# Patient Record
Sex: Male | Born: 1945
Health system: Southern US, Community
[De-identification: ages and names within clinical notes are randomized; demographics above are authoritative.]

## PROBLEM LIST (undated history)

## (undated) DIAGNOSIS — I2510 Atherosclerotic heart disease of native coronary artery without angina pectoris: Secondary | ICD-10-CM

## (undated) DIAGNOSIS — R51 Headache: Secondary | ICD-10-CM

## (undated) DIAGNOSIS — F319 Bipolar disorder, unspecified: Secondary | ICD-10-CM

## (undated) DIAGNOSIS — F32A Depression, unspecified: Secondary | ICD-10-CM

## (undated) DIAGNOSIS — J449 Chronic obstructive pulmonary disease, unspecified: Secondary | ICD-10-CM

## (undated) DIAGNOSIS — I519 Heart disease, unspecified: Secondary | ICD-10-CM

## (undated) DIAGNOSIS — R569 Unspecified convulsions: Secondary | ICD-10-CM

## (undated) DIAGNOSIS — I10 Essential (primary) hypertension: Secondary | ICD-10-CM

## (undated) DIAGNOSIS — F329 Major depressive disorder, single episode, unspecified: Secondary | ICD-10-CM

## (undated) DIAGNOSIS — I639 Cerebral infarction, unspecified: Secondary | ICD-10-CM

## (undated) DIAGNOSIS — I4891 Unspecified atrial fibrillation: Secondary | ICD-10-CM

## (undated) DIAGNOSIS — R519 Headache, unspecified: Secondary | ICD-10-CM

## (undated) HISTORY — PX: ANKLE FRACTURE SURGERY: SHX122

## (undated) HISTORY — DX: Major depressive disorder, single episode, unspecified: F32.9

## (undated) HISTORY — DX: Depression, unspecified: F32.A

## (undated) HISTORY — DX: Unspecified convulsions: R56.9

---

## 2001-03-27 ENCOUNTER — Ambulatory Visit (HOSPITAL_COMMUNITY): Admission: RE | Admit: 2001-03-27 | Discharge: 2001-03-27 | Payer: Self-pay | Admitting: Internal Medicine

## 2003-10-06 ENCOUNTER — Ambulatory Visit (HOSPITAL_COMMUNITY): Admission: RE | Admit: 2003-10-06 | Discharge: 2003-10-06 | Payer: Self-pay | Admitting: Orthopaedic Surgery

## 2003-10-19 ENCOUNTER — Ambulatory Visit (HOSPITAL_COMMUNITY): Admission: RE | Admit: 2003-10-19 | Discharge: 2003-10-19 | Payer: Self-pay | Admitting: Podiatry

## 2003-11-23 ENCOUNTER — Ambulatory Visit (HOSPITAL_COMMUNITY): Admission: RE | Admit: 2003-11-23 | Discharge: 2003-11-23 | Payer: Self-pay | Admitting: Podiatry

## 2005-01-16 ENCOUNTER — Ambulatory Visit: Payer: Self-pay | Admitting: *Deleted

## 2005-01-29 ENCOUNTER — Ambulatory Visit: Payer: Self-pay | Admitting: *Deleted

## 2005-01-29 ENCOUNTER — Encounter (HOSPITAL_COMMUNITY): Admission: RE | Admit: 2005-01-29 | Discharge: 2005-02-28 | Payer: Self-pay | Admitting: *Deleted

## 2005-02-04 ENCOUNTER — Ambulatory Visit: Payer: Self-pay | Admitting: *Deleted

## 2005-09-19 ENCOUNTER — Inpatient Hospital Stay (HOSPITAL_COMMUNITY): Admission: EM | Admit: 2005-09-19 | Discharge: 2005-09-25 | Payer: Self-pay | Admitting: Emergency Medicine

## 2005-09-20 ENCOUNTER — Ambulatory Visit: Payer: Self-pay | Admitting: Cardiovascular Disease

## 2005-09-20 ENCOUNTER — Encounter (INDEPENDENT_AMBULATORY_CARE_PROVIDER_SITE_OTHER): Payer: Self-pay | Admitting: Neurology

## 2005-09-20 ENCOUNTER — Ambulatory Visit: Payer: Self-pay | Admitting: Physical Medicine & Rehabilitation

## 2005-09-20 ENCOUNTER — Encounter: Payer: Self-pay | Admitting: Cardiovascular Disease

## 2005-09-25 ENCOUNTER — Inpatient Hospital Stay (HOSPITAL_COMMUNITY)
Admission: RE | Admit: 2005-09-25 | Discharge: 2005-10-04 | Payer: Self-pay | Admitting: Physical Medicine & Rehabilitation

## 2005-11-07 ENCOUNTER — Encounter (HOSPITAL_COMMUNITY): Admission: RE | Admit: 2005-11-07 | Discharge: 2005-12-07 | Payer: Self-pay | Admitting: Pulmonary Disease

## 2005-12-24 ENCOUNTER — Ambulatory Visit: Admission: RE | Admit: 2005-12-24 | Discharge: 2005-12-24 | Payer: Self-pay | Admitting: Pulmonary Disease

## 2006-04-10 ENCOUNTER — Ambulatory Visit (HOSPITAL_COMMUNITY): Admission: RE | Admit: 2006-04-10 | Discharge: 2006-04-10 | Payer: Self-pay | Admitting: Pulmonary Disease

## 2007-04-04 ENCOUNTER — Emergency Department (HOSPITAL_COMMUNITY): Admission: EM | Admit: 2007-04-04 | Discharge: 2007-04-04 | Payer: Self-pay | Admitting: Emergency Medicine

## 2007-10-05 ENCOUNTER — Ambulatory Visit (HOSPITAL_COMMUNITY): Payer: Self-pay | Admitting: Psychology

## 2007-10-15 ENCOUNTER — Ambulatory Visit (HOSPITAL_COMMUNITY): Payer: Self-pay | Admitting: Psychiatry

## 2007-10-19 ENCOUNTER — Ambulatory Visit (HOSPITAL_COMMUNITY): Payer: Self-pay | Admitting: Psychology

## 2007-11-24 ENCOUNTER — Ambulatory Visit (HOSPITAL_COMMUNITY): Payer: Self-pay | Admitting: Psychiatry

## 2007-12-08 ENCOUNTER — Ambulatory Visit (HOSPITAL_COMMUNITY): Payer: Self-pay | Admitting: Psychology

## 2007-12-29 ENCOUNTER — Ambulatory Visit (HOSPITAL_COMMUNITY): Payer: Self-pay | Admitting: Psychology

## 2008-01-21 ENCOUNTER — Ambulatory Visit (HOSPITAL_COMMUNITY): Payer: Self-pay | Admitting: Psychiatry

## 2008-03-17 ENCOUNTER — Ambulatory Visit (HOSPITAL_COMMUNITY): Payer: Self-pay | Admitting: Psychiatry

## 2008-03-24 ENCOUNTER — Ambulatory Visit (HOSPITAL_COMMUNITY): Payer: Self-pay | Admitting: Psychology

## 2008-05-10 ENCOUNTER — Ambulatory Visit (HOSPITAL_COMMUNITY): Payer: Self-pay | Admitting: Psychology

## 2008-06-10 ENCOUNTER — Ambulatory Visit (HOSPITAL_COMMUNITY): Payer: Self-pay | Admitting: Psychology

## 2008-06-16 ENCOUNTER — Ambulatory Visit (HOSPITAL_COMMUNITY): Payer: Self-pay | Admitting: Psychiatry

## 2008-07-25 ENCOUNTER — Ambulatory Visit (HOSPITAL_COMMUNITY): Payer: Self-pay | Admitting: Psychology

## 2008-08-26 ENCOUNTER — Ambulatory Visit (HOSPITAL_COMMUNITY): Payer: Self-pay | Admitting: Psychology

## 2008-09-13 ENCOUNTER — Ambulatory Visit (HOSPITAL_COMMUNITY): Payer: Self-pay | Admitting: Psychiatry

## 2008-11-10 ENCOUNTER — Ambulatory Visit (HOSPITAL_COMMUNITY): Payer: Self-pay | Admitting: Psychiatry

## 2008-11-30 ENCOUNTER — Ambulatory Visit: Payer: Self-pay | Admitting: Cardiology

## 2008-11-30 ENCOUNTER — Inpatient Hospital Stay (HOSPITAL_COMMUNITY): Admission: EM | Admit: 2008-11-30 | Discharge: 2008-12-04 | Payer: Self-pay | Admitting: Emergency Medicine

## 2008-11-30 ENCOUNTER — Encounter (INDEPENDENT_AMBULATORY_CARE_PROVIDER_SITE_OTHER): Payer: Self-pay | Admitting: Pulmonary Disease

## 2009-02-07 ENCOUNTER — Ambulatory Visit (HOSPITAL_COMMUNITY): Payer: Self-pay | Admitting: Psychiatry

## 2009-04-03 ENCOUNTER — Inpatient Hospital Stay (HOSPITAL_COMMUNITY): Admission: EM | Admit: 2009-04-03 | Discharge: 2009-04-05 | Payer: Self-pay | Admitting: Emergency Medicine

## 2009-04-13 ENCOUNTER — Ambulatory Visit (HOSPITAL_COMMUNITY): Payer: Self-pay | Admitting: Psychiatry

## 2009-04-20 DIAGNOSIS — M545 Low back pain: Secondary | ICD-10-CM

## 2009-04-20 DIAGNOSIS — F3162 Bipolar disorder, current episode mixed, moderate: Secondary | ICD-10-CM

## 2009-05-01 ENCOUNTER — Ambulatory Visit: Payer: Self-pay | Admitting: Cardiology

## 2009-05-01 DIAGNOSIS — I951 Orthostatic hypotension: Secondary | ICD-10-CM | POA: Insufficient documentation

## 2009-05-01 DIAGNOSIS — R55 Syncope and collapse: Secondary | ICD-10-CM | POA: Insufficient documentation

## 2009-05-01 DIAGNOSIS — R079 Chest pain, unspecified: Secondary | ICD-10-CM | POA: Insufficient documentation

## 2009-06-16 ENCOUNTER — Encounter: Payer: Self-pay | Admitting: Cardiology

## 2009-06-16 ENCOUNTER — Telehealth (INDEPENDENT_AMBULATORY_CARE_PROVIDER_SITE_OTHER): Payer: Self-pay

## 2009-06-30 ENCOUNTER — Ambulatory Visit (HOSPITAL_COMMUNITY): Admission: RE | Admit: 2009-06-30 | Discharge: 2009-06-30 | Payer: Self-pay | Admitting: Pulmonary Disease

## 2009-07-05 ENCOUNTER — Ambulatory Visit: Payer: Self-pay | Admitting: Gastroenterology

## 2009-07-05 ENCOUNTER — Observation Stay (HOSPITAL_COMMUNITY): Admission: AD | Admit: 2009-07-05 | Discharge: 2009-07-06 | Payer: Self-pay | Admitting: Pulmonary Disease

## 2009-07-05 DIAGNOSIS — D7389 Other diseases of spleen: Secondary | ICD-10-CM | POA: Insufficient documentation

## 2009-07-05 DIAGNOSIS — R6881 Early satiety: Secondary | ICD-10-CM | POA: Insufficient documentation

## 2009-07-05 DIAGNOSIS — I748 Embolism and thrombosis of other arteries: Secondary | ICD-10-CM | POA: Insufficient documentation

## 2009-07-05 DIAGNOSIS — R197 Diarrhea, unspecified: Secondary | ICD-10-CM

## 2009-07-05 DIAGNOSIS — R1012 Left upper quadrant pain: Secondary | ICD-10-CM | POA: Insufficient documentation

## 2009-07-05 DIAGNOSIS — R634 Abnormal weight loss: Secondary | ICD-10-CM

## 2009-07-05 DIAGNOSIS — Z860100 Personal history of colon polyps, unspecified: Secondary | ICD-10-CM | POA: Insufficient documentation

## 2009-07-06 ENCOUNTER — Encounter: Payer: Self-pay | Admitting: Gastroenterology

## 2009-07-12 ENCOUNTER — Emergency Department (HOSPITAL_COMMUNITY): Admission: EM | Admit: 2009-07-12 | Discharge: 2009-07-12 | Payer: Self-pay | Admitting: Emergency Medicine

## 2009-07-13 ENCOUNTER — Encounter: Payer: Self-pay | Admitting: Gastroenterology

## 2009-07-19 ENCOUNTER — Encounter: Payer: Self-pay | Admitting: Cardiology

## 2009-07-19 ENCOUNTER — Telehealth (INDEPENDENT_AMBULATORY_CARE_PROVIDER_SITE_OTHER): Payer: Self-pay

## 2009-07-27 ENCOUNTER — Encounter: Payer: Self-pay | Admitting: Cardiology

## 2009-07-28 LAB — CONVERTED CEMR LAB
ALT: 10 units/L (ref 0–53)
AST: 20 units/L (ref 0–37)
Albumin: 4.3 g/dL (ref 3.5–5.2)
Alkaline Phosphatase: 87 units/L (ref 39–117)
Bilirubin, Direct: 0.1 mg/dL (ref 0.0–0.3)
Cholesterol: 230 mg/dL — ABNORMAL HIGH (ref 0–200)
HDL: 42 mg/dL (ref >39)
Indirect Bilirubin: 0.6 mg/dL (ref 0.0–0.9)
LDL Cholesterol: 159 mg/dL — ABNORMAL HIGH (ref 0–99)
Total Bilirubin: 0.7 mg/dL (ref 0.3–1.2)
Total CHOL/HDL Ratio: 5.5
Total Protein: 7.5 g/dL (ref 6.0–8.3)
Triglycerides: 143 mg/dL (ref <150)
VLDL: 29 mg/dL (ref 0–40)

## 2009-09-01 ENCOUNTER — Emergency Department (HOSPITAL_COMMUNITY): Admission: EM | Admit: 2009-09-01 | Discharge: 2009-09-01 | Payer: Self-pay | Admitting: Emergency Medicine

## 2009-09-14 ENCOUNTER — Ambulatory Visit (HOSPITAL_COMMUNITY): Payer: Self-pay | Admitting: Psychiatry

## 2009-12-14 ENCOUNTER — Ambulatory Visit (HOSPITAL_COMMUNITY): Payer: Self-pay | Admitting: Psychiatry

## 2009-12-15 ENCOUNTER — Encounter (INDEPENDENT_AMBULATORY_CARE_PROVIDER_SITE_OTHER): Payer: Self-pay | Admitting: *Deleted

## 2009-12-18 ENCOUNTER — Encounter (INDEPENDENT_AMBULATORY_CARE_PROVIDER_SITE_OTHER): Payer: Self-pay

## 2010-01-27 ENCOUNTER — Encounter: Payer: Self-pay | Admitting: Internal Medicine

## 2010-02-06 NOTE — Progress Notes (Signed)
Summary: Labs?   Phone Note Outgoing Call   Call placed by: Larita Fife Via LPN,  July 19, 2009 3:40 PM Summary of Call: Second call concerning labwork. Called pt. concerning lab work that was to be done June 13 (rescheduled from April due to pt. not having performed at that time). He states he will have done if I mail lab orders to him again. Lab orders mailed to pt's home.  Initial call taken by: Larita Fife Via LPN,  July 19, 2009 3:53 PM

## 2010-02-06 NOTE — Letter (Signed)
Summary: Appointment - Reminder 2  [facility] [place]. [address], [place] [address]   Phone: [phone]  Fax: [phone]     December 15, 2009 MRN: [id number]   Jeffrey Sweeney [address] [place], [place]  [address]   Dear Mr. Riggins,  Our records indicate that it is time to schedule a follow-up appointment.  Dr.   Diona Browner       recommended that you follow up with [place] in  10/2009 PAST DUE          . It is very important that we reach you to schedule this appointment. We look forward to participating in your health care needs. Please contact us at the number listed above at your earliest convenience to schedule your appointment.  If you are unable to make an appointment at this time, give [place] a call so we can update our records.     Sincerely,   Glass blower/designer

## 2010-02-06 NOTE — Letter (Signed)
Summary: referral from dr ed Juanetta Gosling  referral from dr ed Juanetta Gosling   Imported By: Rosine Beat 07/13/2009 15:15:27  _____________________________________________________________________  External Attachment:    Type:   Image     Comment:   External Document

## 2010-02-06 NOTE — Assessment & Plan Note (Signed)
Summary: abdominal pain- cdg   Visit Type:  consult Referring Provider:  Shaune Pollack Primary Care Provider:  Shaune Pollack  Chief Complaint:  abd pain and no appitite.  History of Present Illness: Jeffrey Sweeney is a pleasant 65 y/o WM, patient of Dr. Juanetta Gosling, who presents for further evaluation of abd pain, diarrhea, weight loss, anorexia. He states he has lost over 80 pounds this year. He c/o chronic anorexia and early satiety. He has two month h/o luq abd pain. This is associated with intermittent luq abd cramps, pp loose stools. No N/V. Recent heartburn. Taking TUMS, Pepto helps temporarily. No melena, brbpr. No fever. Pain intermittent but not necessarily related to meals.  No dyphagia.   Last TCS, 2003 at [facility], had polyps. Remote EGD, Dr. Karilyn Cota.   CT A/P 06/30/09-->nonobstruction bilateral renal calculi, bilateral renal cysts (tiny). Splenic infarct with splenic and right portal vein thrombosis, celiac artery thrombosis including celiac trunk, common hepatic and splenic arteries. Small sized of thrombosed celiac artery s/o subacute occlusion.   Current Medications (verified): 1)  Nardil 15 Mg Tabs (Phenelzine Sulfate) .... Take 2 Tabs Three Times A Day 2)  Simvastatin 20 Mg Tabs (Simvastatin) .... Take 1 Tab Daily 3)  Abilify 10 Mg Tabs (Aripiprazole) .... Take 1 Tab Daily 4)  Lunesta 3 Mg Tabs (Eszopiclone) .... Take 1 Tab At Bedtime 5)  Lortab 5-500 Mg Tabs (Hydrocodone-Acetaminophen) .... Take 1 Tab As Needed For Pain 6)  Alprazolam 1 Mg Tabs (Alprazolam) .... Take As Directed 7)  Midodrine Hcl 10 Mg Tabs (Midodrine Hcl) .... Take 1 Tab Three Times A Day 8)  Asa (?dose) .... One Daily  Allergies (verified): 1)  ! Demerol  Past History:  Past Medical History: Symptomatic orthostatic hypotension Hypertension Cerebrovascular Disease s/p stroke with left hemiparesis Anxiety Depression/Bipolar D/O Chronic back pain, bulging disc Syncope MRI brain 3/11-->Chronic ischemic changes  including a chronic right parietal and temporal lobe infarct.  Past Surgical History: Lipoma excision Ankle surgery, right, fracture Knee surgery, bilateral, ligament tear  Family History: Noncontributory Mother and Father, "intestinal cancer" >13 years old  Social History: Disabled  Tobacco Use - Former Alcohol Use - no Widowed. Five children from three wives.  Review of Systems General:  Complains of weakness and weight loss; denies fever, chills, sweats, anorexia, and fatigue. Eyes:  Denies vision loss. ENT:  Denies nasal congestion, sore throat, hoarseness, and difficulty swallowing. CV:  Denies chest pains, angina, palpitations, dyspnea on exertion, and peripheral edema. Resp:  Denies dyspnea at rest, dyspnea with exercise, cough, sputum, and wheezing. GI:  See HPI. GU:  Denies urinary burning and blood in urine. MS:  Complains of low back pain and muscle weakness. Derm:  Denies rash and itching. Neuro:  Complains of weakness, abnormal sensation, and memory loss; denies frequent headaches and confusion; related to previous cva. Psych:  Complains of depression, anxiety, and memory loss; denies suicidal ideation, hallucinations, and confusion. Endo:  Complains of unusual weight change. Heme:  Denies bruising and bleeding. Allergy:  Denies hives and rash.  Vital Signs:  Patient profile:   65 year old male Height:      68 inches Weight:      202 pounds BMI:     30.83 Temp:     98.6 degrees F oral Pulse rate:   60 / minute BP sitting:   104 / 60  (right arm) Cuff size:   regular  Vitals Entered By: Hendricks Limes LPN (July 05, 2009 10:20 AM)  Physical Exam  General:  Well developed, well nourished, no acute distress. Head:  Normocephalic and atraumatic. Eyes:  sclera nonicteric Mouth:  Oropharyngeal mucosa moist, pink.  No lesions, erythema or exudate.    Neck:  Supple; no masses or thyromegaly. Lungs:  Clear throughout to auscultation. Heart:  Regular rate and  rhythm; no murmurs, rubs,  or bruits. Abdomen:  Obese. Positive bs. Soft. Mod tenderness in epig/luq. No rebound or guarding. No abd hernia. No HSM or masses.  Extremities:  No clubbing, cyanosis, edema or deformities noted. Neurologic:  Alert and  oriented x4;  grossly normal neurologically. Skin:  Intact without significant lesions or rashes. Cervical Nodes:  No significant cervical adenopathy. Psych:  Alert and cooperative. Normal mood and affect.poor memory.    Impression & Recommendations:  Problem # 1:  EMBOLISM&THROMBOSIS OF OTHER SPECIFIED ARTERY (ICD-444.89) Patient has splenic and right portal vein thrombosis along with celiac artery thrombosis involving celiac trunk, common hepatic and splenic arteries. Probably subacute process. Splenic infarct as well. It is likely that his symptoms of weight loss, diarrhea, anorexia, luq pain all explained by these findings. Discussed with Dr. Darrick Penna. Patient needs to be admitted for anticoagulation therapy and hematology consultation. Dr. Darrick Penna discussed with interventional radiology who did not feel the celiac artery could be addressed by IR or vascular surgery. See Dr. Darrick Penna addendum below. Patient has been informed to go to ED at [facility] for management. Labs were planned as outpatient, but now can be done inpatient.  Orders: Consultation Level IV ([address]315) T-Comprehensive Metabolic Panel [phone]) T-CBC w/Diff ([id number]) T-PT (Prothrombin Time) (80998)  ADDENDUM [id number] 1200-Spoke with Dr. Juanetta Gosling. Informed him pt will be sent to the ED and needs anticoagulation +/- embolectomy by Vascular surgery or interventional radiologist. Spoke with Dr. Denny Levy and felt unable to intervene on celiac artery. Pt would benefit from anticoagulation but not intervention by IR or vascular surgery. Needs Hematology evaluation.  Problem # 2:  PERSONAL HISTORY OF COLONIC POLYPS (ICD-V12.72) Will plan on TCS in few months after acute issues resolved.   Orders: Consultation Level IV (05397)  Problem # 3:  GERD (ICD-530.81) Nexium 40 mg by mouth daily, #20 samples provided. If he has persistent anorexia, weight loss after treatment of thrombosis, consider EGD.  Orders: Consultation Level IV [phone])  Other Orders: T-Lipase [id number]) T-Amylase [phone]) T-TSH [id number]) I would like to thank Dr. Juanetta Gosling for allowing [place] to take part in the care of this nice patient.  Appended Document: abdominal pain- cdg TCS in 6 mos w/ propofol.  Appended Document: abdominal pain- cdg Please NIC for TCS+/-EGD in 6 months with RMR ?in OR.   Appended Document: abdominal pain- cdg REMINDER IN COMPUTER

## 2010-02-06 NOTE — Letter (Signed)
Summary: [facility]  [address] [place] [address]   Phone: [phone]  Fax: [phone]     June 16, 2009 MRN: [id number]   Jeffrey Sweeney [address] [place], [place]  [address]      YOUR LAB WORK IS DUE  THE WEEK OF 06-19-09 _________________________________________  Please go to [facility], located across the street from [facility] on the second floor.  Hours are Monday - Friday 7am until 7:30pm         Saturday 8am until 12noon    _x_  DO NOT EAT OR DRINK AFTER MIDNIGHT EVENING PRIOR TO LABWORK  __ YOUR LABWORK IS NOT FASTING --YOU MAY EAT PRIOR TO LABWORK

## 2010-02-06 NOTE — Letter (Signed)
Summary: [facility]  [address] [place] [address]   Phone: [phone]  Fax: [phone]     July 19, 2009 MRN: [id number]   CRAVEN CREAN [address] [place], [place]  [address]      YOUR LAB WORK IS DUE  The week of July 24, 2009 _________________________________________  Please go to [facility], located across the street from [facility] on the second floor.  Hours are Monday - Friday 7am until 7:30pm         Saturday 8am until 12noon    _X_  DO NOT EAT OR DRINK AFTER MIDNIGHT EVENING PRIOR TO LABWORK  __ YOUR LABWORK IS NOT FASTING --YOU MAY EAT PRIOR TO LABWORK

## 2010-02-06 NOTE — Letter (Signed)
Summary: [place] [facility]read[facility] (Nuc Med Stress)  [facility]  [address], [place] [address]   Phone: [phone]  Fax: [phone]    Nuclear Medicine 1-Day Stress Test Information Sheet  Re:     Jeffrey Sweeney   DOB:     Oct 28, 1945 MRN:     [id number] Weight:  Appointment Date: Register at: Appointment Time: Referring MD:  ___Exercise Stress  __Adenosine   __Dobutamine  _X_Lexiscan  __Persantine   __Thallium  Urgency: ____1 (next day)   ____2 (one week)    ____3 (PRN)  Patient will receive Follow Up call with results: Patient needs follow-up appointment:  Instructions regarding medication:  How to prepare for your stress test: 1. DO NOT eat or dring 6 hours prior to your arrival time. This includes no caffeine (coffee, tea, sodas, chocolate) if you were instructed to take your medications, drink water with it. 2. DO NOT use any tobacco products for at leaset 8 hours prior to arrival. 3. DO NOT wear dresses or any clothing that may have metal clasps or buttons. 4. Wear short sleeve shirts, loose clothing, and comfortalbe walking shoes. 5. DO NOT use lotions, oils or powder on your chest before the test. 6. The test will take approximately 3-4 hours from the time you arrive until completion. 7. To register the day of the test, go to the Short Stay entrance at [facility]. 8. If you must cancel your test, call [phone] as soon as you are aware.  After you arrive for test:   When you arrive at [facility], you will go to Short Stay to be registered. They will then send you to Radiology to check in. The Nuclear Medicine Tech will get you and start an IV in your arm or hand. A small amount of a radioactive tracer will then be injected into your IV. This tracer will then have to circulate for 30-45 minutes. During this time you will wait in the waiting room and you will be able to drink something without caffeine. A series of pictures will be taken  of your heart follwoing this waiting period. After the 1st set of pictures you will go to the stress lab to get ready for your stress test. During the stress test, another small amount of a radioactive tracer will be injected through your IV. When the stress test is complete, there is a short rest period while your heart rate and blood pressure will be monitored. When this monitoring period is complete you will have another set of pictrues taken. (The same as the 1st set of pictures). These pictures are taken between 15 minutes and 1 hour after the stress test. The time depends on the type of stress test you had. Your doctor will inform you of your test results within 7 days after test.    The possibilities of certain changes are possible during the test. They include abnormal blood pressure and disorders of the heart. Side effects of persantine or adenosine can include flushing, chest pain, shortness of breath, stomach tightness, headache and light-headedness. These side effects usually do not last long and are self-resolving. Every effort will be made to keep you comfortable and to minimize complications by obtaining a medical history and by close observation during the test. Emergency equipment, medications, and trained personnel are available to deal with any unusual situation which may arise.  Please notify office at least 48 hours in advance if you are unable to keep  this appt.

## 2010-02-06 NOTE — Assessment & Plan Note (Signed)
Summary: PER PT FOR SYNCOPE/TG   Visit Type:  Initial Consult Primary Provider:  Dr. Kari Baars   History of Present Illness: 65 year old male presents for a cardiology consultation. He has been seen in the past by Dr. Myrtis Ser and also Dr. Daleen Squibb with history of orthostatic hypotension. He was recently hospitalized in late March with an episode of syncope as well as seizure activity, with documentation of orthostasis. Neurology consultation was undertaken at that point. EEG was reportedly negative. He has a prior history of stroke with residual left arm weakness, and followup MRI from March 28 revealed no acute infarct. He did have chronic ischemic changes including a chronic right parietal and temporal lobe infarct. Midodrine dose was increased, and he denies having any syncope since discharge.  His discharge summary indicates coronary artery disease, however I do not see this formally documented in his record. Previous Myoview from January 2007 was low risk without any clear evidence of ischemia or infarct. LVEF as of November 2010 was 60-65% without regional wall motion abnormalities. The patient states he had a cardiac catheterization years ago, although I cannot locate any formal report. He does indicate occasional chest pain, and states that he has been using nitroglycerin since the 1980s.  The patient is here with his son today. After a discussion with them, it is apparent that they are essentially seeking a second opinion regarding his chronic medical conditions. We focused only on his cardiac issues.  I explained the general mechanism and implications of orthostatic hypotension. He most likely has autonomic dysfunction, exacerbated by his cerebrovascular disease and possibly a variety of different medications over the years.  Current Medications (verified): 1)  Nardil 15 Mg Tabs (Phenelzine Sulfate) .... Take 2 Tabs Three Times A Day 2)  Simvastatin 20 Mg Tabs (Simvastatin) .... Take 1 Tab  Daily 3)  Abilify 10 Mg Tabs (Aripiprazole) .... Take 1 Tab Daily 4)  Lunesta 3 Mg Tabs (Eszopiclone) .... Take 1 Tab At Bedtime 5)  Lortab 5-500 Mg Tabs (Hydrocodone-Acetaminophen) .... Take 1 Tab As Needed For Pain 6)  Alprazolam 1 Mg Tabs (Alprazolam) .... Take As Directed 7)  Midodrine Hcl 10 Mg Tabs (Midodrine Hcl) .... Take 1 Tab Three Times A Day  Allergies (verified): No Known Drug Allergies  Past History:  Past Medical History: Last updated: 04/28/2009 Symptomatic orthostatic hypotension Hypertension Cerebrovascular Disease s/p stroke with left hemiparesis Anxiety Depression Chronic back pain Syncope  Past Surgical History: Last updated: 04/28/2009 Lipoma excision Ankle surgery Knee surgery  Family History: Last updated: 04/28/2009 Noncontributory  Social History: Last updated: 04/28/2009 Disabled  Tobacco Use - Former Alcohol Use - no Widowed  Review of Systems  The patient denies anorexia, fever, peripheral edema, prolonged cough, headaches, hemoptysis, melena, and hematochezia.         Reported long-term, intermittent history of chest pain. Residual left arm weakness. Stable appetite. No palpitations. No orthopnea. Otherwise reviewed and negative.  Clinical Review Panels:  Echocardiogram Echocardiogram   Study Conclusions    1. Left ventricle: The cavity size was normal. Wall thickness was      increased in a pattern of mild LVH. Systolic function was normal.      The estimated ejection fraction was in the range of 60% to 65%.      Wall motion was normal; there were no regional wall motion      abnormalities.   2. Aortic valve: Mildly calcified annulus.   3. Mitral valve: Mildly to moderately calcified annulus.  4. Right ventricle: The cavity size was mildly dilated. Wall      thickness was normal.   Impressions:    - Compared to the prior study of 09/20/05, there has been no     significant interval change.   Transthoracic echocardiography.  M-mode, complete 2D, spectral   Doppler, and color Doppler. Patient status: Inpatient. Location:   ICU/CCU (11/30/2008)    Vital Signs:  Patient profile:   65 year old male Height:      68 inches Weight:      214 pounds BMI:     32.66 Pulse rate:   97 / minute BP sitting:   131 / 79  (right arm)  Vitals Entered By: Dreama Saa, CNA (May 01, 2009 2:17 PM)  Physical Exam  Additional Exam:  Chronically ill-appearing obese male in no acute distress. HEENT: Conjunctiva and lids normal, oropharynx with poor dentition. Neck: Supple, no elevated JVP or loud bruits. Lungs: Clear to auscultation, diminished, nonlabored. Cardiac: Regular rate and rhythm, soft S4 and soft systolic murmur, no pericardial rub. Abdomen: Soft, nontender, obese. Bowel sounds present. Skin: Warm and dry. Musculoskeletal: No gross deformities. Extremities: No pitting edema, distal pulses one plus. Neuropsychiatric: Alert and oriented x3, affect somewhat flat, 3/5 strength left arm.   EKG  Procedure date:  05/01/2009  Findings:      Normal sinus rhythm 97 beats per minute with vertical axis.  Nuclear Study  Procedure date:  01/29/2005  Findings:       RESULTS:  There is uniform perfusion throughout all myocardial   segments.  There is no evidence of ischemia or scar.  The overall   ejection fraction is 59%.   STRESS TEST:  The patient exercised 5 minutes and 3 seconds of a   Bruce protocol, attaining 7 mets of exercise.  His heart rate went   from 78 beats per minute to 126 beats per minute, which is only 78%   of his max predicted heart rate for his age.  His blood pressure went   from 128/70 to 182/68.  During that time, he had intermittent, sharp   chest discomfort with no arrhythmia or ischemic ST-T wave changes.   He stopped the treadmill secondary to leg fatigue on his own.  He   would not exercise further.   IMPRESSION:   This is a low risk scan, although somewhat compromised by the  low   activity level. There does not appear to be any significant   epicardial coronary disease and LV systolic function is normal.  Impression & Recommendations:  Problem # 1:  CORONARY ATHEROSCLEROSIS NATIVE CORONARY ARTERY (ICD-414.01)  Reported history of CAD, although not well documented. I cannot locate any old cardiac catheterization report, and in fact his Myoview from 2007 was low risk. LVEF recently documented in the normal range. He does report a long-standing history of chest pain and use of nitroglycerin. his electrocardiogram is nonspecific. He has not undergone any interval ischemic evaluation. We plan a followup Lexiscan Myoview.  Problem # 2:  ORTHOSTATIC HYPOTENSION (ICD-458.0)  Fairly well documented, and perhaps worse over the last several months. He has had syncope and seizure like activity associated with this. At this point his orthostatic vital signs are reasonable, and he has not had any syncope since hospital discharge on increased dose Midodrine. We also discussed the use of lower extremity compression stockings, and also potentially Florinef if needed. I plan to see him back in 6 months for routine evaluation,  sooner if needed.  Problem # 3:  HYPERLIPIDEMIA (ICD-272.4)  Followup lipid profile and liver function tests will be obtained.  His updated medication list for this problem includes:    Simvastatin 20 Mg Tabs (Simvastatin) .Marland Kitchen... Take 1 tab daily  Orders: T-Lipid Profile ([id number]) T-Hepatic Function [phone])  Problem # 4:  BIPOLAR AFFECTIVE DISORDER, HX OF (ICD-V11.8)  It could be that some of the medications used for treatment over time have contributed to his orthostasis.  Problem # 5:  CEREBROVASCULAR DISEASE (ICD-437.9)  Documented prior history of stroke. Patient likely has an element of autonomic dysfunction. I recommended that they consider routine followup with a neurologist for surveillance.  Other Orders: Nuclear Stress Test (Nuc  Stress Test)  Patient Instructions: 1)  Your physician recommends that you schedule a follow-up appointment in: 6 months 2)  Your physician recommends that you return for lab work in: This week 3)  Your physician recommends that you continue on your current medications as directed. Please refer to the Current Medication list given to you today. 4)  Your physician has requested that you have an [employer]  For further information please visit [email or web address]  Please follow instruction sheet, as given.

## 2010-02-06 NOTE — Letter (Signed)
Summary: Appointment - Reminder 2  [facility]. [address]   [place], [place] [address]   Phone: [phone]  Fax: [phone]     December 15, 2009 MRN: [id number]   Jeffrey Sweeney [address] [place], [place]  [address]   Dear [name],  Our records indicate that it is time to schedule a follow-up appointment.  Dr. Diona Browner        recommended that you follow up with [place] in    10.2011 [place]        . It is very important that we reach you to schedule this appointment. We look forward to participating in your health care needs. Please contact us at the number listed above at your earliest convenience to schedule your appointment.  If you are unable to make an appointment at this time, give [place] a call so we can update our records.     Sincerely,   Glass blower/designer

## 2010-02-06 NOTE — Letter (Signed)
Summary: Internal Other  Internal Other   Imported By: Peggyann Shoals 07/06/2009 13:52:55  _____________________________________________________________________  External Attachment:    Type:   Image     Comment:   External Document

## 2010-02-06 NOTE — Progress Notes (Signed)
**Note De-Identified Mackensie Pilson Obfuscation** Summary: Lab work?  Phone Note Outgoing Call   Call placed by: Larita Fife Willena Jeancharles LPN,  June 16, 2009 10:13 AM Summary of Call: [facility], pt. did not have lab work completed that was ordered on 05-01-09 OV. Initial call taken by: Larita Fife Drenda Sobecki LPN,  June 16, 2009 10:14 AM  Follow-up for Phone Call        Pt. did not have lab work done. Lab orders mailed to pt. and he states he will have done. Follow-up by: Larita Fife Masiah Lewing LPN,  June 16, 2009 1:33 PM

## 2010-02-07 ENCOUNTER — Other Ambulatory Visit (HOSPITAL_COMMUNITY): Payer: Self-pay | Admitting: Oncology

## 2010-02-07 ENCOUNTER — Encounter (HOSPITAL_COMMUNITY): Payer: Medicare Other | Attending: Oncology

## 2010-02-07 ENCOUNTER — Ambulatory Visit (HOSPITAL_COMMUNITY): Payer: PRIVATE HEALTH INSURANCE | Admitting: Oncology

## 2010-02-07 DIAGNOSIS — C801 Malignant (primary) neoplasm, unspecified: Secondary | ICD-10-CM

## 2010-02-07 DIAGNOSIS — D6859 Other primary thrombophilia: Secondary | ICD-10-CM | POA: Insufficient documentation

## 2010-02-07 DIAGNOSIS — Z1289 Encounter for screening for malignant neoplasm of other sites: Secondary | ICD-10-CM | POA: Insufficient documentation

## 2010-02-07 DIAGNOSIS — Z86718 Personal history of other venous thrombosis and embolism: Secondary | ICD-10-CM | POA: Insufficient documentation

## 2010-02-07 DIAGNOSIS — Z125 Encounter for screening for malignant neoplasm of prostate: Secondary | ICD-10-CM | POA: Insufficient documentation

## 2010-02-07 DIAGNOSIS — Z800 Family history of malignant neoplasm of digestive organs: Secondary | ICD-10-CM | POA: Insufficient documentation

## 2010-02-07 DIAGNOSIS — Z1211 Encounter for screening for malignant neoplasm of colon: Secondary | ICD-10-CM | POA: Insufficient documentation

## 2010-02-07 DIAGNOSIS — Z8673 Personal history of transient ischemic attack (TIA), and cerebral infarction without residual deficits: Secondary | ICD-10-CM | POA: Insufficient documentation

## 2010-02-07 LAB — COMPREHENSIVE METABOLIC PANEL
ALT: 10 U/L (ref 0–53)
ALT: 10 U/L (ref 0–53)
AST: 23 U/L (ref 0–37)
AST: 23 U/L (ref 0–37)
Albumin: 3.9 g/dL (ref 3.5–5.2)
Albumin: 3.9 g/dL (ref 3.5–5.2)
Alkaline Phosphatase: 79 U/L (ref 39–117)
Alkaline Phosphatase: 79 U/L (ref 39–117)
BUN: 12 mg/dL (ref 6–23)
BUN: 12 mg/dL (ref 6–23)
CO2: 27 mEq/L (ref 19–32)
CO2: 27 mEq/L (ref 19–32)
Calcium: 9.2 mg/dL (ref 8.4–10.5)
Calcium: 9.2 mg/dL (ref 8.4–10.5)
Chloride: 103 mEq/L (ref 96–112)
Chloride: 103 mEq/L (ref 96–112)
Creatinine, Ser: 1.3 mg/dL (ref 0.4–1.5)
Creatinine, Ser: 1.3 mg/dL (ref 0.4–1.5)
GFR calc Af Amer: >60 mL/min (ref >60)
GFR calc Af Amer: >60 mL/min (ref >60)
GFR calc non Af Amer: 56 mL/min — ABNORMAL LOW (ref >60)
GFR calc non Af Amer: 56 mL/min — ABNORMAL LOW (ref >60)
Glucose, Bld: 101 mg/dL — ABNORMAL HIGH (ref 70–99)
Glucose, Bld: 101 mg/dL — ABNORMAL HIGH (ref 70–99)
Potassium: 3.4 mEq/L — ABNORMAL LOW (ref 3.5–5.1)
Potassium: 3.4 mEq/L — ABNORMAL LOW (ref 3.5–5.1)
Sodium: 139 mEq/L (ref 135–145)
Sodium: 139 mEq/L (ref 135–145)
Total Bilirubin: 0.8 mg/dL (ref 0.3–1.2)
Total Bilirubin: 0.8 mg/dL (ref 0.3–1.2)
Total Protein: 7.4 g/dL (ref 6.0–8.3)
Total Protein: 7.4 g/dL (ref 6.0–8.3)

## 2010-02-07 LAB — CBC
HCT: 49.4 % (ref 39.0–52.0)
HCT: 49.4 % (ref 39.0–52.0)
Hemoglobin: 17.4 g/dL — ABNORMAL HIGH (ref 13.0–17.0)
Hemoglobin: 17.4 g/dL — ABNORMAL HIGH (ref 13.0–17.0)
MCH: 32.8 pg (ref 26.0–34.0)
MCH: 32.8 pg (ref 26.0–34.0)
MCHC: 35.2 g/dL (ref 30.0–36.0)
MCHC: 35.2 g/dL (ref 30.0–36.0)
MCV: 93.2 fL (ref 78.0–100.0)
Platelets: 242 10*3/uL (ref 150–400)
RBC: 5.3 MIL/uL (ref 4.22–5.81)
RDW: 15 % (ref 11.5–15.5)
WBC: 10.1 10*3/uL (ref 4.0–10.5)

## 2010-02-07 LAB — DIFFERENTIAL
Basophils Absolute: 0.1 10*3/uL (ref 0.0–0.1)
Basophils Relative: 1 % (ref 0–1)
Eosinophils Absolute: 0.5 10*3/uL (ref 0.0–0.7)
Eosinophils Relative: 5 % (ref 0–5)
Lymphocytes Relative: 34 % (ref 12–46)
Lymphocytes Relative: 34 % (ref 12–46)
Lymphs Abs: 3.5 10*3/uL (ref 0.7–4.0)
Monocytes Absolute: 0.7 10*3/uL (ref 0.1–1.0)
Monocytes Absolute: 0.7 10*3/uL (ref 0.1–1.0)
Monocytes Relative: 7 % (ref 3–12)
Monocytes Relative: 7 % (ref 3–12)
Neutro Abs: 5.4 10*3/uL (ref 1.7–7.7)
Neutro Abs: 5.4 10*3/uL (ref 1.7–7.7)
Neutrophils Relative %: 53 % (ref 43–77)

## 2010-02-07 LAB — PROTIME-INR
INR: 0.89 (ref 0.00–1.49)
INR: 0.89 (ref 0.00–1.49)
Prothrombin Time: 12.3 seconds (ref 11.6–15.2)
Prothrombin Time: 12.3 seconds (ref 11.6–15.2)

## 2010-02-08 ENCOUNTER — Other Ambulatory Visit (HOSPITAL_COMMUNITY): Payer: PRIVATE HEALTH INSURANCE

## 2010-02-08 ENCOUNTER — Ambulatory Visit (HOSPITAL_COMMUNITY)
Admission: RE | Admit: 2010-02-08 | Discharge: 2010-02-08 | Disposition: A | Discharge status: Home / Self Care | Payer: Medicare Other | Source: Ambulatory Visit | Attending: Oncology | Admitting: Oncology

## 2010-02-08 DIAGNOSIS — R0789 Other chest pain: Secondary | ICD-10-CM | POA: Insufficient documentation

## 2010-02-08 DIAGNOSIS — Q619 Cystic kidney disease, unspecified: Secondary | ICD-10-CM | POA: Insufficient documentation

## 2010-02-08 DIAGNOSIS — Z86718 Personal history of other venous thrombosis and embolism: Secondary | ICD-10-CM | POA: Insufficient documentation

## 2010-02-08 DIAGNOSIS — N200 Calculus of kidney: Secondary | ICD-10-CM | POA: Insufficient documentation

## 2010-02-08 DIAGNOSIS — C801 Malignant (primary) neoplasm, unspecified: Secondary | ICD-10-CM

## 2010-02-08 DIAGNOSIS — Z7901 Long term (current) use of anticoagulants: Secondary | ICD-10-CM | POA: Insufficient documentation

## 2010-02-08 DIAGNOSIS — R109 Unspecified abdominal pain: Secondary | ICD-10-CM | POA: Insufficient documentation

## 2010-02-08 DIAGNOSIS — F17200 Nicotine dependence, unspecified, uncomplicated: Secondary | ICD-10-CM | POA: Insufficient documentation

## 2010-02-08 LAB — BETA-2-GLYCOPROTEIN I ABS, IGG/M/A
Beta-2 Glyco I IgG: 0 G Units (ref <20)
Beta-2-Glycoprotein I IgM: 9 M Units (ref <20)

## 2010-02-08 LAB — PROTEIN C ACTIVITY: Protein C Activity: 114 % (ref 75–133)

## 2010-02-08 LAB — ANTITHROMBIN III: AntiThromb III Func: 92 % (ref 76–126)

## 2010-02-08 LAB — LUPUS ANTICOAGULANT PANEL
DRVVT: 37.2 secs (ref 36.2–44.3)
Lupus Anticoagulant: NOT DETECTED

## 2010-02-08 LAB — CARDIOLIPIN ANTIBODIES, IGG, IGM, IGA: Anticardiolipin IgM: 7 MPL U/mL — ABNORMAL LOW (ref <11)

## 2010-02-08 LAB — CEA: CEA: 1.8 ng/mL (ref 0.0–5.0)

## 2010-02-08 MED ORDER — IOHEXOL 300 MG/ML  SOLN
100.0000 mL | Freq: Once | INTRAMUSCULAR | Status: AC | PRN
  Administered 2010-02-08: 100 mL via INTRAVENOUS

## 2010-02-08 NOTE — Letter (Signed)
Summary: Recall Colonoscopy/Endoscopy, Change to Office Visit  [facility]  [address]   [place], [place] [address]   Phone: [phone]  Fax: [phone]      December 18, 2009   Jeffrey Sweeney [address] [place], [place]  [address] 1945-01-31   Dear Mr. Welter,   According to our records, it is time for you to schedule a Colonoscopy/Endoscopy. However, after reviewing your medical record, we recommend an office visit in order to determine your need for a repeat procedure.  Please call [phone] at your convenience to schedule an office visit. If you have any questions or concerns, please feel free to contact our office.   Sincerely,   Cloria Spring LPN  [facility] Ph: [phone]   Fax: [phone]

## 2010-02-09 ENCOUNTER — Ambulatory Visit (HOSPITAL_COMMUNITY): Payer: PRIVATE HEALTH INSURANCE | Admitting: Oncology

## 2010-02-09 DIAGNOSIS — D6859 Other primary thrombophilia: Secondary | ICD-10-CM

## 2010-02-10 LAB — PROTEIN S, TOTAL: Protein S Ag, Total: 77 % (ref 70–140)

## 2010-02-12 LAB — FACTOR 5 LEIDEN

## 2010-02-12 LAB — PROTHROMBIN GENE MUTATION

## 2010-02-13 ENCOUNTER — Encounter (INDEPENDENT_AMBULATORY_CARE_PROVIDER_SITE_OTHER): Payer: PRIVATE HEALTH INSURANCE | Admitting: Psychiatry

## 2010-02-13 DIAGNOSIS — F331 Major depressive disorder, recurrent, moderate: Secondary | ICD-10-CM

## 2010-02-14 ENCOUNTER — Other Ambulatory Visit (HOSPITAL_COMMUNITY): Payer: Self-pay | Admitting: Oncology

## 2010-02-27 ENCOUNTER — Ambulatory Visit (INDEPENDENT_AMBULATORY_CARE_PROVIDER_SITE_OTHER): Payer: Medicare Other | Admitting: Internal Medicine

## 2010-02-27 DIAGNOSIS — D689 Coagulation defect, unspecified: Secondary | ICD-10-CM

## 2010-02-27 DIAGNOSIS — Z7982 Long term (current) use of aspirin: Secondary | ICD-10-CM

## 2010-02-27 DIAGNOSIS — Z7901 Long term (current) use of anticoagulants: Secondary | ICD-10-CM

## 2010-03-08 ENCOUNTER — Other Ambulatory Visit (INDEPENDENT_AMBULATORY_CARE_PROVIDER_SITE_OTHER): Payer: Self-pay | Admitting: Internal Medicine

## 2010-03-08 ENCOUNTER — Encounter (HOSPITAL_BASED_OUTPATIENT_CLINIC_OR_DEPARTMENT_OTHER): Payer: Medicare Other | Admitting: Internal Medicine

## 2010-03-08 ENCOUNTER — Ambulatory Visit (HOSPITAL_COMMUNITY)
Admission: RE | Admit: 2010-03-08 | Discharge: 2010-03-08 | Disposition: A | Discharge status: Home / Self Care | Payer: Medicare Other | Source: Ambulatory Visit | Attending: Internal Medicine | Admitting: Internal Medicine

## 2010-03-08 DIAGNOSIS — D6859 Other primary thrombophilia: Secondary | ICD-10-CM

## 2010-03-08 DIAGNOSIS — D126 Benign neoplasm of colon, unspecified: Secondary | ICD-10-CM

## 2010-03-08 DIAGNOSIS — K259 Gastric ulcer, unspecified as acute or chronic, without hemorrhage or perforation: Secondary | ICD-10-CM

## 2010-03-08 DIAGNOSIS — A048 Other specified bacterial intestinal infections: Secondary | ICD-10-CM | POA: Insufficient documentation

## 2010-03-08 DIAGNOSIS — K257 Chronic gastric ulcer without hemorrhage or perforation: Secondary | ICD-10-CM | POA: Insufficient documentation

## 2010-03-08 DIAGNOSIS — Z800 Family history of malignant neoplasm of digestive organs: Secondary | ICD-10-CM | POA: Insufficient documentation

## 2010-03-08 DIAGNOSIS — Z7901 Long term (current) use of anticoagulants: Secondary | ICD-10-CM

## 2010-03-08 DIAGNOSIS — K5730 Diverticulosis of large intestine without perforation or abscess without bleeding: Secondary | ICD-10-CM | POA: Insufficient documentation

## 2010-03-08 DIAGNOSIS — I749 Embolism and thrombosis of unspecified artery: Secondary | ICD-10-CM

## 2010-03-22 LAB — CBC
Hemoglobin: 16.2 g/dL (ref 13.0–17.0)
MCHC: 32.9 g/dL (ref 30.0–36.0)
Platelets: 241 10*3/uL (ref 150–400)

## 2010-03-22 LAB — POCT CARDIAC MARKERS
CKMB, poc: 1.2 ng/mL (ref 1.0–8.0)
CKMB, poc: <1 ng/mL — ABNORMAL LOW (ref 1.0–8.0)
Myoglobin, poc: 32.5 ng/mL (ref 12–200)
Myoglobin, poc: 50.9 ng/mL (ref 12–200)

## 2010-03-22 LAB — DIFFERENTIAL
Eosinophils Absolute: 0.5 10*3/uL (ref 0.0–0.7)
Eosinophils Relative: 5 % (ref 0–5)
Lymphocytes Relative: 32 % (ref 12–46)
Lymphs Abs: 3.2 10*3/uL (ref 0.7–4.0)
Monocytes Absolute: 0.7 10*3/uL (ref 0.1–1.0)
Monocytes Relative: 7 % (ref 3–12)

## 2010-03-22 LAB — COMPREHENSIVE METABOLIC PANEL
ALT: 11 U/L (ref 0–53)
AST: 20 U/L (ref 0–37)
Albumin: 3.6 g/dL (ref 3.5–5.2)
CO2: 24 mEq/L (ref 19–32)
Calcium: 8.8 mg/dL (ref 8.4–10.5)
Creatinine, Ser: 1 mg/dL (ref 0.4–1.5)
GFR calc Af Amer: >60 mL/min (ref >60)
GFR calc non Af Amer: >60 mL/min (ref >60)
Sodium: 139 mEq/L (ref 135–145)
Total Protein: 7 g/dL (ref 6.0–8.3)

## 2010-03-22 LAB — URINALYSIS, ROUTINE W REFLEX MICROSCOPIC
Glucose, UA: NEGATIVE mg/dL
Hgb urine dipstick: NEGATIVE
Protein, ur: NEGATIVE mg/dL
Specific Gravity, Urine: 1.025 (ref 1.005–1.030)
pH: 5.5 (ref 5.0–8.0)

## 2010-03-22 LAB — URINE CULTURE
Colony Count: NO GROWTH
Culture: NO GROWTH

## 2010-03-23 ENCOUNTER — Ambulatory Visit (HOSPITAL_COMMUNITY): Payer: Medicare Other | Admitting: Oncology

## 2010-03-23 ENCOUNTER — Encounter (HOSPITAL_COMMUNITY): Payer: Medicare Other | Attending: Oncology

## 2010-03-23 DIAGNOSIS — Z1211 Encounter for screening for malignant neoplasm of colon: Secondary | ICD-10-CM | POA: Insufficient documentation

## 2010-03-23 DIAGNOSIS — Z800 Family history of malignant neoplasm of digestive organs: Secondary | ICD-10-CM | POA: Insufficient documentation

## 2010-03-23 DIAGNOSIS — Z1289 Encounter for screening for malignant neoplasm of other sites: Secondary | ICD-10-CM | POA: Insufficient documentation

## 2010-03-23 DIAGNOSIS — D6859 Other primary thrombophilia: Secondary | ICD-10-CM

## 2010-03-23 DIAGNOSIS — Z8673 Personal history of transient ischemic attack (TIA), and cerebral infarction without residual deficits: Secondary | ICD-10-CM | POA: Insufficient documentation

## 2010-03-23 DIAGNOSIS — Z125 Encounter for screening for malignant neoplasm of prostate: Secondary | ICD-10-CM | POA: Insufficient documentation

## 2010-03-23 DIAGNOSIS — Z86718 Personal history of other venous thrombosis and embolism: Secondary | ICD-10-CM | POA: Insufficient documentation

## 2010-03-25 LAB — DIFFERENTIAL
Basophils Absolute: 0.2 10*3/uL — ABNORMAL HIGH (ref 0.0–0.1)
Basophils Relative: 2 % — ABNORMAL HIGH (ref 0–1)
Eosinophils Absolute: 0.5 10*3/uL (ref 0.0–0.7)
Neutrophils Relative %: 51 % (ref 43–77)

## 2010-03-25 LAB — CBC
MCHC: 32.5 g/dL (ref 30.0–36.0)
RBC: 5.03 MIL/uL (ref 4.22–5.81)
WBC: 10.2 10*3/uL (ref 4.0–10.5)

## 2010-03-25 LAB — PROTIME-INR
INR: 0.93 (ref 0.00–1.49)
Prothrombin Time: 12.4 seconds (ref 11.6–15.2)
Prothrombin Time: 15.6 seconds — ABNORMAL HIGH (ref 11.6–15.2)

## 2010-03-25 LAB — BASIC METABOLIC PANEL
BUN: 7 mg/dL (ref 6–23)
Creatinine, Ser: 1.09 mg/dL (ref 0.4–1.5)
GFR calc non Af Amer: >60 mL/min (ref >60)
Glucose, Bld: 93 mg/dL (ref 70–99)

## 2010-03-25 LAB — COMPREHENSIVE METABOLIC PANEL
ALT: 13 U/L (ref 0–53)
Alkaline Phosphatase: 94 U/L (ref 39–117)
CO2: 32 mEq/L (ref 19–32)
Calcium: 9.1 mg/dL (ref 8.4–10.5)
Chloride: 102 mEq/L (ref 96–112)
GFR calc non Af Amer: >60 mL/min (ref >60)
Glucose, Bld: 82 mg/dL (ref 70–99)
Potassium: 2.8 mEq/L — ABNORMAL LOW (ref 3.5–5.1)
Sodium: 139 mEq/L (ref 135–145)
Total Bilirubin: 0.5 mg/dL (ref 0.3–1.2)

## 2010-04-01 LAB — CBC
HCT: 47.6 % (ref 39.0–52.0)
MCHC: 33.8 g/dL (ref 30.0–36.0)
Platelets: 136 10*3/uL — ABNORMAL LOW (ref 150–400)
RDW: 15.7 % — ABNORMAL HIGH (ref 11.5–15.5)

## 2010-04-01 LAB — BASIC METABOLIC PANEL
BUN: 12 mg/dL (ref 6–23)
CO2: 24 mEq/L (ref 19–32)
Calcium: 8.8 mg/dL (ref 8.4–10.5)
GFR calc non Af Amer: >60 mL/min (ref >60)
Glucose, Bld: 118 mg/dL — ABNORMAL HIGH (ref 70–99)

## 2010-04-01 LAB — DIFFERENTIAL
Basophils Absolute: 0 10*3/uL (ref 0.0–0.1)
Basophils Relative: 0 % (ref 0–1)
Eosinophils Relative: 2 % (ref 0–5)
Lymphocytes Relative: 33 % (ref 12–46)
Monocytes Absolute: 0.6 10*3/uL (ref 0.1–1.0)
Neutro Abs: 7.5 10*3/uL (ref 1.7–7.7)

## 2010-04-01 NOTE — Op Note (Signed)
NAME:  Jeffrey Sweeney, Jeffrey Sweeney NO.:  [id number]  MEDICAL RECORD NO.:  [id number]           PATIENT TYPE:  O  LOCATION:  DAYP                          FACILITY:  [facility]  PHYSICIAN:  Lionel December, M.D.    DATE OF BIRTH:  25-Oct-1945  DATE OF PROCEDURE:  03/08/2010 DATE OF DISCHARGE:                              OPERATIVE REPORT   PROCEDURE:  Esophagogastroduodenoscopy followed by colonoscopy.  INDICATION:  Jeffrey Sweeney is 65 year old Caucasian male with history of hypercoagulable state.  He has history of both arterial and venous thrombotic events.  He has been evaluated by Dr. Mariel Sleet and his chest and abdominopelvic CT has been negative.  Similarly, he has had hypercoagulable screen testing for protein C and S and other markers which have all been negative.  He is undergoing this evaluation to rule out occult carcinoma in his upper GI tract.  Interestingly, his family history is significant for gastric carcinoma in both parents, but they were in their later years.  Procedures were reviewed with the patient. Informed consent was obtained.  Please note the patient's last colonoscopy was 9 years ago.  MEDS FOR CONSCIOUS SEDATION:  Cetacaine spray for oropharyngeal topical anesthesia, fentanyl 50 mcg IV, Versed 3 mg IV.  FINDINGS:  Procedure performed in endoscopy suite.  The patient's vital signs and O2 sat were monitored during the procedure and remained stable.  PROCEDURES: 1. Esophagogastroduodenoscopy.  The patient was placed in left lateral     recumbent position and Pentax videoscope was passed through     oropharynx without any difficulty into esophagus. 2. Esophagus.  Mucosa of the esophagus was normal.  GE junction was     located at 41 cm from the incisors and unremarkable. 3. Stomach.  It was empty and distended very well by insufflation.     Folds of proximal stomach are normal.  Examination of mucosa     revealed multiple antral erosions.  There was a  small ulcer about 8     x 6 mm at angularis, best seen on retroflex view.  Endoscopically,     it appeared to be benign ulcer, nevertheless biopsies were taken     for histology.  Fundus and cardia were unremarkable.  Pyloric     channel was patent. 4. Duodenum.  Bulbar mucosa was normal.  Scope was passed in second     part of duodenal mucosa and folds were normal.  Endoscope was     withdrawn and patient prepared for procedure #2. 5. Colonoscopy.  Rectal examination performed.  No abnormality noted     on external or digital exam.  Pentax videoscope was placed through     rectum and advanced under vision into sigmoid colon and beyond.     Preparation was satisfactory.  He had few diverticula at sigmoid     and transverse colon.  Scope was passed into cecum which was     identified by appendiceal orifice and ileocecal valve.  As the     scope was withdrawn, colonic mucosa was carefully examined.  There     was 4-mm polyp at transverse colon  which was ablated via cold     biopsy.  Mucosa and rest of the colon was normal.  Rectal mucosasimilarly was normal.  Scope was retroflexed to examine anorectal     junction and small hemorrhoids noted below the dentate line.     Endoscope was withdrawn.  Withdrawal time was 8 minutes.  The     patient tolerated the procedures well.  FINAL DIAGNOSES: 1. Benign appearing gastric ulcer at angularis along with multiple     gastric erosions.  Endoscopically this ulcer appears to be benign     biopsy taken. 2. Few diverticula at sigmoid and transverse colon. 3. A 4-mm polyp ablated via cold biopsy from transverse colon. 4. No evidence of colonic carcinoma.  RECOMMENDATIONS: 1. He will resume his Coumadin today, but take 10 mg and thereafter as     usual dose.  He will get his INR checked in 10-14 days. 2. Pantoprazole 40 mg p.o. q.a.m. prescription given for 30 with 11     refills. 3. I will be contacting patient with results of  biopsy.     Lionel December, M.D.     NR/MEDQ  D:  03/08/2010  T:  03/08/2010  Job:  [id number]  cc:   Ladona Horns. Mariel Sleet, MD Fax: [phone]  Dr. Juanetta Gosling  Electronically Signed by Lionel December M.D. on 04/01/2010 12:51:27 PM

## 2010-04-11 LAB — URINALYSIS, ROUTINE W REFLEX MICROSCOPIC
Bilirubin Urine: NEGATIVE
Ketones, ur: NEGATIVE mg/dL
Nitrite: NEGATIVE
Protein, ur: NEGATIVE mg/dL
Urobilinogen, UA: 1 mg/dL (ref 0.0–1.0)

## 2010-04-11 LAB — BASIC METABOLIC PANEL
BUN: 12 mg/dL (ref 6–23)
BUN: 13 mg/dL (ref 6–23)
BUN: 9 mg/dL (ref 6–23)
CO2: 22 mEq/L (ref 19–32)
CO2: 28 mEq/L (ref 19–32)
Chloride: 106 mEq/L (ref 96–112)
Chloride: 109 mEq/L (ref 96–112)
Chloride: 110 mEq/L (ref 96–112)
GFR calc non Af Amer: >60 mL/min (ref >60)
GFR calc non Af Amer: >60 mL/min (ref >60)
Glucose, Bld: 106 mg/dL — ABNORMAL HIGH (ref 70–99)
Glucose, Bld: 113 mg/dL — ABNORMAL HIGH (ref 70–99)
Glucose, Bld: 94 mg/dL (ref 70–99)
Potassium: 3.3 mEq/L — ABNORMAL LOW (ref 3.5–5.1)
Potassium: 3.4 mEq/L — ABNORMAL LOW (ref 3.5–5.1)
Potassium: 3.7 mEq/L (ref 3.5–5.1)
Sodium: 137 mEq/L (ref 135–145)
Sodium: 137 mEq/L (ref 135–145)

## 2010-04-11 LAB — DIFFERENTIAL
Eosinophils Absolute: 0.2 10*3/uL (ref 0.0–0.7)
Eosinophils Relative: 2 % (ref 0–5)
Lymphocytes Relative: 30 % (ref 12–46)
Lymphs Abs: 3.2 10*3/uL (ref 0.7–4.0)
Monocytes Absolute: 0.5 10*3/uL (ref 0.1–1.0)

## 2010-04-11 LAB — CBC
HCT: 47.5 % (ref 39.0–52.0)
Hemoglobin: 16 g/dL (ref 13.0–17.0)
MCV: 95.2 fL (ref 78.0–100.0)
Platelets: 163 10*3/uL (ref 150–400)
RDW: 15.1 % (ref 11.5–15.5)
WBC: 10.4 10*3/uL (ref 4.0–10.5)

## 2010-04-11 LAB — POCT CARDIAC MARKERS
Myoglobin, poc: 67.5 ng/mL (ref 12–200)
Troponin i, poc: <0.05 ng/mL (ref 0.00–0.09)

## 2010-04-11 LAB — MAGNESIUM: Magnesium: 2.2 mg/dL (ref 1.5–2.5)

## 2010-04-11 LAB — PROTIME-INR: Prothrombin Time: 13 seconds (ref 11.6–15.2)

## 2010-05-15 ENCOUNTER — Encounter (INDEPENDENT_AMBULATORY_CARE_PROVIDER_SITE_OTHER): Payer: Medicare Other | Admitting: Psychiatry

## 2010-05-15 DIAGNOSIS — F331 Major depressive disorder, recurrent, moderate: Secondary | ICD-10-CM

## 2010-05-25 NOTE — Discharge Summary (Signed)
NAME:  Jeffrey Sweeney, Jeffrey Sweeney NO.:  [id number]   MEDICAL RECORD NO.:  [id number]            PATIENT TYPE:   LOCATION:                                 FACILITY:   PHYSICIAN:  Lonia Blood, M.D.       DATE OF BIRTH:  1945/04/23   DATE OF ADMISSION:  DATE OF DISCHARGE:                                 DISCHARGE SUMMARY   PRIMARY CARE PHYSICIAN:  Edward L. Juanetta Gosling, M.D.   DISCHARGE DIAGNOSIS:  1. Subacute infarct in the right parietal lobe and basal ganglia.  2. Significant stenosis of the right carotid artery in the intracranial      portion.  3. Hyperlipidemia.  4. Bipolar disorder on chronic Nardil therapy.  5. Chronic low back pain.  6. Multiple knee and ankle surgeries.   DISCHARGE MEDICATIONS:  1. Nardil 15 mg by mouth three times a day.  2. Topamax 50 mg by mouth twice a day.  3. Aspirin 325 mg by mouth daily.  4. Lovenox 40 mg by mouth daily.  5. Zocor 20 mg by mouth at bedtime.  6. Tylenol 650 mg q.4 h. as needed for pain.  7. Klonopin 0.25 mg by mouth q.12 h.   CONDITION ON DISCHARGE:  Jeffrey Sweeney was transferred on September 25, 2005 to  the inpatient rehabilitation unit at [facility] under the care of  Dr. Riley Kill for intensive physical therapy, occupational therapy, and speech  therapy.   PROCEDURES DURING THIS ADMISSION:  1. September 19, 2005 the patient underwent a noncontrasted head CT that      showed subacute infarction of the right parietal lobe near the vertex  2. September 20, 2005 transthoracic echocardiogram that showed ejection      fraction of 50-55%, possible hypokinesis of the septal wall.  3. September 20, 2005 carotid ultrasound that did not show any stenosis in      the right-or-left ICA  4. September 21, 2005 MRI of the brain showing large right hemispheric,      nonhemorrhagic infarct involving the right temporal lobe, right      parietal lobe, and right frontal lobe in the right middle cerebral      artery distribution.  MRA  of the head showing significant stenosis in      the right carotid terminus. MRA of the neck showing left subclavian      artery proximal and distal.  Left vertebral artery stenosis.  5. September 26, 2005 portable chest x-ray showing cardiomegaly and no      infiltrate.   CONSULTATIONS DURING THIS ADMISSION:  1. The patient was seen in consultation by Dr. Riley Kill from physical      medicine rehabilitation services.  2. Dr. Corliss Skains from interventional radiology.  3. Dr. Jeanie Sewer from psychiatry.  4. Dr. Pearlean Brownie from the neurology.   For admission history and physical refer to the dictated H&P done on  September 13 by Dr. Gasper Sells.   HOSPITAL COURSE BY PROBLEMS:  Problem #1.  LARGE RIGHT MCA STROKE.  Mr.  Sweeney presented to [facility] with a subacute right  MCA stroke  leaving him with left upper extremity paresis.  He had initially also left  lower extremity deficits, but he recovered them very quickly.  The patient  did not have any speech difficulties, but he display some left-sided  neglect.  The patient also displayed some cognitive problems after his large  stroke.  The patient was started immediately on aspirin; and he had full  evaluation to ascertain the etiology of the stroke.  It became apparent that  the patient's stroke was related to intracerebral atherosclerosis; and the  patient was referred for a possible stenting of the ICA.  As of the time of  this dictation, the patient is still debating whether or not to pursue  cerebral angiogram and stenting of his carotid.   Problem #2:  HISTORY OF DEPRESSION AND BIPOLAR DISORDER.  The patient was  seen in consultation by Dr. Antonietta Breach from psychiatry who decided that  he is going to taper him off of Nardil and try to start him on a different  antidepressant.  Also we had to decrease to Topamax due to the patient's  over sedation initially.   Problem #3:  HYPERLIPIDEMIA, which was discovered during this  admission.  We  have decided that we are going to treat the patient with simvastatin in the  form of 20 mg by mouth at bedtime.      Lonia Blood, M.D.  Electronically Signed     SL/MEDQ  D:  09/28/2005  T:  10/01/2005  Job:  [id number]

## 2010-05-25 NOTE — Consult Note (Signed)
NAME:  Jeffrey Sweeney, Jeffrey Sweeney NO.:  [id number]   MEDICAL RECORD NO.:  [id number]          PATIENT TYPE:  IPS   LOCATION:  4033                         FACILITY:  [facility]   PHYSICIAN:  Antonietta Breach, M.D.  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   REASON FOR CONSULTATION:  Depression.   Jeffrey Sweeney is a 65 year old male admitted to the [facility] health  system on the 13th of September after a stroke.   The patient was found by his daughter ,on the day of admission, lying across  his bed, unable to speak.  He had a facial droop and was very weak.  He now  has residual left arm motor weakness.  He is not having any hallucinations  or delusions.  He is not combative.  He reports a depressed mood, low energy  and decreased concentration, as well as anhedonia.  He states that if things  got worse, that he would have suicidal thoughts, but he has none now.  He  has the ability to cooperate with his care.   PAST PSYCHIATRIC HISTORY:  The patient began experiencing periods of  euphoria, high mood, racing thoughts and increased goal directed activity  for several days at a time in his 40s.  As time went on, he began to  experience more depression and has required admission to [facility], as  well as [facility] in the past for depression.   The patient denies any history of electroconvulsive therapy.  He states that  Prozac made him feel worse.  He cannot recall any other antidepressants  utilized, except Nardil.  He had a suicide attempt approximately 10 years  ago.   There is no evidence from the history that the patient ever had a  hypertensive crisis during his Nardil course, but it is evident that he has  been on the Nardil for approximately 12 years.  The patient does not recall  any history of lithium, Tegretol or  Depakote prescription.   With the patient's permission, family members confirmed in the room, the  patient's history of manic episodes as described during these periods, his  thoughts would race, his mood would be very high, he would have very high  energy and he would stay up all night.  In one case, cooking several dishes.   FAMILY PSYCHIATRIC HISTORY:  None known.   SOCIAL HISTORY:  The patient does not use any illegal drugs.  He does not  use alcohol.   He is widowed.  His religion is [employer].  He has been living alone.   GENERAL MEDICAL PROBLEMS:  1. Cerebrovascular disease with a recent CVA.  2. Chronic back pain.  3. Hypertension.  4. Coronary artery disease with a history of MI in the past.   MEDICATIONS:  The MAR is reviewed.  The patient is currently on Nardil 15 mg  t.i.d. and he is also still on Topamax 50 mg q.a.m.  ALLERGIES:  INCLUDE DEMEROL.   LABORATORY DATA:  CBC is unremarkable.  ESR is 2.  INR 1.0.  Metabolic panel  unremarkable.  Hemoglobin A1c was normal at 5.6.  TSH normal at 1.2.  RPR  nonreactive.   MRI of the brain showed a large right hemispheric, nonhemorrhagic infarct  acutely.  The patient does have atrophy and small vessel disease type  changes.   REVIEW OF SYSTEMS:  CONSTITUTIONAL:  Afebrile.  HEAD:  No trauma.  EYES:  No  visual changes.  EARS:  No hearing impairment.  NOSE:  No rhinorrhea.  MOUTH/THROAT:  No sore throat.  NEUROLOGIC:  As above.  PSYCHIATRIC:  As  above.  The patient's Topamax had been titrated to 200 mg b.i.d. by his  outpatient psychiatrist.  When he was first admitted, he continued to have  stupor and the Topamax was tapered down to 50 mg daily with an elimination  of the stupor.  CARDIOVASCULAR:  No chest pain.  RESPIRATORY:  No cough.  GASTROINTESTINAL:  No nausea or diarrhea.  GENITOURINARY:  No dysuria.  SKIN:  Unremarkable.  MUSCULOSKELETAL:  The patient does have a history of  multiple knee surgeries, as well as  ankle surgery.  ENDOCRINE/METABOLIC:  Unremarkable.  HEMATOLOGIC/LYMPHATIC:  Unremarkable.   EXAMINATION:  VITAL SIGNS:  Temperature is 97.3.  Pulse 70.  Respirations  20.  Blood pressure 121/60.  O2 saturation on room air is 94%.   MENTAL STATUS EXAM:  Jeffrey Sweeney is an alert, middle-aged male, sitting back  in a partially declined supine position in his hospital bed.  He is polite  and socially appropriate.  His fund of knowledge and intelligence are within  normal limits, although there is some question as to whether they are below  that of his premorbid baseline.  This is difficult to ascertain due to some  word finding difficulty.  He has, overall, coherent thought process.  On  thought content, there are no hallucinations or delusions and there are no  thoughts of harming himself or others.  He is grossly oriented to all  spheres.  On speech, there is some partial dysarthria.  His mood is  depressed.  His affect is constricted.  He does have some forlorn faces that  are congruent to the content and the interview.  His judgment is intact for  collaborating with care.  His insight is partial.  He is well aware of his  depression.  His memory is intact to immediate, recent and remote.  Concentration mildly decreased.   ASSESSMENT:  AXIS I:  293.83 mood disorder not other specified, depressed  (functional and general medical factors).  Bipolar I disorder, depressed.  AXIS II:  Deferred.  AXIS III:  See general medical problems.  AXIS IV:  General medical, primary support group.  AXIS V:  55.   Mr. Cafarelli is not at risk to harm himself or others.  He is able to  collaborate and cooperate with his general medical team.  He agrees to call  emergency services immediately for any thoughts of harming himself or others  or distress.   The undersigned provided ego supportive therapy and education regarding Nardil, the undersigned also discussed alternatives to Nardil for  antidepression,  as well as various mood stabilizers.  The patient  understands and would like to taper off of his Nardil in preparation for  starting new antidepressant therapy, as well as mood stabilization therapy.   RECOMMENDATIONS:  1. The undersigned will follow the  patient while on the Rehabilitation      Unit and institute new antidepressant therapy, as well as mood      stabilization therapy.  2. Ego supportive psychotherapy.  3. Preliminary discharge planning:  It is anticipate that the patient will      not need inpatient psychiatric hospitalization after his general      medical hospitalization.  It is anticipated that he will need a follow      up with his outpatient psychiatrist within the first week of discharge.  4. Regarding the medications that will be started after the phenelzine      taper off, 2 weeks will be required before medications with monoamine      reuptake inhibition can be started.  It is anticipated that either      Wellbutrin or Cymbalta may be the antidepressant instituted.  Mood      stabilizers might include either Lamictal or Depakote.  This will be      deferred at this time until further outpatient records can be gathered      regarding previous trials with these agents.  5. Given that Depakote is not contraindicated and its popularity, it would      seem that he has been tried on this.  We will ask the case manager to      obtain his outpatient records.      Antonietta Breach, M.D.  Electronically Signed     JW/MEDQ  D:  09/25/2005  T:  09/25/2005  Job:  [id number]

## 2010-05-25 NOTE — H&P (Signed)
NAME:  Jeffrey Sweeney NO.:  [id number]   MEDICAL RECORD NO.:  [id number]          PATIENT TYPE:  AMB   LOCATION:  DAY                           FACILITY:  [facility]   PHYSICIAN:  Denny Peon. Ulice Brilliant, D.P.M.  DATE OF BIRTH:  1945-07-28   DATE OF ADMISSION:  DATE OF DISCHARGE:  LH                                HISTORY & PHYSICAL   DATE OF SURGERY:  Patient scheduled for surgery at [facility] on  November 23, 2003.   HISTORY OF PRESENT ILLNESS:  Jeffrey Sweeney has presented with a painful lump  under the plantar aspect of his left great toe.  This has been present  probably about five months.  He has previously seen his primary care  physician, Dr. Kari Baars who originally sent him to Dr. Hilda Lias.  Dr.  Darreld Mclean has lanced this on two to three occasions.  Nothing is being  removed and it is not improving.  Jeffrey Sweeney relates that although it is  being lanced there is no reduction in the size of it and it is continuing to  bother him quite a bit.   PAST MEDICAL HISTORY:  The patient's past medical history is significant for  previous back injury for which he is on SSI disability.  He has a previous  history also of left knee and right ankle surgeries. He also has a history  of some anxiety/depression issues for which he sees the [facility]   CURRENT MEDICATIONS:  His medications at this point include Adalat,  Nitrostat, hydrocodone 5/500- one to four a day PRN pain, Furosemide,  albuterol, Nardil and alprazolam.   PHYSICAL EXAMINATION:  The patient is a friendly 65 year old white male  noted to have a pronounced enlargement of the plantar and plantar-lateral  aspect of the left great toe, seems to be a subdermal lesion, possibly  extending from the IP joint.  He is noted clinically to have a markedly  reduced range of motion of the first MTP with arthritic changes both in the  MTP and IP joint.  The range of motion of the first  MTP is markedly reduced.  An MRI has been ordered and the results of this are relatively equivocal  with no definitive diagnosis.   ASSESSMENT:  Patient has a pronounced soft tissue lesion under the  interphalangeal joint of the left hallux.  Dr. Emilio Math, my senior  partner in [place] has evaluated and he feels as though it is probably  reactive secondary to reduced range of motion of the first metatarsal  phalangeal joint with increased weight-bearing pressure under the  interphalangeal joint and subsequent thickening of the tissue here.  His  recommendation was implant arthroplasty.   Jeffrey Sweeney and I have discussed the above.  Jeffrey Sweeney relates he is not  ready to undergo an arthroplasty of the first MTP with implant placement, he  would rather just have the lump removed and take his chances.  We have  discussed that and in some respects I am in agreement.  We will go ahead  and  do this under monitored anesthesia care at [facility].  This will consist of an incision on the lateral aspect of  the great toe in the web space with removal of the soft tissue lesion.  I  have discussed the procedure with [name], the fact that he will probably  not be able to resume normal activities for a few weeks.  He has read the  consent form and apparently understood and signed.      CMD/MEDQ  D:  11/22/2003  T:  11/22/2003  Job:  [id number]

## 2010-05-25 NOTE — Discharge Summary (Signed)
NAME:  Jeffrey Sweeney, Jeffrey Sweeney NO.:  [id number]   MEDICAL RECORD NO.:  [id number]          PATIENT TYPE:  IPS   LOCATION:  4033                         FACILITY:  [facility]   PHYSICIAN:  Jeffrey Sweeney, M.D.DATE OF BIRTH:  August 03, 1945   DATE OF ADMISSION:  09/25/2005  DATE OF DISCHARGE:  10/04/2005                                 DISCHARGE SUMMARY   DISCHARGE DIAGNOSES:  1. Right parietal cerebrovascular accident, with left hemiplegia.  2. High-grade right middle cerebral artery stenosis.  3. Subcutaneous Lovenox for deep vein thrombosis prophylaxis.  4. Hypertension.  5. Depression.  6. Hyperlipidemia.  7. Tobacco abuse.   This is a 65 year old white male with history of hypertension and tobacco  abuse, admitted September 19, 2005 with slurred speech and left-sided  weakness.  Cranial CT scan with subacute infarction right parietal lobe  basal ganglia, without hemorrhage.  MRI with large right hemispheric  nonhemorrhagic infarction.  MRA with significant stenosis around the right  carotid terminus of the head.  MRA of the neck with areas of narrowing  involving proximal left subclavian artery, proximal and distal nondominant  left vertebral artery. Dr. Corliss Sweeney of radiology services was consulted.  Advised cerebral angiogram, but patient refused.  Carotid Dopplers negative.  Echocardiogram with ejection fraction 50%-55% without emboli.  Dr. Jeanie Sewer  consulted from psychiatry services for history of depression. Remained on  Topamax and slow taper of Nardil.  Placed on aspirin therapy for CVA  prophylaxis.  Subcutaneous Lovenox for deep vein thrombosis prophylaxis.  Consultation was made for smoking cessation.  The patient was admitted for  comprehensive rehab program.   PAST MEDICAL HISTORY:  See discharge diagnoses.   ALLERGIES:  DEMEROL.   Denies alcohol.   SOCIAL HISTORY:  Widowed, lives alone.  The patient is on disability. Family  can assist as  needed.   MEDICATIONS PRIOR TO ADMISSION:  1. Nardil 15 mg 2 tablets 3 times daily.  2. Lasix 40 mg daily.  3. Topamax 200 mg twice daily.  4. Neurontin 100 mg 3 tablets daily.  5. Ambien at bedtime.   REHABILITATION HOSPITAL COURSE:  The patient was admitted to inpatient rehab  services, with therapies initiated on a 3-hour daily basis consisting of  physical therapy, occupational therapy, speech therapy, and rehabilitation  nursing. The following issues were addressed during the patient's  rehabilitation stay.  Pertaining to Jeffrey Sweeney right parietal  cerebrovascular accident, with left-sided weakness and dysarthria, his  speech was intelligible for basic conversation.  He remained on aspirin  therapy for CVA prophylaxis.  Subcutaneous Lovenox was used for deep vein  thrombosis prophylaxis.  He was overall supervision for his mobility as well  as for activities of daily living, although needing some minimal cues for  lower body dressing. The patient was also advised no driving.  During workup  of cerebral infarction, noted high-grade right MCA stenosis.  Dr. Corliss Sweeney  followed for cerebral angiogram.  The patient again refused on multiple  occasions.  It was advised that he follow up with Dr. Corliss Sweeney on an  outpatient basis for consideration of cerebral  angiogram.  His blood  pressures remained monitored on no present antihypertensive medication.  He  was followed by psychiatry services for history of depression. His Topamax  had been decreased to 50 mg twice daily for migraine headaches.  He was also  on Nardil for his depression at 50 mg daily with a slow taper.  Noted  history of tobacco abuse.  It was discussed at length the need for cessation  of smoking.  The patient had refused a patch.  It was questionable if he  would be compliant with these requests.  He was discharged to home with  family.   Latest labs showed hemoglobin 15.7, hematocrit 46.3, platelets 262,000,  WBC  8.1. Sodium 140, potassium 3.5,  BUN 15, creatinine 0.8.  Urinalysis study:  No growth.   DISCHARGE MEDICATIONS:  1. Zocor 20 mg daily.  2. Topamax 50 mg twice daily.  3. Aspirin 325 mg daily.  4. Nardil 50 mg daily.  5. Tylenol as needed.   ACTIVITY:  As tolerated.  Advised no driving.   DIET:  Regular.   He would follow up with Dr. Claudette Sweeney at the [facility].  Dr. Corliss Sweeney for consideration of cerebral angiogram on an  outpatient basis to assess high-grade right MCA stenosis.  Also follow with  Dr. Kari Sweeney, medical management.      Jeffrey Sweeney, P.A.      Jeffrey Sweeney, M.D.  Electronically Signed    DA/MEDQ  D:  10/03/2005  T:  10/04/2005  Job:  [id number]   cc:   Jeffrey Sweeney, M.D.  Jeffrey Sweeney, M.D.

## 2010-05-25 NOTE — Procedures (Signed)
NAME:  Jeffrey Sweeney, Jeffrey Sweeney NO.:  [id number]   MEDICAL RECORD NO.:  [id number]         PATIENT TYPE:  REC   LOCATION:                                FACILITY:  [facility]   PHYSICIAN:  Vida Roller, M.D.   DATE OF BIRTH:  Feb 19, 1945   DATE OF PROCEDURE:  01/29/2005  DATE OF DISCHARGE:                                    STRESS TEST   HISTORY:  Jeffrey Sweeney is a 65 year old gentleman with no known coronary  disease and atypical chest discomfort.  Cardiac risk factors with tobacco  abuse, family history and unknown lipid status.   BASELINE DATA:  Electrocardiogram reveals sinus bradycardia at 57 beats per  minute and nonspecific ST abnormalities.  Blood pressure is 128/70.   The patient exercised for a total of 5 minutes into Bruce protocol stage II  to 7.0 METS.  Maximal heart rate achieved was 126 beats a minute which is  78% of predicted maximum.  Unfortunately, Jeffrey Sweeney did not reach 85% of  predicted maximum. He did stop walking abruptly approximately 30 seconds  after we injected his Myoview secondary to leg weakness.   The patient reported intermittent sharp chest pains like a pin sticking  me.  He states this pain was aggravated by pressing on his chest in a  certain area.  This resolved in recovery.  EKG revealed no arrhythmias.  No  ischemic changes were noted.  Exercise was stopped secondary to fatigue and  leg weakness.   Final images and results are pending MD review.      Jae Dire, P.A. [facility]      Vida Roller, M.D.  Electronically Signed    AB/MEDQ  D:  01/29/2005  T:  01/29/2005  Job:  [id number]

## 2010-05-25 NOTE — H&P (Signed)
NAME:  Jeffrey Sweeney, Jeffrey Sweeney NO.:  [id number]   MEDICAL RECORD NO.:  [id number]          PATIENT TYPE:  INP   LOCATION:  1825                         FACILITY:  [facility]   PHYSICIAN:  Thomasenia Bottoms, MDDATE OF BIRTH:  1945-01-18   DATE OF ADMISSION:  09/19/2005  DATE OF DISCHARGE:                                HISTORY & PHYSICAL   PRIMARY CARE PHYSICIAN:  Edward L. Juanetta Gosling, M.D. in [place], phone number  is [phone].   HISTORY OF PRESENT ILLNESS:  Jeffrey Sweeney is a 65 year old gentleman whose  daughter found him about 2 o'clock today lying across the bed unable to  speak.  He had a droop in his face and was very weak and could not get up.  The patient says his memory is fuzzy, but at some point last night he fell.  It was unclear if he fell out of bed or if he fell from a different room,  but he fell and was too weak to get up.  He tried to drag himself to the  bed.  It took him quite some time to do this.  He laid in the floor for  quite some time.  Eventually he was able to get himself back up on the bed  and the daughter found him at 2 p.m.  He was last seen at 8 p.m. last night  and he was completely in his usual state of health at that time.  The  patient complains of a headache at this time, but denies any chest pain,  fevers, and is vague on exactly what happened last night.  He has never had  anything like this before.   PAST MEDICAL HISTORY:  Significant for chronic back pain and some nerve  problem stemming from his back pain.  He has a history of hypertension and  a history of an MI in the distant past.  Also has had multiple knee  surgeries and ankle surgery.  He is a slightly poor historian.   SOCIAL HISTORY:  He does smoke cigarettes, one to two packs a day.  No  alcohol or illicit drugs.  He lives alone.   FAMILY HISTORY:  He denies any history of stroke, MI, hypertension, or  diabetes in his family.   MEDICATIONS:  On arrival per the nursing  records, he was on:  1. Procardia.  2. Lasix.  3. Oxycodone.  4. Acetaminophen for pain.  5. Topamax.  6. Ambien.  7. Gabapentin.  8. Nardil.   The patient and family are unaware of the doses at this time.   REVIEW OF SYSTEMS:  CONSTITUTIONAL:  Prior to this, his appetite was okay  and he was not losing weight.  HEENT:  He has a long history of with trouble  with headaches, he says stemming from his right eye.  No sinus problems.  Double vision, sore throat.  CARDIOVASCULAR:  No chest pain recently.  Denies orthopnea or lower extremity edema.  RESPIRATORY:  No severe cough,  no hemoptysis.  Mild shortness of breath.  GASTROINTESTINAL:  He has not  vomited blood.  He did see some blood in his stool in the past, but that was  checked out and he says everything is fine.  No abdominal pain, vomiting.  NEUROLOGY:  He has chronic back pain.  He used to have trouble with pain  shooting down his legs, but he says that has improved.  He is on multiple  medications for his nerve pain.  No history of seizures.  MUSCULOSKELETAL:  He has had multiple joint problems and multiple joint surgeries.  HEMATOLOGIC:  No trouble with bruising easily.   PHYSICAL EXAMINATION:  VITAL SIGNS:  In the emergency department,  temperature was 99.3, pulse 114/63, O2 saturations 96% on room air.  Respirations 22, pulse 80.  GENERAL:  The patient is in no acute distress.  HEENT:  Normocephalic and atraumatic.  His pupils are round and sclerae  anicteric.  Oral mucosa moist.  NECK:  Supple, no lymphadenopathy, no thyromegaly, and no jugular venous  distention.  HEART:  Regular rate and rhythm with no murmurs, rubs, or gallops.  LUNGS:  Faint expiratory wheeze anteriorly, but otherwise clear to  auscultation.  ABDOMEN:  Soft, nontender, and nondistended.  Normal active bowel sounds.  No masses are appreciated.  EXTREMITIES:  No evidence of cyanosis, clubbing, or edema.  He has 2+  palpable DP pulses  bilaterally.  NEUROLOGY:  The patient is alert and oriented x3.  He is cooperative.  He  has a left-sided facial droop.  His strength in his right upper and lower  extremity is 5/5, in his left upper extremity he is unable to shrug his  shoulder fully.  He has essentially no movement of the left upper extremity,  cannot really wiggle his fingers.  The left lower extremity, he can briefly  lift his leg off the bed.  He has weak dorsiflexion and plantar flexion  abilities.  He reports paresthesias in his left upper extremity, but nowhere  else.  His sensory examination seems to be intact in his right side and in  the left lower extremity.  SKIN:  Multiple bruises particularly over his left knee and he has what  appears to be a rub burn over his left flank area.  He has an approximately  4 x 4 bruise over the top of his left shoulder with some swelling.  MUSCULOSKELETAL:  He has full range of motion in each of his extremities,  though, I have to do the work on the left side.   LABORATORY DATA:  White count is 10.1, hemoglobin 14.8, hematocrit 43.6,  platelet count is 219.  Sodium 143, potassium 3.4, chloride 115, BUN 14,  glucose 98.  Creatinine is 1.2, troponin is less than 0.05, CK-MB is normal,  INR 1.0, PT 12.9.   The head CT report is not available, but by report from the emergency  department it revealed a parietal infarct.   ASSESSMENT:  This is a 65 year old gentleman who presents with a subacute  cerebrovascular accident.   PLAN:  Admit him to the hospital.  We will start him on a daily aspirin  which he was not taking.  We will do the routine CVA workup including  carotid ultrasound and start with a transthoracic echo.  We will order PT,  OT, and speech therapy for his left-sided deficits.  The patient is unable  to stand at this point, so he may need inpatient rehab before returning  home.  I did discuss all of this with the patient and his son.  The  daughter also has agreed  to bring in the patient's medication so we can obtain the  doses tomorrow.      Thomasenia Bottoms, MD  Electronically Signed     CVC/MEDQ  D:  09/19/2005  T:  09/19/2005  Job:  [id number]   cc:   Ramon Dredge L. Juanetta Gosling, M.D.

## 2010-05-25 NOTE — H&P (Signed)
NAME:  Jeffrey Sweeney, Jeffrey Sweeney NO.:  [id number]   MEDICAL RECORD NO.:  [id number]          PATIENT TYPE:  IPS   LOCATION:  4033                         FACILITY:  [facility]   PHYSICIAN:  [name], M.D.DATE OF BIRTH:  [birth year]   DATE OF ADMISSION:  09/25/2005  DATE OF DISCHARGE:                                HISTORY & PHYSICAL   PRIMARY CARE PHYSICIAN:  Dr. Kari Baars.   CHIEF COMPLAINT:  Left-sided weakness.   HISTORY OF PRESENT ILLNESS:  This is a 65 year old white male, history of  hypertension, tobacco.  He was admitted on September 13 with slurred speech  and left-sided weakness.  Head CT revealed a subacute infarct in the right  parietal lobe and basal ganglia without hemorrhage.  MRI revealed a large,  right hemispheric, non-hemorrhagic infarct.  MRI revealed significant  stenosis throughout the carotid system on the right, as well as the proximal  left subclavian artery, proximal and distal non-dominant left vertebral  artery.  Dr. Corliss Skains was consulted for a cerebral angiogram, but the  patient refused.  Patient's remaining workup was unremarkable.  He has been  seen by Dr. Jeanie Sewer for depression, remains on Topamax and Nardil.  Patient has been placed on aspirin for stroke prophylaxis, the subcu Lovenox  for DVT prophylaxis.  The patient continues to have significant weakness in  the left upper extremity and difficulties with gait and self care.  She was  brought to the rehab floor today   REVIEW OF SYSTEMS:  Positive for reflux, insomnia, anxiety, depression.  Does report dry mouth and oral mucosa.   PAST MEDICAL HISTORY:  Positive for hypertension, chronic low back pain,  depression/anxiety, lipoma with excision, ankle surgery, multiple knee  surgery, Positive tobacco.  History is negative for alcohol use.   FAMILY HISTORY:  Negative.   SOCIAL HISTORY:  Patient is widowed, lives alone and is on disability.  Family can assist him as  needed.   FUNCTIONAL HISTORY:  Patient was independent prior to arrival.   ALLERGIES:  Demerol.   MEDICATIONS:  At home:  1. Nardil 15 mg 2 t.i.d.  2. Lasix 40 mg q. day.  3. Topamax 200 mg b.i.d.  4. Neurontin 100 mg 3 q. day.  5. Ambien q.h.s.   LABORATORY:  Hemoglobin 13.8, white count 8.5, platelets 213,000, sodium  140, potassium 4.0, BUN 8, creatinine 0.8.   PHYSICAL EXAMINATION:  VITAL SIGNS:  Blood pressure is 120/70, pulse 72,  respiratory rate 18, temperature 98.4.  GENERAL:  Patient is pleasant, no acute distress.  He is alert, oriented x3.  EAR, NOSE AND THROAT:  Unremarkable except for mucosa was slightly dry.  NECK:  Supple without JVD or lymphadenopathy.  CHEST:  Clear to auscultation bilaterally without wheezes, rales or rhonchi.  HEART:  Regular rate and rhythm without murmur, rubs or gallops.  ABDOMEN:  Soft, nontender.  Bowel sounds are positive.  NEUROLOGICAL:  Cranial nerves 2-12 are grossly intact, except for a mild  left central 7.  Reflexes were decreased on the left.  Sensation was fairly  spared in the left upper  extremity and 1+/2 in the left lower extremity  today.  Saw no resting tone.  Patient's toes pointed down with Babinski  stimulation.  Judgment, orientation and memory were grossly intact.  He was  able to follow simple one-step commands and fair awareness of his  surroundings and situation.  I saw no irritability in his mood, although he  did express occasional feelings of frustration and depression.  Motor  function:  Left upper extremity is 1+/5 at the shoulder, biceps and triceps.  He had trace movement at the left hand.  Left lower extremity movement was  3+ to 4+/5.  Right upper and lower extremity were 5/5.   ASSESSMENT/PLAN:  1. Functional deficits secondary to right parietal stroke with left      hemiparesis and dysarthria with mild hemi-sensory loss.  Begin      comprehensive inpatient rehabilitation with physical therapy to assess       and treat patient for lower extremity strengthening, balance and gait.      Occupational therapy will assess her upper extremities and activities      of daily living.  Speech/language pathology will assess for dysarthria      and cognition.  Will have neuropsychology follow for mood and      adjustment.  We will have 24 hour Rehab nursing will follow patient for      bowel, bladder, skin, medications and safety issues.  Rehab case      manager/social worker will assess for psychosocial needs.  Estimated      length of stay is 7 to 10 days.  Prognosis fair to good.  Goals,      supervision to modified independent.  2. Pain management:  Tylenol.  3. Deep venous thrombosis prophylaxis:  Subcu Lovenox.  4. Hypertension:  Will monitor.  5. Depression:  Continue Topamax and Nardil.  6. Hyperlipidemia:  Zocor.      [name], M.D.  Electronically Signed     ZTS/MEDQ  D:  09/25/2005  T:  09/26/2005  Job:  [id number]   cc:   Ramon Dredge L. Juanetta Gosling, M.D.

## 2010-05-25 NOTE — Op Note (Signed)
NAME:  CALE, BETHARD NO.:  [id number]   MEDICAL RECORD NO.:  [id number]          PATIENT TYPE:  AMB   LOCATION:  DAY                           FACILITY:  [facility]   PHYSICIAN:  Denny Peon. Ulice Brilliant, D.P.M.  DATE OF BIRTH:  [birth year]   DATE OF PROCEDURE:  11/23/2003  DATE OF DISCHARGE:                                 OPERATIVE REPORT   PREOPERATIVE DIAGNOSIS:  Lipoma, plantar aspect of the left great toe.   POSTOPERATIVE DIAGNOSIS:  Lipoma and likely reactive plantar fibroma,  plantar aspect, left great toe.   PROCEDURE:  Excision of lipoma and reactive plantar fibroma, left great toe.   SURGEON:  Denny Peon. Ulice Brilliant, D.P.M.   ANESTHESIA:  Monitored anesthesia care.   CLINICAL FINDINGS:  A large amount of lipoma or adipose tissue within the  plantar aspect of the left great toe underlying the IP joint; however,  following resection of this a large, well-defined soft tissue mass which  resembles plantar fibroma is appreciated and excised, extending from the  base of the distal phalanx to the base of the proximal phalanx.  This is  excised and labeled, reactive plantar fibroma, likely secondary to chronic  increased load-bearing pressure secondary to hallux limitus deformity of the  first MTP of this left foot.   INDICATIONS FOR SURGERY:  Mr. Tigges is a 65 year old white male who has had  an increasingly enlarging soft tissue mass under the left great toe.  He has  been treated conservatively by Dr. Juanetta Gosling and Dr. Darreld Mclean.  Dr.  Hilda Lias has tried to aspirate this on several occasions, and nothing is  forthcoming.  MRI was relatively inconclusive as to what was going on in the  area.   DESCRIPTION OF PROCEDURE:  Mr. Corona was brought in the OR and placed on  the table in the supine position.  IV sedation is established.  A local  block is performed utilizing 8 mL of a 1:1 mixture of lidocaine and Marcaine  about the first MTP of the left great toe.  A pneumatic  ankle tourniquet was  applied across his left ankle.  His foot is prepped and draped in the usual  strict aseptic fashion.  An Ace bandage is then utilized to exsanguinate his  foot.  The tourniquet is inflated to 250 mmHg.   Procedure:  Excision of lipoma and reactive plantar fibroma, left great toe:  Attention is directed to the lateral or inner aspect of the left great toe.  The most noticeable area of enlargement is noted mostly plantarly and  laterally on this plantar aspect of the left great toe.  It seems to extend  most focally at the IP joint and then reduces somewhat going distally and  proximally.  A 4 cm incision is made from the distal aspect of the toe  extending proximally over the lateral aspect of the toe.  Care is taken to  try not to make much of the incision in a weightbearing area.  The incision  is deepened via sharp and blunt dissection.  A significant amount of adipose  tissue is appreciated.  Following resection of this, a large soft tissue  mass is appreciated, which appears superficial to the long flexor tendon to  the great toe.  This is dissected free and excised from the base of the  distal phalanx down to about the base of the proximal phalanx.  The plantar  aspect of the IP joint looks decent with no bony degenerative changes seen.  I believe this is likely a fibroma in origin, likely due to increased load  bearing pressure on the great toe and this joint secondary to hallux  limitus. With the wound inspected for any remaining tissue and found to be  free of such, it is flushed.  Subcutaneous tissues are reapproximated and  closed utilizing 4-0 Vicryl.  Skin is closed utilizing 4-0 Prolene.  A  postoperative injection of Hexadrol is dispensed.  A Betadine-soaked Adaptic  dressing is applied over the incision, and a dry sterile compressive  dressing follows.  The tourniquet was deflated with tourniquet time spanning  37 minutes.   Immediately  postoperatively, I instructed Mr. Cotten on our findings.  He  was shown the specimen.  I also instructed him that I think that Dr. Pricilla Holm,  who saw him in addition to myself, was correct in the assumption that this  was due to the hallux limitus deformity and at some point will probably need  to get this corrected.  I did, however, suggest to him that by correcting  for hallux limitus with implant arthroplasty, this would not have been able  to significantly shrink the soft tissue mass under the great toe.  He should  get some relief from this but likely within a couple of years, he will  probably consider the implant arthroplasty.  Mr. Delone is transported to  day hospital without incident.  While there, a list of instructions was  explained to him.  Will have a further discussion about what we found and  what will likely need to take place at some point.  He is dispensed a  prescription for oxycodone 5/325 mg as well as a prescription for Phenergan  for nausea.  He will be seen within seven days for first postop visit.      CMD/MEDQ  D:  11/23/2003  T:  11/23/2003  Job:  [id number]

## 2010-05-25 NOTE — Op Note (Signed)
[facility]  Patient:    Jeffrey Sweeney, Jeffrey Sweeney Visit Number: [id number] MRN: [id number]          Service Type: END Location: DAY Attending Physician:  Jonathon Bellows Dictated by:   Roetta Sessions, M.D. Proc. Date: 03/27/01 Admit Date:  03/27/2001 Discharge Date: 03/27/2001   CC:         Kari Baars, M.D.   Operative Report  PROCEDURE:  Colonoscopy with biopsy.  ENDOSCOPIST:  Roetta Sessions, M.D.  INDICATIONS:  The patient is a 65 year old gentleman, referred at the courtesy of Dr. Kari Baars for colorectal cancer screening.  He tells me he had a bout of diarrhea a couple of weeks ago, but since this, his bowel function has cleared up.  He has never passed any blood.  No abdominal pain, no weight loss.  He has never had his lower GI tract imaged.  He has no family history of colorectal neoplasia.  The colonoscopy is now being done for a standard screening.  This approach has been discussed with [name].  The potential risks, benefits, and alternatives have been reviewed and his questions answered.  The patient has consented for conscious sedation with fentanyl and Versed.  Please see my handwritten H&P for more information.  DESCRIPTION OF PROCEDURE:  O2 saturation, blood pressure, pulse, and respirations were monitored throughout the entire procedure.  Conscious sedation with Versed 3 mg IV and fentanyl 75 mcg IV in divided doses.  The instrument used was an Olympus diagnostic colonoscope.  The findings on a digital rectal examination revealed no abnormalities.  The prep was good.  The examination of the rectal mucosa including a retroflexed view revealed only two 5.0 mm sessile polyps.  These were cold biopsy/removed.  The colonic mucosa was thoroughly inspected from the rectosigmoid junction through the left transverse right colon, to the area of the appendiceal orifice, the ileocecal valve, and cecum. These structures were well seen  and photographed.  The colonic mucosa all the way to the cecum appeared normal. Because of technical difficulties, the photographs were lost.  From the level of the cecum and the ileocecal valve the scope was slowly withdrawn, and all previously imaged mucosal surfaces were again seen, and no other abnormalities were observed.  The patient tolerated the procedure well and was reactive in endoscopy.  IMPRESSION 1. Diminutive polyps in the rectum, cold biopsy/removed. 2. The remainder of the rectal mucosa and colon appeared normal.  RECOMMENDATION 1. Follow up on pathology. 2. Further recommendations to follow. Dictated by:   Roetta Sessions, M.D. Attending Physician:  Jonathon Bellows DD:  03/27/01 TD:  03/30/01 Job: [id number]

## 2010-06-01 ENCOUNTER — Ambulatory Visit (HOSPITAL_COMMUNITY): Payer: Medicare Other | Admitting: Oncology

## 2010-06-22 ENCOUNTER — Telehealth: Payer: Self-pay

## 2010-07-15 ENCOUNTER — Inpatient Hospital Stay (HOSPITAL_COMMUNITY)
Admission: EM | Admit: 2010-07-15 | Discharge: 2010-07-27 | Disposition: A | Discharge status: Skilled Nursing Facility | Payer: Medicare Other | Source: Home / Self Care | Attending: Pulmonary Disease | Admitting: Pulmonary Disease

## 2010-07-15 ENCOUNTER — Encounter: Payer: Self-pay | Admitting: *Deleted

## 2010-07-15 ENCOUNTER — Other Ambulatory Visit: Payer: Self-pay

## 2010-07-15 DIAGNOSIS — I951 Orthostatic hypotension: Secondary | ICD-10-CM | POA: Diagnosis present

## 2010-07-15 DIAGNOSIS — M545 Low back pain, unspecified: Secondary | ICD-10-CM | POA: Diagnosis present

## 2010-07-15 DIAGNOSIS — G92 Toxic encephalopathy: Secondary | ICD-10-CM | POA: Diagnosis present

## 2010-07-15 DIAGNOSIS — E785 Hyperlipidemia, unspecified: Secondary | ICD-10-CM | POA: Diagnosis present

## 2010-07-15 DIAGNOSIS — R6251 Failure to thrive (child): Secondary | ICD-10-CM

## 2010-07-15 DIAGNOSIS — I10 Essential (primary) hypertension: Secondary | ICD-10-CM | POA: Diagnosis present

## 2010-07-15 DIAGNOSIS — K219 Gastro-esophageal reflux disease without esophagitis: Secondary | ICD-10-CM | POA: Diagnosis present

## 2010-07-15 DIAGNOSIS — I2510 Atherosclerotic heart disease of native coronary artery without angina pectoris: Secondary | ICD-10-CM | POA: Diagnosis present

## 2010-07-15 DIAGNOSIS — G929 Unspecified toxic encephalopathy: Secondary | ICD-10-CM | POA: Diagnosis present

## 2010-07-15 DIAGNOSIS — F341 Dysthymic disorder: Secondary | ICD-10-CM | POA: Diagnosis present

## 2010-07-15 DIAGNOSIS — A498 Other bacterial infections of unspecified site: Secondary | ICD-10-CM | POA: Diagnosis present

## 2010-07-15 DIAGNOSIS — Z8673 Personal history of transient ischemic attack (TIA), and cerebral infarction without residual deficits: Secondary | ICD-10-CM

## 2010-07-15 DIAGNOSIS — G40909 Epilepsy, unspecified, not intractable, without status epilepticus: Principal | ICD-10-CM | POA: Diagnosis present

## 2010-07-15 DIAGNOSIS — I639 Cerebral infarction, unspecified: Secondary | ICD-10-CM | POA: Diagnosis present

## 2010-07-15 DIAGNOSIS — G40209 Localization-related (focal) (partial) symptomatic epilepsy and epileptic syndromes with complex partial seizures, not intractable, without status epilepticus: Secondary | ICD-10-CM | POA: Diagnosis present

## 2010-07-15 DIAGNOSIS — N390 Urinary tract infection, site not specified: Secondary | ICD-10-CM | POA: Diagnosis not present

## 2010-07-15 DIAGNOSIS — E876 Hypokalemia: Secondary | ICD-10-CM | POA: Diagnosis not present

## 2010-07-15 DIAGNOSIS — I679 Cerebrovascular disease, unspecified: Secondary | ICD-10-CM | POA: Diagnosis present

## 2010-07-15 DIAGNOSIS — F17200 Nicotine dependence, unspecified, uncomplicated: Secondary | ICD-10-CM | POA: Diagnosis present

## 2010-07-15 DIAGNOSIS — I69959 Hemiplegia and hemiparesis following unspecified cerebrovascular disease affecting unspecified side: Secondary | ICD-10-CM

## 2010-07-15 DIAGNOSIS — T4275XA Adverse effect of unspecified antiepileptic and sedative-hypnotic drugs, initial encounter: Secondary | ICD-10-CM | POA: Diagnosis present

## 2010-07-15 DIAGNOSIS — I69998 Other sequelae following unspecified cerebrovascular disease: Secondary | ICD-10-CM

## 2010-07-15 HISTORY — DX: Atherosclerotic heart disease of native coronary artery without angina pectoris: I25.10

## 2010-07-15 HISTORY — DX: Cerebral infarction, unspecified: I63.9

## 2010-07-15 HISTORY — DX: Heart disease, unspecified: I51.9

## 2010-07-15 NOTE — ED Notes (Signed)
Passing out for past 2 months, passed out tonight and hit head, shakes bad before passing out, confussion ongoing for 2 months as well

## 2010-07-16 ENCOUNTER — Emergency Department (HOSPITAL_COMMUNITY): Payer: Medicare Other

## 2010-07-16 ENCOUNTER — Encounter (HOSPITAL_COMMUNITY): Payer: Self-pay | Admitting: *Deleted

## 2010-07-16 ENCOUNTER — Inpatient Hospital Stay (HOSPITAL_COMMUNITY)
Admit: 2010-07-16 | Discharge: 2010-07-16 | Disposition: A | Discharge status: Home / Self Care | Payer: Medicare Other | Attending: Neurology | Admitting: Neurology

## 2010-07-16 LAB — BLOOD GAS, ARTERIAL
Bicarbonate: 26.5 mEq/L — ABNORMAL HIGH (ref 20.0–24.0)
pCO2 arterial: 40.5 mmHg (ref 35.0–45.0)
pH, Arterial: 7.432 (ref 7.350–7.450)
pO2, Arterial: 70.2 mmHg — ABNORMAL LOW (ref 80.0–100.0)

## 2010-07-16 LAB — CBC
HCT: 49.5 % (ref 39.0–52.0)
MCH: 32.9 pg (ref 26.0–34.0)
MCV: 95.7 fL (ref 78.0–100.0)
RDW: 14 % (ref 11.5–15.5)
WBC: 16.2 10*3/uL — ABNORMAL HIGH (ref 4.0–10.5)

## 2010-07-16 LAB — DIFFERENTIAL
Basophils Absolute: 0 10*3/uL (ref 0.0–0.1)
Eosinophils Absolute: 0.1 10*3/uL (ref 0.0–0.7)
Eosinophils Relative: 0 % (ref 0–5)
Lymphocytes Relative: 25 % (ref 12–46)
Lymphs Abs: 4 10*3/uL (ref 0.7–4.0)
Monocytes Absolute: 1.5 10*3/uL — ABNORMAL HIGH (ref 0.1–1.0)

## 2010-07-16 LAB — COMPREHENSIVE METABOLIC PANEL
CO2: 26 mEq/L (ref 19–32)
Calcium: 9.7 mg/dL (ref 8.4–10.5)
Creatinine, Ser: 0.96 mg/dL (ref 0.50–1.35)
GFR calc Af Amer: >60 mL/min (ref >60)
GFR calc non Af Amer: >60 mL/min (ref >60)
Glucose, Bld: 104 mg/dL — ABNORMAL HIGH (ref 70–99)

## 2010-07-16 LAB — URINALYSIS, ROUTINE W REFLEX MICROSCOPIC
Leukocytes, UA: NEGATIVE
Nitrite: NEGATIVE
Specific Gravity, Urine: >1.03 — ABNORMAL HIGH (ref 1.005–1.030)
Urobilinogen, UA: 2 mg/dL — ABNORMAL HIGH (ref 0.0–1.0)

## 2010-07-16 MED ORDER — BIOTENE DRY MOUTH MT LIQD
Freq: Two times a day (BID) | OROMUCOSAL | Status: DC
  Administered 2010-07-16 – 2010-07-27 (×21): via OROMUCOSAL

## 2010-07-16 MED ORDER — SODIUM CHLORIDE 0.9 % IV SOLN
INTRAVENOUS | Status: DC
  Administered 2010-07-16 – 2010-07-21 (×13): via INTRAVENOUS
  Administered 2010-07-21: 950 mL via INTRAVENOUS
  Administered 2010-07-22 – 2010-07-27 (×12): via INTRAVENOUS

## 2010-07-16 MED ORDER — SODIUM CHLORIDE 0.9 % IV SOLN
Freq: Once | INTRAVENOUS | Status: AC
  Administered 2010-07-16: via INTRAVENOUS

## 2010-07-16 MED ORDER — ASPIRIN 325 MG PO TABS
325.0000 mg | ORAL_TABLET | Freq: Every day | ORAL | Status: DC
  Administered 2010-07-16 – 2010-07-26 (×11): 325 mg via ORAL
  Filled 2010-07-16 (×12): qty 1

## 2010-07-16 MED ORDER — SODIUM CHLORIDE 0.9 % IV SOLN
1000.0000 mg | Freq: Once | INTRAVENOUS | Status: AC
  Administered 2010-07-16: 1000 mg via INTRAVENOUS
  Filled 2010-07-16: qty 10

## 2010-07-16 MED ORDER — MIDODRINE HCL 5 MG PO TABS
10.0000 mg | ORAL_TABLET | Freq: Three times a day (TID) | ORAL | Status: DC
  Administered 2010-07-16 – 2010-07-27 (×33): 10 mg via ORAL
  Filled 2010-07-16 (×43): qty 2

## 2010-07-16 MED ORDER — LORAZEPAM 2 MG/ML IJ SOLN
0.5000 mg | Freq: Three times a day (TID) | INTRAMUSCULAR | Status: DC | PRN
  Administered 2010-07-16 – 2010-07-19 (×3): 0.5 mg via INTRAVENOUS
  Filled 2010-07-16 (×3): qty 1

## 2010-07-16 MED ORDER — SODIUM CHLORIDE 0.9 % IV SOLN
500.0000 mg | Freq: Three times a day (TID) | INTRAVENOUS | Status: DC
  Filled 2010-07-16 (×4): qty 5

## 2010-07-16 MED ORDER — SODIUM CHLORIDE 0.9 % IV SOLN
1000.0000 mg | Freq: Three times a day (TID) | INTRAVENOUS | Status: DC
  Administered 2010-07-16 – 2010-07-17 (×3): 1000 mg via INTRAVENOUS
  Filled 2010-07-16 (×4): qty 10

## 2010-07-16 NOTE — ED Notes (Signed)
Report given to Anna, RN 

## 2010-07-16 NOTE — H&P (Signed)
NAME:  ORAL, REMACHE NO.:  [id number]  MEDICAL RECORD NO.:  [id number]  LOCATION:                                 FACILITY:  PHYSICIAN:  Zari Cly L. Juanetta Gosling, M.D.DATE OF BIRTH:  December 03, 1945  DATE OF ADMISSION:  07/16/2010 DATE OF DISCHARGE:  LH                             HISTORY & PHYSICAL   HISTORY OF PRESENT ILLNESS:  Jeffrey Sweeney is a 65 year old who came to the emergency room because of blackouts.  He has a long known history of severe orthostatic hypotension and has had to be hospitalized with that on several occasions.  His son who is with him reports that he has had episodes to the left when he had these blackouts and apparently has had some seizure disorder that he described to Dr. Gerilyn Pilgrim, but he did not describe to me.  He has a left hemiparesis from a previous stroke.  He has not had episodes of turning the left hand shaking until the last 2-3 weeks as best I can tell.  PAST MEDICAL HISTORY:  Positive for a stroke, coronary artery disease, anxiety and depression, hyperlipidemia, severe orthostatic hypotension, GERD, and chronic low back pain.  SOCIAL HISTORY:  Told me in the office last week that he had stopped smoking but he says he smokes a half-pack a pack of cigarettes daily now.  He drinks alcohol occasionally.  FAMILY HISTORY:  Positive for hypertension and apparently for stroke.  REVIEW OF SYSTEMS:  Except as mentioned is negative.  PHYSICAL EXAMINATION:  GENERAL:  He does not appear to be in any acute distress.  He smells strongly of tobacco but he says it is because of his family has been smoking. VITAL SIGNS:  His blood pressure 128/85, heart rate is in the 60s, and temperature is 98.2. HEENT:  His pupils are reactive.  Nose and throat are clear.  Mucous membranes are moist. NECK:  Supple without masses.  I do not hear any bruits. CHEST:  Rhonchi bilaterally. HEART:  Regular without gallop. ABDOMEN:  Soft without  masses. EXTREMITIES:  No edema. CENTRAL NERVOUS SYSTEM:  He has a left hemiparesis.  He had CT done in the ER that did not show anything specific.  ASSESSMENT:  He very well may have seizures.  He does have severe orthostasis.  My plan is for him to have Neurology consultation, orthostatic blood pressure checks.  I am going to try to make sure that he has his regular medications and get that list and then decide what to do from there.     Victoire Deans L. Juanetta Gosling, M.D.     ELH/MEDQ  D:  07/16/2010  T:  07/16/2010  Job:  [id number]

## 2010-07-16 NOTE — ED Notes (Signed)
Pt given a coke 

## 2010-07-16 NOTE — ED Notes (Signed)
Pt gone to ct 

## 2010-07-16 NOTE — Consult Note (Signed)
NAME:  Jeffrey Sweeney, Jeffrey Sweeney               ACCOUNT NO.:  [id number]  MEDICAL RECORD NO.:  [id number]  LOCATION:  A314                          FACILITY:  [facility]  PHYSICIAN:  Endya Austin A. Gerilyn Pilgrim, M.D. DATE OF BIRTH:  [birth year]  DATE OF CONSULTATION: DATE OF DISCHARGE:                                CONSULTATION   REASON FOR CONSULTATION:  Falls and also altered mentation.  This is a 65 year old white male who has had a couple of spells of blackouts and fall last week or so.  The history is obtained from the chart and the son and also the patient.  It appears that the patient has had these blackout spell associated with shaking.  The description seems consistent with generalized tonic-clonic events.  The son also reports that he has multiple spells especially the last couple of days when he turns his head to the left and becomes unresponsive, that event seems to last for a couple of minutes or so.  The patient had a stroke about 2 years ago which left him with a left hemipareses  PAST MEDICAL HISTORY:  Stroke couple of years ago, also history of coronary artery disease.  Anxiety disorder, depression, hyperlipidemia, orthotic hypotension, and also history of GERD.  SOCIAL HISTORY:  Smokes a pack of cigarettes per day, consumes about a 0.5 ounces of alcohol a week or so.  Medication history is not available at this time.  I think the staff is trying to get his medications from the pharmacy.  REVIEW OF SYSTEMS:  As stated in the HPI, otherwise negative.  No current shortness of breath, chest pain.  They do not report that his weakness from a stroke is in the worse although he appears to have some clear clonic activity.  Physical examination shows a pleasant man in no acute distress. Temperature 98.2, heart rate 61, respiration 20, blood pressure 128/85. HEENT evaluation, neck is supple.  Head is normocephalic, atraumatic. Abdomen is soft.  Extremities, no significant edema.  Mentation,  the patient  had 2 or 3 spells of clear seizures.  While I was evaluating him, he turned his head to the left with flexion of the left upper extremity and unresponsive for a minute or two.  He comes of it rather quickly and is oriented afterwards.  Cranial nerve evaluation, he has a dense left homonomous hemianopia.  Pupils are 4 mm and reactive. Extraocular movements are full.  Facial muscle strength is symmetric. Tongue is midline.  Uvula midline.  Motor examination shows left upper extremity monoparesis.  He has significant increased tone/spasticity left upper extremity.  Strength is graded as 4/5.  There is also slight increased tone in the left leg.  Strength is relatively normal 4+, 5-. Right side shows normal tone, bulk and strength.  Coordination shows no dysmetria on the right side.  There is no tremors.  Reflexes are preserved throughout, they are actually symmetric.  Plantars are both downgoing.  Sensation is responsive similarly to painful stimuli.  Head CT scan of the brain shows encephalomalacia involving the right hemisphere associated with an old MCA infarct.  There is also chronic ischemic changes and global atrophy.  SIGNIFICANT LABORATORY EVALUATION:  WBC  16, platelet count of 214, sodium 129, potassium 3.7, chloride 92, CO2 26, BUN 17, creatinine 0.9.  ASSESSMENT: 1. Likely multiple seizures.  He seemed to be having complex partial     status epilepticus at this time.  Risk factors likely from his old     right cortical infarct.  The gait impairment/falling is likely due     to full-blown generalized tonic-clonic convulsive seizures which he     had a few days ago. 2. Old cortical infarct. 3. Coronary artery disease. 4. Hypertension.  RECOMMENDATIONS:  I did get the ball rolling with the stat dose of Keppra 1 g.  We will also give him a maintenance dose of at least 500 mg t.i.d. daily.  We will continue to follow the patient.  EEG is also ordered.  The patient  should be on antiplatelet agent for secondary stroke prevention and coronary protection.  We will continue to follow the patient with you.  Thanks for this consultation.     Kailee Essman A. Gerilyn Pilgrim, M.D.     KAD/MEDQ  D:  07/16/2010  T:  07/16/2010  Job:  [id number]

## 2010-07-16 NOTE — ED Provider Notes (Signed)
History     Chief Complaint  Patient presents with  . Loss of Consciousness  . Head Injury   Patient is a 65 y.o. male presenting with syncope. The history is provided by the nursing home. The history is limited by the absence of a caregiver and the condition of the patient.  Loss of Consciousness This is a chronic problem. Episode onset: for 2 months. The problem occurs daily. The problem has been gradually worsening. Pertinent negatives include no chest pain, no abdominal pain and no shortness of breath. The symptoms are aggravated by nothing. The symptoms are relieved by nothing. He has tried nothing for the symptoms.   Pt shakes before he passes out;  Left armweakness form previous cva Past Medical History  Diagnosis Date  . Stroke   . Heart disease   . CAD (coronary artery disease)     History reviewed. No pertinent past surgical history.  History reviewed. No pertinent family history.  History  Substance Use Topics  . Smoking status: Current Everyday Smoker -- 2.0 packs/day    Types: Cigarettes  . Smokeless tobacco: Not on file  . Alcohol Use: No      Review of Systems  Unable to perform ROS Respiratory: Negative for shortness of breath.   Cardiovascular: Positive for syncope. Negative for chest pain.  Gastrointestinal: Negative for abdominal pain.    Physical Exam  BP 115/72  Pulse 67  Temp(Src) 99.2 F (37.3 C) (Oral)  Resp 20  Ht 5\' 8"  (1.727 m)  Wt 226 lb (102.513 kg)  BMI 34.36 kg/m2  SpO2 94%  Physical Exam  Nursing note and vitals reviewed. Constitutional: He appears well-developed and well-nourished.  HENT:  Head: Normocephalic and atraumatic.  Eyes: Conjunctivae and EOM are normal. Pupils are equal, round, and reactive to light.  Neck: Normal range of motion. Neck supple.  Cardiovascular: Normal rate and regular rhythm.   Pulmonary/Chest: Effort normal and breath sounds normal.  Abdominal: Soft. Bowel sounds are normal.  Musculoskeletal:       unable  Neurological:       unable  Skin: Skin is warm and dry.  Psychiatric:       unable    ED Course  Procedures  MDM Pt is ill c unknown dx;  admit  Results for orders placed during the hospital encounter of 07/15/10  CBC      Component Value Range   WBC 16.2 (*) 4.0 - 10.5 (K/uL)   RBC 5.17  4.22 - 5.81 (MIL/uL)   Hemoglobin 17.0  13.0 - 17.0 (g/dL)   HCT 16.1  09.6 - 04.5 (%)   MCV 95.7  78.0 - 100.0 (fL)   MCH 32.9  26.0 - 34.0 (pg)   MCHC 34.3  30.0 - 36.0 (g/dL)   RDW 40.9  81.1 - 91.4 (%)   Platelets 214  150 - 400 (K/uL)  DIFFERENTIAL      Component Value Range   Neutrophils Relative 66  43 - 77 (%)   Neutro Abs 10.6 (*) 1.7 - 7.7 (K/uL)   Lymphocytes Relative 25  12 - 46 (%)   Lymphs Abs 4.0  0.7 - 4.0 (K/uL)   Monocytes Relative 9  3 - 12 (%)   Monocytes Absolute 1.5 (*) 0.1 - 1.0 (K/uL)   Eosinophils Relative 0  0 - 5 (%)   Eosinophils Absolute 0.1  0.0 - 0.7 (K/uL)   Basophils Relative 0  0 - 1 (%)   Basophils Absolute 0.0  0.0 - 0.1 (K/uL)  COMPREHENSIVE METABOLIC PANEL      Component Value Range   Sodium 129 (*) 135 - 145 (mEq/L)   Potassium 3.7  3.5 - 5.1 (mEq/L)   Chloride 92 (*) 96 - 112 (mEq/L)   CO2 26  19 - 32 (mEq/L)   Glucose, Bld 104 (*) 70 - 99 (mg/dL)   BUN 17  6 - 23 (mg/dL)   Creatinine, Ser 1.61  0.50 - 1.35 (mg/dL)   Calcium 9.7  8.4 - 09.6 (mg/dL)   Total Protein 8.2  6.0 - 8.3 (g/dL)   Albumin 3.8  3.5 - 5.2 (g/dL)   AST 16  0 - 37 (U/L)   ALT 6  0 - 53 (U/L)   Alkaline Phosphatase 121 (*) 39 - 117 (U/L)   Total Bilirubin 0.6  0.3 - 1.2 (mg/dL)   GFR calc non Af Amer >60  >60 (mL/min)   GFR calc Af Amer >60  >60 (mL/min)  URINALYSIS, ROUTINE W REFLEX MICROSCOPIC      Component Value Range   Color, Urine YELLOW  YELLOW    Appearance CLEAR  CLEAR    Specific Gravity, Urine >1.030 (*) 1.005 - 1.030    pH 5.5  5.0 - 8.0    Glucose, UA NEGATIVE  NEGATIVE (mg/dL)   Hgb urine dipstick NEGATIVE  NEGATIVE    Bilirubin Urine  SMALL (*) NEGATIVE    Ketones, ur TRACE (*) NEGATIVE (mg/dL)   Protein, ur NEGATIVE  NEGATIVE (mg/dL)   Urobilinogen, UA 2.0 (*) 0.0 - 1.0 (mg/dL)   Nitrite NEGATIVE  NEGATIVE    Leukocytes, UA NEGATIVE  NEGATIVE   BLOOD GAS, ARTERIAL      Component Value Range   FIO2 0.28     O2 Content, Ven 2.0     Delivery systems NASAL CANNULA     pH, Arterial 7.432  7.350 - 7.450    pCO2 40.5  35.0 - 45.0 (mmHg)   pO2, Arterial 70.2 (*) 80.0 - 100.0 (mmHg)   Bicarbonate 26.5 (*) 20.0 - 24.0 (mEq/L)   Acid-Base Excess 2.5 (*) 0.0 - 2.0 (mmol/L)   O2 Saturation 93.5     Patient temperature 37.0     Collection site RIGHT RADIAL     Drawn by [id number]     Sample type ARTERIAL     Allens test (pass/fail) PASS  PASS   MRSA PCR SCREENING      Component Value Range   MRSA by PCR POSITIVE (*) NEGATIVE   COMPREHENSIVE METABOLIC PANEL      Component Value Range   Sodium 138  135 - 145 (mEq/L)   Potassium 3.7  3.5 - 5.1 (mEq/L)   Chloride 105  96 - 112 (mEq/L)   CO2 27  19 - 32 (mEq/L)   Glucose, Bld 79  70 - 99 (mg/dL)   BUN 10  6 - 23 (mg/dL)   Creatinine, Ser 0.45  0.50 - 1.35 (mg/dL)   Calcium 8.6  8.4 - 40.9 (mg/dL)   Total Protein 6.6  6.0 - 8.3 (g/dL)   Albumin 2.9 (*) 3.5 - 5.2 (g/dL)   AST 39 (*) 0 - 37 (U/L)   ALT 5  0 - 53 (U/L)   Alkaline Phosphatase 101  39 - 117 (U/L)   Total Bilirubin 0.7  0.3 - 1.2 (mg/dL)   GFR calc non Af Amer >60  >60 (mL/min)   GFR calc Af Amer >60  >60 (mL/min)  BLOOD GAS, ARTERIAL  Component Value Range   O2 Content, Ven 4.0     Delivery systems NASAL CANNULA     pH, Arterial 7.419  7.350 - 7.450    pCO2 40.2  35.0 - 45.0 (mmHg)   pO2, Arterial 67.0 (*) 80.0 - 100.0 (mmHg)   Bicarbonate 25.5 (*) 20.0 - 24.0 (mEq/L)   TCO2 21.9  0 - 100 (mmol/L)   Acid-Base Excess 1.5  0.0 - 2.0 (mmol/L)   O2 Saturation 92.6     Patient temperature 37.0     Collection site RIGHT RADIAL     Drawn by COLLECTED BY RT     Sample type ARTERIAL     Allens test  (pass/fail) PASS  PASS   CBC      Component Value Range   WBC 11.2 (*) 4.0 - 10.5 (K/uL)   RBC 4.69  4.22 - 5.81 (MIL/uL)   Hemoglobin 15.1  13.0 - 17.0 (g/dL)   HCT 54.0  98.1 - 19.1 (%)   MCV 96.6  78.0 - 100.0 (fL)   MCH 32.2  26.0 - 34.0 (pg)   MCHC 33.3  30.0 - 36.0 (g/dL)   RDW 47.8  29.5 - 62.1 (%)   Platelets 240  150 - 400 (K/uL)  DIFFERENTIAL      Component Value Range   Neutrophils Relative 54  43 - 77 (%)   Neutro Abs 6.1  1.7 - 7.7 (K/uL)   Lymphocytes Relative 34  12 - 46 (%)   Lymphs Abs 3.8  0.7 - 4.0 (K/uL)   Monocytes Relative 8  3 - 12 (%)   Monocytes Absolute 0.9  0.1 - 1.0 (K/uL)   Eosinophils Relative 3  0 - 5 (%)   Eosinophils Absolute 0.4  0.0 - 0.7 (K/uL)   Basophils Relative 0  0 - 1 (%)   Basophils Absolute 0.0  0.0 - 0.1 (K/uL)  VALPROIC ACID LEVEL      Component Value Range   Valproic Acid Lvl 36.6 (*) 50.0 - 100.0 (ug/mL)  CBC      Component Value Range   WBC 11.0 (*) 4.0 - 10.5 (K/uL)   RBC 4.70  4.22 - 5.81 (MIL/uL)   Hemoglobin 15.0  13.0 - 17.0 (g/dL)   HCT 30.8  65.7 - 84.6 (%)   MCV 95.7  78.0 - 100.0 (fL)   MCH 31.9  26.0 - 34.0 (pg)   MCHC 33.3  30.0 - 36.0 (g/dL)   RDW 96.2  95.2 - 84.1 (%)   Platelets 236  150 - 400 (K/uL)  DIFFERENTIAL      Component Value Range   Neutrophils Relative 57  43 - 77 (%)   Neutro Abs 6.3  1.7 - 7.7 (K/uL)   Lymphocytes Relative 33  12 - 46 (%)   Lymphs Abs 3.6  0.7 - 4.0 (K/uL)   Monocytes Relative 6  3 - 12 (%)   Monocytes Absolute 0.7  0.1 - 1.0 (K/uL)   Eosinophils Relative 4  0 - 5 (%)   Eosinophils Absolute 0.4  0.0 - 0.7 (K/uL)   Basophils Relative 0  0 - 1 (%)   Basophils Absolute 0.0  0.0 - 0.1 (K/uL)  BASIC METABOLIC PANEL      Component Value Range   Sodium 137  135 - 145 (mEq/L)   Potassium 3.8  3.5 - 5.1 (mEq/L)   Chloride 104  96 - 112 (mEq/L)   CO2 27  19 - 32 (mEq/L)   Glucose,  Bld 76  70 - 99 (mg/dL)   BUN 9  6 - 23 (mg/dL)   Creatinine, Ser 1.19  0.50 - 1.35 (mg/dL)    Calcium 8.8  8.4 - 10.5 (mg/dL)   GFR calc non Af Amer >60  >60 (mL/min)   GFR calc Af Amer >60  >60 (mL/min)  VALPROIC ACID LEVEL      Component Value Range   Valproic Acid Lvl 95.0  50.0 - 100.0 (ug/mL)  URINE CULTURE      Component Value Range   Specimen Description URINE, CLEAN CATCH     Special Requests NONE     Setup Time [id number]     Colony Count >=100,000 COLONIES/ML     Culture ESCHERICHIA COLI     Report Status 07/24/2010 FINAL     Organism ID, Bacteria ESCHERICHIA COLI    URINALYSIS, ROUTINE W REFLEX MICROSCOPIC      Component Value Range   Color, Urine YELLOW  YELLOW    Appearance CLOUDY (*) CLEAR    Specific Gravity, Urine 1.010  1.005 - 1.030    pH 7.5  5.0 - 8.0    Glucose, UA NEGATIVE  NEGATIVE (mg/dL)   Hgb urine dipstick LARGE (*) NEGATIVE    Bilirubin Urine NEGATIVE  NEGATIVE    Ketones, ur NEGATIVE  NEGATIVE (mg/dL)   Protein, ur NEGATIVE  NEGATIVE (mg/dL)   Urobilinogen, UA 0.2  0.0 - 1.0 (mg/dL)   Nitrite NEGATIVE  NEGATIVE    Leukocytes, UA LARGE (*) NEGATIVE   URINE MICROSCOPIC-ADD ON      Component Value Range   WBC, UA 11-20  <3 (WBC/hpf)   RBC / HPF 11-20  <3 (RBC/hpf)   Bacteria, UA MANY (*) RARE   VALPROIC ACID LEVEL      Component Value Range   Valproic Acid Lvl 98.1  50.0 - 100.0 (ug/mL)  CBC      Component Value Range   WBC 11.8 (*) 4.0 - 10.5 (K/uL)   RBC 4.91  4.22 - 5.81 (MIL/uL)   Hemoglobin 15.6  13.0 - 17.0 (g/dL)   HCT 86.5  78.4 - 69.6 (%)   MCV 97.1  78.0 - 100.0 (fL)   MCH 31.8  26.0 - 34.0 (pg)   MCHC 32.7  30.0 - 36.0 (g/dL)   RDW 29.5  28.4 - 13.2 (%)   Platelets 207  150 - 400 (K/uL)  DIFFERENTIAL      Component Value Range   Neutrophils Relative 66  43 - 77 (%)   Neutro Abs 7.8 (*) 1.7 - 7.7 (K/uL)   Lymphocytes Relative 23  12 - 46 (%)   Lymphs Abs 2.7  0.7 - 4.0 (K/uL)   Monocytes Relative 7  3 - 12 (%)   Monocytes Absolute 0.8  0.1 - 1.0 (K/uL)   Eosinophils Relative 4  0 - 5 (%)   Eosinophils Absolute 0.5   0.0 - 0.7 (K/uL)   Basophils Relative 0  0 - 1 (%)   Basophils Absolute 0.0  0.0 - 0.1 (K/uL)  BASIC METABOLIC PANEL      Component Value Range   Sodium 139  135 - 145 (mEq/L)   Potassium 3.8  3.5 - 5.1 (mEq/L)   Chloride 106  96 - 112 (mEq/L)   CO2 27  19 - 32 (mEq/L)   Glucose, Bld 72  70 - 99 (mg/dL)   BUN 5 (*) 6 - 23 (mg/dL)   Creatinine, Ser 4.40  0.50 - 1.35 (mg/dL)  Calcium 8.6  8.4 - 10.5 (mg/dL)   GFR calc non Af Amer >60  >60 (mL/min)   GFR calc Af Amer >60  >60 (mL/min)  AMMONIA - [place] ONLY      Component Value Range   Ammonia 67 (*) 11 - 60 (umol/L)  VALPROIC ACID LEVEL      Component Value Range   Valproic Acid Lvl 46.7 (*) 50.0 - 100.0 (ug/mL)  COMPREHENSIVE METABOLIC PANEL      Component Value Range   Sodium 140  135 - 145 (mEq/L)   Potassium 3.1 (*) 3.5 - 5.1 (mEq/L)   Chloride 105  96 - 112 (mEq/L)   CO2 29  19 - 32 (mEq/L)   Glucose, Bld 75  70 - 99 (mg/dL)   BUN 4 (*) 6 - 23 (mg/dL)   Creatinine, Ser 1.61  0.50 - 1.35 (mg/dL)   Calcium 8.9  8.4 - 09.6 (mg/dL)   Total Protein 6.9  6.0 - 8.3 (g/dL)   Albumin 2.6 (*) 3.5 - 5.2 (g/dL)   AST 12  0 - 37 (U/L)   ALT <5  0 - 53 (U/L)   Alkaline Phosphatase 93  39 - 117 (U/L)   Total Bilirubin 0.3  0.3 - 1.2 (mg/dL)   GFR calc non Af Amer >60  >60 (mL/min)   GFR calc Af Amer >60  >60 (mL/min)  CBC      Component Value Range   WBC 9.6  4.0 - 10.5 (K/uL)   RBC 4.76  4.22 - 5.81 (MIL/uL)   Hemoglobin 15.4  13.0 - 17.0 (g/dL)   HCT 04.5  40.9 - 81.1 (%)   MCV 95.2  78.0 - 100.0 (fL)   MCH 32.4  26.0 - 34.0 (pg)   MCHC 34.0  30.0 - 36.0 (g/dL)   RDW 91.4  78.2 - 95.6 (%)   Platelets 214  150 - 400 (K/uL)  DIFFERENTIAL      Component Value Range   Neutrophils Relative 59  43 - 77 (%)   Neutro Abs 5.7  1.7 - 7.7 (K/uL)   Lymphocytes Relative 27  12 - 46 (%)   Lymphs Abs 2.6  0.7 - 4.0 (K/uL)   Monocytes Relative 10  3 - 12 (%)   Monocytes Absolute 1.0  0.1 - 1.0 (K/uL)   Eosinophils Relative 3  0 -  5 (%)   Eosinophils Absolute 0.3  0.0 - 0.7 (K/uL)   Basophils Relative 0  0 - 1 (%)   Basophils Absolute 0.0  0.0 - 0.1 (K/uL)  BASIC METABOLIC PANEL      Component Value Range   Sodium 140  135 - 145 (mEq/L)   Potassium 3.3 (*) 3.5 - 5.1 (mEq/L)   Chloride 102  96 - 112 (mEq/L)   CO2 28  19 - 32 (mEq/L)   Glucose, Bld 82  70 - 99 (mg/dL)   BUN 8  6 - 23 (mg/dL)   Creatinine, Ser 2.13  0.50 - 1.35 (mg/dL)   Calcium 9.3  8.4 - 08.6 (mg/dL)   GFR calc non Af Amer >60  >60 (mL/min)   GFR calc Af Amer >60  >60 (mL/min)   Ct Head Wo Contrast  07/28/2010  *RADIOLOGY REPORT*  Clinical Data: Altered mental status, seizures.  CT HEAD WITHOUT CONTRAST  Technique:  Contiguous axial images were obtained from the base of the skull through the vertex without contrast.  Comparison: 07/19/2010 MRI 07/16/2010 CT.  Findings: Prominence of the  sulci, cisterns, and ventricles, in keeping with volume loss. There are subcortical and periventricular white matter hypodensities, a nonspecific finding most often seen with chronic microangiopathic changes.  There is right MCA distribution encephalomalacia. Increased hypodensity within the right temporal lobe is in keeping with evolution of ischemia.  There is no evidence for acute hemorrhage, overt hydrocephalus, mass lesion, or abnormal extra-axial fluid collection.  No definite CT evidence for acute cortical based (large artery) infarction, however evaluation of this is significantly limited given the chronic findings described above. Mucosal thickening of the maxillary sinuses.  Partial opacification of the ethmoid air cells.  IMPRESSION:  Increased hypodensity within the right temporal lobe is most in keeping with evolution of subacute ischemia. Attention on follow- up.  No areas of hemorrhage.  Otherwise, the chronic changes are without appreciable change from 07/16/2010.  Original Report Authenticated By: Waneta Martins, M.D.   Mr Brain Wo Contrast  07/31/2010   *RADIOLOGY REPORT*  Clinical Data: Altered mental status.  Recent status epilepticus.  MRI BRAIN WITHOUT CONTRAST  Technique:  Multiplanar, multiecho pulse sequences of the brain and surrounding structures were obtained according to standard protocol without intravenous contrast.  Comparison: Multiple priors, most recent 07/19/2010 MR.  Also CT 07/28/2010.  Findings: Unusual gyriform pattern of restricted diffusion affects the right temporal and right parietal lobes, along with a small focus of restricted diffusion in the right thalamus.  These areas also demonstrated restricted diffusion on the most recent prior MR. Differential remains status epilepticus versus acute cortical infarction.  There is normalization of previously noted right occipital gyriform restricted diffusion.  The right thalamic restricted diffusion today is in a different vascular territory than the cortical hyperintensity and it is unclear if this represents PCA territory ischemia versus a secondary effect on the thalamus from cortical seizures.  T2-weighted images demonstrate cytotoxic edema in the right temporal and parietal lobes.  There is a chronic infarction in this region with hemosiderin deposition.  This appearance is similar to prior CT, but areas of restricted diffusion in the right occipital lobe noted on prior most recent MR have normalized.  Baseline atrophy with chronic microvascular ischemic change.  No midline shift.  No mass lesion or extra-axial fluid.  Chronic sinus disease.  IMPRESSION:  Right hemisphere acute infarction versus postictal phenomenon; see comments above. Remote chronic hemorrhagic right temporoparietal infarct.  Atrophy with chronic microvascular ischemic change. Right thalamic focus of restricted diffusion could represent a different vascular territory (PCA) infarct or be secondary to cortical seizure activity.  Original Report Authenticated By: Elsie Stain, M.D.   Mr Brain Wo Contrast  07/19/2010   *RADIOLOGY REPORT*  Clinical Data: 65 year old male with new seizure activity.  History of stroke.  MRI BRAIN WITHOUT CONTRAST  Technique:  Multiplanar, multiecho pulse sequences of the brain and surrounding structures were obtained according to standard protocol without intravenous contrast.  Comparison: 07/16/2010 and earlier.  Findings: Study is intermittently degraded by motion artifact despite repeated imaging attempts.  This patient has chronic right parietal and posterior frontal lobe encephalomalacia from a moderate sized previous right MCA / PCA infarct. Diffusion images today are abnormal throughout the cortex of the right parietal lobe, with restriction in a gyriform pattern. There is new cytotoxic edema in the same distribution. The most inferior and medial right parietal lobe is affected ( series 3 image 13), segment which previously was not scoliotic. Stable chronic blood products at the encephalomalacia. No acute intracranial hemorrhage identified.  No significant mass effect despite  the acute edema.  Diffusion elsewhere is within normal limits.  No ventriculomegaly, mass lesion or extra-axial collection.  Stable and negative pituitary, cervicomedullary junction visualized cervical spine. Brain stem is within normal limits.  There is a small chronic lacunar infarct in the left lateral cerebellum which is unchanged.  Major intracranial vascular flow voids are stable.  Visualized bone marrow signal is within normal limits.  Chronic changes to the paranasal sinuses again noted.  Mastoids are clear. Orbit and scalp soft tissues appear stable.  IMPRESSION: 1.  Intense diffusion abnormality in a gyriform pattern with cytotoxic edema affecting both the chronically encephalomalacic right parietal lobe, and also adjacent previously normal brain.  No associated mass effect or acute hemorrhage. Favor these findings reflect acute seizure activity (status epilepticus).  Acute-on-chronic ischemia / infarction in  these areas is not favored but cannot be excluded. 2.  Intermittently motion degraded study despite repeated imaging attempts, with other areas of the brain appearing stable since 2011.  Case discussed by telephone with Dr. Shaune Pollack at 0940 hours on 07/19/2010.  Original Report Authenticated By: Harley Hallmark, M.D.   Dg Chest Portable 1 View  07/27/2010  *RADIOLOGY REPORT*  Clinical Data: Altered mental status.  PORTABLE CHEST - 1 VIEW  Comparison: Chest x-ray 07/16/2010.  Findings: The heart is borderline enlarged but stable.  The mediastinal and hilar contours are stable.  There are mild chronic bronchitic type lung changes but no acute pulmonary findings.  No pleural effusion.  The bony thorax is intact.  IMPRESSION: Mild chronic bronchitic type lung changes but no acute pulmonary findings.  Original Report Authenticated By: P. Loralie Champagne, M.D.      Date: 07/16/2010  Rate: 72  Rhythm: normal sinus rhythm  QRS Axis: normal  Intervals: normal  ST/T Wave abnormalities: normal  Conduction Disutrbances:none  Narrative Interpretation:   Old EKG Reviewed: none available PAC's  Donnetta Hutching, MD 08/15/10 2150

## 2010-07-16 NOTE — ED Notes (Signed)
Pt advised of urine specimen.

## 2010-07-16 NOTE — ED Notes (Signed)
Pt taken to xray 

## 2010-07-17 DIAGNOSIS — K219 Gastro-esophageal reflux disease without esophagitis: Secondary | ICD-10-CM | POA: Insufficient documentation

## 2010-07-17 DIAGNOSIS — F341 Dysthymic disorder: Secondary | ICD-10-CM | POA: Insufficient documentation

## 2010-07-17 DIAGNOSIS — I679 Cerebrovascular disease, unspecified: Secondary | ICD-10-CM | POA: Insufficient documentation

## 2010-07-17 DIAGNOSIS — I639 Cerebral infarction, unspecified: Secondary | ICD-10-CM | POA: Insufficient documentation

## 2010-07-17 DIAGNOSIS — I2510 Atherosclerotic heart disease of native coronary artery without angina pectoris: Secondary | ICD-10-CM | POA: Insufficient documentation

## 2010-07-17 DIAGNOSIS — G40909 Epilepsy, unspecified, not intractable, without status epilepticus: Secondary | ICD-10-CM | POA: Diagnosis present

## 2010-07-17 DIAGNOSIS — I10 Essential (primary) hypertension: Secondary | ICD-10-CM | POA: Insufficient documentation

## 2010-07-17 DIAGNOSIS — E785 Hyperlipidemia, unspecified: Secondary | ICD-10-CM | POA: Insufficient documentation

## 2010-07-17 LAB — MRSA PCR SCREENING: MRSA by PCR: POSITIVE — AB

## 2010-07-17 MED ORDER — SODIUM CHLORIDE 0.9 % IV SOLN
1500.0000 mg | Freq: Three times a day (TID) | INTRAVENOUS | Status: DC
  Administered 2010-07-17 – 2010-07-20 (×10): 1500 mg via INTRAVENOUS
  Filled 2010-07-17 (×21): qty 15

## 2010-07-17 MED ORDER — CHLORHEXIDINE GLUCONATE CLOTH 2 % EX PADS
6.0000 | MEDICATED_PAD | Freq: Every day | CUTANEOUS | Status: AC
  Administered 2010-07-18 – 2010-07-20 (×3): 6 via TOPICAL

## 2010-07-17 MED ORDER — SIMVASTATIN 20 MG PO TABS
20.0000 mg | ORAL_TABLET | Freq: Every day | ORAL | Status: DC
  Administered 2010-07-17 – 2010-07-27 (×11): 20 mg via ORAL
  Filled 2010-07-17 (×11): qty 1

## 2010-07-17 MED ORDER — VALPROATE SODIUM 500 MG/5ML IV SOLN
1000.0000 mg | INTRAVENOUS | Status: AC
  Administered 2010-07-17: 1000 mg via INTRAVENOUS
  Filled 2010-07-17: qty 10

## 2010-07-17 MED ORDER — PHENELZINE SULFATE 15 MG PO TABS
30.0000 mg | ORAL_TABLET | Freq: Three times a day (TID) | ORAL | Status: DC
  Administered 2010-07-17 – 2010-07-27 (×27): 30 mg via ORAL
  Filled 2010-07-17 (×41): qty 2

## 2010-07-17 MED ORDER — CLONAZEPAM 0.5 MG PO TABS
0.5000 mg | ORAL_TABLET | Freq: Three times a day (TID) | ORAL | Status: DC
  Administered 2010-07-17 – 2010-07-23 (×17): 0.5 mg via ORAL
  Filled 2010-07-17 (×17): qty 1

## 2010-07-17 MED ORDER — ZOLPIDEM TARTRATE 5 MG PO TABS: 10.0000 mg | ORAL_TABLET | Freq: Every evening | ORAL | Status: DC | PRN

## 2010-07-17 MED ORDER — NITROGLYCERIN 0.4 MG SL SUBL: 0.4000 mg | SUBLINGUAL_TABLET | SUBLINGUAL | Status: DC | PRN

## 2010-07-17 MED ORDER — PANTOPRAZOLE SODIUM 40 MG PO TBEC
40.0000 mg | DELAYED_RELEASE_TABLET | Freq: Every day | ORAL | Status: DC
  Administered 2010-07-17 – 2010-07-27 (×11): 40 mg via ORAL
  Filled 2010-07-17 (×11): qty 1

## 2010-07-17 MED ORDER — HYDROCODONE-ACETAMINOPHEN 5-325 MG PO TABS: 1.5000 | ORAL_TABLET | Freq: Four times a day (QID) | ORAL | Status: DC | PRN

## 2010-07-17 MED ORDER — DIVALPROEX SODIUM 250 MG PO DR TAB
500.0000 mg | DELAYED_RELEASE_TABLET | Freq: Two times a day (BID) | ORAL | Status: DC
  Administered 2010-07-17 – 2010-07-19 (×4): 500 mg via ORAL
  Filled 2010-07-17 (×4): qty 2

## 2010-07-17 MED ORDER — ENOXAPARIN SODIUM 40 MG/0.4ML ~~LOC~~ SOLN
40.0000 mg | SUBCUTANEOUS | Status: DC
  Administered 2010-07-17 – 2010-07-26 (×10): 40 mg via SUBCUTANEOUS
  Filled 2010-07-17 (×10): qty 0.4

## 2010-07-17 MED ORDER — CHLORHEXIDINE GLUCONATE CLOTH 2 % EX PADS: 6.0000 | MEDICATED_PAD | Freq: Every day | CUTANEOUS | Status: DC

## 2010-07-17 MED ORDER — SODIUM CHLORIDE 0.9 % IV SOLN
1500.0000 mg | Freq: Two times a day (BID) | INTRAVENOUS | Status: DC
  Filled 2010-07-17: qty 15

## 2010-07-17 MED ORDER — MUPIROCIN 2 % EX OINT
1.0000 "application " | TOPICAL_OINTMENT | Freq: Two times a day (BID) | CUTANEOUS | Status: AC
  Administered 2010-07-18 – 2010-07-22 (×10): 1 via NASAL
  Filled 2010-07-17: qty 22

## 2010-07-17 MED ORDER — LEVETIRACETAM 500 MG/5ML IV SOLN
INTRAVENOUS | Status: AC
  Administered 2010-07-17
  Filled 2010-07-17: qty 15

## 2010-07-17 MED ORDER — HYDROCODONE-ACETAMINOPHEN 7.5-325 MG PO TABS: 1.0000 | ORAL_TABLET | Freq: Four times a day (QID) | ORAL | Status: DC | PRN

## 2010-07-17 NOTE — Progress Notes (Signed)
Transferred to ICU-08 with oxygen @ 3L/ in stable condition, reported to J. Theda Belfast, RN

## 2010-07-17 NOTE — Progress Notes (Signed)
Subjective: Interval History: has  complaint of any seizures in the last 24 hours. He has been taking Keppra intravenously and it seems to be working. This morning he said he didn't sleep well and has a headache but otherwise has no new complaints. .   Objective: Vital signs in last 24 hours: Temp:  [97.6 F (36.4 C)-98.5 F (36.9 C)] 97.6 F (36.4 C) (07/10 0456) Pulse Rate:  [55-67] 56  (07/10 0456) Resp:  [18-20] 20  (07/10 0456) BP: (112-134)/(72-81) 130/72 mmHg (07/10 0456) SpO2:  [87 %-94 %] 87 % (07/10 0456)  Intake/Output from previous day: 07/09 0701 - 07/10 0700 In: 4010 [P.O.:480; I.V.:3310] Out: 600 [Urine:600] Intake/Output this shift:      General appearance: alert, appears older than stated age and no distress Resp: clear to auscultation bilaterally Cardio: regular rate and rhythm, S1, S2 normal, no murmur, click, rub or gallop GI: soft, non-tender; bowel sounds normal; no masses,  no organomegaly Extremities: extremities normal, atraumatic, no cyanosis or edema Neurologic: Mental status: Alert, oriented, thought content appropriate Motor: left hemiparesis left Gait: Abnormal   No results found for this or any previous visit (from the past 24 hour(s)).  Studies/Results: Dg Chest 2 View  07/16/2010  *RADIOLOGY REPORT*  Clinical Data: Dizziness, weakness, short of breath, and stroke.  CHEST - 2 VIEW  Comparison: 09/01/2009  Findings: The heart size and pulmonary vascularity are normal. The lungs appear clear and expanded without focal air space disease or consolidation. No blunting of the costophrenic angles.  No significant change since the previous study.  IMPRESSION: No evidence of active pulmonary disease.  Original Report Authenticated By: Marlon Pel, M.D.   Ct Head Wo Contrast  07/16/2010  *RADIOLOGY REPORT*  Clinical Data: Head injury.  Loss of consciousness.  Dizziness.  CT HEAD WITHOUT CONTRAST  Technique:  Contiguous axial images were obtained from  the base of the skull through the vertex without contrast.  Comparison: 09/01/2009.  04/03/2009.  Findings: Mucous retention cyst or polyps are present in both maxillary sinuses.  Ethmoid mucosal thickening.  Mastoid air cells clear.  No skull fracture.  Intracranial atherosclerosis is present.  Right posterior frontal and parietal encephalomalacia is unchanged compared to 2011.  Mild atrophy and chronic ischemic white matter disease. No mass lesion, mass effect, midline shift, hydrocephalus, hemorrhage.  No territorial ischemia or acute infarction.  IMPRESSION: Stable CT head with atrophy, chronic ischemic white matter disease and right cerebral hemispheric encephalomalacia associated with old right MCA infarct.  Chronic paranasal sinus disease.  Original Report Authenticated By: Andreas Newport, M.D.    Scheduled Meds:   . aspirin  325 mg Oral Daily  . BIOTENE DRY MOUTH   Mouth Rinse BID  . levetiracetam  1,000 mg Intravenous Once  . levetiracetam  1,000 mg Intravenous Q8H  . midodrine  10 mg Oral TID WC  . DISCONTD: levetiracetam  500 mg Intravenous Q8H   Continuous Infusions:   . sodium chloride 125 mL/hr at 07/17/10 0534   PRN Meds:LORazepam  Assessment/Plan:  Patient Active Problem List  Diagnoses  . HYPERLIPIDEMIA  . SPLENIC INFARCTION  . ANXIETY DEPRESSION  . HYPERTENSION  . CAD  . CORONARY ATHEROSCLEROSIS NATIVE CORONARY ARTERY  . STROKE  . CEREBROVASCULAR DISEASE  . EMBOLISM&THROMBOSIS OF OTHER SPECIFIED ARTERY  . Orthostatic hypotension  . GERD  . LOW BACK PAIN, MILD  . SYNCOPE  . EARLY SATIETY  . WEIGHT LOSS, ABNORMAL  . CHEST PAIN  . DIARRHEA  . LUQ  PAIN  . BIPOLAR AFFECTIVE DISORDER, HX OF  . PERSONAL HISTORY OF COLONIC POLYPS  . Seizure disorder   Plan: He will continue with IV Keppra and I will discuss with Dr. Gerilyn Pilgrim needs to have MRI scanning or not continue with all of his other medications and treatments and continued.   LOS: 2 days    Leo Weyandt L

## 2010-07-17 NOTE — Progress Notes (Signed)
Subjective: The patient unfortunately has had multiple events of last 24 hours. The nurse reports that he had mobile events throughout the day. He also continued to have the events and nighttime. Again, the patient's are stereotype with the patient turning towards the left and associated with altered mentation and unresponsiveness. It appears a more recently he has not had the altered mentation but has been responsive. However, he has developed more clonic activity of the left side especially the left leg. He has tolerated the Keppra which was increased to 1000 mg t.i.d.    Objective: Vital signs in last 24 hours: Temp:  [97.6 F (36.4 C)-98.3 F (36.8 C)] 97.6 F (36.4 C) (07/10 0456) Pulse Rate:  [55-67] 56  (07/10 0456) Resp:  [18-20] 20  (07/10 0456) BP: (112-134)/(72-81) 130/72 mmHg (07/10 0456) SpO2:  [87 %-94 %] 93 % (07/10 0905)  Intake/Output from previous day: 07/09 0701 - 07/10 0700 In: 4010 [P.O.:480; I.V.:3310; IV Piggyback:220] Out: 600 [Urine:600] Intake/Output this shift:   Nutritional status: General  PE The patient has been evaluated and had several events while being evaluated. He had at least 3 events. The patient turning to the left with shaking of the left leg and the stiffening of the left upper extremity. He was responsive throughout the event however. He could follow commands. He continues to have impairment of visual field on the left side. Pupils are reactive. Speech is somewhat dysarthric particular since he is having several of these events. Neck is supple. Head is normocephalic. Abdomen soft. Extremities shows no edema. He does have normal tone bulk and strength of the right side.  Lab Results:  Basename 07/16/10  WBC 16.2*  HGB 17.0  HCT 49.5  PLT 214  NA 129*  K 3.7  CL 92*  CO2 26  GLUCOSE 104*  BUN 17  CREATININE 0.96  CALCIUM 9.7  LABA1C --   Lipid Panel No results found for this basename: CHOL,TRIG,HDL,CHOLHDL,VLDL,LDLCALC in the last  72 hours  Studies/Results: No results found.  Medications: Reviewed during transfer orders.   Assessment/Plan: Essentially the patient is having complex partial status epilepticus. I did have a discussion with the patient, his family and also Dr. Juanetta Gosling. The patient is having multiple of these events an EEG shows that he has diffuse slowing after these events indicating that they are epileptic. The patient will be transferred to the ICU. Upper part orders were made for the patient to be transferred to the ICU. This was thought to be in his best interest especially since he will be given multiple medications for these events and he has a baseline issue with COPD. The Keppra will be increased to 1500 mg t.i.d. given IV. He will be given a bolus dose of IV valproic acid 1000 mg and also oral maintenance dose. We will also start patient on clonazepam 0.5 mg t.i.d. We will hold the Ativan for the most part although he was given a one-time IV dose this morning because of having multiple spells. Extensive time was spent for management of the patient and transfer to the ICU. Approximate time is for 50 minutes.   LOS: 2 days   Porchea Charrier

## 2010-07-17 NOTE — Progress Notes (Signed)
eLink Physician-Brief Progress Note Patient Name: JONATAN WILSEY DOB: [birth year] MRN: [id number]  Date of Service  07/17/2010   HPI/Events of Note   xxxx  eICU Interventions     Intervention Category Intermediate Interventions: Best-practice therapies (e.g. DVT, beta blocker, etc.)  Jesson Foskey 07/17/2010, 5:53 PM

## 2010-07-17 NOTE — Procedures (Signed)
NAME:  FRENCH, KENDRA               ACCOUNT NO.:  [id number]  MEDICAL RECORD NO.:  [id number]  LOCATION:  EE                           FACILITY:  [facility]  PHYSICIAN:  Yousuf Ager A. Gerilyn Pilgrim, M.D. DATE OF BIRTH:  1945/11/27  DATE OF PROCEDURE: DATE OF DISCHARGE:  07/16/2010                             EEG INTERPRETATION   POLYSOMNOGRAPHY REPORT  INDICATIONS:  A 65 year old who has recurrent spells of unresponsiveness.  Head turning to the left and stiffening and shaking consistent with seizures.  MEDICATIONS: 1. Aspirin. 2. Recently started on Keppra.  Now, there is 16-channel recording conducted for approximately 20 minutes.  There is a posterior background activity on the left side of 8.5 Hz and on the right of 6.5 Hz.  Photic stimulation is carried out and did induce the event with the patient turning his head to one side. The event is associated with rhythmic delta activity diffusely, especially frontal areas of 2.5-3 Hz, and associated with increased beta activity involving the right hemisphere.  The event goes on for couple of minutes and is associated with more diffuse regular delta slowing. Afterwards, the patient had three of these events.  Initial one was induced by photic stimulation.  IMPRESSION: Abnormal recording due to the following   1. 3 electrographic seizures. 2. Right hemispheric slowing.     Kavan Devan A. Gerilyn Pilgrim, M.D.     KAD/MEDQ  D:  07/17/2010  T:  07/17/2010  Job:  [id number]

## 2010-07-18 LAB — COMPREHENSIVE METABOLIC PANEL
ALT: 5 U/L (ref 0–53)
AST: 39 U/L — ABNORMAL HIGH (ref 0–37)
CO2: 27 mEq/L (ref 19–32)
Calcium: 8.6 mg/dL (ref 8.4–10.5)
Chloride: 105 mEq/L (ref 96–112)
GFR calc non Af Amer: >60 mL/min (ref >60)
Sodium: 138 mEq/L (ref 135–145)
Total Bilirubin: 0.7 mg/dL (ref 0.3–1.2)

## 2010-07-18 LAB — BLOOD GAS, ARTERIAL
Bicarbonate: 25.5 mEq/L — ABNORMAL HIGH (ref 20.0–24.0)
Patient temperature: 37
TCO2: 21.9 mmol/L (ref 0–100)
pH, Arterial: 7.419 (ref 7.350–7.450)

## 2010-07-18 LAB — CBC
HCT: 45.3 % (ref 39.0–52.0)
Hemoglobin: 15.1 g/dL (ref 13.0–17.0)
MCHC: 33.3 g/dL (ref 30.0–36.0)
RBC: 4.69 MIL/uL (ref 4.22–5.81)

## 2010-07-18 LAB — DIFFERENTIAL
Basophils Relative: 0 % (ref 0–1)
Lymphocytes Relative: 34 % (ref 12–46)
Lymphs Abs: 3.8 10*3/uL (ref 0.7–4.0)
Monocytes Absolute: 0.9 10*3/uL (ref 0.1–1.0)
Monocytes Relative: 8 % (ref 3–12)
Neutro Abs: 6.1 10*3/uL (ref 1.7–7.7)

## 2010-07-18 MED ORDER — SODIUM CHLORIDE 0.9 % IJ SOLN
INTRAMUSCULAR | Status: AC
  Administered 2010-07-18: 10 mL
  Filled 2010-07-18: qty 10

## 2010-07-18 NOTE — Progress Notes (Signed)
NAME:  Jeffrey Sweeney, Jeffrey Sweeney NO.:  [id number]  MEDICAL RECORD NO.:  [id number]  LOCATION:  EE                           FACILITY:  [facility]  PHYSICIAN:  Harmonie Verrastro L. Juanetta Gosling, M.D.DATE OF BIRTH:  June 19, 1945  DATE OF PROCEDURE: DATE OF DISCHARGE:  07/16/2010                                PROGRESS NOTE   Jeffrey Sweeney was admitted with multiple syncopal episodes then it turns out that when he is doing is having seizures.  It was difficult to get his seizures under control, so he was brought to the Intensive Care Unit, so that he can be more closely monitored.  He is better.  He is having some movement of his legs, but he is not having movement of his head and arm today.  He is much more alert and awake than yesterday.  His physical examination otherwise shows that his temperature is 98.5, pulse is 53, respirations 16, blood pressure 144/70 and O2 sats 93% on room air.  His chest is clear.  His heart is regular without gallop. His abdomen is soft and he overall looks more comfortable.  His white blood count is 11,000, hemoglobin is 15 and platelets 240. His electrolytes are much better.  His sodium on the 9th was 129, today 138, potassium 3.7, chloride is 105.  His BUN is 10 and creatinine 0.78.  He will continue on Biotene mouthwash, aspirin, Klonopin, Depakote, Lovenox, Keppra, midodrine, Protonix, Phenelzine and simvastatin.  He will continue on his IV fluids and he has p.r.n. medications ordered as well.  I do not plan to change any of his treatments right now.  I think overall he is better.  I will talk to Dr. Gerilyn Pilgrim and see if he thinks the MRI scanning is appropriate.  I am somewhat concerned that he has had another stroke that did not show on his previous CT scan.     Jeffrey Sweeney L. Juanetta Gosling, M.D.     ELH/MEDQ  D:  07/18/2010  T:  07/18/2010  Job:  [id number]

## 2010-07-18 NOTE — Progress Notes (Signed)
Subjective: Since the patient has been transferred to the ICU, it appears he has had no further recurrent seizures. Her, he has been quite drowsy.  Objective: Vital signs in last 24 hours: Temp:  [98.1 F (36.7 C)-98.9 F (37.2 C)] 98.2 F (36.8 C) (07/11 0800) Pulse Rate:  [45-67] 53  (07/11 0500) Resp:  [13-21] 16  (07/11 0600) BP: (122-177)/(60-83) 144/70 mmHg (07/11 0600) SpO2:  [92 %-97 %] 93 % (07/11 0500) Weight:  [101.4 kg (223 lb 8.7 oz)] 223 lb 8.7 oz (101.4 kg) (07/10 1233)  Intake/Output from previous day: 07/10 0701 - 07/11 0700 In: 3344.2 [P.O.:240; I.V.:2929.2; IV Piggyback:175] Out: 2375 [Urine:2375] Intake/Output this shift:   Nutritional status: General  PE:  HEENT evaluation: Neck is supple head is normocephalic atraumatic.  Abdomen soft.  Extremities: Normal.  Cognition: The patient is sleeping but arousable to light sternal rub. He awakens and speaks in simple sentences. He is oriented to year, month and hospital. He does follow commands briskly on the right side. This  Cranial nerve evaluation: Pupils are reactive. He continues to have a dense left homonymous hemianopia. Extraocular movements are full. Facial muscle strength is symmetric.  Motor examination: He has normal tone bulk and strength on the right side. He has increased tone involving the left upper extremity and the strength is graded as 3-4/5. The left lower extremity is graded as 4/5.  Lab Results:  Basename 07/18/10 0445 07/16/10  WBC 11.2* 16.2*  HGB 15.1 17.0  HCT 45.3 49.5  PLT 240 214  NA 138 129*  K 3.7 3.7  CL 105 92*  CO2 27 26  GLUCOSE 79 104*  BUN 10 17  CREATININE 0.78 0.96  CALCIUM 8.6 9.7  LABA1C -- --   Lipid Panel No results found for this basename: CHOL,TRIG,HDL,CHOLHDL,VLDL,LDLCALC in the last 72 hours  Studies/Results: No results found.  Medications: Current facility-administered medications:0.9 %  sodium chloride infusion, , Intravenous, Continuous,  Fredirick Maudlin, Last Rate: 125 mL/hr at 07/18/10 0600;  aspirin tablet 325 mg, 325 mg, Oral, Daily, Beryle Beams, MD, 325 mg at 07/17/10 0906;  BIOTENE DRY MOUTH (BIOTENE) solution, , Mouth Rinse, BID, Fredirick Maudlin;  Chlorhexidine Gluconate Cloth 2 % PADS 6 each, 6 each, Topical, Daily, Fredirick Maudlin clonazePAM (KLONOPIN) tablet 0.5 mg, 0.5 mg, Oral, TID, Beryle Beams, MD, 0.5 mg at 07/17/10 0930;  divalproex (DEPAKOTE) EC tablet 500 mg, 500 mg, Oral, BID, Beryle Beams, MD, 500 mg at 07/17/10 1006;  enoxaparin (LOVENOX) injection 40 mg, 40 mg, Subcutaneous, Q24H, Fredirick Maudlin, 40 mg at 07/17/10 1842;  HYDROcodone-acetaminophen (NORCO) 5-325 MG per tablet 1.5 tablet, 1.5 tablet, Oral, Q6H PRN, Judie Bonus Hammons, PHARMD levETIRAcetam (KEPPRA) 1,500 mg in sodium chloride 0.9 % 100 mL IVPB, 1,500 mg, Intravenous, TID, Beryle Beams, MD, 1,500 mg at 07/18/10 0006;  levETIRAcetam (KEPPRA) 500 MG/5ML injection, , , , ;  LORazepam (ATIVAN) injection 0.5 mg, 0.5 mg, Intravenous, Q8H PRN, Gebreselassie Nida, 0.5 mg at 07/17/10 0925;  midodrine (PROAMATINE) tablet 10 mg, 10 mg, Oral, TID WC, Fredirick Maudlin, 10 mg at 07/18/10 0751 mupirocin (BACTROBAN) 2 % ointment 1 application, 1 application, Nasal, BID, Fredirick Maudlin, 1 application at 07/18/10 0008;  nitroGLYCERIN (NITROSTAT) SL tablet 0.4 mg, 0.4 mg, Sublingual, Q5 min PRN, Beryle Beams, MD;  pantoprazole (PROTONIX) EC tablet 40 mg, 40 mg, Oral, Daily, Beryle Beams, MD, 40 mg at 07/17/10 1250;  phenelzine (NARDIL) tablet 30 mg, 30 mg, Oral, TID, Beryle Beams, MD,  30 mg at 07/17/10 1600 simvastatin (ZOCOR) tablet 20 mg, 20 mg, Oral, Daily, Beryle Beams, MD, 20 mg at 07/17/10 1250;  valproate (DEPACON) 1,000 mg in dextrose 5 % 50 mL IVPB, 1,000 mg, Intravenous, Q24H, Beryle Beams, MD, 1,000 mg at 07/17/10 1033;  zolpidem (AMBIEN) tablet 10 mg, 10 mg, Oral, QHS PRN, Beryle Beams, MD;  DISCONTD: Chlorhexidine Gluconate Cloth 2 % PADS 6  each, 6 each, Topical, Daily, Fredirick Maudlin DISCONTD: HYDROcodone-acetaminophen (NORCO) 7.5-325 MG per tablet 1 tablet, 1 tablet, Oral, Q6H PRN, Beryle Beams, MD;  DISCONTD: levETIRAcetam (KEPPRA) 1,500 mg in sodium chloride 0.9 % 100 mL IVPB, 1,500 mg, Intravenous, Q12H, Beryle Beams, MD   Assessment/Plan: Resolved complex partial status epilepticus. The patient is on a to drug antiepileptic regimen and also Klonopin. We'll continue these medications for now. Will check a Depakote level. I suggest that we continue monitoring the patient's in the ICU for the next 24 hours. We may consider transfer afterwards. Mild encephalopathy do to likely medication effect.   LOS: 3 days   Kashus Karlen

## 2010-07-19 ENCOUNTER — Inpatient Hospital Stay (HOSPITAL_COMMUNITY): Payer: Medicare Other

## 2010-07-19 LAB — BASIC METABOLIC PANEL
BUN: 9 mg/dL (ref 6–23)
CO2: 27 mEq/L (ref 19–32)
Calcium: 8.8 mg/dL (ref 8.4–10.5)
GFR calc non Af Amer: >60 mL/min (ref >60)
Glucose, Bld: 76 mg/dL (ref 70–99)

## 2010-07-19 LAB — CBC
HCT: 45 % (ref 39.0–52.0)
Hemoglobin: 15 g/dL (ref 13.0–17.0)
MCH: 31.9 pg (ref 26.0–34.0)
MCV: 95.7 fL (ref 78.0–100.0)
RBC: 4.7 MIL/uL (ref 4.22–5.81)

## 2010-07-19 LAB — DIFFERENTIAL
Eosinophils Absolute: 0.4 10*3/uL (ref 0.0–0.7)
Eosinophils Relative: 4 % (ref 0–5)
Lymphocytes Relative: 33 % (ref 12–46)
Lymphs Abs: 3.6 10*3/uL (ref 0.7–4.0)
Monocytes Absolute: 0.7 10*3/uL (ref 0.1–1.0)
Monocytes Relative: 6 % (ref 3–12)

## 2010-07-19 MED ORDER — DIVALPROEX SODIUM 250 MG PO DR TAB
1000.0000 mg | DELAYED_RELEASE_TABLET | Freq: Two times a day (BID) | ORAL | Status: DC
  Administered 2010-07-19 – 2010-07-22 (×7): 1000 mg via ORAL
  Filled 2010-07-19 (×7): qty 4

## 2010-07-19 MED ORDER — SODIUM CHLORIDE 0.9 % IJ SOLN
INTRAMUSCULAR | Status: AC
  Administered 2010-07-19: 10 mL
  Filled 2010-07-19: qty 10

## 2010-07-19 NOTE — Progress Notes (Signed)
Subjective: No seizure reported overnight by the nurses. The patient's daughter however has been in the room today since 12:30. She reports that he has been having spells of his head turning towards the left. However, there has not been any clear type activity of the left side he has had before. She reports that they are not as bad as they were previously. I did witness the patient having a spell of the head turning towards the left on entering the room. The patient did have an MRI which was ordered by Dr. Juanetta Gosling. His concern about the possibility of a new stroke. I did review the MRI in person and there is intense gyral hyperintensity seen in the parietal area on the right side where he had his previous stroke. The hyperintensity along the gyral area extends to the occipital area on the same side. The findings are most consistent with frequent seizures which fits the clinical scenario and EEG findings.     Objective: Vital signs in last 24 hours: Temp:  [98.5 F (36.9 C)] 98.5 F (36.9 C) (07/12 0800) Pulse Rate:  [44-59] 53  (07/12 0800) Resp:  [13-22] 18  (07/12 1300) BP: (116-178)/(59-85) 176/75 mmHg (07/12 1300) SpO2:  [91 %-98 %] 98 % (07/12 0801)  Intake/Output from previous day: 07/11 0701 - 07/12 0700 In: 3230 [I.V.:3000; IV Piggyback:230] Out: 1400 [Urine:1400] Intake/Output this shift: I/O this shift: In: 750 [I.V.:750] Out: 2400 [Urine:2400] Nutritional status: General  PE:  HEENT: Normal  ABDOMEN: soft  EXTREMITIES: No edema   SKIN: Normal by inspection.    MENTAL STATUS:  The patient again had a spell was in the room on entering the ICU. His head was turned to the left. The spells seem to last for about 30 seconds while I was there. The patient is definitely more awake and alert. He is now drowsy as he had previously been on yesterday. He does speak in simple sentences and follow commands briskly.  CRANIAL NERVES: Pupils are equal, round and reactive to light;  extra ocular movements are full, there is no significant nystagmus; again dense left field cut upper and lower facial muscles are normal in strength and symmetric, there is no flattening of the nasolabial folds.  MOTOR: Right side shows normal tone, bulk and strength. He has antigravity strength in the left side with increased tone especially involving the left upper extremity.     Lab Results:  Basename 07/19/10 0435 07/18/10 0445  WBC 11.0* 11.2*  HGB 15.0 15.1  HCT 45.0 45.3  PLT 236 240  NA 137 138  K 3.8 3.7  CL 104 105  CO2 27 27  GLUCOSE 76 79  BUN 9 10  CREATININE 0.75 0.78  CALCIUM 8.8 8.6  LABA1C -- --    BRAIN MRI: 1. Intense diffusion abnormality in a gyriform pattern with  cytotoxic edema affecting both the chronically  encephalomalacic right parietal lobe, and also adjacent previously  normal brain. No associated mass effect or acute hemorrhage.  Favor these findings reflect acute seizure activity (status  epilepticus). Acute-on-chronic ischemia / infarction in these  areas is not favored but cannot be excluded.  2. Intermittently motion degraded study despite repeated imaging  attempts, with other areas of the brain appearing stable since  2011.   Lipid Panel No results found for this basename: CHOL,TRIG,HDL,CHOLHDL,VLDL,LDLCALC in the last 72 hours  Studies/Results: No results found.  Medications: Current facility-administered medications:0.9 %  sodium chloride infusion, , Intravenous, Continuous, Fredirick Maudlin, Last Rate:  125 mL/hr at 07/19/10 1100;  aspirin tablet 325 mg, 325 mg, Oral, Daily, Beryle Beams, MD, 325 mg at 07/19/10 0809;  BIOTENE DRY MOUTH (BIOTENE) solution, , Mouth Rinse, BID, Fredirick Maudlin;  Chlorhexidine Gluconate Cloth 2 % PADS 6 each, 6 each, Topical, Daily, Fredirick Maudlin, 6 each at 07/19/10 1000 clonazePAM (KLONOPIN) tablet 0.5 mg, 0.5 mg, Oral, TID, Beryle Beams, MD, 0.5 mg at 07/19/10 0808;  divalproex (DEPAKOTE) EC  tablet 500 mg, 500 mg, Oral, BID, Beryle Beams, MD, 500 mg at 07/19/10 0808;  enoxaparin (LOVENOX) injection 40 mg, 40 mg, Subcutaneous, Q24H, Fredirick Maudlin, 40 mg at 07/18/10 1730;  HYDROcodone-acetaminophen (NORCO) 5-325 MG per tablet 1.5 tablet, 1.5 tablet, Oral, Q6H PRN, Judie Bonus Hammons, PHARMD levETIRAcetam (KEPPRA) 1,500 mg in sodium chloride 0.9 % 100 mL IVPB, 1,500 mg, Intravenous, TID, Beryle Beams, MD, 1,500 mg at 07/19/10 1047;  LORazepam (ATIVAN) injection 0.5 mg, 0.5 mg, Intravenous, Q8H PRN, Gebreselassie Nida, 0.5 mg at 07/19/10 0807;  midodrine (PROAMATINE) tablet 10 mg, 10 mg, Oral, TID WC, Fredirick Maudlin, 10 mg at 07/19/10 1212 mupirocin (BACTROBAN) 2 % ointment 1 application, 1 application, Nasal, BID, Fredirick Maudlin, 1 application at 07/19/10 1000;  nitroGLYCERIN (NITROSTAT) SL tablet 0.4 mg, 0.4 mg, Sublingual, Q5 min PRN, Beryle Beams, MD;  pantoprazole (PROTONIX) EC tablet 40 mg, 40 mg, Oral, Daily, Beryle Beams, MD, 40 mg at 07/19/10 0809;  phenelzine (NARDIL) tablet 30 mg, 30 mg, Oral, TID, Beryle Beams, MD, 30 mg at 07/19/10 1213 simvastatin (ZOCOR) tablet 20 mg, 20 mg, Oral, Daily, Beryle Beams, MD, 20 mg at 07/19/10 0809;  sodium chloride 0.9 % injection, , , , , 10 mL at 07/18/10 2255;  sodium chloride 0.9 % injection, , , , , 10 mL at 07/19/10 0811;  zolpidem (AMBIEN) tablet 10 mg, 10 mg, Oral, QHS PRN, Beryle Beams, MD   Assessment/Plan: 1. Frequent seizures with resolving complex partial status epilepticus. We will go ahead and increase the patient's Depakote to 1000 mg b.i.d. We'll continue with the Keppra 1500 mg t.i.d. IV. The clonazepam will also be continued. The patient's MRI findings are consistent with status epilepticus. The MRI findings support his clinical picture and EEG findings.    LOS: 4 days   Laurrie Toppin

## 2010-07-19 NOTE — Progress Notes (Signed)
NAME:  Jeffrey Sweeney, Jeffrey Sweeney NO.:  [id number]  MEDICAL RECORD NO.:  [id number]  LOCATION:  EE                           FACILITY:  [facility]  PHYSICIAN:  Arshiya Jakes L. Juanetta Gosling, M.D.DATE OF BIRTH:  [birth year]  DATE OF PROCEDURE: DATE OF DISCHARGE:  07/16/2010                                PROGRESS NOTE   Jeffrey Sweeney has continued to have a little bit of seizure activity, but is generally better.  He is more awake and alert, seems to be feeling better in general and has no new complaints.  His exam shows that his pulse is 58, respirations are 16, blood pressure 132/67, O2 sats 93%.  His pupils are reactive.  Nose and throat are clear.  Mucous membranes are still somewhat dry.  His chest shows rhonchi bilaterally.  His heart is regular without gallop.  His I and O for the last 24 hours is +1455.  Lab work this morning shows his white count is 11,000 approximately what it has been, hemoglobin is 15, platelets 236, BUN is 9, creatinine 0.75.  ASSESSMENT:  I think he is better.  PLAN:  I have discussed again with family and I think we will go ahead with MRI and I had discussed with Dr. Gerilyn Pilgrim earlier.  He was not sure that this is going to be essential into his care, but his family is very concerned about why he has suddenly started having seizures after years of not having anything like that, so I am going to go ahead and order the MRI at this point.  He could probably move to the Intensive Care Unit later today depending on how he does.     Jakiya Bookbinder L. Juanetta Gosling, M.D.     ELH/MEDQ  D:  07/19/2010  T:  07/19/2010  Job:  [id number]

## 2010-07-20 MED ORDER — CLONIDINE HCL 0.1 MG PO TABS
ORAL_TABLET | ORAL | Status: AC
  Administered 2010-07-20: 0.1 mg via ORAL
  Filled 2010-07-20: qty 1

## 2010-07-20 MED ORDER — MAGNESIUM HYDROXIDE 400 MG/5ML PO SUSP
30.0000 mL | Freq: Every day | ORAL | Status: DC | PRN
  Administered 2010-07-20 – 2010-07-22 (×3): 30 mL via ORAL
  Filled 2010-07-20 (×3): qty 30

## 2010-07-20 MED ORDER — CLONIDINE HCL 0.1 MG PO TABS
0.1000 mg | ORAL_TABLET | Freq: Every day | ORAL | Status: DC
  Administered 2010-07-20 – 2010-07-21 (×2): 0.1 mg via ORAL
  Filled 2010-07-20: qty 1

## 2010-07-20 MED ORDER — LEVETIRACETAM 500 MG PO TABS
1500.0000 mg | ORAL_TABLET | Freq: Three times a day (TID) | ORAL | Status: DC
  Administered 2010-07-20 – 2010-07-26 (×16): 1500 mg via ORAL
  Filled 2010-07-20 (×18): qty 3

## 2010-07-20 NOTE — Progress Notes (Signed)
07/20/2010 spoke to daughter cyrstal concerning d/c plans she feels pt may need rehab.gave her cm number to call for assistance. Will need referral for PT.

## 2010-07-20 NOTE — Progress Notes (Signed)
Nursing: leadership rounds. Daughter would like patient assisted with meals. States that he still has some difficulty after his stroke. He was left to feed himself and when she walked in, he had a spoonful of salt to eat. She verbalized understanding that he needs to try to feed himself, would like him supervised.

## 2010-07-20 NOTE — Progress Notes (Signed)
Subjective:  The patient has been transferred to the floor. He was noted to have a couple of events while he was in ICU mostly apparently shaking type activity. No events are reported on the floor this afternoon however. He continues to have some drowsiness. The nurse reported he is in his meals with help.    Objective: Vital signs in last 24 hours: Temp:  [98 F (36.7 C)-98.2 F (36.8 C)] 98.1 F (36.7 C) (07/13 1300) Pulse Rate:  [43-66] 58  (07/13 1300) Resp:  [15-23] 20  (07/13 1300) BP: (118-175)/(61-87) 145/70 mmHg (07/13 1300) SpO2:  [88 %-96 %] 91 % (07/13 1300)  Intake/Output from previous day: 07/12 0701 - 07/13 0700 In: 1615 [I.V.:1500; IV Piggyback:115] Out: 7625 [Urine:7625] Intake/Output this shift: I/O this shift: In: 725 [P.O.:350; I.V.:375] Out: 1800 [Urine:1800] Nutritional status: General  PE:  HEENT: Normal  ABDOMEN: soft  EXTREMITIES: No edema   SKIN: Normal by inspection.    MENTAL STATUS:  Sleeping but arousible to verbal command. He does speak in simple sentences and follow commands briskly. He is oriented to year, month but thinks it is July 8. He speaks in simple sentences. He is most the current and lucid at this time although he continues to be drowsy at times.   CRANIAL NERVES: Pupils are equal, round and reactive to light; extra ocular movements are full, there is no significant nystagmus; again dense left field cut upper and lower facial muscles are normal in strength and symmetric, there is no flattening of the nasolabial folds.  MOTOR: Right side shows normal tone, bulk and strength. He has antigravity strength in the left side with increased tone especially involving the left upper extremity.     Lab Results:  Basename 07/19/10 0435 07/18/10 0445  WBC 11.0* 11.2*  HGB 15.0 15.1  HCT 45.0 45.3  PLT 236 240  NA 137 138  K 3.8 3.7  CL 104 105  CO2 27 27  GLUCOSE 76 79  BUN 9 10  CREATININE 0.75 0.78  CALCIUM 8.8 8.6  LABA1C  -- --    BRAIN MRI: 1. Intense diffusion abnormality in a gyriform pattern with  cytotoxic edema affecting both the chronically  encephalomalacic right parietal lobe, and also adjacent previously  normal brain. No associated mass effect or acute hemorrhage.  Favor these findings reflect acute seizure activity (status  epilepticus). Acute-on-chronic ischemia / infarction in these  areas is not favored but cannot be excluded.  2. Intermittently motion degraded study despite repeated imaging  attempts, with other areas of the brain appearing stable since  2011.   Lipid Panel No results found for this basename: CHOL,TRIG,HDL,CHOLHDL,VLDL,LDLCALC in the last 72 hours  Studies/Results: No results found.  Medications: Current facility-administered medications:0.9 %  sodium chloride infusion, , Intravenous, Continuous, Fredirick Maudlin, Last Rate: 125 mL/hr at 07/20/10 1720;  aspirin tablet 325 mg, 325 mg, Oral, Daily, Beryle Beams, MD, 325 mg at 07/20/10 1033;  BIOTENE DRY MOUTH (BIOTENE) solution, , Mouth Rinse, BID, Fredirick Maudlin;  Chlorhexidine Gluconate Cloth 2 % PADS 6 each, 6 each, Topical, Daily, Fredirick Maudlin, 6 each at 07/20/10 0600 clonazePAM (KLONOPIN) tablet 0.5 mg, 0.5 mg, Oral, TID, Beryle Beams, MD, 0.5 mg at 07/20/10 1720;  divalproex (DEPAKOTE) EC tablet 1,000 mg, 1,000 mg, Oral, BID, Beryle Beams, MD, 1,000 mg at 07/20/10 1034;  enoxaparin (LOVENOX) injection 40 mg, 40 mg, Subcutaneous, Q24H, Fredirick Maudlin, 40 mg at 07/20/10 1719;  HYDROcodone-acetaminophen (NORCO) 5-325 MG per  tablet 1.5 tablet, 1.5 tablet, Oral, Q6H PRN, Judie Bonus Hammons, PHARMD levETIRAcetam (KEPPRA) 1,500 mg in sodium chloride 0.9 % 100 mL IVPB, 1,500 mg, Intravenous, TID, Beryle Beams, MD, 1,500 mg at 07/20/10 1600;  LORazepam (ATIVAN) injection 0.5 mg, 0.5 mg, Intravenous, Q8H PRN, Gebreselassie Nida, 0.5 mg at 07/19/10 0807;  magnesium hydroxide (MILK OF MAGNESIA) suspension 30 mL, 30  mL, Oral, Daily PRN, Fredirick Maudlin, 30 mL at 07/20/10 1033 midodrine (PROAMATINE) tablet 10 mg, 10 mg, Oral, TID WC, Fredirick Maudlin, 10 mg at 07/20/10 1720;  mupirocin (BACTROBAN) 2 % ointment 1 application, 1 application, Nasal, BID, Fredirick Maudlin, 1 application at 07/20/10 1041;  nitroGLYCERIN (NITROSTAT) SL tablet 0.4 mg, 0.4 mg, Sublingual, Q5 min PRN, Beryle Beams, MD;  pantoprazole (PROTONIX) EC tablet 40 mg, 40 mg, Oral, Daily, Beryle Beams, MD, 40 mg at 07/20/10 1034 phenelzine (NARDIL) tablet 30 mg, 30 mg, Oral, TID, Beryle Beams, MD, 30 mg at 07/20/10 1719;  simvastatin (ZOCOR) tablet 20 mg, 20 mg, Oral, Daily, Beryle Beams, MD, 20 mg at 07/20/10 1034;  zolpidem (AMBIEN) tablet 10 mg, 10 mg, Oral, QHS PRN, Beryle Beams, MD   Assessment/Plan: 1.  Resolved complex partial status epilepticus. He now may be having simple partial seizures. No generalized seizures are seen. I suspect we generalized convulsions were likely the cause of the patient's falling/gait impairment. Again, the substrate for the seizures are his old stroke. I will go ahead and switch the patient's Keppra to p.o. We'll continue with the Depakote at the current 1 g b.i.d. Klonopin will be continued. We'll check a valproic acid level. It appears that the patient likely needs to be placed in some couple of skilled facility once his care has been stabilized. DVT Prophylaxis.  LOS: 5 days   Elfida Shimada

## 2010-07-20 NOTE — Progress Notes (Signed)
Pt transferred to room 303 per MD order. Pt transferred via bed. Report given to RN.

## 2010-07-20 NOTE — Progress Notes (Signed)
Subjective: He has done well last 24 hrs. No more seizure activity.His MRI did not show a new stroke  Objective: Vital signs in last 24 hours: Temp:  [98 F (36.7 C)-98.9 F (37.2 C)] 98.2 F (36.8 C) (07/13 0400) Pulse Rate:  [43-66] 47  (07/13 0600) Resp:  [15-23] 18  (07/13 0600) BP: (118-186)/(61-87) 126/66 mmHg (07/13 0600) SpO2:  [88 %-97 %] 96 % (07/13 0600) Weight change:  Last BM Date: 07/14/10  Intake/Output from previous day: 07/12 0701 - 07/13 0700 In: 1490 [I.V.:1375; IV Piggyback:115] Out: 7625 [Urine:7625]  PHYSICAL EXAM General appearance: alert, distracted and moderately obese Resp: rhonchi bilaterally Cardio: regular rate and rhythm, S1, S2 normal, no murmur, click, rub or gallop GI: soft, non-tender; bowel sounds normal; no masses,  no organomegaly Extremities: extremities normal, atraumatic, no cyanosis or edema  Lab Results:  Basename 07/19/10 0435 07/18/10 0445  WBC 11.0* 11.2*  HGB 15.0 15.1  HCT 45.0 45.3  PLT 236 240   BMET  Basename 07/19/10 0435 07/18/10 0445  NA 137 138  K 3.8 3.7  CL 104 105  CO2 27 27  GLUCOSE 76 79  BUN 9 10  CREATININE 0.75 0.78  CALCIUM 8.8 8.6    Studies/Results: No results found.  Medications:  Scheduled:   . aspirin  325 mg Oral Daily  . antiseptic oral rinse   Mouth Rinse BID  . Chlorhexidine Gluconate Cloth  6 each Topical Daily  . clonazePAM  0.5 mg Oral TID  . divalproex  1,000 mg Oral BID  . enoxaparin (LOVENOX) injection  40 mg Subcutaneous Q24H  . levetiracetam  1,500 mg Intravenous TID  . midodrine  10 mg Oral TID WC  . mupirocin  1 application Nasal BID  . pantoprazole  40 mg Oral Daily  . phenelzine  30 mg Oral TID  . simvastatin  20 mg Oral Daily  . DISCONTD: divalproex  500 mg Oral BID    Assesment:He has improved Principal Problem:  *Seizure disorder Active Problems:  Orthostatic hypotension  HYPERLIPIDEMIA  ANXIETY DEPRESSION  HYPERTENSION  CAD  CORONARY ATHEROSCLEROSIS  NATIVE CORONARY ARTERY  STROKE  CEREBROVASCULAR DISEASE  GERD    Plan:since he is improved I will move from ICU    LOS: 5 days   Kordelia Severin L 07/20/2010, 9:32 AM

## 2010-07-21 MED ORDER — AMLODIPINE BESYLATE 5 MG PO TABS
5.0000 mg | ORAL_TABLET | Freq: Every day | ORAL | Status: DC
  Administered 2010-07-21 – 2010-07-27 (×7): 5 mg via ORAL
  Filled 2010-07-21 (×7): qty 1

## 2010-07-21 NOTE — Plan of Care (Signed)
Problem: Phase II Progression Outcomes Goal: Progress activity as tolerated unless otherwise ordered Outcome: Completed/Met Date Met:  07/21/10 Pt up to chair today with 3 person assist and use of stedy

## 2010-07-21 NOTE — Progress Notes (Signed)
Subjective: [name] has been transferred from the intensive care unit. He is admitted with seizures. This morning he is not as alert but once I went in and talked to him he did regain his typical level of alertness. He has not had anymore seizures. He has no other new complaints.  Objective: Vital signs in last 24 hours: Temp:  [98.1 F (36.7 C)-99.5 F (37.5 C)] 98.1 F (36.7 C) (07/14 1100) Pulse Rate:  [46-82] 62  (07/14 1100) Resp:  [18-20] 18  (07/14 1100) BP: (93-216)/(57-86) 118/67 mmHg (07/14 1100) SpO2:  [86 %-92 %] 91 % (07/14 1100) Weight change:  Last BM Date: 07/14/10  Intake/Output from previous day: 07/13 0701 - 07/14 0700 In: 2100 [P.O.:350; I.V.:1750] Out: 6450 [Urine:6450]  PHYSICAL EXAM General appearance: fatigued and moderately obese Resp: rhonchi bilaterally Cardio: regular rate and rhythm, S1, S2 normal, no murmur, click, rub or gallop GI: soft, non-tender; bowel sounds normal; no masses,  no organomegaly Extremities: extremities normal, atraumatic, no cyanosis or edema  Lab Results:  [facility] 07/19/10 0435  WBC 11.0*  HGB 15.0  HCT 45.0  PLT 236   BMET  Basename 07/19/10 0435  NA 137  K 3.8  CL 104  CO2 27  GLUCOSE 76  BUN 9  CREATININE 0.75  CALCIUM 8.8    Studies/Results: No results found.  Medications:  Scheduled:   . aspirin  325 mg Oral Daily  . antiseptic oral rinse   Mouth Rinse BID  . Chlorhexidine Gluconate Cloth  6 each Topical Daily  . clonazePAM  0.5 mg Oral TID  . cloNIDine  0.1 mg Oral QHS  . divalproex  1,000 mg Oral BID  . enoxaparin (LOVENOX) injection  40 mg Subcutaneous Q24H  . levETIRAcetam  1,500 mg Oral TID  . midodrine  10 mg Oral TID WC  . mupirocin  1 application Nasal BID  . pantoprazole  40 mg Oral Daily  . phenelzine  30 mg Oral TID  . simvastatin  20 mg Oral Daily  . DISCONTD: levetiracetam  1,500 mg Intravenous TID    Assesment: He has improved as far as his seizures are concerned. His  blood pressure is more elevated today so I'm going to need to add more medication for his blood pressure. As far as his COPD is concerned he is stable. He seems to be doing well as far as his previous stroke and did not have any evidence of new stroke on MRI Principal Problem:  *Seizure disorder Active Problems:  Orthostatic hypotension  HYPERLIPIDEMIA  ANXIETY DEPRESSION  HYPERTENSION  CAD  CORONARY ATHEROSCLEROSIS NATIVE CORONARY ARTERY  STROKE  CEREBROVASCULAR DISEASE  GERD    Plan: I will increase his medication for blood pressure continue with everything else he is not ready for discharge yet the morning try to get him up in and out of the bed up more today.    LOS: 6 days   Jamison Yuhasz L 07/21/2010, 11:11 AM

## 2010-07-21 NOTE — Progress Notes (Signed)
07/21/2010 0607 REPORTED SIGNIFICANTLY LOWER BP TO DR. FAGAN. NO CHANGE IN NEURO CHECK. PUPILS BRISK ROUND. LEFT-SIDED WEAKNESS TO UPPER AND LOWER EXTREMITY. NO CHANGE FROM LAST NIGHT. PATIENT AROUSEABLE,  BUT LETHARGIC (NO CHANGE FROM LAST NIGHT) VSS. NO FURTHER ORDERS GIVEN AT THIS TIME. Farhad Burleson BChristell Constant

## 2010-07-21 NOTE — Progress Notes (Signed)
Pt up to chair today with 2-3 person assist and use of the stedy lift.  Pt is able to stand up with assist, but cannot maintain standing position without max assist.  Pt is still lethargic, but has improved somewhat as the day goes by.  Pt is opening eyes more and is moving R side extremities some independently.    Pts O2 dropped to 88%. Pt had "slimped" down in chair.  Pt transferred back to bed and sat up straight with O2 at 5L - O2 recheck - 93%

## 2010-07-22 LAB — URINALYSIS, ROUTINE W REFLEX MICROSCOPIC
Glucose, UA: NEGATIVE mg/dL
Ketones, ur: NEGATIVE mg/dL
Nitrite: NEGATIVE
Protein, ur: NEGATIVE mg/dL
pH: 7.5 (ref 5.0–8.0)

## 2010-07-22 LAB — URINE MICROSCOPIC-ADD ON

## 2010-07-22 NOTE — Progress Notes (Signed)
Subjective: He continurs to be sluggish and I think this is due to seizures and medications.No other new complaints.  Objective: Vital signs in last 24 hours: Temp:  [97.4 F (36.3 C)-97.8 F (36.6 C)] 97.5 F (36.4 C) (07/15 1745) Pulse Rate:  [51-73] 57  (07/15 1745) Resp:  [17-19] 18  (07/15 1745) BP: (118-151)/(58-78) 123/78 mmHg (07/15 1745) SpO2:  [89 %-96 %] 93 % (07/15 1745) Weight change:  Last BM Date: 07/14/10  Intake/Output from previous day: 07/14 0701 - 07/15 0700 In: 2440 [P.O.:1000; I.V.:1440] Out: 1200 [Urine:1200]  PHYSICAL EXAM General appearance: distracted and no distress Resp: rhonchi bilaterally Cardio: regular rate and rhythm, S1, S2 normal, no murmur, click, rub or gallop GI: soft, non-tender; bowel sounds normal; no masses,  no organomegaly Extremities: extremities normal, atraumatic, no cyanosis or edema  Lab Results: No results found for this basename: WBC:2,HGB:2,HCT:2,PLT:2 in the last 72 hours BMET No results found for this basename: NA:2,K:2,CL:2,CO2:2,GLUCOSE:2,BUN:2,CREATININE:2,CALCIUM:2 in the last 72 hours  Studies/Results: No results found.  Medications:  Scheduled:   . amLODipine  5 mg Oral Daily  . aspirin  325 mg Oral Daily  . antiseptic oral rinse   Mouth Rinse BID  . clonazePAM  0.5 mg Oral TID  . divalproex  1,000 mg Oral BID  . enoxaparin (LOVENOX) injection  40 mg Subcutaneous Q24H  . levETIRAcetam  1,500 mg Oral TID  . midodrine  10 mg Oral TID WC  . mupirocin  1 application Nasal BID  . pantoprazole  40 mg Oral Daily  . phenelzine  30 mg Oral TID  . simvastatin  20 mg Oral Daily  . DISCONTD: cloNIDine  0.1 mg Oral QHS    Assesment:He has resolved seizures. He sis sluggish. No other new problems. Principal Problem:  *Seizure disorder Active Problems:  Orthostatic hypotension  HYPERLIPIDEMIA  ANXIETY DEPRESSION  HYPERTENSION  CAD  CORONARY ATHEROSCLEROSIS NATIVE CORONARY ARTERY  STROKE  CEREBROVASCULAR  DISEASE  GERD    Plan:He has improved but still with sluggishness. He may be able to be discharged fairlyt soon.    LOS: 7 days   Dalia Jollie L 07/22/2010, 8:16 PM

## 2010-07-23 LAB — VALPROIC ACID LEVEL: Valproic Acid Lvl: 98.1 ug/mL (ref 50.0–100.0)

## 2010-07-23 LAB — DIFFERENTIAL
Basophils Absolute: 0 10*3/uL (ref 0.0–0.1)
Basophils Relative: 0 % (ref 0–1)
Eosinophils Absolute: 0.5 10*3/uL (ref 0.0–0.7)
Monocytes Absolute: 0.8 10*3/uL (ref 0.1–1.0)
Monocytes Relative: 7 % (ref 3–12)

## 2010-07-23 LAB — BASIC METABOLIC PANEL
BUN: 5 mg/dL — ABNORMAL LOW (ref 6–23)
Calcium: 8.6 mg/dL (ref 8.4–10.5)
Creatinine, Ser: 0.56 mg/dL (ref 0.50–1.35)
GFR calc Af Amer: >60 mL/min (ref >60)
GFR calc non Af Amer: >60 mL/min (ref >60)

## 2010-07-23 LAB — CBC
HCT: 47.7 % (ref 39.0–52.0)
Hemoglobin: 15.6 g/dL (ref 13.0–17.0)
MCH: 31.8 pg (ref 26.0–34.0)
MCHC: 32.7 g/dL (ref 30.0–36.0)
RDW: 14.3 % (ref 11.5–15.5)

## 2010-07-23 MED ORDER — DIVALPROEX SODIUM 250 MG PO DR TAB: 1000.0000 mg | DELAYED_RELEASE_TABLET | Freq: Once | ORAL | Status: DC

## 2010-07-23 MED ORDER — SODIUM CHLORIDE 0.9 % IJ SOLN
INTRAMUSCULAR | Status: AC
  Administered 2010-07-23: 05:00:00
  Filled 2010-07-23: qty 10

## 2010-07-23 MED ORDER — DIVALPROEX SODIUM 250 MG PO DR TAB: 500.0000 mg | DELAYED_RELEASE_TABLET | Freq: Two times a day (BID) | ORAL | Status: DC

## 2010-07-23 MED ORDER — DIVALPROEX SODIUM 250 MG PO DR TAB
1000.0000 mg | DELAYED_RELEASE_TABLET | Freq: Two times a day (BID) | ORAL | Status: DC
  Administered 2010-07-24 – 2010-07-25 (×3): 1000 mg via ORAL
  Filled 2010-07-23 (×4): qty 4

## 2010-07-23 NOTE — Progress Notes (Signed)
Subjective: The patient was noted to be quite dry today by Dr. Juanetta Gosling and the nurses. His medications have not been changed. No seizures are reported at least recently     Objective: Vital signs in last 24 hours: Temp:  [97.4 F (36.3 C)-97.8 F (36.6 C)] 97.7 F (36.5 C) (07/16 0420) Pulse Rate:  [51-58] 53  (07/16 0420) Resp:  [18] 18  (07/16 0420) BP: (120-151)/(75-78) 120/76 mmHg (07/16 0420) SpO2:  [93 %-96 %] 94 % (07/16 0420)  Intake/Output from previous day: 07/15 0701 - 07/16 0700 In: 3469.2 [P.O.:440; I.V.:3029.2] Out: 2550 [Urine:2550] Intake/Output this shift:   Nutritional status: General  PE:   HEENT: Normal  ABDOMEN: soft  EXTREMITIES: No edema   SKIN: Normal by inspection.    MENTAL STATUS:  The patient is dosing much more than he has the last 2 days. He dermatomes his eyes to deep painful stimuli but he does reports that he's doing okay. He does follow commands briefly on the right side   CRANIAL NERVES: Pupils are equal, round and reactive to light; extra ocular movements are full, there is no significant nystagmus; again dense left field cut upper and lower facial muscles are normal in strength and symmetric, there is no flattening of the nasolabial folds.  MOTOR: Right side shows normal tone, bulk and strength. He has antigravity strength in the left side with increased tone especially involving the left upper extremity.     Lab Results:  [facility] 07/23/10 0445  WBC 11.8*  HGB 15.6  HCT 47.7  PLT 207  NA 139  K 3.8  CL 106  CO2 27  GLUCOSE 72  BUN 5*  CREATININE 0.56  CALCIUM 8.6  LABA1C --       Lipid Panel No results found for this basename: CHOL,TRIG,HDL,CHOLHDL,VLDL,LDLCALC in the last 72 hours  Studies/Results: No results found.  Medications: Current facility-administered medications:0.9 %  sodium chloride infusion, , Intravenous, Continuous, Fredirick Maudlin, Last Rate: 125 mL/hr at 07/23/10 1610;  amLODipine  (NORVASC) tablet 5 mg, 5 mg, Oral, Daily, Fredirick Maudlin, 5 mg at 07/23/10 9604;  aspirin tablet 325 mg, 325 mg, Oral, Daily, Beryle Beams, MD, 325 mg at 07/23/10 0915;  BIOTENE DRY MOUTH (BIOTENE) solution, , Mouth Rinse, BID, Fredirick Maudlin clonazePAM [facility]) tablet 0.5 mg, 0.5 mg, Oral, TID, Beryle Beams, MD, 0.5 mg at 07/23/10 0915;  divalproex (DEPAKOTE) EC tablet 500 mg, 500 mg, Oral, BID, Fredirick Maudlin;  enoxaparin (LOVENOX) injection 40 mg, 40 mg, Subcutaneous, Q24H, Fredirick Maudlin, 40 mg at 07/22/10 1739;  HYDROcodone-acetaminophen (NORCO) 5-325 MG per tablet 1.5 tablet, 1.5 tablet, Oral, Q6H PRN, Judie Bonus Hammons, PHARMD levETIRAcetam (KEPPRA) tablet 1,500 mg, 1,500 mg, Oral, TID, Beryle Beams, MD, 1,500 mg at 07/23/10 0916;  LORazepam (ATIVAN) injection 0.5 mg, 0.5 mg, Intravenous, Q8H PRN, Gebreselassie Nida, 0.5 mg at 07/19/10 0807;  magnesium hydroxide (MILK OF MAGNESIA) suspension 30 mL, 30 mL, Oral, Daily PRN, Fredirick Maudlin, 30 mL at 07/22/10 1754;  midodrine (PROAMATINE) tablet 10 mg, 10 mg, Oral, TID WC, Fredirick Maudlin, 10 mg at 07/23/10 0916 mupirocin (BACTROBAN) 2 % ointment 1 application, 1 application, Nasal, BID, Fredirick Maudlin, 1 application at 07/22/10 1014;  nitroGLYCERIN (NITROSTAT) SL tablet 0.4 mg, 0.4 mg, Sublingual, Q5 min PRN, Beryle Beams, MD;  pantoprazole (PROTONIX) EC tablet 40 mg, 40 mg, Oral, Daily, Beryle Beams, MD, 40 mg at 07/23/10 0915;  phenelzine (NARDIL) tablet 30 mg, 30 mg, Oral, TID, Acea Yagi  Nohelani Benning, MD, 30 mg at 07/23/10 0917 simvastatin (ZOCOR) tablet 20 mg, 20 mg, Oral, Daily, Beryle Beams, MD, 20 mg at 07/23/10 0915;  sodium chloride 0.9 % injection, , , , ;  sodium chloride 0.9 % injection, , , , ;  zolpidem (AMBIEN) tablet 10 mg, 10 mg, Oral, QHS PRN, Beryle Beams, MD;  DISCONTD: divalproex (DEPAKOTE) EC tablet 1,000 mg, 1,000 mg, Oral, BID, Beryle Beams, MD, 1,000 mg at 07/22/10 2144   Assessment/Plan: 1.  Encephalopathy  likely mostly due to medication effect. Valproic acid level is 98 within normal range although on the higher end of the range. I believe the clonazepam is most likely to be the main culprit causing the drowsiness although certainly muscle medications are undoubtedly the neural. I will therefore discontinue the clonazepam for now and continue the Depakote at current levels. The Keppra will also be continued at a trilevel. We'll check a ammonia level tomorrow and repeat his neurological evaluation. 2. Resolved complex partial status epilepticus.   LOS: 8 days   Deirdra Heumann

## 2010-07-23 NOTE — Progress Notes (Signed)
Subjective: He is less responsive. He has not had any seizures. He has no new complaints.  Objective: Vital signs in last 24 hours: Temp:  [97.4 F (36.3 C)-97.8 F (36.6 C)] 97.7 F (36.5 C) (07/16 0420) Pulse Rate:  [51-58] 53  (07/16 0420) Resp:  [18] 18  (07/16 0420) BP: (120-151)/(75-78) 120/76 mmHg (07/16 0420) SpO2:  [93 %-96 %] 94 % (07/16 0420) Weight change:  Last BM Date: 07/22/10  Intake/Output from previous day: 07/15 0701 - 07/16 0700 In: 3469.2 [P.O.:440; I.V.:3029.2] Out: 2550 [Urine:2550]  PHYSICAL EXAM General appearance: distracted, no distress and moderately obese Resp: clear to auscultation bilaterally Cardio: regular rate and rhythm, S1, S2 normal, no murmur, click, rub or gallop GI: soft, non-tender; bowel sounds normal; no masses,  no organomegaly Extremities: extremities normal, atraumatic, no cyanosis or edema  Lab Results:  Basename 07/23/10 0445  WBC 11.8*  HGB 15.6  HCT 47.7  PLT 207   BMET  Basename 07/23/10 0445  NA 139  K 3.8  CL 106  CO2 27  GLUCOSE 72  BUN 5*  CREATININE 0.56  CALCIUM 8.6    Studies/Results: No results found.  Medications:  Prior to Admission:  Prescriptions prior to admission  Medication Sig Dispense Refill  . atorvastatin (LIPITOR) 40 MG tablet Take 40 mg by mouth daily.        . Eszopiclone (ESZOPICLONE) 3 MG TABS Take 3 mg by mouth at bedtime as needed. Take immediately before bedtime, for sleep       . HYDROcodone-acetaminophen (NORCO) 7.5-325 MG per tablet Take 1 tablet by mouth every 6 (six) hours as needed. pain       . nitroGLYCERIN (NITROSTAT) 0.4 MG SL tablet Place 0.4 mg under the tongue every 5 (five) minutes as needed. Chest pain      . pantoprazole (PROTONIX) 40 MG tablet Take 40 mg by mouth daily.        . phenelzine (NARDIL) 15 MG tablet Take 30 mg by mouth 3 (three) times daily.         Scheduled:   . amLODipine  5 mg Oral Daily  . aspirin  325 mg Oral Daily  . antiseptic oral rinse    Mouth Rinse BID  . clonazePAM  0.5 mg Oral TID  . divalproex  500 mg Oral BID  . enoxaparin (LOVENOX) injection  40 mg Subcutaneous Q24H  . levETIRAcetam  1,500 mg Oral TID  . midodrine  10 mg Oral TID WC  . mupirocin  1 application Nasal BID  . pantoprazole  40 mg Oral Daily  . phenelzine  30 mg Oral TID  . simvastatin  20 mg Oral Daily  . sodium chloride      . sodium chloride      . DISCONTD: divalproex  1,000 mg Oral BID   Continuous:   . sodium chloride 125 mL/hr at 07/23/10 3086   VHQ:[id number], LORazepam, magnesium hydroxide, nitroGLYCERIN, zolpidem  Assesment: His Depakote level is approaching the upper limit of normal. I think that is probably causing his sleepiness. He has not had any more seizures so I think that's excellent. He has been hypertensive but with his known severe orthostatic hypotension I do not want to change his blood pressure medications. Principal Problem:  *Seizure disorder Active Problems:  Orthostatic hypotension  HYPERLIPIDEMIA  ANXIETY DEPRESSION  HYPERTENSION  CAD  CORONARY ATHEROSCLEROSIS NATIVE CORONARY ARTERY  STROKE  CEREBROVASCULAR DISEASE  GERD    Plan: I will hold his Depakote today  and start on a lower dose tomorrow I will recheck a level in the morning as well. He will continue his other treatments he may need to have a evaluation for rehabilitation placement.    LOS: 8 days   Jerrold Haskell L 07/23/2010, 8:40 AM

## 2010-07-24 LAB — URINE CULTURE: Colony Count: >=100000

## 2010-07-24 LAB — AMMONIA: Ammonia: 67 umol/L — ABNORMAL HIGH (ref 11–60)

## 2010-07-24 MED ORDER — SODIUM CHLORIDE 0.9 % IJ SOLN
INTRAMUSCULAR | Status: AC
  Administered 2010-07-24: 10 mL via INTRAVENOUS
  Filled 2010-07-24: qty 10

## 2010-07-24 NOTE — Plan of Care (Signed)
Problem: Phase II Progression Outcomes Goal: Other Phase II Outcomes/Goals Outcome: Progressing Pt to treat

## 2010-07-24 NOTE — Progress Notes (Signed)
Subjective: He continues to be sleepy. We've cut back on his medications. I think the problem is t he had such evere problems with seizures we had to give him a large number of medications to stopthe seizures I  and now he is sedated from those medications. I discussed his situation with Dr. Gerilyn Pilgrim and we both feel that we need to cut back on his medication.he has not had any more seizures. He is very weak. His other problems appear to be stable  Objective: Vital signs in last 24 hours: Temp:  [97.8 F (36.6 C)-98.7 F (37.1 C)] 98.7 F (37.1 C) (07/17 1400) Pulse Rate:  [51-74] 72  (07/17 1400) Resp:  [18-24] 20  (07/17 1400) BP: (117-134)/(65-77) 134/67 mmHg (07/17 1400) SpO2:  [92 %-98 %] 97 % (07/17 1400) Weight change:  Last BM Date: 07/22/10  Intake/Output from previous day: 07/16 0701 - 07/17 0700 In: 2004.6 [P.O.:240; I.V.:1764.6] Out: -   PHYSICAL EXAM General appearance: distracted, no distress and moderately obese Resp: rhonchi bilaterally Cardio: regular rate and rhythm, S1, S2 normal, no murmur, click, rub or gallop GI: soft, non-tender; bowel sounds normal; no masses,  no organomegaly Extremities: extremities normal, atraumatic, no cyanosis or edema  Lab Results:  Basename 07/23/10 0445  WBC 11.8*  HGB 15.6  HCT 47.7  PLT 207   BMET  Basename 07/23/10 0445  NA 139  K 3.8  CL 106  CO2 27  GLUCOSE 72  BUN 5*  CREATININE 0.56  CALCIUM 8.6    Studies/Results: No results found.  Medications:  Prior to Admission:  Prescriptions prior to admission  Medication Sig Dispense Refill  . atorvastatin (LIPITOR) 40 MG tablet Take 40 mg by mouth daily.        . Eszopiclone (ESZOPICLONE) 3 MG TABS Take 3 mg by mouth at bedtime as needed. Take immediately before bedtime, for sleep       . HYDROcodone-acetaminophen (NORCO) 7.5-325 MG per tablet Take 1 tablet by mouth every 6 (six) hours as needed. pain       . nitroGLYCERIN (NITROSTAT) 0.4 MG SL tablet Place  0.4 mg under the tongue every 5 (five) minutes as needed. Chest pain      . pantoprazole (PROTONIX) 40 MG tablet Take 40 mg by mouth daily.        . phenelzine (NARDIL) 15 MG tablet Take 30 mg by mouth 3 (three) times daily.         Scheduled:   . amLODipine  5 mg Oral Daily  . aspirin  325 mg Oral Daily  . antiseptic oral rinse   Mouth Rinse BID  . divalproex  1,000 mg Oral BID  . enoxaparin (LOVENOX) injection  40 mg Subcutaneous Q24H  . levETIRAcetam  1,500 mg Oral TID  . midodrine  10 mg Oral TID WC  . pantoprazole  40 mg Oral Daily  . phenelzine  30 mg Oral TID  . simvastatin  20 mg Oral Daily  . DISCONTD: divalproex  1,000 mg Oral Once   Continuous:   . sodium chloride 125 mL/hr at 07/24/10 1155   JXB:[id number], LORazepam, magnesium hydroxide, nitroGLYCERIN, zolpidem  Assesment:he has had a stroke. This is rather remote. He has seizures I think are related to that stroke. He has COPD which is unchanged. He is somnolent from the seizures and the medications for that. He is very deconditioned. He has orthostatic hypotension. He has systemic hypertension. That seems to be stable. Principal Problem:  *Seizure disorder Active  Problems:  Orthostatic hypotension  HYPERLIPIDEMIA  ANXIETY DEPRESSION  HYPERTENSION  CAD  CORONARY ATHEROSCLEROSIS NATIVE CORONARY ARTERY  STROKE  CEREBROVASCULAR DISEASE  GERD    Plan:Re: cut back on his medications more continue his other treatments and try to get him set to go to a skilled care facility for rehabilitation fairly soon but he needs to be more awake than he is now.    LOS: 9 days   Kim Oki L 07/24/2010, 7:25 PM

## 2010-07-24 NOTE — Consult Note (Signed)
[name] presented bed offers to pt's daughter as pt not alert. She chooses [facility].  Facility notified. [name] also informed [name] at facility that pt has 30 day [facility].  Awaiting stability for d/c.   Jeffrey Sweeney

## 2010-07-24 NOTE — Progress Notes (Signed)
Physical Therapy Evaluation Patient Name: Jeffrey Sweeney ZOXWR'U Date: 07/24/2010 Problem List:  Patient Active Problem List  Diagnoses  . HYPERLIPIDEMIA  . SPLENIC INFARCTION  . ANXIETY DEPRESSION  . HYPERTENSION  . CAD  . CORONARY ATHEROSCLEROSIS NATIVE CORONARY ARTERY  . STROKE  . CEREBROVASCULAR DISEASE  . EMBOLISM&THROMBOSIS OF OTHER SPECIFIED ARTERY  . Orthostatic hypotension  . GERD  . LOW BACK PAIN, MILD  . SYNCOPE  . EARLY SATIETY  . WEIGHT LOSS, ABNORMAL  . CHEST PAIN  . DIARRHEA  . LUQ PAIN  . BIPOLAR AFFECTIVE DISORDER, HX OF  . PERSONAL HISTORY OF COLONIC POLYPS  . Seizure disorder   Past Medical History:  Past Medical History  Diagnosis Date  . Heart disease   . CAD (coronary artery disease)   . Stroke     left arm weakness   Past Surgical History: History reviewed. No pertinent past surgical history.  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, Civil Service Overall Cognitive Status: Impaired Orientation Level:  (pt, too lethargic yo assess) Cognition - Other Comments:  (one word responses seem appropriate) Sensation/Coordination Sensation Light Touch: Not tested Stereognosis: Not tested Hot/Cold: Not tested Proprioception: Not tested Additional Comments:  (LUE dysfunction  due to old CVA) Coordination Gross Motor Movements are Fluid and Coordinated: No Fine Motor Movements are  Fluid and Coordinated: Not tested Coordination and Movement Description:  (strong flexor tone Left side) Extremity Assessment RUE Assessment RUE Assessment:  (moves arm spontaneously) LUE Assessment LUE Assessment: Exceptions to WFL LUE Tone LUE Tone: Severe RLE Assessment RLE Assessment: Within Functional Limits LLE Assessment LLE Assessment: Exceptions to WFL LLE Tone LLE Tone Comments: moderate ton in leg and trunk Mobility (including Balance) Bed Mobility Bed Mobility: Yes Rolling Right: 1: +2 Total assist Rolling Left: 1: +2 Total assist Right Sidelying to Sit: 1: +1 Total assist Supine to Sit: 1: +1 Total assist Supine to Sit Details (indicate cue type and reason): unable to follow any directions Sit to Supine - Right: 1: +2 Total assist Transfers Transfers: Yes Sit to Stand:  (unable to sit, much less stand) Transfer via Lift Equipment: Maximove Ambulation/Gait Ambulation/Gait: No Ambulation/Gait Assistance:  (significant decline in status since initial eval) Ambulation Distance (Feet): 0 Feet Assistive device: Other (Comment) (n/a) Gait Pattern: Within Functional Limits Gait velocity: n/a Stairs: No Wheelchair Mobility Wheelchair Mobility: No  Posture/Postural Control Posture/Postural Control: No significant limitations Balance Balance Assessed: Yes Static Sitting Balance Static Sitting - Balance Support: Feet supported;Bilateral upper extremity supported Static Sitting - Level of Assistance: 1: +1 Total assist Static Sitting - Comment/# of Minutes: sat at EOB for 8 min. Dynamic Sitting Balance Dynamic Sitting - Balance Support:  (total assist to sit) Dynamic Sitting - Level of Assistance: 1: +1 Total assist Static Standing Balance Static Standing - Balance Support:  (unable to stand) Static Standing - Level of Assistance:  (unable) Exercise     End of Session PT - End of Session Equipment Utilized During Treatment: Other (comment) (maxi  move) Activity Tolerance: Patient tolerated treatment well Patient  left: in chair;with call bell in reach Nurse Communication: Need for lift equipment;Mobility status for transfers General Behavior During Session: Lethargic Cognition: Impaired PT Assessment/Plan/Recommendation PT Assessment Clinical Impression Statement: severe physical, cognitive deficit PT Recommendation/Assessment: Patient will need skilled PT in the acute care venue PT Problem List: Decreased strength;Decreased activity tolerance;Decreased balance;Decreased mobility;Decreased coordination;Decreased safety awareness;Decreased knowledge of precautions;Decreased cognition;Decreased knowledge of use of DME Problem List Comments: orthostatic hypotension no longer a concern Barriers to Discharge: None PT Therapy Diagnosis :  (total assist for all mobility) PT Plan PT Frequency: Min 3X/week PT Treatment/Interventions: Neuromuscular re-education;Balance training PT Recommendation Recommendations for Other Services: OT consult;Speech consult Follow Up Recommendations: Skilled nursing facility Equipment Recommended: Defer to next venue PT Goals  Acute Rehab PT Goals PT Goal Formulation: Patient unable to participate in goal setting Time For Goal Achievement: 2 weeks Pt will Roll Supine to Left Side: with max assist Pt will go Supine/Side to Sit: with max assist Pt will Sit at [facility] of Bed: with max assist Pt will Ambulate: Independently PT Goal: Ambulate - Progress: Discontinued (comment) (change in medical status-now unable to ambulate)  [name] 07/24/2010, 5:18 PM

## 2010-07-24 NOTE — Progress Notes (Signed)
Subjective: The patient is still sleepy today. The nurse reported he has remained sleepy throughout a 24-hour since he was last seen. No seizures are reported at least recently     Objective: Vital signs in last 24 hours: Temp:  [97.4 F (36.3 C)-98.4 F (36.9 C)] 97.8 F (36.6 C) (07/17 0619) Pulse Rate:  [50-74] 74  (07/17 0619) Resp:  [16-24] 24  (07/17 0619) BP: (112-148)/(67-79) 117/77 mmHg (07/17 0619) SpO2:  [92 %-98 %] 95 % (07/17 0619)  Intake/Output from previous day: 07/16 0701 - 07/17 0700 In: 2004.6 [P.O.:240; I.V.:1764.6] Out: -  Intake/Output this shift: I/O this shift: In: 500 [I.V.:500] Out: -  Nutritional status: General  PE:   HEENT: Normal  ABDOMEN: soft  EXTREMITIES: No edema   SKIN: Normal by inspection.    MENTAL STATUS:  The patient is dosing much more than he has the last 2 days. He dermatomes his eyes to deep painful stimuli but he does reports that he's doing okay. He does follow commands briefly on the right side   CRANIAL NERVES: Pupils are equal, round and reactive to light; extra ocular movements are full, there is no significant nystagmus; again dense left field cut upper and lower facial muscles are normal in strength and symmetric, there is no flattening of the nasolabial folds.  MOTOR: Right side shows normal tone, bulk and strength. He has antigravity strength in the left side with increased tone especially involving the left upper extremity.     Lab Results:  [facility] 07/23/10 0445  WBC 11.8*  HGB 15.6  HCT 47.7  PLT 207  NA 139  K 3.8  CL 106  CO2 27  GLUCOSE 72  BUN 5*  CREATININE 0.56  CALCIUM 8.6  LABA1C --   Ammonia 67; valproic acid 46    Lipid Panel No results found for this basename: CHOL,TRIG,HDL,CHOLHDL,VLDL,LDLCALC in the last 72 hours  Studies/Results: No results found.  Medications: Current facility-administered medications:0.9 %  sodium chloride infusion, , Intravenous, Continuous, Fredirick Maudlin, Last Rate: 125 mL/hr at 07/23/10 2300;  amLODipine (NORVASC) tablet 5 mg, 5 mg, Oral, Daily, Fredirick Maudlin, 5 mg at 07/23/10 7829;  aspirin tablet 325 mg, 325 mg, Oral, Daily, Beryle Beams, MD, 325 mg at 07/23/10 0915;  BIOTENE DRY MOUTH (BIOTENE) solution, , Mouth Rinse, BID, Fredirick Maudlin divalproex (DEPAKOTE) EC tablet 1,000 mg, 1,000 mg, Oral, BID, Beryle Beams, MD;  divalproex (DEPAKOTE) EC tablet 1,000 mg, 1,000 mg, Oral, Once, Fredirick Maudlin;  enoxaparin (LOVENOX) injection 40 mg, 40 mg, Subcutaneous, Q24H, Fredirick Maudlin, 40 mg at 07/23/10 1736;  HYDROcodone-acetaminophen (NORCO) 5-325 MG per tablet 1.5 tablet, 1.5 tablet, Oral, Q6H PRN, Judie Bonus Hammons, PHARMD levETIRAcetam (KEPPRA) tablet 1,500 mg, 1,500 mg, Oral, TID, Beryle Beams, MD, 1,500 mg at 07/23/10 1658;  LORazepam (ATIVAN) injection 0.5 mg, 0.5 mg, Intravenous, Q8H PRN, Gebreselassie Nida, 0.5 mg at 07/19/10 0807;  magnesium hydroxide (MILK OF MAGNESIA) suspension 30 mL, 30 mL, Oral, Daily PRN, Fredirick Maudlin, 30 mL at 07/22/10 1754;  midodrine (PROAMATINE) tablet 10 mg, 10 mg, Oral, TID WC, Fredirick Maudlin, 10 mg at 07/23/10 1658 nitroGLYCERIN (NITROSTAT) SL tablet 0.4 mg, 0.4 mg, Sublingual, Q5 min PRN, Beryle Beams, MD;  pantoprazole (PROTONIX) EC tablet 40 mg, 40 mg, Oral, Daily, Beryle Beams, MD, 40 mg at 07/23/10 0915;  phenelzine (NARDIL) tablet 30 mg, 30 mg, Oral, TID, Beryle Beams, MD, 30 mg at 07/23/10 1658;  simvastatin (ZOCOR) tablet 20 mg,  20 mg, Oral, Daily, Beryle Beams, MD, 20 mg at 07/23/10 0915 zolpidem (AMBIEN) tablet 10 mg, 10 mg, Oral, QHS PRN, Beryle Beams, MD;  DISCONTD: clonazePAM (KLONOPIN) tablet 0.5 mg, 0.5 mg, Oral, TID, Beryle Beams, MD, 0.5 mg at 07/23/10 0915;  DISCONTD: divalproex (DEPAKOTE) EC tablet 1,000 mg, 1,000 mg, Oral, BID, Beryle Beams, MD, 1,000 mg at 07/22/10 2144;  DISCONTD: divalproex (DEPAKOTE) EC tablet 500 mg, 500 mg, Oral, BID, Fredirick Maudlin   Assessment/Plan: 1.  Likely metabolic encephalopathy due to medication effect. The elevated ammonia levels to see possibility of Depakote to induce hyperammonemia syndrome. We'll therefore discontinue the Depakote. He is on Keppra this will be continued. Again, the clonazepam has been discontinued. We'll continue to follow the patient's examination. 2. Resolved complex partial status epilepticus.   LOS: 9 days   Catina Nuss

## 2010-07-25 MED ORDER — SODIUM CHLORIDE 0.9 % IJ SOLN
INTRAMUSCULAR | Status: AC
  Administered 2010-07-25: 18:00:00 via INTRAVENOUS
  Filled 2010-07-25: qty 10

## 2010-07-25 NOTE — Progress Notes (Signed)
Subjective: Jeffrey Sweeney was admitted with seizure disorder. He has had a stroke in the past and is felt that the seizures are related to that stroke. He was very difficult to control and had in essence status epilepticus although it was not grand mal seizures. After he was given a number of medications his seizures have stopped these become very sleepy and I think is because he had been on so many seizure medications. I have been consulted with Dr.[place] on this and Dr. Marguarite Arbour and I have both decreased and discontinued a number of medications. He is still pretty sleepy now however. He is better than yesterday however.  Objective: Vital signs in last 24 hours: Temp:  [97.7 F (36.5 C)-98.7 F (37.1 C)] 97.7 F (36.5 C) (07/18 0500) Pulse Rate:  [56-72] 56  (07/18 0500) Resp:  [18-20] 20  (07/18 0500) BP: (119-134)/(65-77) 132/77 mmHg (07/18 0500) SpO2:  [93 %-98 %] 97 % (07/18 0500) Weight change:  Last BM Date: 07/22/10  Intake/Output from previous day: 07/17 0701 - 07/18 0700 In: 3092.1 [P.O.:240; I.V.:2852.1] Out: -   PHYSICAL EXAM General appearance: no distress, slowed mentation and Sleepy Resp: rhonchi bilaterally Cardio: regular rate and rhythm, S1, S2 normal, no murmur, click, rub or gallop GI: soft, non-tender; bowel sounds normal; no masses,  no organomegaly Extremities: extremities normal, atraumatic, no cyanosis or edema  Lab Results:  Basename 07/23/10 0445  WBC 11.8*  HGB 15.6  HCT 47.7  PLT 207   BMET  Basename 07/23/10 0445  NA 139  K 3.8  CL 106  CO2 27  GLUCOSE 72  BUN 5*  CREATININE 0.56  CALCIUM 8.6    Studies/Results: No results found.  Medications:  Scheduled:   . amLODipine  5 mg Oral Daily  . aspirin  325 mg Oral Daily  . antiseptic oral rinse   Mouth Rinse BID  . divalproex  1,000 mg Oral BID  . enoxaparin (LOVENOX) injection  40 mg Subcutaneous Q24H  . levETIRAcetam  1,500 mg Oral TID  . midodrine  10 mg Oral TID WC  .  pantoprazole  40 mg Oral Daily  . phenelzine  30 mg Oral TID  . simvastatin  20 mg Oral Daily  . sodium chloride       Continuous:   . sodium chloride 125 mL/hr at 07/25/10 1610   RUE:[id number], LORazepam, magnesium hydroxide, nitroGLYCERIN, zolpidem  Assesment: He is better as far as his seizures are concerned these having trouble with being sleepy and confused from medications. His medications have been decreased over the last 48 hours and he is better but remains somewhat drowsy. He also has pretty severe orthostatic hypotension and although he's been somewhat hypertensive when he is lying flat I have not changed his medications because he becomes very hypotensive when he stands. Principal Problem:  *Seizure disorder Active Problems:  Orthostatic hypotension  HYPERLIPIDEMIA  ANXIETY DEPRESSION  HYPERTENSION  CAD  CORONARY ATHEROSCLEROSIS NATIVE CORONARY ARTERY  STROKE  CEREBROVASCULAR DISEASE  GERD    Plan: Continue with current medications I'll discuss again with the neurologist and try to get him where he is more alert. He does have a nursing home bed available for rehabilitation but he's not ready to go yet.    LOS: 10 days   Shakeia Krus L 07/25/2010, 8:38 AM    Subjective: He remains very sleepy. He was admitted with seizure disorder and it was difficult to get his seizures controlled. He is better as far as the seizures  are concerned that he is having a lot of side effects and medications.  Objective: Vital signs in last 24 hours: Temp:  [97.7 F (36.5 C)-98.7 F (37.1 C)] 97.7 F (36.5 C) (07/18 0500) Pulse Rate:  [56-72] 56  (07/18 0500) Resp:  [18-20] 20  (07/18 0500) BP: (119-134)/(65-77) 132/77 mmHg (07/18 0500) SpO2:  [93 %-98 %] 97 % (07/18 0500) Weight change:  Last BM Date: 07/22/10  Intake/Output from previous day: 07/17 0701 - 07/18 0700 In: 3092.1 [P.O.:240; I.V.:2852.1] Out: -   PHYSICAL EXAM General appearance: no  distress, morbidly obese, slowed mentation and Sleepy Resp: rhonchi bilaterally Cardio: regular rate and rhythm, S1, S2 normal, no murmur, click, rub or gallop GI: soft, non-tender; bowel sounds normal; no masses,  no organomegaly Extremities: extremities normal, atraumatic, no cyanosis or edema  Lab Results:  Basename 07/23/10 0445  WBC 11.8*  HGB 15.6  HCT 47.7  PLT 207   BMET  Basename 07/23/10 0445  NA 139  K 3.8  CL 106  CO2 27  GLUCOSE 72  BUN 5*  CREATININE 0.56  CALCIUM 8.6    Studies/Results: No results found.  Medications:  Scheduled:   . amLODipine  5 mg Oral Daily  . aspirin  325 mg Oral Daily  . antiseptic oral rinse   Mouth Rinse BID  . divalproex  1,000 mg Oral BID  . enoxaparin (LOVENOX) injection  40 mg Subcutaneous Q24H  . levETIRAcetam  1,500 mg Oral TID  . midodrine  10 mg Oral TID WC  . pantoprazole  40 mg Oral Daily  . phenelzine  30 mg Oral TID  . simvastatin  20 mg Oral Daily  . sodium chloride       Continuous:   . sodium chloride 125 mL/hr at 07/25/10 9147   WGN:[id number], LORazepam, magnesium hydroxide, nitroGLYCERIN, zolpidem  Assesment: He was admitted with seizure disorder. It took a great deal of medication to get him controlled. He is still sleepy from the medications and we are gradually reducing his medications and he has not had any more seizures. Principal Problem:  *Seizure disorder Active Problems:  Orthostatic hypotension  HYPERLIPIDEMIA  ANXIETY DEPRESSION  HYPERTENSION  CAD  CORONARY ATHEROSCLEROSIS NATIVE CORONARY ARTERY  STROKE  CEREBROVASCULAR DISEASE  GERD    Plan: We will continue with his current treatments. He has orthostatic hypotension from not going to treat his lying hypertension. He has a bed available at a nursing home but he is not ready to go yet.    LOS: 10 days   Lauralyn Shadowens L 07/25/2010, 8:38 AM

## 2010-07-25 NOTE — Progress Notes (Signed)
NAME:  LADISLAV, CASELLI NO.:  [id number]  MEDICAL RECORD NO.:  [id number]  LOCATION:  A303                          FACILITY:  [facility]  PHYSICIAN:  Kacen Mellinger A. Gerilyn Pilgrim, M.D. DATE OF BIRTH:  [birth year]  DATE OF PROCEDURE:  07/25/2010 DATE OF DISCHARGE:                                PROGRESS NOTE   HISTORY OF PRESENT ILLNESS:  No seizures are reported.  Patient seems more awake and responsive today.  PHYSICAL EXAMINATION:  Temperature 98.9, pulse 60, respirations 18, blood pressure 128/72.  HEENT:  Neck is supple.  Head is normocephalic, atraumatic.  ABDOMEN:  Soft.  EXTREMITIES:  No significant edema.  The patient is laying in bed.  His eyes closed, actually opens eyes to light sternal rub and definitely more responsive today.  During his stay, he is doing okay.  He does follow commands briskly on the right side. Cranial nerve evaluation, pupils are reactive.  Coronary reflexes are intact.  Extraocular movements are full.  Facial muscle strength symmetric.  He has again antigravity strength on the left, but increased spasticity.  Right side shows normal tone, bulk and strength.  ASSESSMENT/PLAN: 1. Resolved epilepticus. 2. Encephalopathy due to medication effect.  The patient is on Keppra     for seizure prophylaxis.  We will continue this.  He was supposed     to be off the Depakote, appears that he is still getting this per     the record.  We will have to discontinue, although, he has improved     somewhat in terms of his level of consciousness and arousal.  I     will continue to follow the patient.     Petar Mucci A. Gerilyn Pilgrim, M.D.     KAD/MEDQ  D:  07/25/2010  T:  07/25/2010  Job:  [id number]

## 2010-07-25 NOTE — Progress Notes (Signed)
Physical Therapy Treatment Patient Name: Jeffrey Sweeney Date: 07/25/2010 Problem List:  Patient Active Problem List  Diagnoses  . HYPERLIPIDEMIA  . SPLENIC INFARCTION  . ANXIETY DEPRESSION  . HYPERTENSION  . CAD  . CORONARY ATHEROSCLEROSIS NATIVE CORONARY ARTERY  . STROKE  . CEREBROVASCULAR DISEASE  . EMBOLISM&THROMBOSIS OF OTHER SPECIFIED ARTERY  . Orthostatic hypotension  . GERD  . LOW BACK PAIN, MILD  . SYNCOPE  . EARLY SATIETY  . WEIGHT LOSS, ABNORMAL  . CHEST PAIN  . DIARRHEA  . LUQ PAIN  . BIPOLAR AFFECTIVE DISORDER, HX OF  . PERSONAL HISTORY OF COLONIC POLYPS  . Seizure disorder   Past Medical History:  Past Medical History  Diagnosis Date  . Heart disease   . CAD (coronary artery disease)   . Stroke     left arm weakness   Past Surgical History: History reviewed. No pertinent past surgical history. Precautions/Restrictions  Precautions Precautions: Fall Required Braces or Orthoses: No Restrictions Weight Bearing Restrictions: No Other Position/Activity Restrictions:  (pt will black out with no warning-high fall risk) Mobility (including Balance) Bed Mobility Bed Mobility: Yes Rolling Right: 7: Independent Rolling Left: 7: Independent Right Sidelying to Sit: 3: Mod assist Supine to Sit: 2: Max assist Supine to Sit Details (indicate cue type and reason):  (Pt. tries to assist, bridges hips in order to position in be) Sit to Supine - Right: 2: Max assist Sit to Supine - Right Details (indicate cue type and reason): v.c for using RUE to assist Transfers Transfers: No Sit to Stand: Not tested (comment) Transfer via Lift Equipment:  (have asked nursing staff to transfer Pt. with Maxi Move) Ambulation/Gait Ambulation/Gait: No Ambulation/Gait Assistance:  (significant decline in status since initial eval) Ambulation Distance (Feet): 0 Feet Assistive device: Other (Comment) (n/a) Gait Pattern: Within Functional Limits Gait velocity: n/a Stairs:  No Wheelchair Mobility Wheelchair Mobility: No  Posture/Postural Control Posture/Postural Control: No significant limitations Balance Balance Assessed: Yes Static Sitting Balance Static Sitting - Balance Support: Feet supported;Right upper extremity supported Static Sitting - Level of Assistance: 5: Stand by assistance Static Sitting - Comment/# of Minutes: 12 Dynamic Sitting Balance Dynamic Sitting - Balance Support:  (total assist to sit) Dynamic Sitting - Level of Assistance: 1: +1 Total assist Dynamic Sitting - Balance Activities: Lateral lean/weight shifting Static Standing Balance Static Standing - Balance Support:  (unable to stand) Static Standing - Level of Assistance:  (unable) Exercise  Other Exercises Other Exercises:  ( facilitation of rolling , AROM while supine)  End of Session PT - End of Session Equipment Utilized During Treatment: Other (comment) (maxi move) Activity Tolerance: Patient limited by fatigue Patient left: in bed;with call bell in reach;with bed alarm set Nurse Communication: Need for lift equipment General Behavior During Session: Lethargic (more alert than yesterday, though) Cognition: Impaired (Able to follow 20% of directions, abit more verbal) PT Assessment/Plan  PT - Assessment/Plan Comments on Treatment Session:  (definately showing physical and cognitive improvement) PT Plan: Discharge plan remains appropriate PT Frequency: Min 3X/week Follow Up Recommendations: Skilled nursing facility Equipment Recommended: Defer to next venue PT Goals  Acute Rehab PT Goals PT Goal Formulation: Patient unable to participate in goal setting Time For Goal Achievement: 2 weeks Pt will Roll Supine to Left Side: Other (comment) (goal met) PT Goal: Rolling Supine to Left Side - Progress: Revised (modified due to lack of progress/goal met) Pt will go Supine/Side to Sit: with min assist PT Goal: Supine/Side to Sit - Progress:  Revised (modified due to lack  of progress/goal met) Pt will Sit at [facility] of Bed: with supervision;with no upper extremity support PT Goal: Sit at [facility] Of Bed - Progress: Revised (modified due to lack of progress/goal met) Pt will Ambulate: Independently PT Goal: Ambulate - Progress: Discontinued (comment) (change in medical status-now unable to ambulate)  [name] 07/25/2010, 3:23 PM

## 2010-07-26 LAB — CBC
HCT: 45.3 % (ref 39.0–52.0)
RBC: 4.76 MIL/uL (ref 4.22–5.81)
RDW: 14.3 % (ref 11.5–15.5)
WBC: 9.6 10*3/uL (ref 4.0–10.5)

## 2010-07-26 LAB — COMPREHENSIVE METABOLIC PANEL
Albumin: 2.6 g/dL — ABNORMAL LOW (ref 3.5–5.2)
Alkaline Phosphatase: 93 U/L (ref 39–117)
BUN: 4 mg/dL — ABNORMAL LOW (ref 6–23)
Calcium: 8.9 mg/dL (ref 8.4–10.5)
Creatinine, Ser: 0.5 mg/dL (ref 0.50–1.35)
GFR calc Af Amer: >60 mL/min (ref >60)
Glucose, Bld: 75 mg/dL (ref 70–99)
Potassium: 3.1 mEq/L — ABNORMAL LOW (ref 3.5–5.1)
Total Protein: 6.9 g/dL (ref 6.0–8.3)

## 2010-07-26 LAB — DIFFERENTIAL
Basophils Absolute: 0 10*3/uL (ref 0.0–0.1)
Lymphocytes Relative: 27 % (ref 12–46)
Lymphs Abs: 2.6 10*3/uL (ref 0.7–4.0)
Monocytes Absolute: 1 10*3/uL (ref 0.1–1.0)
Neutro Abs: 5.7 10*3/uL (ref 1.7–7.7)

## 2010-07-26 MED ORDER — POTASSIUM CHLORIDE CRYS ER 20 MEQ PO TBCR
20.0000 meq | EXTENDED_RELEASE_TABLET | Freq: Two times a day (BID) | ORAL | Status: DC
  Administered 2010-07-26 – 2010-07-27 (×3): 20 meq via ORAL
  Filled 2010-07-26 (×3): qty 1

## 2010-07-26 MED ORDER — LEVETIRACETAM 500 MG PO TABS
1000.0000 mg | ORAL_TABLET | Freq: Three times a day (TID) | ORAL | Status: DC
  Administered 2010-07-26 – 2010-07-27 (×2): 1000 mg via ORAL
  Filled 2010-07-26 (×2): qty 2

## 2010-07-26 MED ORDER — FUROSEMIDE 10 MG/ML IJ SOLN
40.0000 mg | Freq: Two times a day (BID) | INTRAMUSCULAR | Status: DC
  Administered 2010-07-26 – 2010-07-27 (×2): 40 mg via INTRAVENOUS
  Filled 2010-07-26 (×2): qty 4

## 2010-07-26 NOTE — Progress Notes (Addendum)
INITIAL ADULT NUTRITION ASSESSMENT Date: 07/26/2010   Time: 4:35 PM Reason for Assessment: Braden=11  ASSESSMENT: Male 65 y.o.  Dx: Seizure disorder  Hx:  Past Medical History  Diagnosis Date  . Heart disease   . CAD (coronary artery disease)   . Stroke     left arm weakness   Related Meds: Pt is on Nardil (Monoamine Oxidase Inhibitor). Dietary staff aware and will avoid sending foods with tyramine.  Ht: 5\' 8"  (172.7 cm)  Wt: 223 lb 8.7 oz (101.4 kg)  Ideal Wt: 63.9 kg 68.4 kg % Ideal Wt: 149%  Usual Wt:Pt unable to provide % Usual Wt: unknown  Body mass index is 33.99 kg/(m^2).  Food/Nutrition Related Hx: Pt is up in chair and more awake today but poor historian.Skin intact. His Braden=11 (high risk for pressure ulcer development).  Labs: Results for SHEPHERD, FINNAN (MRN [id number]) as of 07/26/2010 16:08  Ref. Range 07/18/2010 04:45 07/19/2010 04:35 07/23/2010 04:45 07/24/2010 00:00 07/26/2010 05:06  Sodium Latest Range: 135-145 mEq/L 138 137 139  140  Potassium Latest Range: 3.5-5.1 mEq/L 3.7 3.8 3.8  3.1 (L)  Chloride Latest Range: 96-112 mEq/L 105 104 106  105  CO2 Latest Range: 19-32 mEq/L 27 27 27  29   BUN Latest Range: 6-23 mg/dL 10 9 5  (L)  4 (L)  Creat Latest Range: 0.50-1.35 mg/dL 4.40 1.02 7.25  3.66  Calcium Latest Range: 8.4-10.5 mg/dL 8.6 8.8 8.6  8.9  GFR calc non Af Amer Latest Range: >60 mL/min >60 >60 >60  >60  GFR calc Af Amer Latest Range: >60 mL/min >60 >60 >60  >60  Glucose Latest Range: 70-99 mg/dL 79 76 72  75  Alkaline Phosphatase Latest Range: 39-117 U/L 101    93  Albumin Latest Range: 3.5-5.2 g/dL 2.9 (L)    2.6 (L)  AST Latest Range: 0-37 U/L 39 (H)    12  ALT Latest Range: 0-53 U/L 5    <5  Total Protein Latest Range: 6.0-8.3 g/dL 6.6    6.9  Ammonia Latest Range: 11-60 umol/L    67 (H)   Total Bilirubin Latest Range: 0.3-1.2 mg/dL 0.7    0.3    Intake:  Output:  I/O last 3 completed shifts: In: 7470.8 [P.O.:240; I.V.:7230.8] Out: -     Diet Order: General-Mechanical Soft-po intake improved today. (75% meal consumed). He is receiving assistance with meals prn.   IVF:    sodium chloride Last Rate: 50 mL/hr at 07/26/10 1555    Estimated Nutritional Needs:   Kcal:1953-2129 kcal Protein:95-110 grams Fluid:2.0-2.1 L/d  NUTRITION DIAGNOSIS: None at this time.  MONITORING/EVALUATION(Goals): Pt will consume >75% most meals and maintain current wt between 223-224#. Goal not met.  INTERVENTION: Mechanical Soft diet. Obtain food preferences each meal. Snacks between meals prn.   Dietitian [phone]  DOCUMENTATION CODES Per approved criteria  -Obesity Unspecified    Francene Boyers 07/26/2010, 4:35 PM

## 2010-07-26 NOTE — Progress Notes (Signed)
Subjective: The patient is still sleepy today. No seizures are reported. I did discuss the case with the patient's nurse. She reports that he has been quite sleepy last several hours. She was quite sleepy again when she got up this morning but with much summation he appears to be awake again. His Depakote has been discontinued still has the clonazepam. He is only on the Keppra.     Objective: Vital signs in last 24 hours: Temp:  [97.4 F (36.3 C)-98.9 F (37.2 C)] 97.5 F (36.4 C) (07/19 0600) Pulse Rate:  [54-65] 65  (07/19 0600) Resp:  [18-24] 22  (07/19 0600) BP: (128-153)/(61-82) 153/82 mmHg (07/19 0600) SpO2:  [89 %-95 %] 90 % (07/19 0841)  Intake/Output from previous day: 07/18 0701 - 07/19 0700 In: 6470.8 [P.O.:240; I.V.:6230.8] Out: -  Intake/Output this shift:   Nutritional status: General  PE:   HEENT: Normal  ABDOMEN: soft  EXTREMITIES: No edema   SKIN: Normal by inspection.    MENTAL STATUS:  He set up with eyes partially opened although he is still drowsy. He does report he did okay. He thinks he is in [place] the hospital and that it is 2012. He could not state the month.  CRANIAL NERVES: Pupils are equal, round and reactive to light; extra ocular movements are full, there is no significant nystagmus; again dense left field cut upper and lower facial muscles are normal in strength and symmetric, there is no flattening of the nasolabial folds.  MOTOR: Right side shows normal tone, bulk and strength. He has antigravity strength in the left side with increased tone especially involving the left upper extremity.     Lab Results:  [facility] 07/26/10 0506  WBC 9.6  HGB 15.4  HCT 45.3  PLT 214  NA 140  K 3.1*  CL 105  CO2 29  GLUCOSE 75  BUN 4*  CREATININE 0.50  CALCIUM 8.9  LABA1C --    Lipid Panel No results found for this basename: CHOL,TRIG,HDL,CHOLHDL,VLDL,LDLCALC in the last 72 hours  Studies/Results: No results found.  Medications:  Current facility-administered medications:0.9 %  sodium chloride infusion, , Intravenous, Continuous, Fredirick Maudlin, Last Rate: 125 mL/hr at 07/26/10 0439;  amLODipine (NORVASC) tablet 5 mg, 5 mg, Oral, Daily, Fredirick Maudlin, 5 mg at 07/25/10 1102;  aspirin tablet 325 mg, 325 mg, Oral, Daily, Beryle Beams, MD, 325 mg at 07/25/10 1103;  BIOTENE DRY MOUTH (BIOTENE) solution, , Mouth Rinse, BID, Edward L Hawkins enoxaparin (LOVENOX) injection 40 mg, 40 mg, Subcutaneous, Q24H, Fredirick Maudlin, 40 mg at 07/25/10 1758;  HYDROcodone-acetaminophen (NORCO) 5-325 MG per tablet 1.5 tablet, 1.5 tablet, Oral, Q6H PRN, Judie Bonus Hammons, PHARMD;  levETIRAcetam (KEPPRA) tablet 1,500 mg, 1,500 mg, Oral, TID, Beryle Beams, MD, 1,500 mg at 07/25/10 1617 LORazepam (ATIVAN) injection 0.5 mg, 0.5 mg, Intravenous, Q8H PRN, Gebreselassie Nida, 0.5 mg at 07/19/10 0807;  magnesium hydroxide (MILK OF MAGNESIA) suspension 30 mL, 30 mL, Oral, Daily PRN, Fredirick Maudlin, 30 mL at 07/22/10 1754;  midodrine (PROAMATINE) tablet 10 mg, 10 mg, Oral, TID WC, Fredirick Maudlin, 10 mg at 07/26/10 0845;  nitroGLYCERIN (NITROSTAT) SL tablet 0.4 mg, 0.4 mg, Sublingual, Q5 min PRN, Beryle Beams, MD pantoprazole (PROTONIX) EC tablet 40 mg, 40 mg, Oral, Daily, Beryle Beams, MD, 40 mg at 07/25/10 1000;  phenelzine (NARDIL) tablet 30 mg, 30 mg, Oral, TID, Beryle Beams, MD, 30 mg at 07/25/10 2120;  simvastatin (ZOCOR) tablet 20 mg, 20 mg, Oral, Daily, Datrell Dunton,  MD, 20 mg at 07/25/10 1102;  sodium chloride 0.9 % injection, , , , ;  zolpidem (AMBIEN) tablet 10 mg, 10 mg, Oral, QHS PRN, Beryle Beams, MD DISCONTD: divalproex (DEPAKOTE) EC tablet 1,000 mg, 1,000 mg, Oral, BID, Beryle Beams, MD, 1,000 mg at 07/25/10 1103   Assessment/Plan: 1. The patient is still encephalopathic likely from medications. For today will continue with the current medications. We may consider however reduce the dose of Keppra to 1000 mg t.i.d.  2.  Resolved complex partial status epilepticus.   LOS: 11 days   Miriam Liles

## 2010-07-26 NOTE — Progress Notes (Signed)
Physical Therapy Treatment Patient Name: Jeffrey Sweeney [id number] Date: 07/26/2010 Problem List:  Patient Active Problem List  Diagnoses  . HYPERLIPIDEMIA  . SPLENIC INFARCTION  . ANXIETY DEPRESSION  . HYPERTENSION  . CAD  . CORONARY ATHEROSCLEROSIS NATIVE CORONARY ARTERY  . STROKE  . CEREBROVASCULAR DISEASE  . EMBOLISM&THROMBOSIS OF OTHER SPECIFIED ARTERY  . Orthostatic hypotension  . GERD  . LOW BACK PAIN, MILD  . SYNCOPE  . EARLY SATIETY  . WEIGHT LOSS, ABNORMAL  . CHEST PAIN  . DIARRHEA  . LUQ PAIN  . BIPOLAR AFFECTIVE DISORDER, HX OF  . PERSONAL HISTORY OF COLONIC POLYPS  . Seizure disorder   Past Medical History:  Past Medical History  Diagnosis Date  . Heart disease   . CAD (coronary artery disease)   . Stroke     left arm weakness   Past Surgical History: History reviewed. No pertinent past surgical history. Precautions/Restrictions  Precautions Precautions: Fall Required Braces or Orthoses: No Restrictions Weight Bearing Restrictions: No Other Position/Activity Restrictions:  (pt will black out with no warning-high fall risk) Mobility (including Balance) Bed Mobility Bed Mobility: Yes Rolling Right: 7: Independent Rolling Left: 7: Independent Right Sidelying to Sit: 3: Mod assist (assist to control  trunk) Supine to Sit: 3: Mod assist Supine to Sit Details (indicate cue type and reason):  (n/a) Sit to Supine - Right: Not Tested (comment) Sit to Supine - Right Details (indicate cue type and reason):  (n/a) Transfers Transfers: Yes Sit to Stand: 1: +1 Total assist Sit to Stand Details (indicate cue type and reason):  (with walker, unable to extend hips/knees) Stand to Sit: 1: +1 Total assist Stand to Sit Details:  (control of descent into chair) Stand Pivot Transfers: 1: +1 Total assist Stand Pivot Transfer Details (indicate cue type and reason):  (pt able to assist 20%) Ambulation/Gait Ambulation/Gait: No  Static Sitting Balance Static Sitting -  Balance Support: Feet supported;No upper extremity supported Static Sitting - Level of Assistance: 5: Stand by assistance Static Sitting - Comment/# of Minutes: 10 Dynamic Sitting Balance Dynamic Sitting - Balance Activities: Lateral lean/weight shifting Exercise  Other Exercises Other Exercises:  (sitting PNF for trunk stabilization) Other Exercises:  (seated hip flex, alternating(assist for LLE))  End of Session PT - End of Session Equipment Utilized During Treatment: Gait belt Activity Tolerance: Patient limited by fatigue Patient left: in bed;with call bell in reach Nurse Communication: Need for lift equipment;Mobility status for transfers (staff to use Maxi Move to transfer chair to bed) General Behavior During Session: Lethargic (much more awake than yesterday) Cognition: Impaired PT Assessment/Plan  PT - Assessment/Plan Comments on Treatment Session: significant improvement in mobility/ cognition PT Plan: Discharge plan remains appropriate PT Frequency: Min 3X/week Follow Up Recommendations: Skilled nursing facility Equipment Recommended: Defer to next venue PT Goals  Acute Rehab PT Goals PT Goal: Supine/Side to Sit - Progress: Progressing toward goal PT Goal: Sit at [facility] Of Bed - Progress: Met  [name] 07/26/2010, 3:49 PM

## 2010-07-27 ENCOUNTER — Inpatient Hospital Stay (HOSPITAL_COMMUNITY)
Admission: AD | Admit: 2010-07-27 | Discharge: 2010-08-02 | DRG: 101 | Disposition: A | Discharge status: Skilled Nursing Facility | Payer: Medicare Other | Source: Other Acute Inpatient Hospital | Attending: Family Medicine | Admitting: Family Medicine

## 2010-07-27 ENCOUNTER — Emergency Department (HOSPITAL_COMMUNITY): Payer: Medicare Other

## 2010-07-27 ENCOUNTER — Encounter (HOSPITAL_COMMUNITY): Payer: Self-pay

## 2010-07-27 ENCOUNTER — Emergency Department (HOSPITAL_COMMUNITY)
Admission: EM | Admit: 2010-07-27 | Discharge: 2010-07-27 | Disposition: A | Discharge status: Other Acute Inpatient Hospital | Payer: Medicare Other | Source: Home / Self Care | Attending: Emergency Medicine | Admitting: Emergency Medicine

## 2010-07-27 DIAGNOSIS — R569 Unspecified convulsions: Secondary | ICD-10-CM | POA: Insufficient documentation

## 2010-07-27 DIAGNOSIS — I2510 Atherosclerotic heart disease of native coronary artery without angina pectoris: Secondary | ICD-10-CM | POA: Insufficient documentation

## 2010-07-27 DIAGNOSIS — F17200 Nicotine dependence, unspecified, uncomplicated: Secondary | ICD-10-CM | POA: Insufficient documentation

## 2010-07-27 DIAGNOSIS — Z8673 Personal history of transient ischemic attack (TIA), and cerebral infarction without residual deficits: Secondary | ICD-10-CM | POA: Insufficient documentation

## 2010-07-27 LAB — CBC
HCT: 47.9 % (ref 39.0–52.0)
MCV: 94.9 fL (ref 78.0–100.0)
RDW: 14.6 % (ref 11.5–15.5)
WBC: 9.3 10*3/uL (ref 4.0–10.5)

## 2010-07-27 LAB — COMPREHENSIVE METABOLIC PANEL
AST: 17 U/L (ref 0–37)
CO2: 29 mEq/L (ref 19–32)
Calcium: 9.5 mg/dL (ref 8.4–10.5)
Creatinine, Ser: 0.78 mg/dL (ref 0.50–1.35)
GFR calc Af Amer: >60 mL/min (ref >60)
GFR calc non Af Amer: >60 mL/min (ref >60)
Glucose, Bld: 88 mg/dL (ref 70–99)

## 2010-07-27 LAB — DIFFERENTIAL
Basophils Absolute: 0 10*3/uL (ref 0.0–0.1)
Eosinophils Relative: 3 % (ref 0–5)
Lymphocytes Relative: 29 % (ref 12–46)
Monocytes Absolute: 0.9 10*3/uL (ref 0.1–1.0)

## 2010-07-27 LAB — BASIC METABOLIC PANEL
CO2: 28 mEq/L (ref 19–32)
Chloride: 102 mEq/L (ref 96–112)
Creatinine, Ser: 0.64 mg/dL (ref 0.50–1.35)
Glucose, Bld: 82 mg/dL (ref 70–99)
Sodium: 140 mEq/L (ref 135–145)

## 2010-07-27 MED ORDER — POTASSIUM CHLORIDE CRYS ER 20 MEQ PO TBCR: 20.0000 meq | EXTENDED_RELEASE_TABLET | Freq: Two times a day (BID) | ORAL | Status: DC

## 2010-07-27 MED ORDER — ASPIRIN 325 MG PO TABS: 325.0000 mg | ORAL_TABLET | Freq: Every day | ORAL | Status: AC

## 2010-07-27 MED ORDER — VALPROATE SODIUM 500 MG/5ML IV SOLN
INTRAVENOUS | Status: AC
  Filled 2010-07-27: qty 5

## 2010-07-27 MED ORDER — DIAZEPAM 5 MG/ML IJ SOLN: 5.0000 mg | Freq: Once | INTRAMUSCULAR | Status: DC

## 2010-07-27 MED ORDER — MIDODRINE HCL 10 MG PO TABS: 10.0000 mg | ORAL_TABLET | Freq: Three times a day (TID) | ORAL | Status: AC

## 2010-07-27 MED ORDER — ZOLPIDEM TARTRATE 10 MG PO TABS: 10.0000 mg | ORAL_TABLET | Freq: Every evening | ORAL | Status: DC | PRN

## 2010-07-27 MED ORDER — LEVETIRACETAM 1000 MG PO TABS: 1000.0000 mg | ORAL_TABLET | Freq: Three times a day (TID) | ORAL | Status: DC

## 2010-07-27 MED ORDER — VALPROATE SODIUM 500 MG/5ML IV SOLN
500.0000 mg | Freq: Once | INTRAVENOUS | Status: AC
  Administered 2010-07-27: 500 mg via INTRAVENOUS
  Filled 2010-07-27: qty 5

## 2010-07-27 MED ORDER — CEFUROXIME AXETIL 500 MG PO TABS: 500.0000 mg | ORAL_TABLET | Freq: Two times a day (BID) | ORAL | Status: AC

## 2010-07-27 MED ORDER — AMLODIPINE BESYLATE 5 MG PO TABS: 5.0000 mg | ORAL_TABLET | Freq: Every day | ORAL | Status: DC

## 2010-07-27 MED ORDER — LORAZEPAM 2 MG/ML IJ SOLN
1.0000 mg | Freq: Once | INTRAMUSCULAR | Status: AC
  Administered 2010-07-27: 1 mg via INTRAVENOUS
  Filled 2010-07-27: qty 1

## 2010-07-27 MED ORDER — SODIUM CHLORIDE 0.9 % IV SOLN: Freq: Once | INTRAVENOUS | Status: DC

## 2010-07-27 MED ORDER — ALPRAZOLAM 0.5 MG PO TABS: 0.5000 mg | ORAL_TABLET | Freq: Three times a day (TID) | ORAL | Status: AC | PRN

## 2010-07-27 NOTE — ED Notes (Signed)
Daughter here. Upset and crying. States she cannot let her dad lay over at [facility] like he is. States she does not know what is wrong with him and she would like to have a second opinion.

## 2010-07-27 NOTE — Progress Notes (Signed)
Subjective: He is much more alert today. He is awake and sitting up and able to hold a conversation.he has not had any seizures. He has no other new complaints. He says he feels much better.  Objective: Vital signs in last 24 hours: Temp:  [97.5 F (36.4 C)-98.7 F (37.1 C)] 97.6 F (36.4 C) (07/20 0600) Pulse Rate:  [57-60] 58  (07/20 0600) Resp:  [18-24] 20  (07/20 0600) BP: (103-135)/(63-80) 103/63 mmHg (07/20 0600) SpO2:  [89 %-98 %] 90 % (07/20 0600) Weight change:  Last BM Date: 07/24/10  Intake/Output from previous day: 07/19 0701 - 07/20 0700 In: 2628 [P.O.:200; I.V.:2428] Out: 1350 [Urine:1350]  PHYSICAL EXAM General appearance: alert, cooperative and no distress Resp: clear to auscultation bilaterally Cardio: regular rate and rhythm, S1, S2 normal, no murmur, click, rub or gallop GI: soft, non-tender; bowel sounds normal; no masses,  no organomegaly Extremities: extremities normal, atraumatic, no cyanosis or edema  Lab Results:  [facility] 07/26/10 0506  WBC 9.6  HGB 15.4  HCT 45.3  PLT 214   BMET  Basename 07/27/10 0455 07/26/10 0506  NA 140 140  K 3.3* 3.1*  CL 102 105  CO2 28 29  GLUCOSE 82 75  BUN 8 4*  CREATININE 0.64 0.50  CALCIUM 9.3 8.9    Studies/Results: No results found.  Medications:  Prior to Admission:  Prescriptions prior to admission  Medication Sig Dispense Refill  . atorvastatin (LIPITOR) 40 MG tablet Take 40 mg by mouth daily.        Marland Kitchen HYDROcodone-acetaminophen (NORCO) 7.5-325 MG per tablet Take 1 tablet by mouth every 6 (six) hours as needed. pain       . nitroGLYCERIN (NITROSTAT) 0.4 MG SL tablet Place 0.4 mg under the tongue every 5 (five) minutes as needed. Chest pain      . pantoprazole (PROTONIX) 40 MG tablet Take 40 mg by mouth daily.        . phenelzine (NARDIL) 15 MG tablet Take 30 mg by mouth 3 (three) times daily.         Scheduled:   . amLODipine  5 mg Oral Daily  . aspirin  325 mg Oral Daily  . antiseptic oral  rinse   Mouth Rinse BID  . enoxaparin (LOVENOX) injection  40 mg Subcutaneous Q24H  . furosemide  40 mg Intravenous Q12H  . levETIRAcetam  1,000 mg Oral TID  . midodrine  10 mg Oral TID WC  . pantoprazole  40 mg Oral Daily  . phenelzine  30 mg Oral TID  . potassium chloride  20 mEq Oral BID  . simvastatin  20 mg Oral Daily  . DISCONTD: levETIRAcetam  1,500 mg Oral TID   Continuous:   . DISCONTD: sodium chloride 50 mL/hr at 07/27/10 1610   RUE:[id number], LORazepam, magnesium hydroxide, nitroGLYCERIN, zolpidem  Assesment:he was admitted with seizures. His seizures are better. He was somnolent from his medications and as we've cut back on his medications that has improved. He has multiple other medical problems including coronary artery occlusive disease, a previous stroke, orthostatic hypotension, and anxiety and depression. All of these are stable and I think he is ready for transfer now to skilled care facility for rehabilitation. Principal Problem:  *Seizure disorder Active Problems:  Orthostatic hypotension  HYPERLIPIDEMIA  ANXIETY DEPRESSION  HYPERTENSION  CAD  CORONARY ATHEROSCLEROSIS NATIVE CORONARY ARTERY  STROKE  CEREBROVASCULAR DISEASE  GERD    Plan:he will be discharged to the rehabilitation facility today with plans for him  eventually to return home perhaps with home health services as well. Please see discharge summary for details.    LOS: 12 days   Jeffrey Sweeney 07/27/2010, 12:55 PM

## 2010-07-27 NOTE — ED Notes (Signed)
Pt discharged from hospital to [facility] today. Pt sent back here to be eval or seizure activity

## 2010-07-27 NOTE — Progress Notes (Signed)
Report called to Jeffrey Sweeney at [facility] concerning [name]. Foley and iv d/ced this am.

## 2010-07-27 NOTE — Consult Note (Signed)
Pt d/c today by MD to [facility].  Pt, pt's daughter Crystal, and facility aware and agreeable.  Crystal plans to attempt transportation and if not able, [name] will arrange [facility].  Facility will pick up prescriptions at Dr. Adah Perl office.  FL2 updated and in packet.   Karn Cassis

## 2010-07-27 NOTE — ED Notes (Signed)
Jerky movement has stopped. Pt resting at present with eyes closed. Daughter remains at bedside

## 2010-07-27 NOTE — Progress Notes (Signed)
Patient was urinating about every 20-30 minutes large amounts of urine for which whole bed changes had to be done.  The patient has developed some redness to his buttocks from the urine.  In addition, Lasix was begun today with the next dose due at 02:30 am.  The patient was expressing frustration at having to be rolled and changed so frequently.  He is not able to help turn himself.  For these reasons, I called the doctor on call, Dr. Renard Matter, and explained the situation.  He gave an order for either a condom catheter or a Foley catheter to be placed.  After examining the patient, it was determined that a condom catheter was most likely not going to stay on, especially she the patient has started moving his legs around, even across the rails.  A 16 French Foley was inserted.  Patient tolerated well.

## 2010-07-27 NOTE — ED Notes (Signed)
Pt attempts to raise legs but cannot quiet get them off the bed. Also, attempts to grip but cannot. Pt knows his name and recognizes his daughter but does not know where he is

## 2010-07-27 NOTE — ED Provider Notes (Signed)
History    Daughter states patient had a stroke in the past and had minimal weakness he was able to walk and dress himself.  Chief Complaint  Patient presents with  . Seizures   Patient is a 65 y.o. male presenting with seizures. The history is provided by a relative. The history is limited by the condition of the patient.  Seizures  This is a new (Pt started having seizures about 2 weeks ago and was admitted to the hospital  and started on keppra and depakote. Has been seen by Dr Gerilyn Pilgrim. Pt was discharged today and had a seizure about 30 minutes after they arrived at his rehab facility. ) problem. The current episode started less than 1 hour ago. Characteristics include rhythmic jerking. Characteristics do not include bit tongue.    Past Medical History  Diagnosis Date  . Heart disease   . CAD (coronary artery disease)   . Stroke     left arm weakness    History reviewed. No pertinent past surgical history.  No family history on file.  History  Substance Use Topics  . Smoking status: Current Everyday Smoker -- 1.0 packs/day for 50 years    Types: Cigarettes  . Smokeless tobacco: Not on file  . Alcohol Use: No      Review of Systems  Neurological: Positive for seizures.  All other systems reviewed and are negative.    Physical Exam  BP 131/65  Pulse 79  Temp(Src) 98.8 F (37.1 C) (Oral)  Resp 21  SpO2 93%  Physical Exam  Vitals reviewed. Constitutional: He appears well-developed and well-nourished.  HENT:  Head: Normocephalic and atraumatic.  Eyes: Conjunctivae are normal. Pupils are equal, round, and reactive to light.  Cardiovascular: Normal rate and regular rhythm.   Pulmonary/Chest: Effort normal.  Abdominal: Soft.  Neurological:       Pt is awake and can answer questions, has rhythmic jerking of his abdomen, none in his extremities or face.   Skin: Skin is warm and dry.    ED Course  Procedures  MDM  Pt given ativan 1 mg twice (no valium was  available) and his seizure activity has stopped. Daughter wants him to be transferred to [facility].  EEG was ordered while in hospital but cancelled, Had MRI of brain done showing old Rt MCA stroke with edema c/w status epilepticus.       Ward Givens, MD 07/27/10 2157

## 2010-07-28 ENCOUNTER — Inpatient Hospital Stay (HOSPITAL_COMMUNITY): Payer: Medicare Other

## 2010-07-28 LAB — URINALYSIS, MICROSCOPIC ONLY
Nitrite: POSITIVE — AB
Protein, ur: 30 mg/dL — AB
Urobilinogen, UA: 1 mg/dL (ref 0.0–1.0)

## 2010-07-28 LAB — MRSA PCR SCREENING: MRSA by PCR: POSITIVE — AB

## 2010-07-29 LAB — BASIC METABOLIC PANEL
BUN: 11 mg/dL (ref 6–23)
Creatinine, Ser: 0.64 mg/dL (ref 0.50–1.35)
GFR calc Af Amer: >60 mL/min (ref >60)
GFR calc non Af Amer: >60 mL/min (ref >60)

## 2010-07-29 LAB — CBC
HCT: 46.2 % (ref 39.0–52.0)
MCHC: 34.4 g/dL (ref 30.0–36.0)
MCV: 94.3 fL (ref 78.0–100.0)
RDW: 14.6 % (ref 11.5–15.5)

## 2010-07-30 ENCOUNTER — Inpatient Hospital Stay (HOSPITAL_COMMUNITY): Payer: Medicare Other

## 2010-07-30 NOTE — Progress Notes (Signed)
NAME:  GRAVIEL, PAYEUR NO.:  [id number]  MEDICAL RECORD NO.:  [id number]  LOCATION:                                 FACILITY:  PHYSICIAN:  Tsion Inghram L. Juanetta Gosling, M.D.DATE OF BIRTH:  1945/03/05  DATE OF PROCEDURE: DATE OF DISCHARGE:                                PROGRESS NOTE   Mr. Cypress was admitted with seizures and we had a very difficult time getting his seizures under control.  It required a large amount of medication.  He has done better as far as the seizures are concerned, but became confused and sleepy, probably from all the seizure medications.  He is getting better, but he is not quite ready for discharge yet.  PHYSICAL EXAMINATION:  VITAL SIGNS:  His exam shows his temperature is 97.5, pulse 53, respirations 18, blood pressure 120/65 and O2 sats 91%. GENERAL:  He is more alert, but still pretty confused. CHEST:  Relatively clear with some rhonchi. HEART:  Regular.  His potassium is 3.1.  His hemoglobin is 15.4.  ASSESSMENT:  He has seizure disorder, probably on the basis of a previous stroke.  He has somnolence which we are working on and we cut back on his medications and that is improving.  He is going to need a rehabilitation stay before he can be taken directly home and he understands that.  I think he may be ready tomorrow.  I am going to replace his potassium, cut his IV fluids, and give him a dose of Lasix.     Jayon Matton L. Juanetta Gosling, M.D.     ELH/MEDQ  D:  07/26/2010  T:  07/26/2010  Job:  [id number]

## 2010-07-30 NOTE — Discharge Summary (Signed)
NAME:  OCEAN, SCHILDT NO.:  [id number]  MEDICAL RECORD NO.:  [id number]  LOCATION:                                 FACILITY:  PHYSICIAN:  Oyindamola Key L. Juanetta Gosling, M.D.DATE OF BIRTH:  Apr 26, 1945  DATE OF ADMISSION: DATE OF DISCHARGE:  LH                         DISCHARGE SUMMARY-REFERRING   The patient being transferred to a skilled care facility today.  FINAL DISCHARGE DIAGNOSES: 1. Seizures. 2. Status post stroke. 3. Orthostatic hypotension. 4. Hypertension. 5. Coronary artery occlusive disease. 6. Anxiety. 7. Depression. 8. Hyperlipidemia. 9. Gastroesophageal reflux disease. 10.Chronic low back pain. 11.Deconditioning. 12.Somnolence related to seizure medications. 13.Urinary frequency. 14.Escherichia coli urinary tract infection. 15.Hypokalemia.  HISTORY:  Mr. Jeffrey Sweeney is a 65 year old who came to the emergency room because of blackouts.  He has a long known history of severe orthostatic hypotension and as had to be hospitalized on several occasions.  He has had episodes now where he turns to the left during these blackouts and has had some sort of seizure disorder.  He has a mild left hemiparesis from previous stroke.  He has not had episodes of turning to the left and shaking until the last 2-3 weeks.  PHYSICAL EXAMINATION:  GENERAL:  He smelled strongly of tobacco but says he is not smoking.  He says that because his family has been smoking. HEENT:  Pupils are reactive.  Nose and throat were clear.  Mucous membranes were moist. NECK:  Supple. HEART:  Regular. ABDOMEN:  Soft without masses.  He had a left hemiparesis.  CT done in the emergency room showed his old stroke.  HOSPITAL COURSE:  He had consultation with Dr. Gerilyn Pilgrim and was started on seizure medications.  We had a very difficult time getting his medications to where his seizures were controlled.  He became confused. He also had a urinary tract infection.  By the time of discharge, he  was back more alert, able to participate in physical therapy, he did have some problem with urinary frequency and he is discharged to skilled care facility with plans for him to be on no added salt, actually on a regular diet.  He will have PT, OT and speech as needed and he will be on; 1. Xanax 0.5 mg p.o. t.i.d. p.r.n. anxiety. 2. Amlodipine 5 mg daily. 3. Aspirin 325 mg daily. 4. Ceftin 500 mg b.i.d. x10 days. 5. Keppra 1000 mg t.i.d. 6. Midodrine 10 mg 3 times a day with meals. 7. Potassium chloride 20 mEq 2 tablets 1 tablet b.i.d. 8. Zolpidem 10 mg at bedtime as needed. 9. Atorvastatin 40 mg daily. 10.Hydrocodone with acetaminophen 7.5/325 q.6 h. p.r.n. pain. 11.Nitroglycerin 0.4 mg every 5 minutes as needed for chest pain. 12.Protonix 40 mg daily. 13.Nardil 30 mg 3 times daily.  Plans are when he is discharged from the skilled care facility for him to go home probably with home health assistance.     Jj Enyeart L. Juanetta Gosling, M.D.     ELH/MEDQ  D:  07/27/2010  T:  07/27/2010  Job:  [id number]

## 2010-07-31 DIAGNOSIS — R55 Syncope and collapse: Secondary | ICD-10-CM

## 2010-07-31 LAB — LIPID PANEL
Cholesterol: 126 mg/dL (ref 0–200)
HDL: 27 mg/dL — ABNORMAL LOW
LDL Cholesterol: 80 mg/dL (ref 0–99)
Total CHOL/HDL Ratio: 4.7 ratio
Triglycerides: 94 mg/dL
VLDL: 19 mg/dL (ref 0–40)

## 2010-07-31 LAB — COMPREHENSIVE METABOLIC PANEL WITH GFR
ALT: 5 U/L (ref 0–53)
AST: 18 U/L (ref 0–37)
Albumin: 2.8 g/dL — ABNORMAL LOW (ref 3.5–5.2)
Alkaline Phosphatase: 70 U/L (ref 39–117)
BUN: 7 mg/dL (ref 6–23)
CO2: 28 meq/L (ref 19–32)
Calcium: 9.1 mg/dL (ref 8.4–10.5)
Chloride: 105 meq/L (ref 96–112)
Creatinine, Ser: 0.61 mg/dL (ref 0.50–1.35)
GFR calc Af Amer: >60 mL/min
GFR calc non Af Amer: >60 mL/min
Glucose, Bld: 106 mg/dL — ABNORMAL HIGH (ref 70–99)
Potassium: 3.7 meq/L (ref 3.5–5.1)
Sodium: 142 meq/L (ref 135–145)
Total Bilirubin: 0.3 mg/dL (ref 0.3–1.2)
Total Protein: 6.5 g/dL (ref 6.0–8.3)

## 2010-07-31 LAB — CBC
HCT: 47.2 % (ref 39.0–52.0)
Hemoglobin: 16.2 g/dL (ref 13.0–17.0)
MCH: 32.4 pg (ref 26.0–34.0)
MCHC: 34.3 g/dL (ref 30.0–36.0)
MCV: 94.4 fL (ref 78.0–100.0)
Platelets: 195 10*3/uL (ref 150–400)
RBC: 5 MIL/uL (ref 4.22–5.81)
RDW: 14.6 % (ref 11.5–15.5)
WBC: 8 10*3/uL (ref 4.0–10.5)

## 2010-07-31 NOTE — Procedures (Signed)
EEG NUMBER:  HISTORY:  A 65 year old male admitted with seizure.  MEDICATIONS:  Aspirin, Rocephin, Depakote, Keppra, and Ativan.  CONDITIONS OF RECORDING:  This is a 16-channel EEG carried out with the patient in the drowsy and asleep states.  DESCRIPTION:  The majority of tracing is recorded during drowse.  During this period, there is a mixture of poorly organized theta and delta rhythms noted over all quadrants.  The patient goes into a light sleep briefly with symmetrical sleep spindles and vertex with sharp activity noted superimposed on a poorly organized slow background rhythm. Hypoventilation was not performed.  Intermittent photic stimulation failed to elicit any change in the tracing.  No wakefulness was evaluated during this tracing.  IMPRESSION:  This is a normal asleep EEG.          ______________________________ Thana Farr, MD    ZO:[id number] D:  07/30/2010 19:12:05  T:  07/31/2010 01:22:08  Job #:  [id number]

## 2010-08-01 LAB — HEMOGLOBIN A1C: Hgb A1c MFr Bld: 5.6 % (ref <5.7)

## 2010-08-01 LAB — SEDIMENTATION RATE: Sed Rate: 0 mm/hr (ref 0–16)

## 2010-08-01 LAB — VALPROIC ACID LEVEL: Valproic Acid Lvl: 87.1 ug/mL (ref 50.0–100.0)

## 2010-08-02 LAB — C-REACTIVE PROTEIN: CRP: 0.3 mg/dL — ABNORMAL LOW (ref <0.6)

## 2010-08-02 NOTE — Discharge Summary (Signed)
NAME:  Jeffrey Sweeney, Jeffrey Sweeney NO.:  [id number]  MEDICAL RECORD NO.:  [id number]  LOCATION:  3029                         FACILITY:  [facility]  PHYSICIAN:  Pleas Koch, MD        DATE OF BIRTH:  19-Jun-1945  DATE OF ADMISSION:  07/27/2010 DATE OF DISCHARGE:                              DISCHARGE SUMMARY   PRIMARY CARE PHYSICIAN:  Oneal Deputy. Juanetta Gosling, M.D. at [facility].  DISCHARGE DIAGNOSES: 1. Breakthrough seizures. 2. History of cerebrovascular accident. 3. Questionable history of coronary artery disease. 4. Orthostatic hypotension. 5. Anxiety. 6. Prehypertensive state. 7. Escherichia coli urinary tract infection day 3 of treatment.  DISCHARGE MEDICATIONS: 1. Midrin 10 mg 1 tablet t.i.d. please note Keppra has been changed to     a specialized dosing of 250 mg for 1 day twice daily and then the     patient is instructed to stop. 2. Valproate 750 mg q. 12 hourly. 3. The patient will continue the Ambien 10 mg at bedtime p.r.n. 4. Apra 1 tablet t.i.d. p.r.n. 5. Amlodipine 5 mg 1 tablet daily.  I have discontinued his cefuroxime     in favor of ciprofloxacin for his urinary tract infection.  The     patient is to complete 3 more days of p.o. Cipro 500 b.i.d. 6. Atorvastatin 400 mg 1 tablet daily. 7. Nitroglycerin SL 0.4 mg q. 5 minutes p.r.n. 8. Hydrocodone/APAP 7.5/325 q. 6 p.r.n. 9. Thick-It food thickener 227 grams p.r.n.  His aspirin been     discontinued.  Plavix 75 mg daily with meals. 10.Pantoprazole 40 mg 1 tablet daily. 11.Potassium chloride 1 tablet daily.  IMAGING STUDIES: 1. Chest x-ray July 27, 2010, showed mild chronic bronchitic type lung     changes, but no acute findings. 2. CT ahead July 28, 2010, showed increase hypodensity within right     temporal lobe, most in keeping with evolution with subacute     attention of followup.  No areas of hemorrhage, otherwise chronic     change without appreciable change. 3. MRI of brain July 31, 2010 right  hemispheric acute infarct versus     postictal phenomenon, remote chronic hemorrhagic right temporal     parietal infarct.  Atrophy of chronic microvascular ischemic     changed.  Right thalamic focus is restricted diffusion could     represent different vascular territory PCA infarct be secondary to     cortical seizure activity. 4. EEG done showed a normal asleep EEG July 30, 2010. 5. Echocardiogram done July 31, 2010 showed EF of 55%-60% no wall     motion abnormalities.  Left ventricular diastolic parameters were     normal.  There was no defect or patent foremen ovale identified.  HOSPITAL COURSE:  Briefly this is a 65 year old male recently discharged by Dr. Juanetta Gosling from [name] who was discharged to rehab.  He went to rehab and then had a breakthrough seizure came to the emergency room and the patient was then transferred over to [facility].  He was postictal has only one grandson essentially who saw him.  History was very limited.  1. Seizures/CVA.  The patient was admitted and neurology  was     consulted.  It was thought initially that he may have had a     breakthrough seizure and as such his medications were simplified     with the help of Neurology.  He will be kept on the valproic acid     and he is on tapering instructions for his Keppra.  He had a workup     inclusive MRI, MRA and echocardiogram which did not reveal any new     CVA as such.  Although he has had CVA in the past he will need SNF.     Neurology did think that he would be better served being on Plavix     and we will continue with those recommendations. 2. E. coli UTI.  He was found to have a coincidental UTI and is on day     3 of ciprofloxacin 500 b.i.d. He will continue on the same in the     interim 3. History of CAD.  It seems in the past he has been seen by Dr. Sheppard Penton     orthostatic hypotension and it is unclear to me whether he actually     had CAD.  His echo was done which showed no specific  abnormalities     hence we will keep him on his midodrine and he will be reviewed.     He is doing well in the hospital. 4. Hypertension and history of orthostatic hypotension.  Again the     patient will be continued on his Norvasc only.  We will attempt to     avoid AV nodal blocking agents. 5. Anxiety.  The patient is on lorazepam and will continue this 1 mg     q.3 hourly.  He has been on Nardil in the past and this has been     discontinued as this an MAO inhibitor and this can decrease the     seizure threshold as well as cause orthostasis. 6. Hypertension.  His blood pressure is actually pretty well     controlled off of his Norvasc.  I would have him and this just     followed up as an outpatient.  I did try to call Fabyan Loughmiller at     [phone] to update him.  I did not get an answer.  I will attempt     to do so again today.  The patient was seen on day of discharge was     doing fairly well.  He was somnolent, but had no other issues.  VITAL SIGNS:  Temperature 95, pulse 83, respirations 20, blood pressures 110-121/64-79, O2 sats 92% on room air. CHEST: Clinically clear.  The patient was laughing inappropriately however, had no other issues.  S1-S2, no murmurs, rubs or gallops. ABDOMEN: Soft, nontender, nondistended.  The patient will be discharged to nursing facility when available.  Please note the dictation is not final unless signed by the dictating physician.          ______________________________ Pleas Koch, MD     JS/MEDQ  D:  08/02/2010  T:  08/02/2010  Job:  [id number]  cc:   Ramon Dredge L. Juanetta Gosling, M.D.  Electronically Signed by Pleas Koch MD on 08/02/2010 04:44:25 PM

## 2010-08-02 NOTE — H&P (Signed)
NAME:  Jeffrey Sweeney, Jeffrey Sweeney NO.:  [id number]  MEDICAL RECORD NO.:  [id number]  LOCATION:  3311                         FACILITY:  [facility]  PHYSICIAN:  Tarry Kos, MD       DATE OF BIRTH:  06/22/1945  DATE OF ADMISSION:  07/27/2010 DATE OF DISCHARGE:                             HISTORY & PHYSICAL   CHIEF COMPLAINT:  Seizure.  HISTORY OF PRESENT ILLNESS:  Mr. Smelser is a 65 year old male who apparently was just discharged today by Dr. Juanetta Gosling who has been at [facility] the last 2 weeks for seizure disorder, who was discharged to rehab today.  He went to rehab today and had another breakthrough seizure.  He came back to the emergency department at [facility] and due to the fact that we do not have a neurologist available over the weekend and the family is demanding the patient be transferred to [facility].  I am being asked to transfer the patient to [facility].  He is currently postictal.  There is only one grandson available, however, apparently the daughter who was active in his care, was the one requesting for him to be transferred to [facility], which I think is appropriate.  However, the daughter is not available right now for questioning.  So, history is very limited.  There is no discharge summary from a today by Dr. Juanetta Gosling. There is a progress note which states that he was alert and up in bed this morning and holding a conversation.  It appears that he had not had a seizure for over 24 hours.  Neurology was involved in his hospitalization here.  It is also not clear as to what medications he was discharged on again as there is no discharge summary done.  REVIEW OF SYSTEMS:  Otherwise unobtainable.  PAST MEDICAL HISTORY: 1. Seizure disorder with recent status epilepticus, hospitalization     for 2 weeks at [facility]. 2. History of CVA. 3. Coronary artery disease. 4. Anxiety/depression. 5. Orthostatic hypotension. 6. GERD. 7. Chronic low back pain. 8.  Hyperlipidemia.  SOCIAL HISTORY:  It is currently unobtainable, but he was recently discharged to rehab center.  MEDICATIONS:  Per according to his progress note from today by Dr. Juanetta Gosling, he was on: 1. Lipitor 40 mg daily, 2. Hydrocodone. 3. Nitroglycerin sublingual. 4. Protonix 40 mg daily. 5. Nardil 30 mg 3 times a day. 6. Amlodipine 5 mg a day. 7. Aspirin 325 mg a day. 8. Lasix 40 mg q.12. 9. Keppra 1000 mg 3 times a day. 10.Midodrine 10 mg 3 times a day. 11.KCl. 12.It looks like that his levetiracetam was recently decreased from     1500 mg few times a day to 1000 mg 3 times a day.  Of course, this is from progress note.  This is not actual discharge summary.  PHYSICAL EXAMINATION:  VITAL SIGNS:  Temperature 98.1 pulses 69, respirations 20, blood pressure 127/66, 95% O2 sats on room air. GENERAL:  He is postictal.  He does arouse to voice.  He is in no apparent distress, breathing normally. HEENT:  Extraocular muscles intact.  Pupils equal and reactive to light. Oropharynx is clear.  Mucous membranes are  moist. NECK:  No JVD.  No carotid bruits. CARDIAC:  Regular rate and rhythm without murmurs or gallops. CHEST:  Clear to auscultation bilaterally.  No wheezes, rhonchi, or rales. ABDOMEN:  Soft, nontender, nondistended.  Positive bowel sounds.  No hepatosplenomegaly. EXTREMITIES:  No clubbing, cyanosis, or edema. PSYCHIATRY:  Postictal. NEUROLOGIC:  No focal neurologic deficits.  He does follow simple commands.  LABORATORY DATA:  Electrolytes are normal.  BUN and creatinine are normal.  LFTs normal.  White count normal, hemoglobin normal.  Valproic acid level is 25.3, glucose is 88.  Chest x-ray, no infiltrate or heart failure.  ASSESSMENT AND PLAN:  This is a 65 year old male with uncontrolled seizures. Uncontrolled seizures.  The patient is being transferred to [facility] for Neurology evaluation and per family request.  Neurology is aware according to Dr.  Lynelle Doctor.  Dr. Lynelle Doctor had already put in for telemetry bed, however, the patient will need a step-down bed.  Ambulance is already here to pick up the patient at [facility].  We are trying to get him a step-down bed due to his uncontrolled seizures and his recent status epilepticus state.  As soon as a step-down bed is available, he will be transferred by ambulance to [facility] for further evaluation.  I have written orders to clarify his home medications since we are now on a different system in [place].  I cannot put in his medications until epic, so this will have to be addressed once he gets to [facility].  I have written an order to call me with his medication reconciliation form once it is done at [facility].  I am assuming he is a full code.  This will also need to be readdressed as his daughter is not here for me right now and the patient cannot relay this information and there is no final discharge summary done yet.          ______________________________ Tarry Kos, MD     RD/MEDQ  D:  07/27/2010  T:  07/28/2010  Job:  [id number]  Electronically Signed by Tarry Kos MD on 08/02/2010 11:51:17 AM

## 2010-08-03 NOTE — Consult Note (Signed)
NAME:  Jeffrey Sweeney, Jeffrey Sweeney NO.:  [id number]  MEDICAL RECORD NO.:  [id number]  LOCATION:  3311                         FACILITY:  [facility]  PHYSICIAN:  Thana Farr, MD    DATE OF BIRTH:  08-21-1945  DATE OF CONSULTATION:  07/28/2010 DATE OF DISCHARGE:                                CONSULTATION   CHIEF COMPLAINT:  Seizures.  HISTORY:  This is a 65 year old black male with a history of status epilepticus treated at [facility] over the last 2 weeks with Keppra and Depakote.  Seizures became controlled and he was discharged yesterday to rehab facility on Keppra only.  Upon arrival to the skilled nursing facility, he had a breakthrough seizure and was transferred to [facility].  Of note, the patient was being treated with Ceftin for UTI during his past hospitalization and labs this admission show UTI persisting.  The patient is now on IV Depacon 500 mg q. 12 hours and Keppra.  The patient has had no further seizure activity.  Neuro consult has been requested to assist with seizure management.  PAST MEDICAL HISTORY:  Significant for hypertension, hyperlipidemia, and coronary artery disease.  He also has peripheral vascular disease with documented right ICA stenosis and right MCA stenosis back in 2007. Intervention was recommended and the patient refused.  He had a CVA in 2007, involving the right parietal basal ganglia area.  He also has a history of a severe orthostatic hypotension, GERD with gastric ulcer and erosion, anxiety, depression, bipolar disorder, and a history of medicine noncompliance.  There was mention in his old records of being concerned for hypercoagulable state and was on Coumadin therapy at one point last year but the records are inconclusive  as to the results of this.  CURRENT MEDICATIONS: 1. Keppra 1000 mg t.i.d. 2. Aspirin 300 mg per rectum daily. 3. Rocephin 1 g q. 24 hours. 4. Valproate 500 mg IV q.12  hours.  ALLERGIES:  DEMEROL.  FAMILY HISTORY:  Noncontributory to this consultation.  SOCIAL HISTORY:  The patient smokes about half a pack a day.  Drinks alcohol.  Widowed.  On Social security disability for back injury. Majority of history is supplied through record.  The complete system is difficult to obtain secondary to lack of cooperation and lethargy.  The daughter was present during Dr. Thad Ranger' examination and reported that the patient to be independently functioning 2 weeks ago.  When he was discharged to this facility yesterday, he was extremely lethargic and unable to mobilize by himself.  He is able to open his eyes and answer some basic questions today.  He reports this as an improvement in his mentation over the last 2 weeks.  PHYSICAL EXAMINATION:  VITAL SIGNS:  Blood pressure is 120/69, pulse 62, respirations 14, temperature 97.4, SAO2 94% on 4 liters. GENERAL:  The patient is lethargic.  He  will awaken to verbal stimulation and is alert to self and time.  He is able to follow two- step commands with some difficulty due to lethargy primarily. HEENT:  PERRL.  EOMI.  Unable to assess visual fields due to lack of cooperation. NEURO:  Tongue is midline.  Slight dysarthric  speech.  Motor 5/5 right upper and bilateral lower extremities.  4+/5 left upper extremity. Grips are weak bilaterally, left worse than right.  Grip difficult to assess due to lack of cooperation.  Finger-to-nose seems to be intact on the right, unable to perform on the left.  Heel-to-shin intact on the right, unable to perform on the left.  Fine motor movement is poor on the left.  His DTRs are 2+ in the upper extremities, patellars, and Achilles.  He has upgoing plantars.  Sensation appears to be intact to light touch.  LABORATORIES:  Urinalysis is positive for UTIs.  Cultures pending. Sodium 139, potassium 4.0, BUN 13, creatinine 0.78.  Glucose 88.  WBC 9.3, hemoglobin 16.1, platelets 261.   Valproic acid level low at 25.3.  IMAGING STUDIES:  An EEG July 17, 2010, does show seizure activity in numbers 1 and 3, right hemispheric slowing.  MRI of the brain July 19, 2010, showed intense diffusion abnormality of gyriform pattern and associated edema.  No mass or hemorrhage.  This favor seizure activity over infarct.  ASSESSMENT:  This is a 65 year old white male with multiple medical problems including history of stroke and residual left hemiparesis, coronary artery disease, possible coagulopathy, ongoing tobacco use and medicine noncompliance.  He presents with recurrent seizures after recent status epilepticus.  Presently, he has lethargy, mild dysarthria and left hemiparesis.  This is reportedly an improved level of alertness from last 2 weeks.  (Details provided by daughter).  PLAN: 1. Continue current Keppra and Depacon dose.  The daughter mentions     that the patient has been seeing spiders and has "crazy thoughts"     at times.  It is difficult to assess whether this was happening     before the patient's seizures began are not.  Keppra can be     associated with hallucinations.  Due to the patient's history of     psychiatric illness and visual hallucination, we will plan to     slowly wean Keppra off and increase Depakote to maintain control of     seizure activity. 2. UTI - infection.  This will certainly decrease seizure threshold.     Treat accordingly. 3. Consider discontinuation of Nardil therapy - the patient has severe     orthostasis and seizures.  Both of these can be exacerbated by     Nardil. 4. History of coagulopathy - unclear as to whether hypercoag panel was     completed.  The patient has multiple risk factors for progressive     cerebrovascular and cardiovascular disease.  Consider hypercoag     workup while in hospital. 5. Speech Therapy, Occupational Therapy and Physical Therapy as     previously ordered.  Thank you for allowing [place] to assist  in the management of this patient. The patient has been seen and examined by Dr. Thana Farr.  We will follow along.     Luan Moore, P.A.   ______________________________ Thana Farr, MD    TCJ/MEDQ  D:  07/28/2010  T:  07/29/2010  Job:  [id number]  Electronically Signed by Delice Bison JERNEJCIC P.A. on 07/29/2010 08:12:38 AM Electronically Signed by Thana Farr MD on 08/03/2010 10:37:28 PM

## 2010-08-07 ENCOUNTER — Encounter (HOSPITAL_COMMUNITY): Payer: Medicare Other | Admitting: Psychiatry

## 2010-10-01 LAB — BASIC METABOLIC PANEL
Chloride: 109
GFR calc Af Amer: >60
Potassium: 3.3 — ABNORMAL LOW
Sodium: 136

## 2010-10-01 LAB — B-NATRIURETIC PEPTIDE (CONVERTED LAB): Pro B Natriuretic peptide (BNP): <30

## 2010-11-13 ENCOUNTER — Ambulatory Visit (INDEPENDENT_AMBULATORY_CARE_PROVIDER_SITE_OTHER): Payer: Medicare Other | Admitting: Psychiatry

## 2010-11-13 ENCOUNTER — Encounter (HOSPITAL_COMMUNITY): Payer: Self-pay | Admitting: Psychiatry

## 2010-11-13 DIAGNOSIS — F331 Major depressive disorder, recurrent, moderate: Secondary | ICD-10-CM

## 2010-11-13 MED ORDER — PHENELZINE SULFATE 15 MG PO TABS: 30.0000 mg | ORAL_TABLET | Freq: Three times a day (TID) | ORAL | Status: DC

## 2010-11-13 NOTE — Progress Notes (Signed)
Patient came today for his followup appointment he was last seen on 05/15/2010. Patient told he was admitted to [facility] hospital and then later transferred to [facility] due to uncontrolled seizures. He told he was found confused when  his family member called 911. He  he do not recall the details of the hospitalization. I reviewed the history it appears that patient was given Keppra and later Depakote was added to his regime. The patient has been taking his medication from from Dr. Ollen Bowl. He is taking Nardil for his depression which has been helping him a lot. He is sleeping better but he continued to have depressive symptoms including social isolation anxiety and worrying about the future. He reported no side effects of medication. The patient has multiple medical problems and lately his memory is also not very good. He tends to forget taking medication on time. Per her discharge summary in July he is taking both Kepra and Depakote. His last Depakote level was 87.1. He is also taking Ambien and Xanax prescribed by Dr. Ollen Bowl. I have reviewed his medication but patient is unclear about his seizure medication. He denied any recent seizure activity. He feels his depression has been much control but the Nardil. Per history patient has tried multiple antidepressants in the past with poor response. Mental status examination The patient is fairly groomed appears anxious and maintained fair eye contact. His speech is soft but clear and coherent. His thought process is slow but logical. His attention and concentration was distracted at times but he denies any active or passive suicidal thinking homicidal thinking or any visual or auditory hallucination. He has poor memory and has difficulty recalling medication name and events in the past. There were no psychosis present he is alert and oriented x3 his insight judgment and impulse control were outside. Assessment Maj. depressive disorder recurrent Plan I will  continue his medication Nardil 15 mg 2 tablets 3 times a day. I explain in detail the risks and benefits of medication including dietary restriction and hypertensive crisis. Patient has taking Nardil for many years. Know side effects and benefits of the medication. I recommended him to bring list of medication on the next followup appointment so we can clarify his seizure medicines. I will see him again in 2 months. A new prescription of Nardil given with one refill. Time spent 25 minutes.

## 2010-12-08 ENCOUNTER — Other Ambulatory Visit (HOSPITAL_COMMUNITY): Payer: Self-pay | Admitting: Psychiatry

## 2011-01-15 ENCOUNTER — Ambulatory Visit (INDEPENDENT_AMBULATORY_CARE_PROVIDER_SITE_OTHER): Payer: Medicare Other | Admitting: Psychiatry

## 2011-01-15 ENCOUNTER — Encounter (HOSPITAL_COMMUNITY): Payer: Self-pay | Admitting: Psychiatry

## 2011-01-15 VITALS — Wt 240.0 lb

## 2011-01-15 DIAGNOSIS — F331 Major depressive disorder, recurrent, moderate: Secondary | ICD-10-CM

## 2011-01-15 MED ORDER — PHENELZINE SULFATE 15 MG PO TABS: 30.0000 mg | ORAL_TABLET | Freq: Three times a day (TID) | ORAL | Status: DC

## 2011-01-15 NOTE — Patient Instructions (Signed)
Please bring all your medication on your next appointment. Your abilify and xanax is prescribed by your Primary care Physcian

## 2011-01-15 NOTE — Progress Notes (Signed)
Patient came today for his followup appointment. He is compliant with his medication Nardil for his depression. He brought list of medication which includes Xanax Abilify Nardil valproic acid Norvasc aspirin nitroglycerin and protonix. His Xanax and Abilify is prescribed by his primary care physician Dr. Ollen Bowl. He reported his seizure has much control any he does not have any more seizure like episodes in past 2 months. He feels his depression is much control with Nardil. He reported no side effects of medication. He is sleeping better and has been more involved in his daily life. He has a good Christmas. Is unclear if she is taking Keppra along with Depakote. He does not want to change his medication. Per history patient has tried multiple antidepressants in the past with poor response.  Mental status examination The patient is fairly groomed appears anxious but maintained fair eye contact. His speech is soft but clear and coherent. His thought process is slow but logical. His attention and concentration is fair and improved from the past. He denies any active or passive suicidal thinking homicidal thinking or any visual or auditory hallucination. He has poor memory and has difficulty recalling medication name and events in the past. There were no psychosis present he is alert and oriented x3 his insight judgment and impulse control were okay.   Assessment  Maj. depressive disorder recurrent  Plan I will continue his medication Nardil 15 mg 2 tablets 3 times a day. I explain in detail the risks and benefits of medication including dietary restriction and hypertensive crisis. Patient has taking Nardil for many years. Know side effects and benefits of the medication. I recommended him to bring list of medication on the next followup appointment so we can clarify his seizure medicines. I will see him again in 2 months. A new prescription of Nardil given with one refill.

## 2011-02-09 ENCOUNTER — Encounter (HOSPITAL_COMMUNITY): Payer: Self-pay | Admitting: Emergency Medicine

## 2011-02-09 ENCOUNTER — Other Ambulatory Visit: Payer: Self-pay

## 2011-02-09 ENCOUNTER — Emergency Department (HOSPITAL_COMMUNITY): Payer: Medicare Other

## 2011-02-09 ENCOUNTER — Emergency Department (HOSPITAL_COMMUNITY)
Admission: EM | Admit: 2011-02-09 | Discharge: 2011-02-10 | Disposition: A | Discharge status: Home / Self Care | Payer: Medicare Other | Attending: Emergency Medicine | Admitting: Emergency Medicine

## 2011-02-09 DIAGNOSIS — F3289 Other specified depressive episodes: Secondary | ICD-10-CM | POA: Insufficient documentation

## 2011-02-09 DIAGNOSIS — I498 Other specified cardiac arrhythmias: Secondary | ICD-10-CM | POA: Insufficient documentation

## 2011-02-09 DIAGNOSIS — Z7982 Long term (current) use of aspirin: Secondary | ICD-10-CM | POA: Insufficient documentation

## 2011-02-09 DIAGNOSIS — I2510 Atherosclerotic heart disease of native coronary artery without angina pectoris: Secondary | ICD-10-CM | POA: Insufficient documentation

## 2011-02-09 DIAGNOSIS — Z8673 Personal history of transient ischemic attack (TIA), and cerebral infarction without residual deficits: Secondary | ICD-10-CM | POA: Insufficient documentation

## 2011-02-09 DIAGNOSIS — R569 Unspecified convulsions: Secondary | ICD-10-CM | POA: Insufficient documentation

## 2011-02-09 DIAGNOSIS — F17200 Nicotine dependence, unspecified, uncomplicated: Secondary | ICD-10-CM | POA: Insufficient documentation

## 2011-02-09 DIAGNOSIS — F329 Major depressive disorder, single episode, unspecified: Secondary | ICD-10-CM | POA: Insufficient documentation

## 2011-02-09 DIAGNOSIS — I519 Heart disease, unspecified: Secondary | ICD-10-CM | POA: Insufficient documentation

## 2011-02-09 LAB — COMPREHENSIVE METABOLIC PANEL
ALT: 9 U/L (ref 0–53)
CO2: 25 mEq/L (ref 19–32)
Calcium: 9.4 mg/dL (ref 8.4–10.5)
Chloride: 98 mEq/L (ref 96–112)
Creatinine, Ser: 0.87 mg/dL (ref 0.50–1.35)
GFR calc Af Amer: >90 mL/min (ref >90)
GFR calc non Af Amer: 89 mL/min — ABNORMAL LOW (ref >90)
Glucose, Bld: 100 mg/dL — ABNORMAL HIGH (ref 70–99)
Sodium: 133 mEq/L — ABNORMAL LOW (ref 135–145)
Total Bilirubin: 0.5 mg/dL (ref 0.3–1.2)

## 2011-02-09 LAB — CBC
HCT: 46.2 % (ref 39.0–52.0)
Hemoglobin: 15.8 g/dL (ref 13.0–17.0)
MCV: 93.5 fL (ref 78.0–100.0)
RBC: 4.94 MIL/uL (ref 4.22–5.81)
WBC: 16 10*3/uL — ABNORMAL HIGH (ref 4.0–10.5)

## 2011-02-09 LAB — DIFFERENTIAL
Eosinophils Relative: 0 % (ref 0–5)
Lymphocytes Relative: 10 % — ABNORMAL LOW (ref 12–46)
Lymphs Abs: 1.5 10*3/uL (ref 0.7–4.0)
Monocytes Absolute: 1.1 10*3/uL — ABNORMAL HIGH (ref 0.1–1.0)
Neutro Abs: 13.3 10*3/uL — ABNORMAL HIGH (ref 1.7–7.7)

## 2011-02-09 LAB — ETHANOL: Alcohol, Ethyl (B): <11 mg/dL (ref 0–11)

## 2011-02-09 LAB — VALPROIC ACID LEVEL: Valproic Acid Lvl: <10 ug/mL — ABNORMAL LOW (ref 50.0–100.0)

## 2011-02-09 MED ORDER — SODIUM CHLORIDE 0.9 % IV BOLUS (SEPSIS)
500.0000 mL | Freq: Once | INTRAVENOUS | Status: DC
Start: 2011-02-09 — End: 2011-02-10

## 2011-02-09 MED ORDER — DIVALPROEX SODIUM 250 MG PO DR TAB
500.0000 mg | DELAYED_RELEASE_TABLET | Freq: Once | ORAL | Status: AC
  Administered 2011-02-09: 500 mg via ORAL
  Filled 2011-02-09: qty 2

## 2011-02-09 MED ORDER — LORAZEPAM 2 MG/ML IJ SOLN
INTRAMUSCULAR | Status: AC
  Filled 2011-02-09: qty 1

## 2011-02-09 MED ORDER — ASPIRIN 81 MG PO CHEW
324.0000 mg | CHEWABLE_TABLET | Freq: Once | ORAL | Status: AC
  Administered 2011-02-09: 324 mg via ORAL
  Filled 2011-02-09: qty 4

## 2011-02-09 MED ORDER — ALBUTEROL SULFATE (5 MG/ML) 0.5% IN NEBU
2.5000 mg | INHALATION_SOLUTION | Freq: Once | RESPIRATORY_TRACT | Status: AC
  Administered 2011-02-09: 2.5 mg via RESPIRATORY_TRACT
  Filled 2011-02-09: qty 0.5

## 2011-02-09 NOTE — ED Provider Notes (Signed)
This chart was scribed for [employer]. Colon Branch, MD by Williemae Natter. The patient was seen in room APA16A/APA16A at 6:58 PM.  CSN: [id number]  Arrival date & time 02/09/11  1814   First MD Initiated Contact with Patient 02/09/11 1826      Chief Complaint  Patient presents with  . Seizures    (Consider location/radiation/quality/duration/timing/severity/associated sxs/prior treatment) Patient is a 66 y.o. male presenting with seizures. The history is provided by the patient.  Seizures  This is a chronic problem. The current episode started 1 to 2 hours ago. The problem has been gradually improving. There were 2 to 3 seizures. Associated symptoms include muscle weakness. Pertinent negatives include no vomiting and no diarrhea. Characteristics include rhythmic jerking. The episode was witnessed. There was no sensation of an aura present. The seizures continued in the ED. The seizure(s) had left-sided focality. There has been no fever.   Pt was driving when his stomach and legs started jerking. Pt has history of seizures and reports that his last seizure was 1 1/2 months ago. Seizures were witnessed in ED with jerking towards the left side. No loss of consciousness.  PCP-Dr. Garnette Czech Past Medical History  Diagnosis Date  . Heart disease   . CAD (coronary artery disease)   . Stroke     left arm weakness  . Seizures   . Depression     Past Surgical History  Procedure Date  . Ankle fracture surgery     Family History  Problem Relation Age of Onset  . Cancer Mother   . Cancer Father     History  Substance Use Topics  . Smoking status: Current Everyday Smoker -- 0.5 packs/day for 50 years    Types: Cigarettes  . Smokeless tobacco: Never Used  . Alcohol Use: No      Review of Systems  Gastrointestinal: Negative for vomiting and diarrhea.  Neurological: Positive for seizures.   10 Systems reviewed and are negative for acute change except as noted in the HPI.  Allergies    Meperidine hcl  Home Medications   Current Outpatient Rx  Name Route Sig Dispense Refill  . ALPRAZOLAM 1 MG PO TABS Oral Take 1 mg by mouth at bedtime as needed.      Marland Kitchen AMLODIPINE BESYLATE 5 MG PO TABS Oral Take 1 tablet (5 mg total) by mouth daily. 30 tablet 12  . ARIPIPRAZOLE 10 MG PO TABS Oral Take 10 mg by mouth daily.      . ASPIRIN 325 MG PO TABS Oral Take 1 tablet (325 mg total) by mouth daily. 30 tablet 12  . ATORVASTATIN CALCIUM 40 MG PO TABS Oral Take 40 mg by mouth daily.      Marland Kitchen HYDROCODONE-ACETAMINOPHEN 7.5-325 MG PO TABS Oral Take 1 tablet by mouth every 6 (six) hours as needed. pain     . LEVETIRACETAM 1000 MG PO TABS Oral Take 1 tablet (1,000 mg total) by mouth 3 (three) times daily. 90 tablet 5  . NITROGLYCERIN 0.4 MG SL SUBL Sublingual Place 0.4 mg under the tongue every 5 (five) minutes as needed. Chest pain    . PANTOPRAZOLE SODIUM 40 MG PO TBEC Oral Take 40 mg by mouth daily.      Marland Kitchen PHENELZINE SULFATE 15 MG PO TABS Oral Take 2 tablets (30 mg total) by mouth 3 (three) times daily. 180 tablet 1  . POTASSIUM CHLORIDE CRYS ER 20 MEQ PO TBCR Oral Take 1 tablet (20 mEq total) by mouth 2 (two)  times daily. 60 tablet 12  . VALPROIC ACID 250 MG/5ML PO SYRP Oral Take 250 mg by mouth 2 (two) times daily.        BP 144/78  Pulse 101  Temp(Src) 98.8 F (37.1 C) (Oral)  Resp 16  Ht 5\' 8"  (1.727 m)  Wt 247 lb (112.038 kg)  BMI 37.56 kg/m2  SpO2 91%  Physical Exam  Nursing note and vitals reviewed. Constitutional: He is oriented to person, place, and time. He appears well-developed and well-nourished.  HENT:  Head: Normocephalic and atraumatic.       Poor dentiton  Neck: Normal range of motion. Neck supple.       No carotid bruit  Cardiovascular: Normal rate, regular rhythm and normal heart sounds.   Pulmonary/Chest: He has no wheezes.       diffuse rhonchi  Abdominal: Soft. Bowel sounds are normal.  Musculoskeletal:       Left leg weakness   Neurological: He is  alert and oriented to person, place, and time. No sensory deficit.       Left sided weakness. Left arm is essentially paralyzed. Left arm 2/5 strength from previous CVA. Left leg weakness is new.  Skin: Skin is warm and dry.  Psychiatric: He has a normal mood and affect. His behavior is normal.    ED Course  Procedures (including critical care time) DIAGNOSTIC STUDIES: Oxygen Saturation is 91% on nasal cannula, adequate by my interpretation.    COORDINATION OF CARE:  Medications  LORazepam (ATIVAN) 2 MG/ML injection (not administered)  HYDROcodone-acetaminophen (LORTAB) 7.5-500 MG per tablet (not administered)    Date: 02/09/2011  1853  Rate: 91  Rhythm: normal sinus rhythm and sinus arrhythmia  QRS Axis: normal  Intervals: normal  ST/T Wave abnormalities: normal  Conduction Disutrbances:none  Narrative Interpretation:   Old EKG Reviewed: none available Results for orders placed during the hospital encounter of 02/09/11  CBC      Component Value Range   WBC 16.0 (*) 4.0 - 10.5 (K/uL)   RBC 4.94  4.22 - 5.81 (MIL/uL)   Hemoglobin 15.8  13.0 - 17.0 (g/dL)   HCT 16.1  09.6 - 04.5 (%)   MCV 93.5  78.0 - 100.0 (fL)   MCH 32.0  26.0 - 34.0 (pg)   MCHC 34.2  30.0 - 36.0 (g/dL)   RDW 40.9  81.1 - 91.4 (%)   Platelets 231  150 - 400 (K/uL)  DIFFERENTIAL      Component Value Range   Neutrophils Relative 84 (*) 43 - 77 (%)   Neutro Abs 13.3 (*) 1.7 - 7.7 (K/uL)   Lymphocytes Relative 10 (*) 12 - 46 (%)   Lymphs Abs 1.5  0.7 - 4.0 (K/uL)   Monocytes Relative 7  3 - 12 (%)   Monocytes Absolute 1.1 (*) 0.1 - 1.0 (K/uL)   Eosinophils Relative 0  0 - 5 (%)   Eosinophils Absolute 0.0  0.0 - 0.7 (K/uL)   Basophils Relative 0  0 - 1 (%)   Basophils Absolute 0.0  0.0 - 0.1 (K/uL)  COMPREHENSIVE METABOLIC PANEL      Component Value Range   Sodium 133 (*) 135 - 145 (mEq/L)   Potassium 4.4  3.5 - 5.1 (mEq/L)   Chloride 98  96 - 112 (mEq/L)   CO2 25  19 - 32 (mEq/L)   Glucose, Bld  100 (*) 70 - 99 (mg/dL)   BUN 14  6 - 23 (mg/dL)   Creatinine, Ser  0.87  0.50 - 1.35 (mg/dL)   Calcium 9.4  8.4 - 16.1 (mg/dL)   Total Protein 7.1  6.0 - 8.3 (g/dL)   Albumin 3.7  3.5 - 5.2 (g/dL)   AST 27  0 - 37 (U/L)   ALT 9  0 - 53 (U/L)   Alkaline Phosphatase 94  39 - 117 (U/L)   Total Bilirubin 0.5  0.3 - 1.2 (mg/dL)   GFR calc non Af Amer 89 (*) >90 (mL/min)   GFR calc Af Amer >90  >90 (mL/min)  ETHANOL      Component Value Range   Alcohol, Ethyl (B) <11  0 - 11 (mg/dL)  VALPROIC ACID LEVEL      Component Value Range   Valproic Acid Lvl <10.0 (*) 50.0 - 100.0 (ug/mL)   Ct Head Wo Contrast  02/09/2011  *RADIOLOGY REPORT*  Clinical Data: Seizures.  CT HEAD WITHOUT CONTRAST  Technique:  Contiguous axial images were obtained from the base of the skull through the vertex without contrast.  Comparison: MRI brain 07/30/2010.  Findings: Extensive encephalomalacia is present in the posterior right frontal and parietal lobe, extending into the right temporal lobe, compatible with previous infarcts. The territory is not new relative to the prior studies.  There are remote infarcts of the right basal ganglia as well.  No acute infarct, hemorrhage, or mass lesion is present.  Mild ex vacuo dilation is present to in the antrum of the right lateral ventricle.  The ventricles are otherwise proportionate to the degree of atrophy.  No significant extra-axial fluid collection is present.  A prominent polyp or mucous retention cyst is present in the right maxillary sinus.  Chronic opacification of the left maxillary sinus is associated with a shrunken sinus.  The paranasal sinuses and mastoid air cells are otherwise clear.  The osseous skull is intact.  IMPRESSION:  1.  Extensive encephalomalacia of the right hemisphere, compatible with prior infarcts. 2.  Remote lacunar infarcts of the right basal ganglia. 3.  No acute intracranial abnormality. 4.  Chronic left maxillary sinus disease.  Original Report  Authenticated By: Jamesetta Orleans. MATTERN, M.D.   Dg Chest Port 1 View  02/09/2011  *RADIOLOGY REPORT*  Clinical Data: Weakness.  Seizure.  PORTABLE CHEST - 1 VIEW  Comparison: Portable chest 07/27/2010  Findings: The heart is mildly enlarged.  Chronic interstitial coarsening is stable.  No focal airspace disease is evident.  The visualized soft tissues and bony thorax are unremarkable.  IMPRESSION:  1.  Stable mild cardiomegaly without failure. 2.  Stable chronic interstitial coarsening. 3.  No acute cardiopulmonary disease.  Original Report Authenticated By: Jamesetta Orleans. MATTERN, M.D.      MDM  Patient with a seizure d/o who had a seizure while driving and one upon arrival in the ER for which he received ativan which stopped the seizure. Last seizure was 6 months ago.CT without acute findings. Labs with low depakote level. Given depakote. He has remained stable, alert, without further seizure activity.   I personally performed the services described in this documentation, which was scribed in my presence. The recorded information has been reviewed and considered.  CRITICAL CARE Performed by: Annamarie Dawley   Total critical care time:40  Critical care time was exclusive of separately billable procedures and treating other patients.  Critical care was necessary to treat or prevent imminent or life-threatening deterioration.  Critical care was time spent personally by me on the following activities: development of treatment plan with patient and/or  surrogate as well as nursing, discussions with consultants, evaluation of patient's response to treatment, examination of patient, obtaining history from patient or surrogate, ordering and performing treatments and interventions, ordering and review of laboratory studies, ordering and review of radiographic studies, pulse oximetry and re-evaluation of patient's condition.    Nicoletta Dress. Colon Branch, MD 02/09/11 [id number]

## 2011-02-09 NOTE — ED Notes (Signed)
Pt reports seizure while driving today, approx 1 hour ago. No loss of consciousness. No loss of control of kidneys, no vomiting.

## 2011-02-09 NOTE — ED Notes (Signed)
Received report from triage RN; agree with previous assessment. Pt sleepy but but awakens easily; answers questions; oriented; no seizure activity at this time noted; MD has assessed pt. Orders have been received will be carried out. Pt getting chest xray at this time

## 2011-02-09 NOTE — ED Notes (Signed)
Pt resting and comfortable; no seizure activity

## 2011-02-09 NOTE — ED Notes (Signed)
Pt transported to CT with RN; continues with no seizure activity noted; Family at bedside; Family looking for pts car keys; advised family to contact [employer]. Car has been secured, keys have been unaccounted for

## 2011-02-09 NOTE — ED Notes (Signed)
Pt unable to void at this time for urinalysis, will attempt again 45 min

## 2011-02-09 NOTE — ED Notes (Signed)
Pt having seizure, continues to talk but Sat dropping, limbs jerking, pt unable to sit up. Pt reports he feels like he is falling out of bed.

## 2011-02-09 NOTE — ED Notes (Signed)
No seizure activity Family at bedside

## 2011-02-09 NOTE — ED Notes (Signed)
Attempted to get urine; MD made aware; does not want to in and out at this time will attempt again in an hour

## 2011-02-09 NOTE — ED Notes (Signed)
Pt has weakness to Left side and is leaning to left side without support

## 2011-02-10 NOTE — ED Notes (Signed)
Pt stable at discharge No seizure activity

## 2011-03-14 ENCOUNTER — Ambulatory Visit (HOSPITAL_COMMUNITY): Payer: Medicare Other | Admitting: Psychiatry

## 2011-03-28 ENCOUNTER — Ambulatory Visit (HOSPITAL_COMMUNITY): Payer: Medicare Other | Admitting: Psychiatry

## 2011-07-09 ENCOUNTER — Encounter (HOSPITAL_COMMUNITY): Payer: Self-pay | Admitting: Psychiatry

## 2011-07-09 ENCOUNTER — Ambulatory Visit (INDEPENDENT_AMBULATORY_CARE_PROVIDER_SITE_OTHER): Payer: Self-pay | Admitting: Psychiatry

## 2011-07-09 VITALS — Wt 238.0 lb

## 2011-07-09 DIAGNOSIS — F331 Major depressive disorder, recurrent, moderate: Secondary | ICD-10-CM

## 2011-07-09 MED ORDER — PHENELZINE SULFATE 15 MG PO TABS: 30.0000 mg | ORAL_TABLET | Freq: Three times a day (TID) | ORAL | Status: DC

## 2011-07-09 NOTE — Progress Notes (Signed)
Chief complaint  I need to restart medication I was feeling depressed.  I need more refills on Nardil.    History of presenting illness Patient is 66 year old Caucasian male who was last seen in January 2013.  Patient admitted to stop taking his medication under the pressure from his brother .  He was told that antidepressant causing him seizures.  However he realizes he is feeling more depressed since he stopped taking Nardil.  Patient has another seizure in February and he was admitted in the hospital.  Patient brought list of medication however it is unclear if he is taking Keppra .  At the time of admission his Depakote level was less than 10.  Patient admitted taking all his medication but did not explained by his level was low.  He is also not taking Keppra .  As per list he was taking Abilify however it was never prescribed from this office.  He was getting Xanax and Abilify from his primary care physician .  I spoke to Dr. Ollen Bowl in he told that he has not giving Abilify to past one year.  It is unclear where he is getting Abilify from.  Patient recently stopped taking Nardil and feeling better.  He remembered being isolated withdrawn crying spells poor sleep and anhedonia but he was not taking Nardil.  He started to feel better .  He denies any side effects of Nardil.  He remembers very well dietary restriction with Nardil.  She does not want to change his antidepressant.  He is sleeping better.  He denies any hallucination or any paranoid thinking.  He is a poor historian and do not remember very well about his medication and dosage.  Denies any agitation anger or mood swing.  Past psychiatric history Patient has been seeing in this office since October 2009 when he was referred from his primary care physician.  Earlier he was seeing Dr. Redmond Baseman who had closed his practice .  His medication was managed by his primary care physician until he was referred to [place].  Patient has a long history of  depression.  He has at least 2 psychiatric inpatient treatment due to severe depression .  He was admitted in 1984 due to suicidal thoughts .  He was also consulted and [facility] by psychiatry liaison service when he was admitted for cerebral vascular accident.  In the past he had tried trazodone Cymbalta Prozac with poor response.  He is been stable on and why inhibitor for increase 17 years.    Current psychiatric medication Xanax 1 mg up to 4 times a day prescribed by primary care physician  Nardil 15 mg 2 tablet 3 times a day  Abilify 10 milligrams but it is unclear if he is taking.    Medical history Patient has history of hyperlipidemia, Spleenic infarction, hypertension, coronary artery disease, cerebrovascular accident, GERD, low back pain, seizure and back injury.  His primary care physician is Dr. Ollen Bowl.  He does not remember seeing a neurologist.    Family history Patient denies any family history of psychiatric illness.  Psychosocial history Patient has been married 3 times.  He has 4 children.  He has been disabled due to back injury.  He had a good support from his children.    Alcohol and substance use history Patient has a history of alcohol or substance use.    Mental status examination Patient is casually dressed and fairly groomed.  He is a poor historian and has  difficulty remembering things.  His his speech is slow and he has poverty of thought content.  He is easily distracted and has poor attention and concentration.  Denies any active or passive suicidal thoughts or homicidal thoughts.  He denies any auditory or visual hallucination.  There were no paranoia or delusion present at this time.  His thought process is slow but logical.  There were no flight of idea or loose association present at this time.  He's alert and oriented x3.  His insight judgment and pulse control is okay.  Assessment Axis I .  Maj. Depressive disorder  Axis II deferred Axis III  history of stroke, seizure disorder, back injury , hypertension , GERD, coronary artery disease and low-back pain.   Axis IV moderate Axis V 55-60  Plan I had a long discussion with the patient about his medication.  Patient do not remember very well about his medication and dosage.  I talk to his primary care physician Dr. Ollen Bowl who do not remember prescribing Abilify .  It is unclear why patient is not taking Keppra treated as per Dr. Ollen Bowl he was supposed to see Dr Lennie Hummer for his seizure,  However he has not seen a neurologist.  I recommend to contact his primary care physician for the management of seizure.  I also recommend to bring all his medication bottle on his next appointment.  If he is taking Abilify he need to bring the bottle.  At this time I will discontinue Abilify since he is not taking.  I will continue Nardil.  Patient is getting Xanax from his primary care physician.  I also encouraged him to keep his appointment to avoid relapse into his illness.  I also recommend to call us if his any question or concern about the medication or if he feel worsening of the symptoms.  Time spent 30 minutes.  I will see him again in 6 weeks.  I have provided written instruction to the patient .    Portion of this note is generated with voice recognition software and may contain typographical error.

## 2011-07-09 NOTE — Patient Instructions (Signed)
Please check with you her primary care physician for management of seizure medicine.  As per List you supposed to take Keppra however you are not taking.  You are taking only valproic acid for seizure.  You need to check if you require Keppra along with valproic acid.  You need to bring medication bottles on your next appointment .  I will discontinue Abilify since this had not prescribed by any physician. If you taking Abilify please bring the bottle for clarification, since pharmacy has no record.  I will see you again in 8 weeks .

## 2011-08-07 ENCOUNTER — Encounter (HOSPITAL_COMMUNITY): Payer: Self-pay | Admitting: Emergency Medicine

## 2011-08-07 ENCOUNTER — Inpatient Hospital Stay (HOSPITAL_COMMUNITY): Payer: Medicare Other

## 2011-08-07 ENCOUNTER — Emergency Department (HOSPITAL_COMMUNITY): Payer: Medicare Other

## 2011-08-07 ENCOUNTER — Inpatient Hospital Stay (HOSPITAL_COMMUNITY)
Admission: EM | Admit: 2011-08-07 | Discharge: 2011-08-20 | DRG: 208 | Disposition: A | Discharge status: Home Health | Payer: Medicare Other | Attending: General Surgery | Admitting: General Surgery

## 2011-08-07 DIAGNOSIS — J4489 Other specified chronic obstructive pulmonary disease: Secondary | ICD-10-CM | POA: Diagnosis present

## 2011-08-07 DIAGNOSIS — J9600 Acute respiratory failure, unspecified whether with hypoxia or hypercapnia: Secondary | ICD-10-CM | POA: Diagnosis not present

## 2011-08-07 DIAGNOSIS — I4891 Unspecified atrial fibrillation: Secondary | ICD-10-CM | POA: Diagnosis present

## 2011-08-07 DIAGNOSIS — J942 Hemothorax: Secondary | ICD-10-CM

## 2011-08-07 DIAGNOSIS — R0603 Acute respiratory distress: Secondary | ICD-10-CM

## 2011-08-07 DIAGNOSIS — I10 Essential (primary) hypertension: Secondary | ICD-10-CM

## 2011-08-07 DIAGNOSIS — R0989 Other specified symptoms and signs involving the circulatory and respiratory systems: Secondary | ICD-10-CM

## 2011-08-07 DIAGNOSIS — J9811 Atelectasis: Secondary | ICD-10-CM

## 2011-08-07 DIAGNOSIS — I824Z9 Acute embolism and thrombosis of unspecified deep veins of unspecified distal lower extremity: Secondary | ICD-10-CM | POA: Diagnosis present

## 2011-08-07 DIAGNOSIS — S2239XA Fracture of one rib, unspecified side, initial encounter for closed fracture: Secondary | ICD-10-CM

## 2011-08-07 DIAGNOSIS — E785 Hyperlipidemia, unspecified: Secondary | ICD-10-CM

## 2011-08-07 DIAGNOSIS — S2249XA Multiple fractures of ribs, unspecified side, initial encounter for closed fracture: Secondary | ICD-10-CM

## 2011-08-07 DIAGNOSIS — J449 Chronic obstructive pulmonary disease, unspecified: Secondary | ICD-10-CM

## 2011-08-07 DIAGNOSIS — R55 Syncope and collapse: Secondary | ICD-10-CM | POA: Diagnosis present

## 2011-08-07 DIAGNOSIS — G40909 Epilepsy, unspecified, not intractable, without status epilepticus: Secondary | ICD-10-CM | POA: Diagnosis present

## 2011-08-07 DIAGNOSIS — I82409 Acute embolism and thrombosis of unspecified deep veins of unspecified lower extremity: Secondary | ICD-10-CM | POA: Clinically undetermined

## 2011-08-07 DIAGNOSIS — W19XXXA Unspecified fall, initial encounter: Secondary | ICD-10-CM | POA: Diagnosis present

## 2011-08-07 DIAGNOSIS — J9819 Other pulmonary collapse: Secondary | ICD-10-CM | POA: Diagnosis present

## 2011-08-07 DIAGNOSIS — Z9889 Other specified postprocedural states: Secondary | ICD-10-CM

## 2011-08-07 DIAGNOSIS — R079 Chest pain, unspecified: Secondary | ICD-10-CM

## 2011-08-07 DIAGNOSIS — E876 Hypokalemia: Secondary | ICD-10-CM | POA: Diagnosis not present

## 2011-08-07 DIAGNOSIS — J90 Pleural effusion, not elsewhere classified: Secondary | ICD-10-CM | POA: Diagnosis present

## 2011-08-07 DIAGNOSIS — Z8673 Personal history of transient ischemic attack (TIA), and cerebral infarction without residual deficits: Secondary | ICD-10-CM

## 2011-08-07 DIAGNOSIS — F319 Bipolar disorder, unspecified: Secondary | ICD-10-CM | POA: Diagnosis present

## 2011-08-07 DIAGNOSIS — F17200 Nicotine dependence, unspecified, uncomplicated: Secondary | ICD-10-CM | POA: Diagnosis present

## 2011-08-07 DIAGNOSIS — I2510 Atherosclerotic heart disease of native coronary artery without angina pectoris: Secondary | ICD-10-CM

## 2011-08-07 LAB — COMPREHENSIVE METABOLIC PANEL
ALT: <5 U/L (ref 0–53)
AST: 13 U/L (ref 0–37)
Albumin: 3.7 g/dL (ref 3.5–5.2)
CO2: 28 mEq/L (ref 19–32)
Chloride: 99 mEq/L (ref 96–112)
GFR calc non Af Amer: 89 mL/min — ABNORMAL LOW (ref >90)
Sodium: 135 mEq/L (ref 135–145)
Total Bilirubin: 0.6 mg/dL (ref 0.3–1.2)

## 2011-08-07 LAB — URINALYSIS, ROUTINE W REFLEX MICROSCOPIC
Glucose, UA: NEGATIVE mg/dL
Hgb urine dipstick: NEGATIVE
Specific Gravity, Urine: 1.025 (ref 1.005–1.030)
Urobilinogen, UA: 1 mg/dL (ref 0.0–1.0)

## 2011-08-07 LAB — PROTIME-INR: Prothrombin Time: 12.8 seconds (ref 11.6–15.2)

## 2011-08-07 LAB — CBC
Hemoglobin: 16.2 g/dL (ref 13.0–17.0)
MCHC: 33.3 g/dL (ref 30.0–36.0)
RDW: 14.4 % (ref 11.5–15.5)
WBC: 14.4 10*3/uL — ABNORMAL HIGH (ref 4.0–10.5)

## 2011-08-07 LAB — CBC WITH DIFFERENTIAL/PLATELET
Basophils Absolute: 0 10*3/uL (ref 0.0–0.1)
Lymphocytes Relative: 14 % (ref 12–46)
Neutro Abs: 9 10*3/uL — ABNORMAL HIGH (ref 1.7–7.7)
Neutrophils Relative %: 78 % — ABNORMAL HIGH (ref 43–77)
Platelets: 260 10*3/uL (ref 150–400)
RDW: 14.3 % (ref 11.5–15.5)
WBC: 11.6 10*3/uL — ABNORMAL HIGH (ref 4.0–10.5)

## 2011-08-07 MED ORDER — PROPOFOL 10 MG/ML IV EMUL
5.0000 ug/kg/min | INTRAVENOUS | Status: DC
  Administered 2011-08-07: 50 ug/kg/min via INTRAVENOUS
  Filled 2011-08-07 (×2): qty 100

## 2011-08-07 MED ORDER — POTASSIUM CHLORIDE IN NACL 20-0.9 MEQ/L-% IV SOLN
INTRAVENOUS | Status: DC
  Administered 2011-08-07: 100 mL/h via INTRAVENOUS
  Administered 2011-08-08 – 2011-08-09 (×3): via INTRAVENOUS
  Administered 2011-08-10: 100 mL/h via INTRAVENOUS
  Administered 2011-08-11: 09:00:00 via INTRAVENOUS
  Filled 2011-08-07 (×14): qty 1000

## 2011-08-07 MED ORDER — ACETYLCYSTEINE 20 % IN SOLN
12.0000 mL | Freq: Once | RESPIRATORY_TRACT | Status: AC
  Administered 2011-08-07: 12 mL
  Filled 2011-08-07: qty 12

## 2011-08-07 MED ORDER — LIDOCAINE HCL (PF) 1 % IJ SOLN
INTRAMUSCULAR | Status: AC
  Filled 2011-08-07: qty 5

## 2011-08-07 MED ORDER — VALPROIC ACID 250 MG/5ML PO SYRP
500.0000 mg | ORAL_SOLUTION | Freq: Every day | ORAL | Status: DC
  Administered 2011-08-08 – 2011-08-12 (×5): 500 mg
  Filled 2011-08-07 (×5): qty 10

## 2011-08-07 MED ORDER — IOHEXOL 300 MG/ML  SOLN
100.0000 mL | Freq: Once | INTRAMUSCULAR | Status: AC | PRN
  Administered 2011-08-07: 100 mL via INTRAVENOUS

## 2011-08-07 MED ORDER — HYDROMORPHONE HCL PF 1 MG/ML IJ SOLN: 1.0000 mg | INTRAMUSCULAR | Status: DC | PRN

## 2011-08-07 MED ORDER — OXYCODONE HCL 5 MG PO TABS: 5.0000 mg | ORAL_TABLET | ORAL | Status: DC | PRN

## 2011-08-07 MED ORDER — OXYCODONE HCL 5 MG PO TABS: 2.5000 mg | ORAL_TABLET | ORAL | Status: DC | PRN

## 2011-08-07 MED ORDER — ALBUTEROL SULFATE (5 MG/ML) 0.5% IN NEBU
2.5000 mg | INHALATION_SOLUTION | RESPIRATORY_TRACT | Status: DC
  Administered 2011-08-07 – 2011-08-18 (×57): 2.5 mg via RESPIRATORY_TRACT
  Filled 2011-08-07 (×60): qty 0.5

## 2011-08-07 MED ORDER — OXYCODONE HCL 5 MG PO TABS: 10.0000 mg | ORAL_TABLET | ORAL | Status: DC | PRN

## 2011-08-07 MED ORDER — ALBUTEROL SULFATE (5 MG/ML) 0.5% IN NEBU
2.5000 mg | INHALATION_SOLUTION | Freq: Once | RESPIRATORY_TRACT | Status: AC
  Administered 2011-08-07: 2.5 mg via RESPIRATORY_TRACT
  Filled 2011-08-07: qty 0.5

## 2011-08-07 MED ORDER — SODIUM CHLORIDE 0.9 % IV SOLN
Freq: Once | INTRAVENOUS | Status: AC
  Administered 2011-08-07: 15:00:00 via INTRAVENOUS

## 2011-08-07 MED ORDER — ONDANSETRON HCL 4 MG PO TABS: 4.0000 mg | ORAL_TABLET | Freq: Four times a day (QID) | ORAL | Status: DC | PRN

## 2011-08-07 MED ORDER — FENTANYL CITRATE 0.05 MG/ML IJ SOLN
50.0000 ug | Freq: Once | INTRAMUSCULAR | Status: AC
  Administered 2011-08-07: 50 ug via INTRAVENOUS

## 2011-08-07 MED ORDER — MORPHINE SULFATE 4 MG/ML IJ SOLN
INTRAMUSCULAR | Status: AC
  Administered 2011-08-07: 4 mg
  Filled 2011-08-07: qty 1

## 2011-08-07 MED ORDER — AMLODIPINE BESYLATE 5 MG PO TABS
5.0000 mg | ORAL_TABLET | Freq: Every day | ORAL | Status: DC
  Filled 2011-08-07: qty 1

## 2011-08-07 MED ORDER — KETOROLAC TROMETHAMINE 30 MG/ML IJ SOLN
30.0000 mg | Freq: Once | INTRAMUSCULAR | Status: AC
  Administered 2011-08-07: 30 mg via INTRAVENOUS
  Filled 2011-08-07: qty 1

## 2011-08-07 MED ORDER — HYDROMORPHONE HCL PF 1 MG/ML IJ SOLN: 0.5000 mg | INTRAMUSCULAR | Status: DC | PRN

## 2011-08-07 MED ORDER — SODIUM CHLORIDE 0.9 % IV SOLN
1000.0000 mg | Freq: Three times a day (TID) | INTRAVENOUS | Status: DC
  Administered 2011-08-07 – 2011-08-13 (×17): 1000 mg via INTRAVENOUS
  Filled 2011-08-07 (×19): qty 10

## 2011-08-07 MED ORDER — PROPOFOL 10 MG/ML IV EMUL
INTRAVENOUS | Status: AC
  Filled 2011-08-07: qty 100

## 2011-08-07 MED ORDER — MORPHINE SULFATE 4 MG/ML IJ SOLN
4.0000 mg | INTRAMUSCULAR | Status: DC | PRN
  Administered 2011-08-09: 4 mg via INTRAVENOUS
  Filled 2011-08-07: qty 1

## 2011-08-07 MED ORDER — ONDANSETRON HCL 4 MG/2ML IJ SOLN: 4.0000 mg | Freq: Four times a day (QID) | INTRAMUSCULAR | Status: DC | PRN

## 2011-08-07 MED ORDER — PANTOPRAZOLE SODIUM 40 MG PO TBEC: 40.0000 mg | DELAYED_RELEASE_TABLET | Freq: Every day | ORAL | Status: DC

## 2011-08-07 MED ORDER — IPRATROPIUM BROMIDE 0.02 % IN SOLN
0.5000 mg | Freq: Once | RESPIRATORY_TRACT | Status: AC
  Administered 2011-08-07: 0.5 mg via RESPIRATORY_TRACT
  Filled 2011-08-07: qty 2.5

## 2011-08-07 MED ORDER — IPRATROPIUM BROMIDE 0.02 % IN SOLN
0.5000 mg | RESPIRATORY_TRACT | Status: DC
  Administered 2011-08-07 – 2011-08-18 (×57): 0.5 mg via RESPIRATORY_TRACT
  Filled 2011-08-07 (×60): qty 2.5

## 2011-08-07 MED ORDER — FENTANYL CITRATE 0.05 MG/ML IJ SOLN
INTRAMUSCULAR | Status: AC
  Administered 2011-08-07: 50 ug via INTRAVENOUS
  Filled 2011-08-07: qty 2

## 2011-08-07 MED ORDER — PANTOPRAZOLE SODIUM 40 MG IV SOLR
40.0000 mg | Freq: Every day | INTRAVENOUS | Status: DC
  Administered 2011-08-08 – 2011-08-10 (×3): 40 mg via INTRAVENOUS
  Filled 2011-08-07 (×4): qty 40

## 2011-08-07 MED ORDER — ACETYLCYSTEINE 20 % IN SOLN
2.0000 mL | RESPIRATORY_TRACT | Status: DC
  Administered 2011-08-08 – 2011-08-12 (×29): 2 mL via RESPIRATORY_TRACT
  Filled 2011-08-07 (×40): qty 4

## 2011-08-07 NOTE — ED Notes (Signed)
Dr. Blackman at bedside. 

## 2011-08-07 NOTE — ED Notes (Signed)
Return from CT scan.

## 2011-08-07 NOTE — ED Notes (Signed)
Pt denies pain at this time. Skin warm dry. Dr Rayburn Ma remains at bedside. Daughter informed of plan

## 2011-08-07 NOTE — ED Notes (Signed)
Procedure complete chest tube sutured and Vaseline gauze dressing per Dr Leary Roca. Pt tolerated well. Alertx4 respirations easy non labored

## 2011-08-07 NOTE — Procedures (Signed)
Name: [name] MRN: [id number] DOB: 04/22/1945  DOS:   PROCEDURE NOTE  Procedure:  Endotracheal intubation.  Indication:  Acute respiratory failure  Consent:  Consent was obtained from daughter (medical POA)  Anesthesia:  A total of 10 mg of Etomidate was given intravenously.  Procedure summary:  Appropriate equipment was assembled. The patient was identified as Jeffrey Sweeney and safety timeout was performed. The patient was placed supine, with head in sniffing position. After adequate level of anesthesia was achieved, a GS#3 blade was inserted into the oropharynx and the vocal cords were visualized. A 8.0 endotracheal tube was inserted without difficulty and visualized going through the vocal cords. The stylette was removed and cuff inflated. Colorimetric change was noted on the CO2 meter. Breath sounds were heard over both lung fields equally. Post procedure chest xray was ordered.  Complications:  No immediate complications were noted.  Hemodynamic parameters and oxygenation remained stable throughout the procedure.    Orlean Bradford, M.D. Pulmonary and Critical Care Medicine [facility] Cell: [phone] Pager: [phone]  08/07/2011, 11:25 PM

## 2011-08-07 NOTE — ED Provider Notes (Addendum)
History   This chart was scribed for Benny Lennert, MD by Charolett Bumpers . The patient was seen in room APA07/APA07. Patient's care was started at 1419.    CSN: [id number]  Arrival date & time 08/07/11  1417   First MD Initiated Contact with Patient 08/07/11 1419      Chief Complaint  Patient presents with  . Fall  . Flank Pain    (Consider location/radiation/quality/duration/timing/severity/associated sxs/prior treatment) HPI Comments: Jeffrey Sweeney is a 66 y.o. male who presents to the Emergency Department complaining of constant, moderate left rib pain after a fall that occurred yesterday after a syncope episode, landing on his left side on a toilet in the bathroom. Pt states that he does not use O2 at home. Pt has a h/o syncopal episodes. Pt denies any other injuries or complaints of pain at this time. Pt denies smoking.  Patient is a 66 y.o. male presenting with fall. The history is provided by the patient.  Fall The accident occurred yesterday. The fall occurred while walking. Pain location: Left ribs. The pain is moderate. Pertinent negatives include no abdominal pain, no hematuria and no headaches. He has tried nothing for the symptoms.   PCP: Dr. Juanetta Gosling  Past Medical History  Diagnosis Date  . Heart disease   . CAD (coronary artery disease)   . Stroke     left arm weakness  . Seizures   . Depression     Past Surgical History  Procedure Date  . Ankle fracture surgery     Family History  Problem Relation Age of Onset  . Cancer Mother   . Cancer Father     History  Substance Use Topics  . Smoking status: Current Everyday Smoker -- 0.5 packs/day for 50 years    Types: Cigarettes  . Smokeless tobacco: Never Used  . Alcohol Use: No      Review of Systems  Constitutional: Negative for fatigue.  HENT: Negative for congestion, sinus pressure and ear discharge.   Eyes: Negative for discharge.  Respiratory: Negative for cough.   Cardiovascular:  Positive for chest pain.       Left rib pain.   Gastrointestinal: Negative for abdominal pain and diarrhea.  Genitourinary: Negative for frequency and hematuria.  Musculoskeletal: Negative for back pain.  Skin: Negative for rash.  Neurological: Negative for seizures and headaches.  Hematological: Negative.   Psychiatric/Behavioral: Negative for hallucinations.  All other systems reviewed and are negative.    Allergies  Meperidine hcl  Home Medications   Current Outpatient Rx  Name Route Sig Dispense Refill  . ALPRAZOLAM 1 MG PO TABS Oral Take 1 mg by mouth 4 (four) times daily.     Marland Kitchen AMLODIPINE BESYLATE 5 MG PO TABS Oral Take 1 tablet (5 mg total) by mouth daily. 30 tablet 12  . ATORVASTATIN CALCIUM 40 MG PO TABS Oral Take 40 mg by mouth daily.      Marland Kitchen HYDROCODONE-ACETAMINOPHEN 7.5-500 MG PO TABS Oral Take 1 tablet by mouth 4 (four) times daily.    Marland Kitchen LEVETIRACETAM 1000 MG PO TABS Oral Take 1 tablet (1,000 mg total) by mouth 3 (three) times daily. 90 tablet 5  . NITROGLYCERIN 0.4 MG SL SUBL Sublingual Place 0.4 mg under the tongue every 5 (five) minutes as needed. Chest pain    . PANTOPRAZOLE SODIUM 40 MG PO TBEC Oral Take 40 mg by mouth daily.      Marland Kitchen PHENELZINE SULFATE 15 MG PO TABS Oral Take  2 tablets (30 mg total) by mouth 3 (three) times daily. 180 tablet 1  . POTASSIUM CHLORIDE CRYS ER 20 MEQ PO TBCR Oral Take 1 tablet (20 mEq total) by mouth 2 (two) times daily. 60 tablet 12  . VALPROIC ACID 250 MG/5ML PO SYRP Oral Take 250 mg by mouth 2 (two) times daily.        BP 112/76  Pulse 92  Resp 24  Ht 5\' 8"  (1.727 m)  Wt 231 lb (104.781 kg)  BMI 35.12 kg/m2  SpO2 84%  Physical Exam  Constitutional: He is oriented to person, place, and time. He appears well-developed.  HENT:  Head: Normocephalic and atraumatic.  Eyes: Conjunctivae and EOM are normal. No scleral icterus.  Neck: Neck supple. No thyromegaly present.  Cardiovascular: Normal rate and regular rhythm.  Exam  reveals no gallop and no friction rub.   No murmur heard. Pulmonary/Chest: No stridor. He has wheezes. He has no rales. He exhibits tenderness.       Minimal wheezes bilaterally. Tenderness to left lateral ribs.   Abdominal: He exhibits no distension. There is tenderness. There is no rebound.       Left flank tenderness.   Musculoskeletal: Normal range of motion. He exhibits no edema.  Lymphadenopathy:    He has no cervical adenopathy.  Neurological: He is oriented to person, place, and time. Coordination normal.  Skin: Bruising noted. No rash noted. No erythema.       Bruising to right anterior chest.   Psychiatric: He has a normal mood and affect. His behavior is normal.    ED Course  Procedures (including critical care time)  DIAGNOSTIC STUDIES: Oxygen Saturation is 84% on [employer], low by my interpretation.    COORDINATION OF CARE:  14:26-Discussed planned course of treatment with the patient, who is agreeable at this time.   14:35-Recheck: Upon re-examination of the pt, pt has some left flank tenderness and states that he fell during a syncopal episode.   14:45-Medication Orders 0.9% sodium chloride infusion-once; Ipratropium (Atrovent) nebulizer solution 0.5 mg-once; Albuterol (Proventil) (5 mg/mL) 0.5% nebulizer solution 2.5 mg-once  Labs Reviewed - No data to display No results found.   No diagnosis found.  CRITICAL CARE Performed by: Shantal Roan L   Total critical care time: 40  Critical care time was exclusive of separately billable procedures and treating other patients.  Critical care was necessary to treat or prevent imminent or life-threatening deterioration.  Critical care was time spent personally by me on the following activities: development of treatment plan with patient and/or surrogate as well as nursing, discussions with consultants, evaluation of patient's response to treatment, examination of patient, obtaining history from patient or surrogate,  ordering and performing treatments and interventions, ordering and review of laboratory studies, ordering and review of radiographic studies, pulse oximetry and re-evaluation of patient's condition.  Dr. Magnus Ivan trauma surgeon to see pt in the er at [facility]   The chart was scribed for me under my direct supervision.  I personally performed the history, physical, and medical decision making and all procedures in the evaluation of this patient..   Date: 08/07/2011  Rate:75  Rhythm: normal sinus rhythm  QRS Axis: normal  Intervals: normal  ST/T Wave abnormalities: normal  Conduction Disutrbances:none  Narrative Interpretation:   Old EKG Reviewed: none available     Benny Lennert, MD 08/07/11 1901  Benny Lennert, MD 08/07/11 1901

## 2011-08-07 NOTE — ED Notes (Signed)
Patient is s/p fall in shower. Reported to [facility] for evaluation. Was found to have a possible left hemothorax and left rib fx 7-11. Was transferred to [facility] from [facility] via [facility] for further evaluation and treatment. Patient is AAOx4. With decreased breath sounds on the left. Arrived on 15L NRB with O2 sats in the low 90s. Last vitals per carelink were BP 118/72, HR 100. Received Morphine 4 mg en route.

## 2011-08-07 NOTE — ED Notes (Signed)
Patient transported to CT with nurse and Dr Rayburn Ma at bedside

## 2011-08-07 NOTE — ED Notes (Signed)
Pt requesting pain meds. MD aware.

## 2011-08-07 NOTE — Consult Note (Signed)
Name: Jeffrey Sweeney MRN: [id number] DOB: 03/17/1945    LOS: 0  Referring Provider:  Magnus Ivan (Trauma) Reason for Referral:  Left lung collapse  PULMONARY / CRITICAL CARE MEDICINE  HPI:  29 active smoker with history of CVA and seizure disorder (reportedly not compliant with medications) who fell at home on 7/30 and landed on his left chest.  He does not remember tripping and there was no apparent seizure activity.  He presented to [facility] on 7/31 with left sided chest pain and was found to have multiple rib fractures.  He was transferred to [facility] to be admitted to trauma service.  Upon arrival he was noted to be in respiratory distress and CXR revealed left chest opacification.  Hemothorax was suspected and chest tube was placed emergently by EDP with only mild amount of pneumothorax relieved.  PCCM was consulted for possible bronchoscopy.  Past Medical History  Diagnosis Date  . Heart disease   . CAD (coronary artery disease)   . Stroke     left arm weakness  . Seizures   . Depression    Past Surgical History  Procedure Date  . Ankle fracture surgery    Prior to Admission medications   Medication Sig Start Date End Date Taking? Authorizing Provider  albuterol (PROAIR HFA) 108 (90 BASE) MCG/ACT inhaler Inhale 2 puffs into the lungs 4 (four) times daily.   Yes Historical Provider, MD  ALPRAZolam Prudy Feeler) 1 MG tablet Take 1 mg by mouth 3 (three) times daily as needed. For anxiety   Yes Historical Provider, MD  amLODipine (NORVASC) 5 MG tablet Take 5 mg by mouth daily.   Yes Historical Provider, MD  atorvastatin (LIPITOR) 40 MG tablet Take 40 mg by mouth at bedtime.    Yes Historical Provider, MD  Eszopiclone (ESZOPICLONE) 3 MG TABS Take 3 mg by mouth at bedtime. Take immediately before bedtime   Yes Historical Provider, MD  HYDROcodone-acetaminophen (NORCO) 7.5-325 MG per tablet Take 1 tablet by mouth 4 (four) times daily.   Yes Historical Provider, MD  levETIRAcetam (KEPPRA) 1000 MG tablet  Take 1,000 mg by mouth 3 (three) times daily.   Yes Historical Provider, MD  nitroGLYCERIN (NITROSTAT) 0.4 MG SL tablet Place 0.4 mg under the tongue every 5 (five) minutes as needed. Chest pain   Yes Historical Provider, MD  pantoprazole (PROTONIX) 40 MG tablet Take 40 mg by mouth daily.     Yes Historical Provider, MD  phenelzine (NARDIL) 15 MG tablet Take 2 tablets (30 mg total) by mouth 3 (three) times daily. 07/09/11  Yes Cleotis Nipper, MD  potassium chloride 20 MEQ/15ML (10%) SOLN Take 20 mEq by mouth daily.   Yes Historical Provider, MD  Valproic Acid (DEPAKENE) 250 MG/5ML SYRP syrup Take 500 mg by mouth daily.    Yes Historical Provider, MD   Allergies Allergies  Allergen Reactions  . Meperidine Hcl    Family History Family History  Problem Relation Age of Onset  . Cancer Mother   . Cancer Father    Social History  reports that he has been smoking Cigarettes.  He has a 25 pack-year smoking history. He has never used smokeless tobacco. He reports that he does not drink alcohol or use illicit drugs.  Review Of Systems:  As per HPI, otherwise negative  Brief patient description:  74 active smoker with history of CVA and seizure disorder (reportedly not compliant with medications) who fell at home on 7/30 and landed on his left chest.  He does not remember tripping and there was no apparent seizure activity.  He presented to [facility] on 7/31 with left sided chest pain and was found to have multiple rib fractures.  He was transferred to [facility] to be admitted to trauma service.  Upon arrival he was noted to be in respiratory distress and CXR revealed left chest opacification.  Hemothorax was suspected and chest tube was placed emergently by EDP with only mild amount of pneumothorax relieved.  PCCM was consulted for possible bronchoscopy.  Events Since Admission: 7/30  Larey Seat on left chest 7/31  Presented to [facility]. Rib fractures.  Transferred to [facility].  Opacification of left chest.  Left chest tube with  minimal hemathorax.  Chest CT pending.   Current Status:  Vital Signs: Temp:  [98.3 F (36.8 C)] 98.3 F (36.8 C) (07/31 1437) Pulse Rate:  [87-98] 94  (07/31 2030) Resp:  [20-28] 28  (07/31 1858) BP: (112-135)/(69-78) 135/75 mmHg (07/31 2030) SpO2:  [74 %-96 %] 95 % (07/31 2030) Weight:  [104.781 kg (231 lb)] 104.781 kg (231 lb) (07/31 1412)  Physical Examination: General:  Poorly kept, uncomfortable, some distress Neuro:  Awake, alert, cooperative but poor historian HEENT:  PERRL Neck:  Neck collar on, no pain to palpation Cardiovascular:  RRR, no murmurs Lungs:  Diminished air entry on the right, tenderness to palpation of the right chest, right chest tube in place with small amount of blood in the suction chamber, no overt air leak Abdomen:  Soft, non tender, bowel sounds present Musculoskeletal:  Moves all extremities Skin:  Intact   Active Problems:  SYNCOPE  Seizure disorder  Rib fractures  Respiratory distress  COPD (chronic obstructive pulmonary disease)  Atelectasis  H/O chest tube placement  Hemothorax  ASSESSMENT AND PLAN  PULMONARY No results found for this basename: PHART:5,PCO2:5,PCO2ART:5,PO2ART:5,HCO3:5,O2SAT:5 in the last 168 hours  Ventilator Settings:   CXR:  7/31 >>> Near complete opacification of left hemithorax.  ETT:  NA  A:  Left chest trauma.  Atelectasis / hemothorax on the left.  Suspected mucous plug / endobronchial lesion.  S/p chest tube placement.  Suspected COPD / active bronchospasm.  Hypoxia.  P:   Limited value of chest physical therapy for atelectasis (otherwise preferred treatment modality ) due to multiple rib fractures Chest CT Flexible bronchoscopy / lavage May need intubation for procedure given baseline hypoxia and suspected underline COPD Bronchodilators / Mucomyst Incentive spirometry Supplemental oxygen to keep SpO2>92% Pain control Surgery following  CARDIOVASCULAR  Lab 08/07/11 1443  TROPONINI <0.30    LATICACIDVEN --  PROBNP --   ECG:  NA Lines: NA  A: Hemodynamically stable. History of CAD.  Dyslipidemia.  Hypertension.  P:  12 lead ECG Lipitor, Norvasc as preadmission  RENAL  Lab 08/07/11 1440  NA 135  K 4.4  CL 99  CO2 28  BUN 9  CREATININE 0.87  CALCIUM 9.4  MG --  PHOS --   Intake/Output      07/31 0701 - 08/01 0700   Chest Tube 40   Total Output 40   Net -40        Foley:  NA  A:  No active issue P:   IVF  GASTROINTESTINAL  Lab 08/07/11 1440  AST 13  ALT <5  ALKPHOS 106  BILITOT 0.6  PROT 7.0  ALBUMIN 3.7   A:  No active issues. P:   NPO for bronchoscopy  HEMATOLOGIC  Lab 08/07/11 1440  HGB 17.2*  HCT 50.6  PLT  260  INR 0.94  APTT --   A:  Polycythemia, ? secondary to smoking / chronic chronic hypoxia. P:  Trend CBC  INFECTIOUS  Lab 08/07/11 1440  WBC 11.6*  PROCALCITON --   Cultures: NA Antibiotics: NA  A:  No evidence of acute infection. P:   No intervention required  ENDOCRINE No results found for this basename: GLUCAP:5 in the last 168 hours A:  No active issues. P:   No intervention required  NEUROLOGIC  Head CT 7/31 >>> No hemorrhage.  Old right hemispheric infarct. Neck CT 7/31 >>>  A:  Syncope, etiology is not clear.  History of CVA.  History of seizure disorder, possibly noncompliant with treatment. P:   Valproic acid level Hold Lunesta / Xanax Keppra / Depakote as preadmission Neck collar Follow neck CT  BEST PRACTICE / DISPOSITION Level of Care:  ICU Primary Service:  Trauma Consultants:  PCCM Code Status:  Full Diet:  NPO DVT Px:  SCDs GI Px:  Not indicated (Protonix preadmission) Skin Integrity:  Intact Social / Family:  Daughter updated at bedside  Lonia Farber, M.D. Pulmonary and Critical Care Medicine [facility] Pager: [phone]  08/07/2011, 9:12 PM

## 2011-08-07 NOTE — ED Notes (Signed)
[place] stated for carelink to give 4 mg of morphine iv

## 2011-08-07 NOTE — ED Provider Notes (Signed)
History     CSN: [id number]  Arrival date & time 08/07/11  1417   First MD Initiated Contact with Patient 08/07/11 1419      Chief Complaint  Patient presents with  . Fall    (Consider location/radiation/quality/duration/timing/severity/associated sxs/prior treatment) Patient is a 66 y.o. male presenting with fall. The history is provided by the patient and medical records.  Fall The accident occurred yesterday. The fall occurred in unknown circumstances. Distance fallen: from standing. Impact surface: tub. There was no blood loss. Point of impact: chest. Pain location: chest. The pain is at a severity of 2/10. He was ambulatory at the scene. Pertinent negatives include no visual change, no fever, no numbness, no abdominal pain, no nausea, no vomiting, no headaches and no loss of consciousness. Exacerbated by: breathing.    Past Medical History  Diagnosis Date  . Heart disease   . CAD (coronary artery disease)   . Stroke     left arm weakness  . Seizures   . Depression     Past Surgical History  Procedure Date  . Ankle fracture surgery     Family History  Problem Relation Age of Onset  . Cancer Mother   . Cancer Father     History  Substance Use Topics  . Smoking status: Current Everyday Smoker -- 0.5 packs/day for 50 years    Types: Cigarettes  . Smokeless tobacco: Never Used  . Alcohol Use: No      Review of Systems  Constitutional: Negative for fever.  HENT: Negative for neck pain.   Eyes: Negative for visual disturbance.  Respiratory: Positive for shortness of breath.   Cardiovascular: Positive for chest pain.  Gastrointestinal: Negative for nausea, vomiting and abdominal pain.  Musculoskeletal: Negative for back pain.  Neurological: Negative for loss of consciousness, weakness, numbness and headaches.  Psychiatric/Behavioral: Negative for confusion.  All other systems reviewed and are negative.    Allergies  Meperidine hcl  Home Medications    Current Outpatient Rx  Name Route Sig Dispense Refill  . ALBUTEROL SULFATE HFA 108 (90 BASE) MCG/ACT IN AERS Inhalation Inhale 2 puffs into the lungs 4 (four) times daily.    Marland Kitchen ALPRAZOLAM 1 MG PO TABS Oral Take 1 mg by mouth 3 (three) times daily as needed. For anxiety    . AMLODIPINE BESYLATE 5 MG PO TABS Oral Take 5 mg by mouth daily.    . ATORVASTATIN CALCIUM 40 MG PO TABS Oral Take 40 mg by mouth at bedtime.     Marland Kitchen ESZOPICLONE 3 MG PO TABS Oral Take 3 mg by mouth at bedtime. Take immediately before bedtime    . HYDROCODONE-ACETAMINOPHEN 7.5-325 MG PO TABS Oral Take 1 tablet by mouth 4 (four) times daily.    Marland Kitchen LEVETIRACETAM 1000 MG PO TABS Oral Take 1,000 mg by mouth 3 (three) times daily.    Marland Kitchen NITROGLYCERIN 0.4 MG SL SUBL Sublingual Place 0.4 mg under the tongue every 5 (five) minutes as needed. Chest pain    . PANTOPRAZOLE SODIUM 40 MG PO TBEC Oral Take 40 mg by mouth daily.      Marland Kitchen PHENELZINE SULFATE 15 MG PO TABS Oral Take 2 tablets (30 mg total) by mouth 3 (three) times daily. 180 tablet 1  . POTASSIUM CHLORIDE 20 MEQ/15ML (10%) PO SOLN Oral Take 20 mEq by mouth daily.    Marland Kitchen VALPROIC ACID 250 MG/5ML PO SYRP Oral Take 500 mg by mouth daily.       BP 135/75  Pulse 94  Temp 98.3 F (36.8 C) (Oral)  Resp 28  Ht 5\' 8"  (1.727 m)  Wt 231 lb (104.781 kg)  BMI 35.12 kg/m2  SpO2 95%  Physical Exam  Nursing note and vitals reviewed. Constitutional: He is oriented to person, place, and time. He appears well-developed and well-nourished.  HENT:  Head: Normocephalic and atraumatic.  Eyes: EOM are normal. Pupils are equal, round, and reactive to light.  Neck: No spinous process tenderness present.  Cardiovascular: Normal rate and regular rhythm.   Pulmonary/Chest: Effort normal. No respiratory distress. He exhibits tenderness. He exhibits no crepitus.         Mildly decreased breath sounds bilaterally; ecchymoses to L lower chest wall  Abdominal: Soft. He exhibits no distension. There  is no tenderness.  Musculoskeletal: Normal range of motion. He exhibits no tenderness.  Neurological: He is alert and oriented to person, place, and time.  Skin: Skin is warm and dry.  Psychiatric: He has a normal mood and affect.    ED Course  CHEST TUBE INSERTION Performed by: Theotis Burrow Authorized by: Cheri Guppy Consent: Verbal consent obtained. Written consent not obtained. The procedure was performed in an emergent situation. Risks and benefits: risks, benefits and alternatives were discussed Consent given by: patient Patient understanding: patient states understanding of the procedure being performed Imaging studies: imaging studies available Patient identity confirmed: verbally with patient Time out: Immediately prior to procedure a "time out" was called to verify the correct patient, procedure, equipment, support staff and site/side marked as required. Indications: hemothorax Patient sedated: no Anesthesia: local infiltration Local anesthetic: lidocaine 1% without epinephrine Anesthetic total: 15 ml Preparation: skin prepped with Betadine Placement location: left lateral Scalpel size: 11 Tube size: 40 [place] Dissection instrument: finger and Kelly clamp Ultrasound guidance: no Tension pneumothorax heard: no Tube connected to: suction Drainage characteristics: bloody Drainage amount: 35 ml Suture material: 0 silk Dressing: Xeroform gauze Post-insertion x-ray findings: tube in good position Patient tolerance: Patient tolerated the procedure well with no immediate complications.   (including critical care time)  Labs Reviewed  CBC WITH DIFFERENTIAL - Abnormal; Notable for the following:    WBC 11.6 (*)     Hemoglobin 17.2 (*)     Neutrophils Relative 78 (*)     Neutro Abs 9.0 (*)     All other components within normal limits  COMPREHENSIVE METABOLIC PANEL - Abnormal; Notable for the following:    Glucose, Bld 109 (*)     GFR calc non Af Amer  89 (*)     All other components within normal limits  URINALYSIS, ROUTINE W REFLEX MICROSCOPIC - Abnormal; Notable for the following:    Bilirubin Urine SMALL (*)     All other components within normal limits  TROPONIN I  PROTIME-INR  CBC   Dg Ribs Unilateral W/chest Left  08/07/2011  *RADIOLOGY REPORT*  Clinical Data: Fall today.  Left rib pain.  Flank pain.  LEFT RIBS AND CHEST - 3+ VIEW  Comparison: Portable chest 02/09/2011.  Findings: Heart is mildly enlarged.  A prominent left pleural effusion is present.  Multiple left-sided rib fractures involve the 7th through 11th ribs laterally and anteriorly.  There is no definite pneumothorax.  Limited imaging of the upper abdomen demonstrates contrast within the left renal collecting system secondary to the patient's CT scan.  IMPRESSION:  1.  Left-sided 7th through 11th rib fractures. 2.  Left pleural effusion and airspace disease likely reflects a hemothorax and atelectasis or pulmonary contusion.  Original Report Authenticated By: Jamesetta Orleans. MATTERN, M.D.   Ct Head Wo Contrast  08/07/2011  *RADIOLOGY REPORT*  Clinical Data: Fall with history of stroke affecting left arm.  CT HEAD WITHOUT CONTRAST  Technique:  Contiguous axial images were obtained from the base of the skull through the vertex without contrast.  Comparison: 02/09/2011  Findings: Bone windows demonstrate no significant soft tissue swelling.  Mucous retention cyst or polyp in the right maxillary sinus.  There are also more cephalad secretions in this area. Persistent ethmoid air cell mucosal thickening.  Clear mastoid air cells.  Soft tissue windows demonstrate redemonstration of encephalomalacia involving the right parietal and occipital lobes.  Resultant ex vacuo dilatation of the right lateral ventricle.  Mild low density in the periventricular white matter likely related to small vessel disease.Mild extension of encephalomalacia in the right temporal lobe which is unchanged.  No   mass lesion, hemorrhage, hydrocephalus, acute infarct, intra- axial, or extra-axial fluid collection.  IMPRESSION:  1.  No acute or post-traumatic deformity identified. 2.  Remote right-sided hemispheric infarct. 3.  Mild small vessel ischemic change. 4.  Sinus disease.  Original Report Authenticated By: Consuello Bossier, M.D.   Ct Abdomen Pelvis W Contrast  08/07/2011  *RADIOLOGY REPORT*  Clinical Data: status post fall.  CT ABDOMEN AND PELVIS WITH CONTRAST  Technique:  Multidetector CT imaging of the abdomen and pelvis was performed following the standard protocol during bolus administration of intravenous contrast.  Contrast: OMNIPAQUE IOHEXOL 300 MG/ML  SOLN  Comparison: 02/18/2010  Findings: Atelectasis and consolidation involving the left lung with associated left lung volume loss is identified.  This is a new finding since 02/08/2010.  No pericardial or pleural effusion identified.  No focal liver abnormality.  The gallbladder is normal.  No biliary dilatation.  The pancreas is unremarkable.  Similar appearance of the spleen with deformity of the anterior portion of the splenic parenchyma.  Query prior infarct or trauma.  Both adrenal glands are normal.  Bilateral renal cysts are noted. There is a nonobstructing calculi within the upper pole the right kidney measuring 2-3 mm, image 41.  Unchanged from previous exam. The urinary bladder appears normal.  There is no adenopathy within the upper abdomen.  No pelvic or inguinal adenopathy identified.  There is no free fluid or fluid collections within the abdomen or pelvis.  Review of the visualized bony structures is significant for acute left 8th through 12th rib fracture deformities.  No right rib fracture deformities.  There is a curvature of the thoracic and lumbar spine which is convex to the right.  Mild multilevel degenerative disc disease is noted.  The vertebral body heights are well maintained.  IMPRESSION:  1.  Multiple acute left sided rib  fractures. 2.  Atelectasis and consolidation involving the left midlung and left base.  Cannot rule out pulmonary contusion, pneumonia or aspiration.  There is associated volume loss of the left hemithorax.  Suggest further evaluation with CT of the chest 3.  No acute findings within the abdomen or pelvis.  Original Report Authenticated By: Rosealee Albee, M.D.   Dg Chest Portable 1 View  08/07/2011  *RADIOLOGY REPORT*  Clinical Data: Fall.  Short of breath.  Pneumonia.  PORTABLE CHEST - 1 VIEW  Comparison: 08/07/2011.  Findings: Worsening left-sided pulmonary aeration suggesting increasing pleural fluid and in the setting of trauma increasing left hemothorax.  Increasing atelectasis in the right lung.  There is probably also superimposed interstitial pulmonary edema.  The cardiopericardial silhouette is partially obscured.  IMPRESSION: Increasing opacification of the left hemithorax, compatible with increasing hemothorax in the setting of trauma.  Only a small portion of the left upper lobe remains aerated.  Original Report Authenticated By: Andreas Newport, M.D.     1. Rib fractures   2. Atelectasis   3. COPD (chronic obstructive pulmonary disease)   4. Coronary atherosclerosis of native coronary artery   5. H/O chest tube placement   6. Hemothorax   7. Other and unspecified hyperlipidemia   8. Respiratory distress   9. Seizure disorder   10. Syncope and collapse   11. Unspecified essential hypertension       MDM  66 year old male presented transfer from outside hospital with concern for hemothorax after a fall. The patient states that last night he was taking a shower when he passed out, landing on his left side. He had worsening pain trouble breathing today so presents to the hospital, where he was found to have multiple rib fractures and decreased aeration of left lung concerning for hemothorax which was seen on a chest x-ray. He had CT scans of the head and the abdomen which did not  show any acute findings. During transport, he had worsening shortness of breath, with increased oxygen requirement. He is complaining of decreased pain in his chest following morphine by [facility], but is still saying he is having a hard time taking a deep breath. He has ecchymoses and tenderness to the left lower side of his chest without crepitus, and decreased breath sounds bilaterally. There are no other obvious signs of trauma. Chest x-ray shows worsening aeration of the lungs compared to prior, concerning for worsening hemothorax especially given the patient's decreased respiratory status. Chest tube was placed as above with return of only a small amount of hemothorax. Trauma was at the bedside she shortly after chest tube placement, and will get a CT of the chest as well the C-spine to completely evaluate this patient prior to admission.      Theotis Burrow, MD 08/08/11 [phone]

## 2011-08-07 NOTE — ED Notes (Signed)
Dr Herma Carson pulmonologist at bedside.

## 2011-08-07 NOTE — H&P (Signed)
Jeffrey Sweeney is an 66 y.o. male.   Chief Complaint: fall with rib fractures HPI: this gentleman presents in transfer from [facility]. He apparently had a syncopal episode yesterday and fell on his left side. He presented to the hospital with left chest pain. He was found to have multiple rib fractures so was transferred to the hospital here for admission to the trauma service. Upon arrival here, he was found to be in worsening respiratory distress. An emergent chest x-ray showed the possibility of hemothorax versus collapse. A chest tube was inserted by the emergency room physician urgently. Only a mild amount of hemothorax was relieved. The patient still has ongoing shortness of breath.  He denies headache, neck pain, or abdominal pain. Prior to transfer, he had a negative CAT scan of his head and abdomen.  Past Medical History  Diagnosis Date  . Heart disease   . CAD (coronary artery disease)   . Stroke     left arm weakness  . Seizures   . Depression     Past Surgical History  Procedure Date  . Ankle fracture surgery     Family History  Problem Relation Age of Onset  . Cancer Mother   . Cancer Father    Social History:  reports that he has been smoking Cigarettes.  He has a 25 pack-year smoking history. He has never used smokeless tobacco. He reports that he does not drink alcohol or use illicit drugs.  Allergies:  Allergies  Allergen Reactions  . Meperidine Hcl      (Not in a hospital admission)  Results for orders placed during the hospital encounter of 08/07/11 (from the past 48 hour(s))  CBC WITH DIFFERENTIAL     Status: Abnormal   Collection Time   08/07/11  2:40 PM      Component Value Range Comment   WBC 11.6 (*) 4.0 - 10.5 K/uL    RBC 5.27  4.22 - 5.81 MIL/uL    Hemoglobin 17.2 (*) 13.0 - 17.0 g/dL    HCT 40.9  81.1 - 91.4 %    MCV 96.0  78.0 - 100.0 fL    MCH 32.6  26.0 - 34.0 pg    MCHC 34.0  30.0 - 36.0 g/dL    RDW 78.2  95.6 - 21.3 %    Platelets 260  150 - 400 K/uL    Neutrophils Relative 78 (*) 43 - 77 %    Neutro Abs 9.0 (*) 1.7 - 7.7 K/uL    Lymphocytes Relative 14  12 - 46 %    Lymphs Abs 1.7  0.7 - 4.0 K/uL    Monocytes Relative 7  3 - 12 %    Monocytes Absolute 0.8  0.1 - 1.0 K/uL    Eosinophils Relative 1  0 - 5 %    Eosinophils Absolute 0.1  0.0 - 0.7 K/uL    Basophils Relative 0  0 - 1 %    Basophils Absolute 0.0  0.0 - 0.1 K/uL   COMPREHENSIVE METABOLIC PANEL     Status: Abnormal   Collection Time   08/07/11  2:40 PM      Component Value Range Comment   Sodium 135  135 - 145 mEq/L    Potassium 4.4  3.5 - 5.1 mEq/L    Chloride 99  96 - 112 mEq/L    CO2 28  19 - 32 mEq/L    Glucose, Bld 109 (*) 70 - 99 mg/dL  BUN 9  6 - 23 mg/dL    Creatinine, Ser 4.09  0.50 - 1.35 mg/dL    Calcium 9.4  8.4 - 81.1 mg/dL    Total Protein 7.0  6.0 - 8.3 g/dL    Albumin 3.7  3.5 - 5.2 g/dL    AST 13  0 - 37 U/L    ALT <5  0 - 53 U/L    Alkaline Phosphatase 106  39 - 117 U/L    Total Bilirubin 0.6  0.3 - 1.2 mg/dL    GFR calc non Af Amer 89 (*) >90 mL/min    GFR calc Af Amer >90  >90 mL/min   PROTIME-INR     Status: Normal   Collection Time   08/07/11  2:40 PM      Component Value Range Comment   Prothrombin Time 12.8  11.6 - 15.2 seconds    INR 0.94  0.00 - 1.49   TROPONIN I     Status: Normal   Collection Time   08/07/11  2:43 PM      Component Value Range Comment   Troponin I <0.30  <0.30 ng/mL   URINALYSIS, ROUTINE W REFLEX MICROSCOPIC     Status: Abnormal   Collection Time   08/07/11  5:08 PM      Component Value Range Comment   Color, Urine YELLOW  YELLOW    APPearance CLEAR  CLEAR    Specific Gravity, Urine 1.025  1.005 - 1.030    pH 5.5  5.0 - 8.0    Glucose, UA NEGATIVE  NEGATIVE mg/dL    Hgb urine dipstick NEGATIVE  NEGATIVE    Bilirubin Urine SMALL (*) NEGATIVE    Ketones, ur NEGATIVE  NEGATIVE mg/dL    Protein, ur NEGATIVE  NEGATIVE mg/dL    Urobilinogen, UA 1.0  0.0 - 1.0 mg/dL    Nitrite  NEGATIVE  NEGATIVE    Leukocytes, UA NEGATIVE  NEGATIVE MICROSCOPIC NOT DONE ON URINES WITH NEGATIVE PROTEIN, BLOOD, LEUKOCYTES, NITRITE, OR GLUCOSE <1000 mg/dL.   Dg Ribs Unilateral W/chest Left  08/07/2011  *RADIOLOGY REPORT*  Clinical Data: Fall today.  Left rib pain.  Flank pain.  LEFT RIBS AND CHEST - 3+ VIEW  Comparison: Portable chest 02/09/2011.  Findings: Heart is mildly enlarged.  A prominent left pleural effusion is present.  Multiple left-sided rib fractures involve the 7th through 11th ribs laterally and anteriorly.  There is no definite pneumothorax.  Limited imaging of the upper abdomen demonstrates contrast within the left renal collecting system secondary to the patient's CT scan.  IMPRESSION:  1.  Left-sided 7th through 11th rib fractures. 2.  Left pleural effusion and airspace disease likely reflects a hemothorax and atelectasis or pulmonary contusion.  Original Report Authenticated By: Jamesetta Orleans. MATTERN, M.D.   Ct Head Wo Contrast  08/07/2011  *RADIOLOGY REPORT*  Clinical Data: Fall with history of stroke affecting left arm.  CT HEAD WITHOUT CONTRAST  Technique:  Contiguous axial images were obtained from the base of the skull through the vertex without contrast.  Comparison: 02/09/2011  Findings: Bone windows demonstrate no significant soft tissue swelling.  Mucous retention cyst or polyp in the right maxillary sinus.  There are also more cephalad secretions in this area. Persistent ethmoid air cell mucosal thickening.  Clear mastoid air cells.  Soft tissue windows demonstrate redemonstration of encephalomalacia involving the right parietal and occipital lobes.  Resultant ex vacuo dilatation of the right lateral ventricle.  Mild low density in  the periventricular white matter likely related to small vessel disease.Mild extension of encephalomalacia in the right temporal lobe which is unchanged.  No  mass lesion, hemorrhage, hydrocephalus, acute infarct, intra- axial, or extra-axial  fluid collection.  IMPRESSION:  1.  No acute or post-traumatic deformity identified. 2.  Remote right-sided hemispheric infarct. 3.  Mild small vessel ischemic change. 4.  Sinus disease.  Original Report Authenticated By: Consuello Bossier, M.D.   Ct Abdomen Pelvis W Contrast  08/07/2011  *RADIOLOGY REPORT*  Clinical Data: status post fall.  CT ABDOMEN AND PELVIS WITH CONTRAST  Technique:  Multidetector CT imaging of the abdomen and pelvis was performed following the standard protocol during bolus administration of intravenous contrast.  Contrast: OMNIPAQUE IOHEXOL 300 MG/ML  SOLN  Comparison: 02/18/2010  Findings: Atelectasis and consolidation involving the left lung with associated left lung volume loss is identified.  This is a new finding since 02/08/2010.  No pericardial or pleural effusion identified.  No focal liver abnormality.  The gallbladder is normal.  No biliary dilatation.  The pancreas is unremarkable.  Similar appearance of the spleen with deformity of the anterior portion of the splenic parenchyma.  Query prior infarct or trauma.  Both adrenal glands are normal.  Bilateral renal cysts are noted. There is a nonobstructing calculi within the upper pole the right kidney measuring 2-3 mm, image 41.  Unchanged from previous exam. The urinary bladder appears normal.  There is no adenopathy within the upper abdomen.  No pelvic or inguinal adenopathy identified.  There is no free fluid or fluid collections within the abdomen or pelvis.  Review of the visualized bony structures is significant for acute left 8th through 12th rib fracture deformities.  No right rib fracture deformities.  There is a curvature of the thoracic and lumbar spine which is convex to the right.  Mild multilevel degenerative disc disease is noted.  The vertebral body heights are well maintained.  IMPRESSION:  1.  Multiple acute left sided rib fractures. 2.  Atelectasis and consolidation involving the left midlung and left base.   Cannot rule out pulmonary contusion, pneumonia or aspiration.  There is associated volume loss of the left hemithorax.  Suggest further evaluation with CT of the chest 3.  No acute findings within the abdomen or pelvis.  Original Report Authenticated By: Rosealee Albee, M.D.   Dg Chest Portable 1 View  08/07/2011  *RADIOLOGY REPORT*  Clinical Data: Fall.  Short of breath.  Pneumonia.  PORTABLE CHEST - 1 VIEW  Comparison: 08/07/2011.  Findings: Worsening left-sided pulmonary aeration suggesting increasing pleural fluid and in the setting of trauma increasing left hemothorax.  Increasing atelectasis in the right lung.  There is probably also superimposed interstitial pulmonary edema.  The cardiopericardial silhouette is partially obscured.  IMPRESSION: Increasing opacification of the left hemithorax, compatible with increasing hemothorax in the setting of trauma.  Only a small portion of the left upper lobe remains aerated.  Original Report Authenticated By: Andreas Newport, M.D.    Review of Systems  Unable to perform ROS: mental acuity    Blood pressure 135/75, pulse 94, temperature 98.3 F (36.8 C), temperature source Oral, resp. rate 28, height 5\' 8"  (1.727 m), weight 231 lb (104.781 kg), SpO2 95.00%. Physical Exam  Constitutional: He is oriented to person, place, and time. He appears well-developed and well-nourished. He appears distressed.  HENT:  Head: Normocephalic and atraumatic.  Right Ear: External ear normal.  Left Ear: External ear normal.  Nose: Nose  normal.  Mouth/Throat: Oropharynx is clear and moist. No oropharyngeal exudate.  Eyes: Conjunctivae and EOM are normal. Pupils are equal, round, and reactive to light. Right eye exhibits no discharge. Left eye exhibits no discharge. No scleral icterus.  Neck: Normal range of motion. Neck supple. No tracheal deviation present.       c-collar is in place. There is no cervical tenderness  Cardiovascular: Regular rhythm, normal heart  sounds and intact distal pulses.   No murmur heard.      tachycardic  Respiratory: He is in respiratory distress. He has wheezes. He exhibits tenderness.       There are minimal breath sounds on the left and there is left lateral rib tenderness  GI: Soft. Bowel sounds are normal. He exhibits no distension. There is no tenderness. There is no rebound and no guarding.  Musculoskeletal: Normal range of motion. He exhibits no edema and no tenderness.  Lymphadenopathy:    He has no cervical adenopathy.  Neurological: He is alert and oriented to person, place, and time.  Skin: Skin is dry.  Psychiatric: He has a normal mood and affect. His behavior is normal.     Assessment/Plan Multiple left rib fractures status post fall  Because of the chest x-ray and the false that he may have a mucous plug, I have consulted critical care medicine to consider bronchoscopy.  We have ordered a CAT scan of his chest as well as his neck to clear the C-spine. Chest tube is placed to suction. He will be admitted to the intensive care unit for aggressive pulmonary toilet, pain management, etc.  Ismerai Bin A 08/07/2011, 8:59 PM

## 2011-08-07 NOTE — ED Notes (Signed)
PCXR done

## 2011-08-07 NOTE — Procedures (Signed)
Name:  Jeffrey Sweeney MRN:  [id number] DOB:  May 24, 1945  PROCEDURE NOTE  Procedure(s): Flexible bronchoscopy [phone]) Bronchial alveolar lavage [phone]) of the left upper lobe  Indications:  Left lung atelectasis  Consent:  Consent was obtained form daughter (medical POA)  Anesthesia:  Mechanically ventilated, sedated with Propofol  Procedure summary:  Appropriate equipment was assembled.  The patient was identified as Jeffrey Sweeney.  Safety timeout was performed. After the appropriate level of anesthesia was assured, flexible bronchoscope was lubricated and inserted through the endotracheal tube.  Airway examination was performed bilaterally to subsegmental level.  Large amount of purulent secretions were noted, mostly in the left mainstem bronchus and left upper lobe bronchus.  Saline and 20% mucomyst were used via the scope to aid secretion removal and large amount of secretions was aspirated.   Mucosa appeared friable but no endobronchial lesions were identified. Bronchial alveolar lavage of the left upper lobe was performed with 50 mL of normal saline and return of 30 mL of fluid, after which the bronchoscope was withdrawn.  Specimens sent: Bronchial alveolar lavage specimen of the left upper lobe for culture.  Complications:  No immediate complications were noted.  Hemodynamic parameters and oxygenation remained stable throughout the procedure.  Estimated blood loss:  Less then 5 mL.  Orlean Bradford, M.D. Pulmonary and Critical Care Medicine [facility] Cell: [phone]  08/07/2011, 11:27 PM

## 2011-08-07 NOTE — ED Notes (Signed)
Hollie Salk, RN gave report on patient to Elliot Gurney, RN at [facility] ED and to [facility] nurse.

## 2011-08-07 NOTE — ED Notes (Signed)
Pt has histroy of syncopal episodes. Pt fell yesterday hurting right flank/side/

## 2011-08-07 NOTE — ED Notes (Signed)
Pt requesting pain med. Md aware

## 2011-08-07 NOTE — Progress Notes (Signed)
Patient ID: Jeffrey Sweeney, male   DOB: [birth year], 66 y.o.   MRN: [id number]  CT of chest shows significant collapse/atelectasis of the left lung. There is a small PTX and it appears the chest tube is in a good position.  CCM has been consulted and has now recommended intubation followed by bronchoscopy. C-spine xray negative so c-collar removed by myself. His daughter is at the bedside and has been updated.

## 2011-08-07 NOTE — ED Notes (Signed)
Bruising noted to right chest/ abdomen.

## 2011-08-07 NOTE — ED Notes (Signed)
Daughter at bedside.

## 2011-08-08 ENCOUNTER — Inpatient Hospital Stay (HOSPITAL_COMMUNITY): Payer: Medicare Other

## 2011-08-08 DIAGNOSIS — J189 Pneumonia, unspecified organism: Secondary | ICD-10-CM

## 2011-08-08 DIAGNOSIS — J95821 Acute postprocedural respiratory failure: Secondary | ICD-10-CM

## 2011-08-08 LAB — BASIC METABOLIC PANEL
BUN: 11 mg/dL (ref 6–23)
Chloride: 102 mEq/L (ref 96–112)
Creatinine, Ser: 0.93 mg/dL (ref 0.50–1.35)
GFR calc Af Amer: >90 mL/min (ref >90)
GFR calc non Af Amer: 86 mL/min — ABNORMAL LOW (ref >90)

## 2011-08-08 LAB — CBC
HCT: 48.3 % (ref 39.0–52.0)
MCH: 32.7 pg (ref 26.0–34.0)
MCHC: 33.3 g/dL (ref 30.0–36.0)
MCV: 98.2 fL (ref 78.0–100.0)
RDW: 14.6 % (ref 11.5–15.5)

## 2011-08-08 LAB — VALPROIC ACID LEVEL: Valproic Acid Lvl: <10 ug/mL — ABNORMAL LOW (ref 50.0–100.0)

## 2011-08-08 MED ORDER — MOXIFLOXACIN HCL IN NACL 400 MG/250ML IV SOLN
400.0000 mg | INTRAVENOUS | Status: DC
  Administered 2011-08-08 – 2011-08-12 (×5): 400 mg via INTRAVENOUS
  Filled 2011-08-08 (×6): qty 250

## 2011-08-08 MED ORDER — PIVOT 1.5 CAL PO LIQD
1000.0000 mL | ORAL | Status: DC
  Administered 2011-08-09 – 2011-08-11 (×3): 1000 mL
  Filled 2011-08-08 (×4): qty 1000

## 2011-08-08 MED ORDER — ADULT MULTIVITAMIN LIQUID CH
5.0000 mL | Freq: Every day | ORAL | Status: DC
  Filled 2011-08-08: qty 5

## 2011-08-08 MED ORDER — SELENIUM 50 MCG PO TABS
200.0000 ug | ORAL_TABLET | Freq: Every day | ORAL | Status: DC
Start: 2011-08-08 — End: 2011-08-12
  Administered 2011-08-08 – 2011-08-12 (×5): 200 ug
  Filled 2011-08-08 (×5): qty 4

## 2011-08-08 MED ORDER — VANCOMYCIN HCL 1000 MG IV SOLR
2500.0000 mg | Freq: Once | INTRAVENOUS | Status: AC
  Administered 2011-08-08: 2500 mg via INTRAVENOUS
  Filled 2011-08-08: qty 2500

## 2011-08-08 MED ORDER — GUAIFENESIN 100 MG/5ML PO SYRP
200.0000 mg | ORAL_SOLUTION | Freq: Four times a day (QID) | ORAL | Status: AC
  Administered 2011-08-08 – 2011-08-11 (×12): 200 mg
  Filled 2011-08-08 (×13): qty 118

## 2011-08-08 MED ORDER — VITAMIN E 15 UNIT/0.3ML PO SOLN
400.0000 [IU] | Freq: Three times a day (TID) | ORAL | Status: DC
  Administered 2011-08-08 – 2011-08-12 (×11): 400 [IU]
  Filled 2011-08-08 (×16): qty 8

## 2011-08-08 MED ORDER — VITAMIN C 500 MG PO TABS
1000.0000 mg | ORAL_TABLET | Freq: Three times a day (TID) | ORAL | Status: DC
  Administered 2011-08-08 – 2011-08-12 (×11): 1000 mg
  Filled 2011-08-08 (×15): qty 2

## 2011-08-08 MED ORDER — VANCOMYCIN HCL IN DEXTROSE 1-5 GM/200ML-% IV SOLN
1000.0000 mg | Freq: Two times a day (BID) | INTRAVENOUS | Status: DC
  Administered 2011-08-08 – 2011-08-10 (×5): 1000 mg via INTRAVENOUS
  Filled 2011-08-08 (×7): qty 200

## 2011-08-08 MED ORDER — SODIUM CHLORIDE 0.9 % IJ SOLN
10.0000 mL | Freq: Two times a day (BID) | INTRAMUSCULAR | Status: DC
  Administered 2011-08-08 – 2011-08-09 (×4): 10 mL
  Administered 2011-08-10: 30 mL
  Administered 2011-08-10: 10 mL
  Administered 2011-08-11: 20 mL
  Administered 2011-08-11 – 2011-08-12 (×3): 10 mL
  Administered 2011-08-13: 30 mL
  Administered 2011-08-14 – 2011-08-19 (×8): 10 mL
  Filled 2011-08-08 (×2): qty 20
  Filled 2011-08-08: qty 10

## 2011-08-08 MED ORDER — MUPIROCIN 2 % EX OINT
1.0000 "application " | TOPICAL_OINTMENT | Freq: Two times a day (BID) | CUTANEOUS | Status: AC
  Administered 2011-08-08 – 2011-08-12 (×9): 1 via NASAL
  Filled 2011-08-08: qty 22

## 2011-08-08 MED ORDER — CHLORHEXIDINE GLUCONATE 0.12 % MT SOLN
15.0000 mL | Freq: Two times a day (BID) | OROMUCOSAL | Status: DC
  Administered 2011-08-08 – 2011-08-12 (×10): 15 mL via OROMUCOSAL
  Filled 2011-08-08 (×10): qty 15

## 2011-08-08 MED ORDER — ENOXAPARIN SODIUM 30 MG/0.3ML ~~LOC~~ SOLN
30.0000 mg | Freq: Two times a day (BID) | SUBCUTANEOUS | Status: DC
  Administered 2011-08-08 – 2011-08-14 (×13): 30 mg via SUBCUTANEOUS
  Filled 2011-08-08 (×14): qty 0.3

## 2011-08-08 MED ORDER — PRO-STAT SUGAR FREE PO LIQD
30.0000 mL | Freq: Every day | ORAL | Status: DC
  Administered 2011-08-08 – 2011-08-11 (×14): 30 mL
  Filled 2011-08-08 (×20): qty 30

## 2011-08-08 MED ORDER — SODIUM CHLORIDE 0.9 % IV SOLN
50.0000 ug/h | INTRAVENOUS | Status: DC
  Administered 2011-08-08: 50 ug/h via INTRAVENOUS
  Administered 2011-08-09: 200 ug/h via INTRAVENOUS
  Administered 2011-08-09: 300 ug/h via INTRAVENOUS
  Administered 2011-08-09: 50 ug/h via INTRAVENOUS
  Filled 2011-08-08 (×5): qty 50

## 2011-08-08 MED ORDER — FENTANYL BOLUS VIA INFUSION
50.0000 ug | Freq: Four times a day (QID) | INTRAVENOUS | Status: DC | PRN
  Administered 2011-08-08: 50 ug via INTRAVENOUS
  Filled 2011-08-08 (×4): qty 100

## 2011-08-08 MED ORDER — PIVOT 1.5 CAL PO LIQD
1000.0000 mL | ORAL | Status: DC
  Administered 2011-08-08: 1000 mL
  Filled 2011-08-08 (×2): qty 1000

## 2011-08-08 MED ORDER — BIOTENE DRY MOUTH MT LIQD
15.0000 mL | Freq: Four times a day (QID) | OROMUCOSAL | Status: DC
  Administered 2011-08-08 – 2011-08-12 (×19): 15 mL via OROMUCOSAL

## 2011-08-08 MED ORDER — CHLORHEXIDINE GLUCONATE CLOTH 2 % EX PADS
6.0000 | MEDICATED_PAD | Freq: Every day | CUTANEOUS | Status: AC
  Administered 2011-08-08 – 2011-08-12 (×4): 6 via TOPICAL

## 2011-08-08 MED ORDER — ADULT MULTIVITAMIN LIQUID CH
5.0000 mL | Freq: Every day | ORAL | Status: DC
  Administered 2011-08-08 – 2011-08-12 (×4): 5 mL
  Filled 2011-08-08 (×5): qty 5

## 2011-08-08 MED ORDER — SODIUM CHLORIDE 0.9 % IJ SOLN
10.0000 mL | INTRAMUSCULAR | Status: DC | PRN
  Administered 2011-08-18 – 2011-08-20 (×3): 30 mL
  Filled 2011-08-08 (×2): qty 10

## 2011-08-08 NOTE — Progress Notes (Signed)
After multiple attempts by myself and 2 other RN's, insertion of OGT failed.  I will continue to monitor.

## 2011-08-08 NOTE — Progress Notes (Signed)
UR completed 

## 2011-08-08 NOTE — Progress Notes (Signed)
Patient ID: Jeffrey Sweeney, male   DOB: Sep 04, 1945, 66 y.o.   MRN: [id number] Follow up - Trauma Critical Care  Patient Details:    Jeffrey Sweeney is an 66 y.o. male.  Lines/tubes : Airway 8 mm (Active)  Secured at (cm) 23 cm 08/08/2011  8:05 AM  Measured From Lips 08/08/2011  8:05 AM  Secured Location Center 08/08/2011  8:05 AM  Secured By [employer] 08/08/2011  8:05 AM  Tube Holder Repositioned Yes 08/08/2011  8:05 AM  Site Condition Dry 08/08/2011  8:05 AM     Chest Tube 1 Left Pleural 40 Fr. (Active)  Suction -20 cm H2O 08/08/2011 12:00 AM  Chest Tube Air Leak Minimal 08/08/2011 12:00 AM  Patency Intervention Tip/tilt 08/08/2011 12:00 AM  Drainage Description Dark red 08/08/2011 12:00 AM  Dressing Status Clean;Dry;Intact 08/08/2011 12:00 AM  Dressing Intervention New dressing 08/08/2011 12:00 AM  Site Assessment Clean;Dry;Intact 08/08/2011 12:00 AM  Surrounding Skin Unable to view 08/08/2011 12:00 AM  Output (mL) 0 mL 08/08/2011  6:00 AM     Urethral Catheter Straight-tip 16 Fr. (Active)  Indication for Insertion or Continuance of Catheter Physician order 08/08/2011 12:00 AM  Site Assessment Clean;Intact 08/08/2011 12:00 AM  Collection Container Standard drainage bag 08/08/2011 12:00 AM  Securement Method Leg strap 08/08/2011 12:00 AM  Urinary Catheter Interventions Unclamped 08/08/2011 12:00 AM  Output (mL) 25 mL 08/08/2011  7:00 AM    Microbiology/Sepsis markers: Results for orders placed during the hospital encounter of 08/07/11  MRSA PCR SCREENING     Status: Abnormal   Collection Time   08/07/11 10:31 PM      Component Value Range Status Comment   MRSA by PCR POSITIVE (*) NEGATIVE Final     Anti-infectives:  Anti-infectives    None      Best Practice/Protocols:  VTE Prophylaxis: Lovenox (prophylaxtic dose) Continous Sedation  Consults:      Studies: Dg Ribs Unilateral W/chest Left  08/07/2011  *RADIOLOGY REPORT*  Clinical Data: Fall today.  Left rib pain.  Flank pain.  LEFT  RIBS AND CHEST - 3+ VIEW  Comparison: Portable chest 02/09/2011.  Findings: Heart is mildly enlarged.  A prominent left pleural effusion is present.  Multiple left-sided rib fractures involve the 7th through 11th ribs laterally and anteriorly.  There is no definite pneumothorax.  Limited imaging of the upper abdomen demonstrates contrast within the left renal collecting system secondary to the patient's CT scan.  IMPRESSION:  1.  Left-sided 7th through 11th rib fractures. 2.  Left pleural effusion and airspace disease likely reflects a hemothorax and atelectasis or pulmonary contusion.  Original Report Authenticated By: Jamesetta Orleans. MATTERN, M.D.   Ct Head Wo Contrast  08/07/2011  *RADIOLOGY REPORT*  Clinical Data: Fall with history of stroke affecting left arm.  CT HEAD WITHOUT CONTRAST  Technique:  Contiguous axial images were obtained from the base of the skull through the vertex without contrast.  Comparison: 02/09/2011  Findings: Bone windows demonstrate no significant soft tissue swelling.  Mucous retention cyst or polyp in the right maxillary sinus.  There are also more cephalad secretions in this area. Persistent ethmoid air cell mucosal thickening.  Clear mastoid air cells.  Soft tissue windows demonstrate redemonstration of encephalomalacia involving the right parietal and occipital lobes.  Resultant ex vacuo dilatation of the right lateral ventricle.  Mild low density in the periventricular white matter likely related to small vessel disease.Mild extension of encephalomalacia in the right temporal lobe which is  unchanged.  No  mass lesion, hemorrhage, hydrocephalus, acute infarct, intra- axial, or extra-axial fluid collection.  IMPRESSION:  1.  No acute or post-traumatic deformity identified. 2.  Remote right-sided hemispheric infarct. 3.  Mild small vessel ischemic change. 4.  Sinus disease.  Original Report Authenticated By: Consuello Bossier, M.D.   Ct Chest W Contrast  08/07/2011  *RADIOLOGY  REPORT*  Clinical Data:  The patient fell at home.  Neck pain.  Left-sided rib fractures.  Pneumothorax.  New left chest tube.  CT CHEST WITH CONTRAST  Technique:  Multidetector CT imaging of the chest was performed following the standard protocol during bolus administration of intravenous contrast.  Contrast: OMNIPAQUE IOHEXOL 300 MG/ML  SOLN  Comparison: .Multiple prior studies from 08/07/2011  Findings: The patient has a left-sided chest tube, tip in the region of the left lung apex.  Small left pneumothorax persists. There is consolidation of collapsed lung on the left.  No evidence for pleural effusion.  Numerous left-sided rib fractures are present, involving at least ribs 07/09/2008.  The 11th rib is not completely imaged.  The right lung is well-aerated.  The heart size is normal.  No evidence for mediastinal injury. The visualized portion of the thyroid gland has a normal appearance.  Images of the upper abdomen are unremarkable.  IMPRESSION:  1.  Multiple left-sided rib fractures. 2.  Small left residual pneumothorax. 3.  Consolidation and atelectasis of the left lung.  Original Report Authenticated By: Patterson Hammersmith, M.D.   Ct Cervical Spine Wo Contrast  08/07/2011  *RADIOLOGY REPORT*  Clinical Data:  Trauma.  The patient fell at home.  CT CERVICAL SPINE WITHOUT CONTRAST  Technique:  Multidetector CT imaging of the cervical spine was performed without intravenous contrast.  Multiplanar CT image reconstructions were also generated.  Comparison:  Multiple prior CT exams of the head, most recent 08/07/2011  Findings:  There is moderate degenerative change throughout the cervical spine.  No evidence for acute fracture or subluxation. Left-sided chest tube is imaged at the left lung apex.  Left pneumothorax is present.  IMPRESSION:  1.  Degenerative changes. 2. No evidence for acute osseous abnormality. 3.  Left pneumothorax and chest tube.  Original Report Authenticated By: Patterson Hammersmith,  M.D.   Ct Abdomen Pelvis W Contrast  08/07/2011  *RADIOLOGY REPORT*  Clinical Data: status post fall.  CT ABDOMEN AND PELVIS WITH CONTRAST  Technique:  Multidetector CT imaging of the abdomen and pelvis was performed following the standard protocol during bolus administration of intravenous contrast.  Contrast: OMNIPAQUE IOHEXOL 300 MG/ML  SOLN  Comparison: 02/18/2010  Findings: Atelectasis and consolidation involving the left lung with associated left lung volume loss is identified.  This is a new finding since 02/08/2010.  No pericardial or pleural effusion identified.  No focal liver abnormality.  The gallbladder is normal.  No biliary dilatation.  The pancreas is unremarkable.  Similar appearance of the spleen with deformity of the anterior portion of the splenic parenchyma.  Query prior infarct or trauma.  Both adrenal glands are normal.  Bilateral renal cysts are noted. There is a nonobstructing calculi within the upper pole the right kidney measuring 2-3 mm, image 41.  Unchanged from previous exam. The urinary bladder appears normal.  There is no adenopathy within the upper abdomen.  No pelvic or inguinal adenopathy identified.  There is no free fluid or fluid collections within the abdomen or pelvis.  Review of the visualized bony structures is significant  for acute left 8th through 12th rib fracture deformities.  No right rib fracture deformities.  There is a curvature of the thoracic and lumbar spine which is convex to the right.  Mild multilevel degenerative disc disease is noted.  The vertebral body heights are well maintained.  IMPRESSION:  1.  Multiple acute left sided rib fractures. 2.  Atelectasis and consolidation involving the left midlung and left base.  Cannot rule out pulmonary contusion, pneumonia or aspiration.  There is associated volume loss of the left hemithorax.  Suggest further evaluation with CT of the chest 3.  No acute findings within the abdomen or pelvis.  Original Report  Authenticated By: Rosealee Albee, M.D.   Dg Chest Port 1 View  08/08/2011  *RADIOLOGY REPORT*  Clinical Data: Post intubation.  PORTABLE CHEST - 1 VIEW  Comparison: 08/07/2011  Findings: Endotracheal tube tip positioned 1.8 cm proximal to the carina.  Prominent cardiomediastinal contours. Left-sided chest tube in place.  Retrocardiac consolidation.  Tiny left apical pneumothorax is suggested.  There are multiple left lower lateral rib fractures.  Right lung is predominately clear. Small amount of subcutaneous gas along the left hemithorax.  IMPRESSION: Endotracheal tube tip 1.8 cm proximal to the carina.  Retrocardiac opacity may reflect atelectasis or aspiration.  Left-sided chest tube with suggestion of tiny apical pneumothorax.  Multiple left lateral rib fractures.  Original Report Authenticated By: Waneta Martins, M.D.   Dg Chest Portable 1 View  08/07/2011  *RADIOLOGY REPORT*  Clinical Data: Fall.  Short of breath.  Pneumonia.  PORTABLE CHEST - 1 VIEW  Comparison: 08/07/2011.  Findings: Worsening left-sided pulmonary aeration suggesting increasing pleural fluid and in the setting of trauma increasing left hemothorax.  Increasing atelectasis in the right lung.  There is probably also superimposed interstitial pulmonary edema.  The cardiopericardial silhouette is partially obscured.  IMPRESSION: Increasing opacification of the left hemithorax, compatible with increasing hemothorax in the setting of trauma.  Only a small portion of the left upper lobe remains aerated.  Original Report Authenticated By: Andreas Newport, M.D.     Events:  Subjective:    Overnight Issues:   Objective:  Vital signs for last 24 hours: Temp:  [98.2 F (36.8 C)-99.3 F (37.4 C)] 99.3 F (37.4 C) (08/01 0813) Pulse Rate:  [55-98] 78  (08/01 0805) Resp:  [15-31] 29  (08/01 0805) BP: (92-171)/(52-90) 98/59 mmHg (08/01 0805) SpO2:  [74 %-100 %] 96 % (08/01 0805) FiO2 (%):  [40 %-50 %] 40 % (08/01 0805) Weight:   [104.781 kg (231 lb)-108 kg (238 lb 1.6 oz)] 108 kg (238 lb 1.6 oz) (08/01 0400)  Hemodynamic parameters for last 24 hours:    Intake/Output from previous day: 07/31 0701 - 08/01 0700 In: 1633.7 [I.V.:1413.7; IV Piggyback:220] Out: 745 [Urine:645; Chest Tube:100]  Intake/Output this shift:    Vent settings for last 24 hours: Vent Mode:  [-] CPAP;PSV FiO2 (%):  [40 %-50 %] 40 % Set Rate:  [14 bmp] 14 bmp Vt Set:  [550 mL] 550 mL PEEP:  [5 cmH20] 5 cmH20 Pressure Support:  [5 cmH20] 5 cmH20 Plateau Pressure:  [20 cmH20-25 cmH20] 25 cmH20  Physical Exam:  General: on vent Neuro: arouses and F/C well HEENT/Neck: ETT WNL  Resp: mild wheeze and rhonchi L>R CVS: RRR with occasional ectopy, rate 70s GI: I placed OG tube without difficulty - to suction and mostly air out, +BS, soft, NT Extremities: feet cool  Results for orders placed during the hospital encounter of  08/07/11 (from the past 24 hour(s))  CBC WITH DIFFERENTIAL     Status: Abnormal   Collection Time   08/07/11  2:40 PM      Component Value Range   WBC 11.6 (*) 4.0 - 10.5 K/uL   RBC 5.27  4.22 - 5.81 MIL/uL   Hemoglobin 17.2 (*) 13.0 - 17.0 g/dL   HCT 86.5  78.4 - 69.6 %   MCV 96.0  78.0 - 100.0 fL   MCH 32.6  26.0 - 34.0 pg   MCHC 34.0  30.0 - 36.0 g/dL   RDW 29.5  28.4 - 13.2 %   Platelets 260  150 - 400 K/uL   Neutrophils Relative 78 (*) 43 - 77 %   Neutro Abs 9.0 (*) 1.7 - 7.7 K/uL   Lymphocytes Relative 14  12 - 46 %   Lymphs Abs 1.7  0.7 - 4.0 K/uL   Monocytes Relative 7  3 - 12 %   Monocytes Absolute 0.8  0.1 - 1.0 K/uL   Eosinophils Relative 1  0 - 5 %   Eosinophils Absolute 0.1  0.0 - 0.7 K/uL   Basophils Relative 0  0 - 1 %   Basophils Absolute 0.0  0.0 - 0.1 K/uL  COMPREHENSIVE METABOLIC PANEL     Status: Abnormal   Collection Time   08/07/11  2:40 PM      Component Value Range   Sodium 135  135 - 145 mEq/L   Potassium 4.4  3.5 - 5.1 mEq/L   Chloride 99  96 - 112 mEq/L   CO2 28  19 - 32 mEq/L     Glucose, Bld 109 (*) 70 - 99 mg/dL   BUN 9  6 - 23 mg/dL   Creatinine, Ser 4.40  0.50 - 1.35 mg/dL   Calcium 9.4  8.4 - 10.2 mg/dL   Total Protein 7.0  6.0 - 8.3 g/dL   Albumin 3.7  3.5 - 5.2 g/dL   AST 13  0 - 37 U/L   ALT <5  0 - 53 U/L   Alkaline Phosphatase 106  39 - 117 U/L   Total Bilirubin 0.6  0.3 - 1.2 mg/dL   GFR calc non Af Amer 89 (*) >90 mL/min   GFR calc Af Amer >90  >90 mL/min  PROTIME-INR     Status: Normal   Collection Time   08/07/11  2:40 PM      Component Value Range   Prothrombin Time 12.8  11.6 - 15.2 seconds   INR 0.94  0.00 - 1.49  TROPONIN I     Status: Normal   Collection Time   08/07/11  2:43 PM      Component Value Range   Troponin I <0.30  <0.30 ng/mL  URINALYSIS, ROUTINE W REFLEX MICROSCOPIC     Status: Abnormal   Collection Time   08/07/11  5:08 PM      Component Value Range   Color, Urine YELLOW  YELLOW   APPearance CLEAR  CLEAR   Specific Gravity, Urine 1.025  1.005 - 1.030   pH 5.5  5.0 - 8.0   Glucose, UA NEGATIVE  NEGATIVE mg/dL   Hgb urine dipstick NEGATIVE  NEGATIVE   Bilirubin Urine SMALL (*) NEGATIVE   Ketones, ur NEGATIVE  NEGATIVE mg/dL   Protein, ur NEGATIVE  NEGATIVE mg/dL   Urobilinogen, UA 1.0  0.0 - 1.0 mg/dL   Nitrite NEGATIVE  NEGATIVE   Leukocytes, UA NEGATIVE  NEGATIVE  CBC     Status: Abnormal   Collection Time   08/07/11  8:41 PM      Component Value Range   WBC 14.4 (*) 4.0 - 10.5 K/uL   RBC 5.01  4.22 - 5.81 MIL/uL   Hemoglobin 16.2  13.0 - 17.0 g/dL   HCT 16.1  09.6 - 04.5 %   MCV 97.0  78.0 - 100.0 fL   MCH 32.3  26.0 - 34.0 pg   MCHC 33.3  30.0 - 36.0 g/dL   RDW 40.9  81.1 - 91.4 %   Platelets 239  150 - 400 K/uL  MRSA PCR SCREENING     Status: Abnormal   Collection Time   08/07/11 10:31 PM      Component Value Range   MRSA by PCR POSITIVE (*) NEGATIVE  VALPROIC ACID LEVEL     Status: Abnormal   Collection Time   08/08/11 12:01 AM      Component Value Range   Valproic Acid Lvl <10.0 (*) 50.0 - 100.0  ug/mL  CBC     Status: Abnormal   Collection Time   08/08/11  6:45 AM      Component Value Range   WBC 19.2 (*) 4.0 - 10.5 K/uL   RBC 4.92  4.22 - 5.81 MIL/uL   Hemoglobin 16.1  13.0 - 17.0 g/dL   HCT 78.2  95.6 - 21.3 %   MCV 98.2  78.0 - 100.0 fL   MCH 32.7  26.0 - 34.0 pg   MCHC 33.3  30.0 - 36.0 g/dL   RDW 08.6  57.8 - 46.9 %   Platelets 217  150 - 400 K/uL  BASIC METABOLIC PANEL     Status: Abnormal   Collection Time   08/08/11  6:45 AM      Component Value Range   Sodium 137  135 - 145 mEq/L   Potassium 3.9  3.5 - 5.1 mEq/L   Chloride 102  96 - 112 mEq/L   CO2 24  19 - 32 mEq/L   Glucose, Bld 96  70 - 99 mg/dL   BUN 11  6 - 23 mg/dL   Creatinine, Ser 6.29  0.50 - 1.35 mg/dL   Calcium 8.8  8.4 - 52.8 mg/dL   GFR calc non Af Amer 86 (*) >90 mL/min   GFR calc Af Amer >90  >90 mL/min    Assessment & Plan: Present on Admission:  .SYNCOPE .Seizure disorder   LOS: 1 day   Additional comments:I reviewed the patient's new clinical lab test results. and radiologic studies Fall L rib Fx 7-11 with significant ATX/secretions - CXR much improved S/P bronch (appreciate CCM assist with that), BDs, add guaifenesin VDRF due to above - wean today, large volume secretions will preclude extubation for 24 hours+ ID - resp Cx pending from bronch, likely community acquired PNA and WBC up so start empiric Avelox and Vanc (MRSA screen +) FEN - start TF, lytes OK Sz disorder - Keppra and valproic acid Hx HTN - BP 90s so hold Norvasc VTE - start Lovenox Critical Care Total Time*: 42 Minutes  Violeta Gelinas, MD, MPH, FACS Pager: [phone]  08/08/2011  *Care during the described time interval was provided by me and/or other providers on the critical care team.  I have reviewed this patient's available data, including medical history, events of note, physical examination and test results as part of my evaluation.

## 2011-08-08 NOTE — Progress Notes (Signed)
ANTIBIOTIC CONSULT NOTE - INITIAL  Pharmacy Consult for Vancomycin  Indication: MRSA swab +; ?Community acquired PNA  Allergies  Allergen Reactions  . Meperidine Hcl     Patient Measurements: Height: 5\' 8"  (172.7 cm) Weight: 238 lb 1.6 oz (108 kg) IBW/kg (Calculated) : 68.4    Vital Signs: Temp: 99.3 F (37.4 C) (08/01 0813) Temp src: Axillary (08/01 0813) BP: 106/51 mmHg (08/01 0900) Pulse Rate: 74  (08/01 0900) Intake/Output from previous day: 07/31 0701 - 08/01 0700 In: 1633.7 [I.V.:1413.7; IV Piggyback:220] Out: 745 [Urine:645; Chest Tube:100] Intake/Output from this shift: Total I/O In: 219.4 [I.V.:219.4] Out: 75 [Urine:75]  Labs:  [facility] 08/08/11 0645 08/07/11 2041 08/07/11 1440  WBC 19.2* 14.4* 11.6*  HGB 16.1 16.2 17.2*  PLT 217 239 260  LABCREA -- -- --  CREATININE 0.93 -- 0.87   Estimated Creatinine Clearance: 94.3 ml/min (by C-G formula based on Cr of 0.93). No results found for this basename: VANCOTROUGH:2,VANCOPEAK:2,VANCORANDOM:2,GENTTROUGH:2,GENTPEAK:2,GENTRANDOM:2,TOBRATROUGH:2,TOBRAPEAK:2,TOBRARND:2,AMIKACINPEAK:2,AMIKACINTROU:2,AMIKACIN:2, in the last 72 hours   Microbiology: Recent Results (from the past 720 hour(s))  MRSA PCR SCREENING     Status: Abnormal   Collection Time   08/07/11 10:31 PM      Component Value Range Status Comment   MRSA by PCR POSITIVE (*) NEGATIVE Final     Medical History: Past Medical History  Diagnosis Date  . Heart disease   . CAD (coronary artery disease)   . Stroke     left arm weakness  . Seizures   . Depression     Medications:  Prescriptions prior to admission  Medication Sig Dispense Refill  . albuterol (PROAIR HFA) 108 (90 BASE) MCG/ACT inhaler Inhale 2 puffs into the lungs 4 (four) times daily.      Marland Kitchen ALPRAZolam (XANAX) 1 MG tablet Take 1 mg by mouth 3 (three) times daily as needed. For anxiety      . amLODipine (NORVASC) 5 MG tablet Take 5 mg by mouth daily.      Marland Kitchen atorvastatin (LIPITOR) 40  MG tablet Take 40 mg by mouth at bedtime.       . Eszopiclone (ESZOPICLONE) 3 MG TABS Take 3 mg by mouth at bedtime. Take immediately before bedtime      . HYDROcodone-acetaminophen (NORCO) 7.5-325 MG per tablet Take 1 tablet by mouth 4 (four) times daily.      Marland Kitchen levETIRAcetam (KEPPRA) 1000 MG tablet Take 1,000 mg by mouth 3 (three) times daily.      . nitroGLYCERIN (NITROSTAT) 0.4 MG SL tablet Place 0.4 mg under the tongue every 5 (five) minutes as needed. Chest pain      . pantoprazole (PROTONIX) 40 MG tablet Take 40 mg by mouth daily.        . phenelzine (NARDIL) 15 MG tablet Take 2 tablets (30 mg total) by mouth 3 (three) times daily.  180 tablet  1  . potassium chloride 20 MEQ/15ML (10%) SOLN Take 20 mEq by mouth daily.      . Valproic Acid (DEPAKENE) 250 MG/5ML SYRP syrup Take 500 mg by mouth daily.        Scheduled:    . sodium chloride   Intravenous Once  . acetylcysteine  12 mL Per Tube Once  . acetylcysteine  2 mL Nebulization Q4H  . albuterol  2.5 mg Nebulization Once  . ipratropium  0.5 mg Nebulization Q4H   And  . albuterol  2.5 mg Nebulization Q4H  . antiseptic oral rinse  15 mL Mouth Rinse QID  . chlorhexidine  15 mL Mouth Rinse BID  . Chlorhexidine Gluconate Cloth  6 each Topical Q0600  . enoxaparin (LOVENOX) injection  30 mg Subcutaneous Q12H  . feeding supplement (PIVOT 1.5 CAL)  1,000 mL Per Tube Q24H  . fentaNYL  50 mcg Intravenous Once  . fentaNYL  50 mcg Intravenous Once  . guaifenesin  200 mg Per Tube Q6H  . ipratropium  0.5 mg Nebulization Once  . ketorolac  30 mg Intravenous Once  . levetiracetam  1,000 mg Intravenous Q8H  . morphine      . moxifloxacin  400 mg Intravenous Q24H  . mupirocin ointment  1 application Nasal BID  . pantoprazole  40 mg Oral Q1200   Or  . pantoprazole (PROTONIX) IV  40 mg Intravenous Q1200  . propofol      . selenium  200 mcg Per Tube Daily  . Valproic Acid  500 mg Per Tube Daily  . vitamin C  1,000 mg Per Tube Q8H  . vitamin  e  400 Units Per Tube Q8H  . DISCONTD: amLODipine  5 mg Oral Daily   Assessment: 66 yo male with + MRSA swab and WBC elevated. Likely community acquired PNA.  Starting Avelox and Vancomycin. Pharmacy consulted to dose Vancomycin. SCr = 0.93, CrCl ~ 94 ml/min. Patient weighs 108 kg   Goal of Therapy:  Vancomycin trough level 15-20 mcg/ml  Plan:  Vancomycin 2500 mg IV loading dose x 1 then 1000 mg IV q12hr. Check steady state trough.   Noah Delaine, RPh Clinical Pharmacist Pager: [phone] 08/08/2011,9:41 AM

## 2011-08-08 NOTE — Progress Notes (Signed)
INITIAL ADULT NUTRITION ASSESSMENT Date: 08/08/2011   Time: 12:00 PM  INTERVENTION:  Pivot 1.5 formula at goal rate of 25 ml/hr with Prostat liquid protein 30 ml 5 times daily via tube to provide 1400 total kcals (70% of estimated kcal needs), 131 gm protein (100% of estimated protein needs), 455 ml of free water  Liquid MVI daily via tube RD to follow for nutrition care plan  Reason for Assessment: Consult  ASSESSMENT: Male 66 y.o.  Dx: s/p fall with rib fractures  Hx:  Past Medical History  Diagnosis Date  . Heart disease   . CAD (coronary artery disease)   . Stroke     left arm weakness  . Seizures   . Depression     Related Meds:    . sodium chloride   Intravenous Once  . acetylcysteine  12 mL Per Tube Once  . acetylcysteine  2 mL Nebulization Q4H  . albuterol  2.5 mg Nebulization Once  . ipratropium  0.5 mg Nebulization Q4H   And  . albuterol  2.5 mg Nebulization Q4H  . antiseptic oral rinse  15 mL Mouth Rinse QID  . chlorhexidine  15 mL Mouth Rinse BID  . Chlorhexidine Gluconate Cloth  6 each Topical Q0600  . enoxaparin (LOVENOX) injection  30 mg Subcutaneous Q12H  . feeding supplement (PIVOT 1.5 CAL)  1,000 mL Per Tube Q24H  . fentaNYL  50 mcg Intravenous Once  . fentaNYL  50 mcg Intravenous Once  . guaifenesin  200 mg Per Tube Q6H  . ipratropium  0.5 mg Nebulization Once  . ketorolac  30 mg Intravenous Once  . levetiracetam  1,000 mg Intravenous Q8H  . morphine      . moxifloxacin  400 mg Intravenous Q24H  . mupirocin ointment  1 application Nasal BID  . pantoprazole  40 mg Oral Q1200   Or  . pantoprazole (PROTONIX) IV  40 mg Intravenous Q1200  . propofol      . selenium  200 mcg Per Tube Daily  . sodium chloride  10-40 mL Intracatheter Q12H  . Valproic Acid  500 mg Per Tube Daily  . vancomycin  2,500 mg Intravenous Once  . vancomycin  1,000 mg Intravenous Q12H  . vitamin C  1,000 mg Per Tube Q8H  . vitamin e  400 Units Per Tube Q8H  . DISCONTD:  amLODipine  5 mg Oral Daily    Ht: 5\' 8"  (172.7 cm)  Wt: 238 lb 1.6 oz (108 kg)  Ideal Wt: 70 kg % Ideal Wt: 154%  Usual Wt: unable to obtain % Usual Wt: ---  Body mass index is 36.20 kg/(m^2).  Food/Nutrition Related Hx: admission nutrition screen incomplete  Labs:  CMP     Component Value Date/Time   NA 137 08/08/2011 0645   K 3.9 08/08/2011 0645   CL 102 08/08/2011 0645   CO2 24 08/08/2011 0645   GLUCOSE 96 08/08/2011 0645   BUN 11 08/08/2011 0645   CREATININE 0.93 08/08/2011 0645   CALCIUM 8.8 08/08/2011 0645   PROT 7.0 08/07/2011 1440   ALBUMIN 3.7 08/07/2011 1440   AST 13 08/07/2011 1440   ALT <5 08/07/2011 1440   ALKPHOS 106 08/07/2011 1440   BILITOT 0.6 08/07/2011 1440   GFRNONAA 86* 08/08/2011 0645   GFRAA >90 08/08/2011 0645     Intake/Output Summary (Last 24 hours) at 08/08/11 1202 Last data filed at 08/08/11 1133  Gross per 24 hour  Intake 2247.5 ml  Output  860 ml  Net 1387.5 ml    Diet Order: NPO  Supplements/Tube Feeding: Pivot 1.5 formula ordered per Adult Enteral Nutrition Protocol  IVF:    0.9 % NaCl with KCl 20 mEq / L Last Rate: 100 mL/hr at 08/08/11 1133  fentaNYL infusion INTRAVENOUS Last Rate: 50 mcg/hr (08/08/11 1130)  propofol Last Rate: 15 mcg/kg/min (08/08/11 0800)    Estimated Nutritional Needs:   Kcal: 2000 Protein: 130-140 gm Fluid: 2.0-2.2 L  Patient is currently intubated on ventilator support.  MV: 9.4 Temp: 37.2 C  Transferred from [facility]; had a syncopal episode and fell on his left side; upon transfer, patient found to be in worsening respiratory distress; intubated 7/31; s/p bronchoscopy and bronchial alveolar lavage of left upper lobe; OGT in place; Propofol discontinued; on Fentanyl drip; RD consulted to initiate and manage EN  NUTRITION DIAGNOSIS: -Inadequate oral intake (NI-2.1).  Status: Ongoing  RELATED TO: inability to eat  AS EVIDENCE BY: NPO status  MONITORING/EVALUATION(Goals): Goal: EN to provide 60-70% of estimated  calorie needs (22-25 kcals/kg ideal body weight) and >/= 90% of estimated protein needs, based on ASPEN guidelines for permissive underfeeding in critically ill obese individuals Monitor: EN regimen & tolerance, respiratory status, weight, labs, I/O's  EDUCATION NEEDS: -No education needs identified at this time  Dietitian #: [phone]  DOCUMENTATION CODES Per approved criteria  -Obesity Unspecified    Jeffrey Sweeney 08/08/2011, 12:00 PM

## 2011-08-08 NOTE — Progress Notes (Signed)
Peripherally Inserted Central Catheter/Midline Placement  The IV Nurse has discussed with the patient and/or persons authorized to consent for the patient, the purpose of this procedure and the potential benefits and risks involved with this procedure.  The benefits include less needle sticks, lab draws from the catheter and patient may be discharged home with the catheter.  Risks include, but not limited to, infection, bleeding, blood clot (thrombus formation), and puncture of an artery; nerve damage and irregular heat beat.  Alternatives to this procedure were also discussed.  PICC/Midline Placement Documentation        Jeffrey Sweeney 08/08/2011, 11:26 AM

## 2011-08-08 NOTE — Progress Notes (Signed)
D.w trauma Will sign off, call as needed Mcarthur Rossetti. Tyson Alias, MD, FACP Pgr: [phone] [facility]

## 2011-08-09 ENCOUNTER — Inpatient Hospital Stay (HOSPITAL_COMMUNITY): Payer: Medicare Other

## 2011-08-09 LAB — GLUCOSE, CAPILLARY

## 2011-08-09 LAB — POCT I-STAT 3, ART BLOOD GAS (G3+)
Acid-base deficit: 3 mmol/L — ABNORMAL HIGH (ref 0.0–2.0)
Bicarbonate: 24.3 meq/L — ABNORMAL HIGH (ref 20.0–24.0)
pCO2 arterial: 52.2 mmHg — ABNORMAL HIGH (ref 35.0–45.0)
pH, Arterial: 7.278 — ABNORMAL LOW (ref 7.350–7.450)
pO2, Arterial: 82 mmHg (ref 80.0–100.0)

## 2011-08-09 LAB — COMPREHENSIVE METABOLIC PANEL
ALT: <5 U/L (ref 0–53)
Albumin: 2.8 g/dL — ABNORMAL LOW (ref 3.5–5.2)
Alkaline Phosphatase: 85 U/L (ref 39–117)
BUN: 13 mg/dL (ref 6–23)
Chloride: 105 mEq/L (ref 96–112)
Potassium: 4.3 mEq/L (ref 3.5–5.1)
Sodium: 138 mEq/L (ref 135–145)
Total Bilirubin: 0.7 mg/dL (ref 0.3–1.2)

## 2011-08-09 LAB — CBC
HCT: 46.8 % (ref 39.0–52.0)
Hemoglobin: 15.2 g/dL (ref 13.0–17.0)
RDW: 14.7 % (ref 11.5–15.5)
WBC: 20.2 10*3/uL — ABNORMAL HIGH (ref 4.0–10.5)

## 2011-08-09 MED ORDER — MIDAZOLAM HCL 2 MG/2ML IJ SOLN
2.0000 mg | INTRAMUSCULAR | Status: DC | PRN
  Administered 2011-08-09: 4 mg via INTRAVENOUS

## 2011-08-09 MED ORDER — SODIUM CHLORIDE 0.9 % IV SOLN
500.0000 mL | Freq: Once | INTRAVENOUS | Status: AC
  Administered 2011-08-09: 500 mL via INTRAVENOUS

## 2011-08-09 MED ORDER — MIDAZOLAM HCL 2 MG/2ML IJ SOLN
2.0000 mg | INTRAMUSCULAR | Status: DC | PRN
  Filled 2011-08-09: qty 4

## 2011-08-09 NOTE — Progress Notes (Signed)
Follow up - Trauma and Critical Care  Patient Details:    Jeffrey Sweeney is an 66 y.o. male.  Lines/tubes : Airway 8 mm (Active)  Secured at (cm) 23 cm 08/09/2011  4:08 AM  Measured From Lips 08/09/2011  4:08 AM  Secured Location Left 08/09/2011  4:08 AM  Secured By [employer] 08/09/2011  4:08 AM  Tube Holder Repositioned Yes 08/09/2011  4:08 AM  Cuff Pressure (cm H2O) 24 cm H2O 08/08/2011  8:08 PM  Site Condition Dry 08/09/2011  4:08 AM     PICC Triple Lumen 08/08/11 PICC Right Basilic (Active)  Indication for Insertion or Continuance of Line Prolonged intravenous therapies 08/08/2011  8:00 PM  Site Assessment Clean;Dry;Intact 08/08/2011  8:00 PM  Lumen #1 Status Infusing 08/08/2011  8:00 PM  Lumen #2 Status Infusing 08/08/2011  8:00 PM  Lumen #3 Status Capped (Central line);Blood return noted;Flushed 08/08/2011  8:00 PM  Dressing Type Transparent 08/08/2011  8:00 PM  Dressing Status Clean;Dry;Intact 08/08/2011  8:00 PM  Line Care Connections checked and tightened 08/08/2011  8:00 PM     Chest Tube 1 Left Pleural 40 Fr. (Active)  Suction To water seal 08/08/2011  8:00 PM  Chest Tube Air Leak None 08/08/2011  8:00 PM  Patency Intervention Tip/tilt 08/08/2011  8:00 AM  Drainage Description Serosanguineous 08/08/2011  8:00 PM  Dressing Status Clean;Dry;Intact 08/08/2011  8:00 PM  Dressing Intervention New dressing 08/08/2011 12:00 AM  Site Assessment Clean;Dry;Intact 08/08/2011  8:00 PM  Surrounding Skin Unable to view 08/08/2011  8:00 PM  Output (mL) 30 mL 08/09/2011  2:00 AM     NG/OG Tube Orogastric (Active)  Placement Verification Auscultation 08/08/2011  8:00 PM  Site Assessment Clean;Dry;Intact 08/08/2011  8:00 PM  Status Infusing tube feed 08/08/2011  8:00 PM  Drainage Appearance Clear 08/08/2011  8:00 AM  Intake (mL) 30 mL 08/09/2011  8:00 AM     Urethral Catheter Straight-tip 16 Fr. (Active)  Indication for Insertion or Continuance of Catheter Physician order;Prolonged immobilization;Urinary output  monitoring 08/08/2011  8:00 PM  Site Assessment Clean;Intact 08/08/2011  8:00 PM  Collection Container Standard drainage bag 08/08/2011  8:00 PM  Securement Method Leg strap 08/08/2011  8:00 PM  Urinary Catheter Interventions Unclamped 08/08/2011  8:00 PM  Output (mL) 40 mL 08/09/2011  8:00 AM    Microbiology/Sepsis markers: Results for orders placed during the hospital encounter of 08/07/11  MRSA PCR SCREENING     Status: Abnormal   Collection Time   08/07/11 10:31 PM      Component Value Range Status Comment   MRSA by PCR POSITIVE (*) NEGATIVE Final   CULTURE, RESPIRATORY     Status: Normal (Preliminary result)   Collection Time   08/07/11 11:47 PM      Component Value Range Status Comment   Specimen Description TRACHEAL ASPIRATE   Final    Special Requests NONE   Final    Gram Stain     Final    Value: FEW WBC PRESENT,BOTH PMN AND MONONUCLEAR     NO SQUAMOUS EPITHELIAL CELLS SEEN     NO ORGANISMS SEEN   Culture PENDING   Incomplete    Report Status PENDING   Incomplete     Anti-infectives:  Anti-infectives     Start     Dose/Rate Route Frequency Ordered Stop   08/08/11 2300   vancomycin (VANCOCIN) IVPB 1000 mg/200 mL premix        1,000 mg 200 mL/hr over 60  Minutes Intravenous Every 12 hours 08/08/11 0954     08/08/11 1100   vancomycin (VANCOCIN) 2,500 mg in sodium chloride 0.9 % 500 mL IVPB        2,500 mg 250 mL/hr over 120 Minutes Intravenous  Once 08/08/11 0954 08/08/11 1443   08/08/11 1000   moxifloxacin (AVELOX) IVPB 400 mg        400 mg 250 mL/hr over 60 Minutes Intravenous Every 24 hours 08/08/11 0847 08/18/11 0959          Best Practice/Protocols:  VTE Prophylaxis: Lovenox (prophylaxtic dose) and Mechanical GI Prophylaxis: Proton Pump Inhibitor Continous Sedation  Consults:      Events:  Subjective:    Overnight Issues: Patient was restless last night and had to be sedated more with the fentanyl.  Still sedated this morning.  Objective:  Vital signs for  last 24 hours: Temp:  [98.6 F (37 C)-101.9 F (38.8 C)] 99.7 F (37.6 C) (08/02 0818) Pulse Rate:  [65-104] 92  (08/02 0700) Resp:  [13-26] 13  (08/02 0700) BP: (98-121)/(49-66) 109/56 mmHg (08/02 0700) SpO2:  [87 %-100 %] 93 % (08/02 0700) FiO2 (%):  [40 %] 40 % (08/02 0430) Weight:  [92.2 kg (203 lb 4.2 oz)] 92.2 kg (203 lb 4.2 oz) (08/02 0400)  Hemodynamic parameters for last 24 hours:    Intake/Output from previous day: 08/01 0701 - 08/02 0700 In: 4948.2 [I.V.:2638.2; NG/GT:1030; IV Piggyback:1280] Out: 870 [Urine:810; Chest Tube:60]  Intake/Output this shift: Total I/O In: 130 [I.V.:100; NG/GT:30] Out: 40 [Urine:40]  Vent settings for last 24 hours: Vent Mode:  [-] PRVC FiO2 (%):  [40 %] 40 % Set Rate:  [14 bmp] 14 bmp Vt Set:  [550 mL] 550 mL PEEP:  [5 cmH20] 5 cmH20 Pressure Support:  [5 cmH20] 5 cmH20 Plateau Pressure:  [17 cmH20-21 cmH20] 21 cmH20  Physical Exam:  General: no respiratory distress and Still too sedated to respond. Neuro: nonfocal exam and RASS -2 Resp: diminished breath sounds RML and RUL and rales RML GI: hypoactive BS and tolerating tube feedings. Skin: no rash Extremities: no edema, no erythema, pulses WNL and feet are ice cold without palpable pulses, but well perfused.  Results for orders placed during the hospital encounter of 08/07/11 (from the past 24 hour(s))  CBC     Status: Abnormal   Collection Time   08/09/11  4:20 AM      Component Value Range   WBC 20.2 (*) 4.0 - 10.5 K/uL   RBC 4.75  4.22 - 5.81 MIL/uL   Hemoglobin 15.2  13.0 - 17.0 g/dL   HCT 16.1  09.6 - 04.5 %   MCV 98.5  78.0 - 100.0 fL   MCH 32.0  26.0 - 34.0 pg   MCHC 32.5  30.0 - 36.0 g/dL   RDW 40.9  81.1 - 91.4 %   Platelets 232  150 - 400 K/uL  COMPREHENSIVE METABOLIC PANEL     Status: Abnormal   Collection Time   08/09/11  4:20 AM      Component Value Range   Sodium 138  135 - 145 mEq/L   Potassium 4.3  3.5 - 5.1 mEq/L   Chloride 105  96 - 112 mEq/L   CO2  26  19 - 32 mEq/L   Glucose, Bld 114 (*) 70 - 99 mg/dL   BUN 13  6 - 23 mg/dL   Creatinine, Ser 7.82  0.50 - 1.35 mg/dL   Calcium 8.6  8.4 -  10.5 mg/dL   Total Protein 6.4  6.0 - 8.3 g/dL   Albumin 2.8 (*) 3.5 - 5.2 g/dL   AST 15  0 - 37 U/L   ALT <5  0 - 53 U/L   Alkaline Phosphatase 85  39 - 117 U/L   Total Bilirubin 0.7  0.3 - 1.2 mg/dL   GFR calc non Af Amer >90  >90 mL/min   GFR calc Af Amer >90  >90 mL/min     Assessment/Plan:   NEURO  Altered Mental Status:  sedation   Plan: CPM, not able to wean yet.  PULM  Atelectasis/collapse (bibasilar)   Plan: Work with RT to improve ventilation.  Get ABG this mroning.  CARDIO  No specific disorder.   Plan: CPM  RENAL  Oliguria (probably hypovolemia)   Plan: Saline bolus  GI  No specific disorder. Tolerating tube feedings.   Plan: Continue tube feedings until ready for extubation.  ID  No known infectious process   Plan: CPM.  WBC is increased, will check tomorrow AM  HEME  Leukocytosis (neutrophilia and Probably infectious in etiolgy)   Plan: CPM  ENDO No specific issues   Plan: CPM  Global Issues  Patient with multiple rib fractures and a left chest tube.  That CT can come out.  UO not great.  Not weaning from the ventilator yet.  Will check ABG.  Unsure why WBC is elevated    LOS: 2 days   Additional comments:I reviewed the patient's new clinical lab test results. cbc/bmet and I reviewed the patients new imaging test results. cxr  Critical Care Total Time*: 30 Minutes  Gilliam Hawkes O 08/09/2011  *Care during the described time interval was provided by me and/or other providers on the critical care team.  I have reviewed this patient's available data, including medical history, events of note, physical examination and test results as part of my evaluation.

## 2011-08-09 NOTE — Progress Notes (Signed)
eLink Physician-Brief Progress Note Patient Name: Jeffrey Sweeney DOB: [birth year] MRN: [id number]  Date of Service  08/09/2011   HPI/Events of Note   Pt. Not sedated enough, awake and uncomfortable with the vent. Vitals stable.   eICU Interventions  Will start versed prn.      Catha Brow 08/09/2011, 9:28 PM

## 2011-08-09 NOTE — Significant Event (Signed)
10:00am-Spoken with MD Lindie Spruce to give update regarding patient's latest ABG. Per MD continue with present orders and stated he will be in unit to see patient. Will monitor. Lauriana Denes, Charity fundraiser.     Ref. Range 08/09/2011 09:20  Sample type No range found ARTERIAL  pH, Arterial Latest Range: 7.350-7.450  7.278 (L)  pCO2 arterial Latest Range: 35.0-45.0 mmHg 52.2 (H)  pO2, Arterial Latest Range: 80.0-100.0 mmHg 82.0  Bicarbonate Latest Range: 20.0-24.0 mEq/L 24.3 (H)  TCO2 Latest Range: 0-100 mmol/L 26  Acid-base deficit Latest Range: 0.0-2.0 mmol/L 3.0 (H)  O2 Saturation No range found 94.0  Patient temperature No range found 99.5 F

## 2011-08-09 NOTE — ED Provider Notes (Signed)
I saw and evaluated the patient, reviewed the resident's note and I agree with the findings and plan.  Cheri Guppy, MD 08/09/11 [phone]

## 2011-08-09 NOTE — Progress Notes (Signed)
Jeffrey Sweeney has been asynchronous with ventilator support for the last 45 mins, have attempted sedation and providing bedside re-assurance and support to no avail.  E-link called and order for sitter asked for with the hopes of minimizing sedation tonight for possible extubation in the morning. Will continue to monitor closely.

## 2011-08-10 ENCOUNTER — Inpatient Hospital Stay (HOSPITAL_COMMUNITY): Payer: Medicare Other

## 2011-08-10 LAB — BLOOD GAS, ARTERIAL
Acid-base deficit: 0.1 mmol/L (ref 0.0–2.0)
Bicarbonate: 25.4 mEq/L — ABNORMAL HIGH (ref 20.0–24.0)
FIO2: 0.4 %
O2 Saturation: 95.6 %
PEEP: 5 cmH2O
Patient temperature: 98.6
RATE: 14 resp/min
pO2, Arterial: 82.8 mmHg (ref 80.0–100.0)

## 2011-08-10 LAB — URINALYSIS, ROUTINE W REFLEX MICROSCOPIC
Glucose, UA: NEGATIVE mg/dL
Hgb urine dipstick: NEGATIVE
Specific Gravity, Urine: 1.022 (ref 1.005–1.030)
pH: 5.5 (ref 5.0–8.0)

## 2011-08-10 LAB — GLUCOSE, CAPILLARY
Glucose-Capillary: 110 mg/dL — ABNORMAL HIGH (ref 70–99)
Glucose-Capillary: 123 mg/dL — ABNORMAL HIGH (ref 70–99)

## 2011-08-10 LAB — CBC WITH DIFFERENTIAL/PLATELET
Eosinophils Absolute: 0.3 10*3/uL (ref 0.0–0.7)
Eosinophils Relative: 1 % (ref 0–5)
HCT: 42.3 % (ref 39.0–52.0)
Hemoglobin: 13.7 g/dL (ref 13.0–17.0)
Lymphocytes Relative: 10 % — ABNORMAL LOW (ref 12–46)
Lymphs Abs: 2.2 10*3/uL (ref 0.7–4.0)
MCH: 32.1 pg (ref 26.0–34.0)
MCV: 99.1 fL (ref 78.0–100.0)
Monocytes Absolute: 1.4 10*3/uL — ABNORMAL HIGH (ref 0.1–1.0)
Monocytes Relative: 6 % (ref 3–12)
RBC: 4.27 MIL/uL (ref 4.22–5.81)
WBC: 23.1 10*3/uL — ABNORMAL HIGH (ref 4.0–10.5)

## 2011-08-10 LAB — BASIC METABOLIC PANEL
BUN: 18 mg/dL (ref 6–23)
CO2: 26 mEq/L (ref 19–32)
Calcium: 9 mg/dL (ref 8.4–10.5)
GFR calc non Af Amer: >90 mL/min (ref >90)
Glucose, Bld: 124 mg/dL — ABNORMAL HIGH (ref 70–99)
Sodium: 141 mEq/L (ref 135–145)

## 2011-08-10 LAB — CULTURE, RESPIRATORY W GRAM STAIN

## 2011-08-10 MED ORDER — PANTOPRAZOLE SODIUM 40 MG PO PACK
40.0000 mg | PACK | Freq: Every day | ORAL | Status: DC
  Administered 2011-08-11: 40 mg
  Filled 2011-08-10 (×3): qty 20

## 2011-08-10 NOTE — Progress Notes (Signed)
Patient ID: Jeffrey Sweeney, male   DOB: [birth year], 66 y.o.   MRN: [id number] Follow up - Trauma Critical Care  Patient Details:    Jeffrey Sweeney is an 66 y.o. male.  Lines/tubes : Airway 8 mm (Active)  Secured at (cm) 23 cm 08/08/2011  8:05 AM  Measured From Lips 08/08/2011  8:05 AM  Secured Location Center 08/08/2011  8:05 AM  Secured By [employer] 08/08/2011  8:05 AM  Tube Holder Repositioned Yes 08/08/2011  8:05 AM  Site Condition Dry 08/08/2011  8:05 AM                                              Urethral Catheter Straight-tip 16 Fr. (Active)  Indication for Insertion or Continuance of Catheter Physician order 08/08/2011 12:00 AM  Site Assessment Clean;Intact 08/08/2011 12:00 AM  Collection Container Standard drainage bag 08/08/2011 12:00 AM  Securement Method Leg strap 08/08/2011 12:00 AM  Urinary Catheter Interventions Unclamped 08/08/2011 12:00 AM  Output (mL) 25 mL 08/08/2011  7:00 AM    Microbiology/Sepsis markers: Results for orders placed during the hospital encounter of 08/07/11  MRSA PCR SCREENING     Status: Abnormal   Collection Time   08/07/11 10:31 PM      Component Value Range Status Comment   MRSA by PCR POSITIVE (*) NEGATIVE Final   CULTURE, RESPIRATORY     Status: Normal (Preliminary result)   Collection Time   08/07/11 11:47 PM      Component Value Range Status Comment   Specimen Description TRACHEAL ASPIRATE   Final    Special Requests NONE   Final    Gram Stain     Final    Value: FEW WBC PRESENT,BOTH PMN AND MONONUCLEAR     NO SQUAMOUS EPITHELIAL CELLS SEEN     NO ORGANISMS SEEN   Culture Culture reincubated for better growth   Final    Report Status PENDING   Incomplete   CULTURE, RESPIRATORY     Status: Normal (Preliminary result)   Collection Time   08/09/11  1:00 PM      Component Value Range Status Comment   Specimen Description TRACHEAL ASPIRATE   Final    Special Requests NONE   Final    Gram Stain     Final    Value: FEW WBC  PRESENT,BOTH PMN AND MONONUCLEAR     NO SQUAMOUS EPITHELIAL CELLS SEEN     NO ORGANISMS SEEN   Culture PENDING   Incomplete    Report Status PENDING   Incomplete     Anti-infectives:  Anti-infectives     Start     Dose/Rate Route Frequency Ordered Stop   08/08/11 2300   vancomycin (VANCOCIN) IVPB 1000 mg/200 mL premix        1,000 mg 200 mL/hr over 60 Minutes Intravenous Every 12 hours 08/08/11 0954     08/08/11 1100   vancomycin (VANCOCIN) 2,500 mg in sodium chloride 0.9 % 500 mL IVPB        2,500 mg 250 mL/hr over 120 Minutes Intravenous  Once 08/08/11 0954 08/08/11 1443   08/08/11 1000   moxifloxacin (AVELOX) IVPB 400 mg        400 mg 250 mL/hr over 60 Minutes Intravenous Every 24 hours 08/08/11 0847 08/18/11 0959          Best  Practice/Protocols:  VTE Prophylaxis: Lovenox (prophylaxtic dose) Continous Sedation  Consults:      Studies: Dg Ribs Unilateral W/chest Left  08/07/2011  *RADIOLOGY REPORT*  Clinical Data: Fall today.  Left rib pain.  Flank pain.  LEFT RIBS AND CHEST - 3+ VIEW  Comparison: Portable chest 02/09/2011.  Findings: Heart is mildly enlarged.  A prominent left pleural effusion is present.  Multiple left-sided rib fractures involve the 7th through 11th ribs laterally and anteriorly.  There is no definite pneumothorax.  Limited imaging of the upper abdomen demonstrates contrast within the left renal collecting system secondary to the patient's CT scan.  IMPRESSION:  1.  Left-sided 7th through 11th rib fractures. 2.  Left pleural effusion and airspace disease likely reflects a hemothorax and atelectasis or pulmonary contusion.  Original Report Authenticated By: Jamesetta Orleans. MATTERN, M.D.   Ct Head Wo Contrast  08/07/2011  *RADIOLOGY REPORT*  Clinical Data: Fall with history of stroke affecting left arm.  CT HEAD WITHOUT CONTRAST  Technique:  Contiguous axial images were obtained from the base of the skull through the vertex without contrast.  Comparison:  02/09/2011  Findings: Bone windows demonstrate no significant soft tissue swelling.  Mucous retention cyst or polyp in the right maxillary sinus.  There are also more cephalad secretions in this area. Persistent ethmoid air cell mucosal thickening.  Clear mastoid air cells.  Soft tissue windows demonstrate redemonstration of encephalomalacia involving the right parietal and occipital lobes.  Resultant ex vacuo dilatation of the right lateral ventricle.  Mild low density in the periventricular white matter likely related to small vessel disease.Mild extension of encephalomalacia in the right temporal lobe which is unchanged.  No  mass lesion, hemorrhage, hydrocephalus, acute infarct, intra- axial, or extra-axial fluid collection.  IMPRESSION:  1.  No acute or post-traumatic deformity identified. 2.  Remote right-sided hemispheric infarct. 3.  Mild small vessel ischemic change. 4.  Sinus disease.  Original Report Authenticated By: Consuello Bossier, M.D.   Ct Chest W Contrast  08/07/2011  *RADIOLOGY REPORT*  Clinical Data:  The patient fell at home.  Neck pain.  Left-sided rib fractures.  Pneumothorax.  New left chest tube.  CT CHEST WITH CONTRAST  Technique:  Multidetector CT imaging of the chest was performed following the standard protocol during bolus administration of intravenous contrast.  Contrast: OMNIPAQUE IOHEXOL 300 MG/ML  SOLN  Comparison: .Multiple prior studies from 08/07/2011  Findings: The patient has a left-sided chest tube, tip in the region of the left lung apex.  Small left pneumothorax persists. There is consolidation of collapsed lung on the left.  No evidence for pleural effusion.  Numerous left-sided rib fractures are present, involving at least ribs 07/09/2008.  The 11th rib is not completely imaged.  The right lung is well-aerated.  The heart size is normal.  No evidence for mediastinal injury. The visualized portion of the thyroid gland has a normal appearance.  Images of the upper  abdomen are unremarkable.  IMPRESSION:  1.  Multiple left-sided rib fractures. 2.  Small left residual pneumothorax. 3.  Consolidation and atelectasis of the left lung.  Original Report Authenticated By: Patterson Hammersmith, M.D.   Ct Cervical Spine Wo Contrast  08/07/2011  *RADIOLOGY REPORT*  Clinical Data:  Trauma.  The patient fell at home.  CT CERVICAL SPINE WITHOUT CONTRAST  Technique:  Multidetector CT imaging of the cervical spine was performed without intravenous contrast.  Multiplanar CT image reconstructions were also generated.  Comparison:  Multiple  prior CT exams of the head, most recent 08/07/2011  Findings:  There is moderate degenerative change throughout the cervical spine.  No evidence for acute fracture or subluxation. Left-sided chest tube is imaged at the left lung apex.  Left pneumothorax is present.  IMPRESSION:  1.  Degenerative changes. 2. No evidence for acute osseous abnormality. 3.  Left pneumothorax and chest tube.  Original Report Authenticated By: Patterson Hammersmith, M.D.   Ct Abdomen Pelvis W Contrast  08/07/2011  *RADIOLOGY REPORT*  Clinical Data: status post fall.  CT ABDOMEN AND PELVIS WITH CONTRAST  Technique:  Multidetector CT imaging of the abdomen and pelvis was performed following the standard protocol during bolus administration of intravenous contrast.  Contrast: OMNIPAQUE IOHEXOL 300 MG/ML  SOLN  Comparison: 02/18/2010  Findings: Atelectasis and consolidation involving the left lung with associated left lung volume loss is identified.  This is a new finding since 02/08/2010.  No pericardial or pleural effusion identified.  No focal liver abnormality.  The gallbladder is normal.  No biliary dilatation.  The pancreas is unremarkable.  Similar appearance of the spleen with deformity of the anterior portion of the splenic parenchyma.  Query prior infarct or trauma.  Both adrenal glands are normal.  Bilateral renal cysts are noted. There is a nonobstructing calculi  within the upper pole the right kidney measuring 2-3 mm, image 41.  Unchanged from previous exam. The urinary bladder appears normal.  There is no adenopathy within the upper abdomen.  No pelvic or inguinal adenopathy identified.  There is no free fluid or fluid collections within the abdomen or pelvis.  Review of the visualized bony structures is significant for acute left 8th through 12th rib fracture deformities.  No right rib fracture deformities.  There is a curvature of the thoracic and lumbar spine which is convex to the right.  Mild multilevel degenerative disc disease is noted.  The vertebral body heights are well maintained.  IMPRESSION:  1.  Multiple acute left sided rib fractures. 2.  Atelectasis and consolidation involving the left midlung and left base.  Cannot rule out pulmonary contusion, pneumonia or aspiration.  There is associated volume loss of the left hemithorax.  Suggest further evaluation with CT of the chest 3.  No acute findings within the abdomen or pelvis.  Original Report Authenticated By: Rosealee Albee, M.D.   Dg Chest Port 1 View  08/08/2011  *RADIOLOGY REPORT*  Clinical Data: Post intubation.  PORTABLE CHEST - 1 VIEW  Comparison: 08/07/2011  Findings: Endotracheal tube tip positioned 1.8 cm proximal to the carina.  Prominent cardiomediastinal contours. Left-sided chest tube in place.  Retrocardiac consolidation.  Tiny left apical pneumothorax is suggested.  There are multiple left lower lateral rib fractures.  Right lung is predominately clear. Small amount of subcutaneous gas along the left hemithorax.  IMPRESSION: Endotracheal tube tip 1.8 cm proximal to the carina.  Retrocardiac opacity may reflect atelectasis or aspiration.  Left-sided chest tube with suggestion of tiny apical pneumothorax.  Multiple left lateral rib fractures.  Original Report Authenticated By: Waneta Martins, M.D.   Dg Chest Portable 1 View  08/07/2011  *RADIOLOGY REPORT*  Clinical Data: Fall.  Short  of breath.  Pneumonia.  PORTABLE CHEST - 1 VIEW  Comparison: 08/07/2011.  Findings: Worsening left-sided pulmonary aeration suggesting increasing pleural fluid and in the setting of trauma increasing left hemothorax.  Increasing atelectasis in the right lung.  There is probably also superimposed interstitial pulmonary edema.  The cardiopericardial silhouette  is partially obscured.  IMPRESSION: Increasing opacification of the left hemithorax, compatible with increasing hemothorax in the setting of trauma.  Only a small portion of the left upper lobe remains aerated.  Original Report Authenticated By: Andreas Newport, M.D.     Events:  Subjective:    Overnight Issues: ETT cuff leak, better now; o/w no issues. Wbc still increasing but no fever.   Objective:  Vital signs for last 24 hours: Temp:  [98.1 F (36.7 C)-100.3 F (37.9 C)] 98.1 F (36.7 C) (08/03 0753) Pulse Rate:  [76-107] 86  (08/03 0801) Resp:  [11-24] 18  (08/03 0801) BP: (106-137)/(54-72) 107/58 mmHg (08/03 0801) SpO2:  [93 %-99 %] 99 % (08/03 0801) FiO2 (%):  [39.7 %-40.3 %] 40 % (08/03 0801) Weight:  [210 lb 1.6 oz (95.3 kg)] 210 lb 1.6 oz (95.3 kg) (08/03 0438)  Hemodynamic parameters for last 24 hours:    Intake/Output from previous day: 08/02 0701 - 08/03 0700 In: 5405.3 [I.V.:2895.3; NG/GT:1220; IV Piggyback:1290] Out: 1735 [Urine:1730; Chest Tube:5]  Intake/Output this shift:    Vent settings for last 24 hours: Vent Mode:  [-] PRVC FiO2 (%):  [39.7 %-40.3 %] 40 % Set Rate:  [14 bmp] 14 bmp Vt Set:  [550 mL] 550 mL PEEP:  [5 cmH20] 5 cmH20 Plateau Pressure:  [21 cmH20-25 cmH20] 22 cmH20  Physical Exam:  Intubated, sedated. OE spont. Will occasionally move/thrash UE. No FC Coarse BS on left, clearer on rt Reg Soft, nd, +BS +SCDs, no edema. Palp PT b/l  Results for orders placed during the hospital encounter of 08/07/11 (from the past 24 hour(s))  POCT I-STAT 3, BLOOD GAS (G3+)     Status: Abnormal    Collection Time   08/09/11  9:20 AM      Component Value Range   pH, Arterial 7.278 (*) 7.350 - 7.450   pCO2 arterial 52.2 (*) 35.0 - 45.0 mmHg   pO2, Arterial 82.0  80.0 - 100.0 mmHg   Bicarbonate 24.3 (*) 20.0 - 24.0 mEq/L   TCO2 26  0 - 100 mmol/L   O2 Saturation 94.0     Acid-base deficit 3.0 (*) 0.0 - 2.0 mmol/L   Patient temperature 99.5 F     Sample type ARTERIAL    CULTURE, RESPIRATORY     Status: Normal (Preliminary result)   Collection Time   08/09/11  1:00 PM      Component Value Range   Specimen Description TRACHEAL ASPIRATE     Special Requests NONE     Gram Stain       Value: FEW WBC PRESENT,BOTH PMN AND MONONUCLEAR     NO SQUAMOUS EPITHELIAL CELLS SEEN     NO ORGANISMS SEEN   Culture PENDING     Report Status PENDING    GLUCOSE, CAPILLARY     Status: Abnormal   Collection Time   08/09/11  8:09 PM      Component Value Range   Glucose-Capillary 132 (*) 70 - 99 mg/dL   Comment 1 Documented in Chart     Comment 2 Notify RN    GLUCOSE, CAPILLARY     Status: Abnormal   Collection Time   08/10/11 12:22 AM      Component Value Range   Glucose-Capillary 139 (*) 70 - 99 mg/dL  BLOOD GAS, ARTERIAL     Status: Abnormal   Collection Time   08/10/11  4:38 AM      Component Value Range   FIO2 0.40  Delivery systems VENTILATOR     Mode PRESSURE REGULATED VOLUME CONTROL     VT 550     Rate 14     Peep/cpap 5.0     pH, Arterial 7.306 (*) 7.350 - 7.450   pCO2 arterial 52.4 (*) 35.0 - 45.0 mmHg   pO2, Arterial 82.8  80.0 - 100.0 mmHg   Bicarbonate 25.4 (*) 20.0 - 24.0 mEq/L   TCO2 27.0  0 - 100 mmol/L   Acid-base deficit 0.1  0.0 - 2.0 mmol/L   O2 Saturation 95.6     Patient temperature 98.6     Collection site LEFT RADIAL     Drawn by [phone]     Sample type ARTERIAL DRAW     Allens test (pass/fail) PASS  PASS  GLUCOSE, CAPILLARY     Status: Abnormal   Collection Time   08/10/11  4:53 AM      Component Value Range   Glucose-Capillary 101 (*) 70 - 99 mg/dL   Comment 1  Notify RN    CBC WITH DIFFERENTIAL     Status: Abnormal   Collection Time   08/10/11  4:55 AM      Component Value Range   WBC 23.1 (*) 4.0 - 10.5 K/uL   RBC 4.27  4.22 - 5.81 MIL/uL   Hemoglobin 13.7  13.0 - 17.0 g/dL   HCT 52.8  41.3 - 24.4 %   MCV 99.1  78.0 - 100.0 fL   MCH 32.1  26.0 - 34.0 pg   MCHC 32.4  30.0 - 36.0 g/dL   RDW 01.0  27.2 - 53.6 %   Platelets 181  150 - 400 K/uL   Neutrophils Relative 83 (*) 43 - 77 %   Neutro Abs 19.2 (*) 1.7 - 7.7 K/uL   Lymphocytes Relative 10 (*) 12 - 46 %   Lymphs Abs 2.2  0.7 - 4.0 K/uL   Monocytes Relative 6  3 - 12 %   Monocytes Absolute 1.4 (*) 0.1 - 1.0 K/uL   Eosinophils Relative 1  0 - 5 %   Eosinophils Absolute 0.3  0.0 - 0.7 K/uL   Basophils Relative 0  0 - 1 %   Basophils Absolute 0.0  0.0 - 0.1 K/uL  BASIC METABOLIC PANEL     Status: Abnormal   Collection Time   08/10/11  4:55 AM      Component Value Range   Sodium 141  135 - 145 mEq/L   Potassium 4.7  3.5 - 5.1 mEq/L   Chloride 108  96 - 112 mEq/L   CO2 26  19 - 32 mEq/L   Glucose, Bld 124 (*) 70 - 99 mg/dL   BUN 18  6 - 23 mg/dL   Creatinine, Ser 6.44  0.50 - 1.35 mg/dL   Calcium 9.0  8.4 - 03.4 mg/dL   GFR calc non Af Amer >90  >90 mL/min   GFR calc Af Amer >90  >90 mL/min  GLUCOSE, CAPILLARY     Status: Abnormal   Collection Time   08/10/11  7:28 AM      Component Value Range   Glucose-Capillary 110 (*) 70 - 99 mg/dL    Assessment & Plan: Present on Admission:  .SYNCOPE .Seizure disorder   LOS: 3 days   Additional comments:I reviewed the patient's new clinical lab test results. and radiologic studies Fall L rib Fx 7-11 with significant ATX/secretions - CXR looks a little worse today on left after CT removal.  Some airspace disease on left.  VDRF due to above -cont to wean today. Will check SBT. Hopeful, extubation Sun am ID - resp Cx NTD; WBC continue to increase despite  empiric Avelox and Vanc (MRSA screen +) for PNA; will check UA and blood cultures. No  obvious source FEN - TF at goal, no residuals, lytes OK Sz disorder - Keppra and valproic acid Hx HTN - BP nml VTE - continue Lovenox Critical Care Total Time*: 28 Minutes  Trey Bebee M. Andrey Campanile, MD, FACS General, Bariatric, & Minimally Invasive Surgery [facility], [place]  08/10/2011  *Care during the described time interval was provided by me and/or other providers on the critical care team.  I have reviewed this patient's available data, including medical history, events of note, physical examination and test results as part of my evaluation.

## 2011-08-10 NOTE — Progress Notes (Signed)
eLink Physician-Brief Progress Note Patient Name: Jeffrey Sweeney DOB: 05-Mar-1945 MRN: [id number]  Date of Service  08/10/2011   HPI/Events of Note  RT called due to concern regarding ETT cuff leak. The inspiratory tidal vol is 800 ml and exp is 100 ml. Noisy sound around the ETT in the neck.   Pt. Has good oxygenation as evidenced by 97% sats on 40% ra. Hemodynamically stable. Pneumothorax resolving.    eICU Interventions  - Pt. Is alert, following commands. - Will do SBT (PS 5/5) - Will check gas in 30 mins. - Currently he is pulling 1L tidal volume and RR 24/min. - If continues to tolerate SBT and if the gas exchange is good as evidenced by ABG then we will extubate him.      Catha Brow 08/10/2011, 3:05 AM

## 2011-08-10 NOTE — Progress Notes (Signed)
RT attempted wean again. Pt would take 2-3 breaths and then have periods of apnea. Back up ventilation started two separate times, and pt continued to have periods of apnea. Pt follows commands, and appears to understand directions, and initiates breaths when reminded. RT attempted pt on 5/10 and 5/14 with no success. RT will check back and reassess ability to wean.

## 2011-08-10 NOTE — Progress Notes (Signed)
RT attempted wean again. Pt had several periods of apnea, so RT increased PS to 10. Pt still had periods of apnea, but when a breath was initiated, pt was getting around 900 Vt. Pt placed back on full support, RT will attempt wean again in one hour. RT will continue to monitor.

## 2011-08-11 ENCOUNTER — Inpatient Hospital Stay (HOSPITAL_COMMUNITY): Payer: Medicare Other

## 2011-08-11 LAB — GLUCOSE, CAPILLARY
Glucose-Capillary: 106 mg/dL — ABNORMAL HIGH (ref 70–99)
Glucose-Capillary: 109 mg/dL — ABNORMAL HIGH (ref 70–99)
Glucose-Capillary: 115 mg/dL — ABNORMAL HIGH (ref 70–99)
Glucose-Capillary: 94 mg/dL (ref 70–99)
Glucose-Capillary: 99 mg/dL (ref 70–99)

## 2011-08-11 LAB — BLOOD GAS, ARTERIAL
Acid-Base Excess: 2.2 mmol/L — ABNORMAL HIGH (ref 0.0–2.0)
Acid-Base Excess: 2.3 mmol/L — ABNORMAL HIGH (ref 0.0–2.0)
Bicarbonate: 26.9 mEq/L — ABNORMAL HIGH (ref 20.0–24.0)
Drawn by: 347641
FIO2: 0.4 %
FIO2: 0.4 %
MECHVT: 550 mL
Patient temperature: 99.3
RATE: 14 resp/min
TCO2: 28.3 mmol/L (ref 0–100)
pCO2 arterial: 45.7 mmHg — ABNORMAL HIGH (ref 35.0–45.0)
pH, Arterial: 7.385 (ref 7.350–7.450)
pO2, Arterial: 56.1 mmHg — ABNORMAL LOW (ref 80.0–100.0)

## 2011-08-11 LAB — CBC WITH DIFFERENTIAL/PLATELET
Eosinophils Absolute: 0.4 10*3/uL (ref 0.0–0.7)
Eosinophils Relative: 3 % (ref 0–5)
HCT: 35.6 % — ABNORMAL LOW (ref 39.0–52.0)
Lymphs Abs: 1.6 10*3/uL (ref 0.7–4.0)
MCH: 31.4 pg (ref 26.0–34.0)
MCV: 97.3 fL (ref 78.0–100.0)
Monocytes Absolute: 1 10*3/uL (ref 0.1–1.0)
Platelets: 163 10*3/uL (ref 150–400)
RBC: 3.66 MIL/uL — ABNORMAL LOW (ref 4.22–5.81)
RDW: 14.8 % (ref 11.5–15.5)

## 2011-08-11 LAB — BASIC METABOLIC PANEL
CO2: 24 mEq/L (ref 19–32)
CO2: 28 mEq/L (ref 19–32)
Calcium: 7.5 mg/dL — ABNORMAL LOW (ref 8.4–10.5)
Calcium: 8.7 mg/dL (ref 8.4–10.5)
Chloride: 107 mEq/L (ref 96–112)
Creatinine, Ser: 0.53 mg/dL (ref 0.50–1.35)
Creatinine, Ser: 0.6 mg/dL (ref 0.50–1.35)
Glucose, Bld: 114 mg/dL — ABNORMAL HIGH (ref 70–99)
Glucose, Bld: 111 mg/dL — ABNORMAL HIGH (ref 70–99)

## 2011-08-11 LAB — CULTURE, RESPIRATORY W GRAM STAIN: Culture: NO GROWTH

## 2011-08-11 MED ORDER — FUROSEMIDE 10 MG/ML IJ SOLN
40.0000 mg | Freq: Four times a day (QID) | INTRAMUSCULAR | Status: AC
  Administered 2011-08-11: 40 mg via INTRAVENOUS
  Filled 2011-08-11: qty 4

## 2011-08-11 MED ORDER — FUROSEMIDE 10 MG/ML IJ SOLN
40.0000 mg | Freq: Once | INTRAMUSCULAR | Status: AC
  Administered 2011-08-11: 40 mg via INTRAVENOUS

## 2011-08-11 MED ORDER — FUROSEMIDE 10 MG/ML IJ SOLN
INTRAMUSCULAR | Status: AC
  Administered 2011-08-11: 40 mg
  Filled 2011-08-11: qty 4

## 2011-08-11 NOTE — Progress Notes (Signed)
Follow up - Trauma and Critical Care  Patient Details:    Jeffrey Sweeney is an 66 y.o. male.  Lines/tubes : Airway 8 mm (Active)  Secured at (cm) 24 cm 08/11/2011  3:02 AM  Measured From Lips 08/11/2011  3:02 AM  Secured Location Left 08/11/2011  3:02 AM  Secured By [employer] 08/11/2011  3:02 AM  Tube Holder Repositioned Yes 08/11/2011  3:02 AM  Cuff Pressure (cm H2O) 24 cm H2O 08/10/2011  8:33 PM  Site Condition Dry 08/11/2011  3:02 AM     PICC Triple Lumen 08/08/11 PICC Right Basilic (Active)  Indication for Insertion or Continuance of Line Prolonged intravenous therapies 08/10/2011  8:00 PM  Site Assessment Clean;Dry;Intact 08/10/2011  8:00 PM  Lumen #1 Status Flushed 08/10/2011 10:00 PM  Lumen #2 Status Flushed 08/10/2011 10:00 PM  Lumen #3 Status Flushed 08/10/2011 10:00 PM  Dressing Type Transparent 08/10/2011  8:00 PM  Dressing Status Clean;Dry;Intact;Antimicrobial disc in place 08/10/2011  8:00 PM  Line Care Connections checked and tightened 08/10/2011  8:00 PM     NG/OG Tube Orogastric (Active)  Placement Verification Auscultation 08/10/2011  8:25 PM  Site Assessment Clean;Dry;Intact 08/10/2011  8:25 PM  Status Infusing tube feed 08/10/2011  8:25 PM  Drainage Appearance None 08/10/2011  8:00 AM  Gastric Residual 0 mL 08/11/2011  4:00 AM  Intake (mL) 25 mL 08/11/2011  7:00 AM     Urethral Catheter Straight-tip 16 Fr. (Active)  Indication for Insertion or Continuance of Catheter Urinary output monitoring;Physician order 08/10/2011  8:25 PM  Site Assessment Clean;Intact 08/10/2011  8:25 PM  Collection Container Standard drainage bag 08/10/2011  8:25 PM  Securement Method Leg strap 08/10/2011  8:25 PM  Urinary Catheter Interventions Unclamped 08/08/2011  8:00 PM  Output (mL) 195 mL 08/11/2011  6:00 AM    Microbiology/Sepsis markers: Results for orders placed during the hospital encounter of 08/07/11  MRSA PCR SCREENING     Status: Abnormal   Collection Time   08/07/11 10:31 PM      Component Value Range  Status Comment   MRSA by PCR POSITIVE (*) NEGATIVE Final   CULTURE, RESPIRATORY     Status: Normal   Collection Time   08/07/11 11:47 PM      Component Value Range Status Comment   Specimen Description TRACHEAL ASPIRATE   Final    Special Requests NONE   Final    Gram Stain     Final    Value: FEW WBC PRESENT,BOTH PMN AND MONONUCLEAR     NO SQUAMOUS EPITHELIAL CELLS SEEN     NO ORGANISMS SEEN   Culture     Final    Value: MODERATE MORAXELLA CATARRHALIS(BRANHAMELLA)     Note: BETA LACTAMASE POSITIVE   Report Status 08/10/2011 FINAL   Final   CULTURE, RESPIRATORY     Status: Normal (Preliminary result)   Collection Time   08/09/11  1:00 PM      Component Value Range Status Comment   Specimen Description TRACHEAL ASPIRATE   Final    Special Requests NONE   Final    Gram Stain     Final    Value: FEW WBC PRESENT,BOTH PMN AND MONONUCLEAR     NO SQUAMOUS EPITHELIAL CELLS SEEN     NO ORGANISMS SEEN   Culture NO GROWTH 1 DAY   Final    Report Status PENDING   Incomplete     Anti-infectives:  Anti-infectives     Start  Dose/Rate Route Frequency Ordered Stop   08/08/11 2300   vancomycin (VANCOCIN) IVPB 1000 mg/200 mL premix        1,000 mg 200 mL/hr over 60 Minutes Intravenous Every 12 hours 08/08/11 0954     08/08/11 1100   vancomycin (VANCOCIN) 2,500 mg in sodium chloride 0.9 % 500 mL IVPB        2,500 mg 250 mL/hr over 120 Minutes Intravenous  Once 08/08/11 0954 08/08/11 1443   08/08/11 1000   moxifloxacin (AVELOX) IVPB 400 mg        400 mg 250 mL/hr over 60 Minutes Intravenous Every 24 hours 08/08/11 0847 08/18/11 0959          Best Practice/Protocols:  VTE Prophylaxis: Lovenox (prophylaxtic dose) and Mechanical GI Prophylaxis: Proton Pump Inhibitor All sedation is off.  Patietnt tolerating this well  Consults:      Events:  Subjective:    Overnight Issues: Weaned very well yesterday on 5/5.  Secretions are moderate and frothy.  Saliva like  Objective:    Vital signs for last 24 hours: Temp:  [97.3 F (36.3 C)-99.3 F (37.4 C)] 99.3 F (37.4 C) (08/04 0743) Pulse Rate:  [74-103] 83  (08/04 0700) Resp:  [8-23] 15  (08/04 0700) BP: (109-139)/(49-66) 123/66 mmHg (08/04 0700) SpO2:  [92 %-100 %] 95 % (08/04 0700) FiO2 (%):  [39.7 %-86.1 %] 40.1 % (08/04 0700) Weight:  [100 kg (220 lb 7.4 oz)] 100 kg (220 lb 7.4 oz) (08/04 0600)  Hemodynamic parameters for last 24 hours:    Intake/Output from previous day: 08/03 0701 - 08/04 0700 In: 4095 [I.V.:2400; NG/GT:715; IV Piggyback:980] Out: 2450 [Urine:2450]  Intake/Output this shift:    Vent settings for last 24 hours: Vent Mode:  [-] PRVC FiO2 (%):  [39.7 %-86.1 %] 40.1 % Set Rate:  [14 bmp] 14 bmp Vt Set:  [550 mL] 550 mL PEEP:  [5 cmH20] 5 cmH20 Pressure Support:  [5 cmH20-10 cmH20] 5 cmH20 Plateau Pressure:  [14 cmH20-16 cmH20] 15 cmH20  Physical Exam:  General: alert and no respiratory distress Neuro: alert, oriented and nonfocal exam Resp: rhonchi bilaterally GI: soft, nontender, BS WNL, no r/g Extremities: no edema, no erythema, pulses WNL  Results for orders placed during the hospital encounter of 08/07/11 (from the past 24 hour(s))  GLUCOSE, CAPILLARY     Status: Abnormal   Collection Time   08/10/11 11:59 AM      Component Value Range   Glucose-Capillary 114 (*) 70 - 99 mg/dL  GLUCOSE, CAPILLARY     Status: Abnormal   Collection Time   08/10/11  3:41 PM      Component Value Range   Glucose-Capillary 100 (*) 70 - 99 mg/dL  URINALYSIS, ROUTINE W REFLEX MICROSCOPIC     Status: Abnormal   Collection Time   08/10/11  5:31 PM      Component Value Range   Color, Urine YELLOW  YELLOW   APPearance CLEAR  CLEAR   Specific Gravity, Urine 1.022  1.005 - 1.030   pH 5.5  5.0 - 8.0   Glucose, UA NEGATIVE  NEGATIVE mg/dL   Hgb urine dipstick NEGATIVE  NEGATIVE   Bilirubin Urine NEGATIVE  NEGATIVE   Ketones, ur NEGATIVE  NEGATIVE mg/dL   Protein, ur NEGATIVE  NEGATIVE mg/dL    Urobilinogen, UA 2.0 (*) 0.0 - 1.0 mg/dL   Nitrite NEGATIVE  NEGATIVE   Leukocytes, UA NEGATIVE  NEGATIVE  GLUCOSE, CAPILLARY     Status: Abnormal  Collection Time   08/10/11  8:01 PM      Component Value Range   Glucose-Capillary 123 (*) 70 - 99 mg/dL   Comment 1 Documented in Chart     Comment 2 Notify RN    GLUCOSE, CAPILLARY     Status: Abnormal   Collection Time   08/10/11 11:49 PM      Component Value Range   Glucose-Capillary 137 (*) 70 - 99 mg/dL   Comment 1 Documented in Chart     Comment 2 Notify RN    GLUCOSE, CAPILLARY     Status: Abnormal   Collection Time   08/11/11  3:30 AM      Component Value Range   Glucose-Capillary 108 (*) 70 - 99 mg/dL   Comment 1 Documented in Chart     Comment 2 Notify RN    BASIC METABOLIC PANEL     Status: Abnormal   Collection Time   08/11/11  4:36 AM      Component Value Range   Sodium 141  135 - 145 mEq/L   Potassium 6.1 (*) 3.5 - 5.1 mEq/L   Chloride 113 (*) 96 - 112 mEq/L   CO2 24  19 - 32 mEq/L   Glucose, Bld 114 (*) 70 - 99 mg/dL   BUN 17  6 - 23 mg/dL   Creatinine, Ser 9.60  0.50 - 1.35 mg/dL   Calcium 7.5 (*) 8.4 - 10.5 mg/dL   GFR calc non Af Amer >90  >90 mL/min   GFR calc Af Amer >90  >90 mL/min  CBC WITH DIFFERENTIAL     Status: Abnormal   Collection Time   08/11/11  4:36 AM      Component Value Range   WBC 15.4 (*) 4.0 - 10.5 K/uL   RBC 3.66 (*) 4.22 - 5.81 MIL/uL   Hemoglobin 11.5 (*) 13.0 - 17.0 g/dL   HCT 45.4 (*) 09.8 - 11.9 %   MCV 97.3  78.0 - 100.0 fL   MCH 31.4  26.0 - 34.0 pg   MCHC 32.3  30.0 - 36.0 g/dL   RDW 14.7  82.9 - 56.2 %   Platelets 163  150 - 400 K/uL   Neutrophils Relative 80 (*) 43 - 77 %   Neutro Abs 12.4 (*) 1.7 - 7.7 K/uL   Lymphocytes Relative 10 (*) 12 - 46 %   Lymphs Abs 1.6  0.7 - 4.0 K/uL   Monocytes Relative 7  3 - 12 %   Monocytes Absolute 1.0  0.1 - 1.0 K/uL   Eosinophils Relative 3  0 - 5 %   Eosinophils Absolute 0.4  0.0 - 0.7 K/uL   Basophils Relative 0  0 - 1 %   Basophils  Absolute 0.0  0.0 - 0.1 K/uL  BLOOD GAS, ARTERIAL     Status: Abnormal   Collection Time   08/11/11  4:36 AM      Component Value Range   FIO2 0.40     Delivery systems VENTILATOR     Mode PRESSURE REGULATED VOLUME CONTROL     VT 550     Rate 14     Peep/cpap 5.0     pH, Arterial 7.385  7.350 - 7.450   pCO2 arterial 45.7 (*) 35.0 - 45.0 mmHg   pO2, Arterial 56.1 (*) 80.0 - 100.0 mmHg   Bicarbonate 26.7 (*) 20.0 - 24.0 mEq/L   TCO2 28.1  0 - 100 mmol/L   Acid-Base Excess 2.2 (*)  0.0 - 2.0 mmol/L   O2 Saturation 89.8     Patient temperature 98.6     Collection site LEFT RADIAL     Drawn by [id number]     Sample type ARTERIAL DRAW     Allens test (pass/fail) PASS  PASS  BASIC METABOLIC PANEL     Status: Abnormal   Collection Time   08/11/11  6:10 AM      Component Value Range   Sodium 140  135 - 145 mEq/L   Potassium 3.9  3.5 - 5.1 mEq/L   Chloride 107  96 - 112 mEq/L   CO2 28  19 - 32 mEq/L   Glucose, Bld 111 (*) 70 - 99 mg/dL   BUN 19  6 - 23 mg/dL   Creatinine, Ser 0.98  0.50 - 1.35 mg/dL   Calcium 8.7  8.4 - 11.9 mg/dL   GFR calc non Af Amer >90  >90 mL/min   GFR calc Af Amer >90  >90 mL/min  GLUCOSE, CAPILLARY     Status: Abnormal   Collection Time   08/11/11  7:15 AM      Component Value Range   Glucose-Capillary 109 (*) 70 - 99 mg/dL  BLOOD GAS, ARTERIAL     Status: Abnormal   Collection Time   08/11/11  7:30 AM      Component Value Range   FIO2 0.40     Delivery systems VENTILATOR     Mode PRESSURE REGULATED VOLUME CONTROL     VT 550     Rate 14     Peep/cpap 8.0     pH, Arterial 7.380  7.350 - 7.450   pCO2 arterial 46.7 (*) 35.0 - 45.0 mmHg   pO2, Arterial 68.9 (*) 80.0 - 100.0 mmHg   Bicarbonate 26.9 (*) 20.0 - 24.0 mEq/L   TCO2 28.3  0 - 100 mmol/L   Acid-Base Excess 2.3 (*) 0.0 - 2.0 mmol/L   O2 Saturation 95.2     Patient temperature 99.3     Collection site LEFT RADIAL     Drawn by [id number]     Sample type ARTERIAL DRAW     Allens test (pass/fail) PASS   PASS     Assessment/Plan:   NEURO  Oriented off of sedation   Plan: CPM  PULM  Atelectasis/collapse (diffuse )   Plan: Lasix  CARDIO  No issues currently   Plan: CPM  RENAL  Urine output is good, but he is still + on fluid by about 10 liters since admission.   Plan: Lasix 40mg   GI  tolerating tube feedings.   Plan: CPM  ID  Pneumonia (hospital acquired (not ventilator-associated) Moraxella which should be covered by Avelox)   Plan: CPM  HEME  Anemia acute blood loss anemia)   Plan: Continue antibiotics  ENDO No specific issues   Plan: CPM  Global Issues  The patient weaned very well.  Is getting bronchodilators.  Oxygenation not great, but the patient is a chronic smoker and probably does not have normal parameters.  Will give Lasix to offset some of his fluid overload, try to extubate later today    LOS: 4 days   Additional comments:I reviewed the patient's new clinical lab test results. cxr and I reviewed the patients new imaging test results. cbc/bmet, and CXR  Critical Care Total Time*: 30 Minutes  Dashay Giesler O 08/11/2011  *Care during the described time interval was provided by me and/or other providers on the critical care  team.  I have reviewed this patient's available data, including medical history, events of note, physical examination and test results as part of my evaluation.

## 2011-08-11 NOTE — Procedures (Signed)
Extubation Procedure Note  Patient Details:   Name: EMORY GALLENTINE DOB: 1945-03-21 MRN: [id number]   Airway Documentation:     Evaluation  O2 sats: stable throughout Complications: Complications of Pulmonary Edema Patient did not tolerate procedure well. Bilateral Breath Sounds: Diminished Suctioning: Oral Yes  Pt tolerated wean very well. Pt was positive for cuff leak. ET tube suctioned prior to tube removal. Pt was extubated to 5lpm [place]. Pt tolerated extubation procedure, but was unable to keep O2 sats up. Pt was placed on 40% VM and sats stayed around 88-89. Pt placed on NIV 40% 13/6 and is now tolerating well. RT Will continue to monitor.   Parke Poisson 08/11/2011, 4:53 PM

## 2011-08-11 NOTE — Progress Notes (Signed)
Spoke with Dr. Andrey Campanile this AM regarding ABG results. Po2 56.1, sO2- 89.8. Patient is not in any distress.  Per order, will have RT increase PEEP to 8, and will recheck ABG at 0730. A. Terrilyn Tyner RN

## 2011-08-11 NOTE — Progress Notes (Signed)
ANTIBIOTIC CONSULT NOTE - FOLLOW UP  Pharmacy Consult for vancomycin Indication: PNA  Allergies  Allergen Reactions  . Meperidine Hcl     Patient Measurements: Height: 5\' 8"  (172.7 cm) Weight: 220 lb 7.4 oz (100 kg) (bed weight) IBW/kg (Calculated) : 68.4   Vital Signs: Temp: 99.3 F (37.4 C) (08/04 0800) Temp src: Oral (08/04 0800) BP: 129/55 mmHg (08/04 0900) Pulse Rate: 100  (08/04 0900) Intake/Output from previous day: 08/03 0701 - 08/04 0700 In: 4095 [I.V.:2400; NG/GT:715; IV Piggyback:980] Out: 2450 [Urine:2450] Intake/Output from this shift: Total I/O In: 509 [I.V.:175; NG/GT:80; IV Piggyback:254] Out: 230 [Urine:230]  Labs:  [facility] 08/11/11 0610 08/11/11 0436 08/10/11 0455 08/09/11 0420  WBC -- 15.4* 23.1* 20.2*  HGB -- 11.5* 13.7 15.2  PLT -- 163 181 232  LABCREA -- -- -- --  CREATININE 0.60 0.53 0.72 --   Estimated Creatinine Clearance: 105.5 ml/min (by C-G formula based on Cr of 0.6).   Assessment: 66 yo male with moraxella PNA on avelox/vancomycin. Renal function stable.  Vancomycin 8/1>> Avelox 8/1>>  7/31 MRSA PCR + 8/3 Bld x 2 pending 8/3 trach asp - moraxella   Goal of Therapy:  Vancomycin trough level 15-20 mcg/ml  Plan:  -No dose changes needed -Consider d/c vancomycin  Harland German, Pharm D 08/11/2011 10:10 AM

## 2011-08-11 NOTE — Progress Notes (Signed)
eLink Physician-Brief Progress Note Patient Name: Jeffrey Sweeney DOB: 12-11-1945 MRN: [id number]  Date of Service  08/11/2011   HPI/Events of Note   Pulmonary edema post extubation  I>>>O  NS IVF at 75/hr  eICU Interventions  Reduce IVF to [facility],  Give more lasix    Intervention Category Major Interventions: Respiratory failure - evaluation and management  Shan Levans 08/11/2011, 4:03 PM

## 2011-08-11 NOTE — Progress Notes (Signed)
0800AM   DR WYATT IN. RECEIVED VERBAL ORDER TO CONT. WRIST RESTRAINTS. NOTED LATER THAT ORDER WAS NOT ENTERED IN COMPUTER. UNABLE TO ENTER BACK ORDER FOR RESTRAINTS AS THEY WERE CONTINUED TO KEEP PT. FROM EXTUBATING SELF. RESTRAINTS WERE REMOVED WHEN PT. WAS EXTUBATED @1600  BUT NEEDED TO BE REAPPLIED @ 1800 AS PT. WAS TAKING BIPAP OFF & TRYING TO GET OOB. ORDER OBTAINED FROM DR. Delford Field IN [place]. SITTER NOW WITH PT.

## 2011-08-12 LAB — CBC WITH DIFFERENTIAL/PLATELET
Basophils Absolute: 0 10*3/uL (ref 0.0–0.1)
Basophils Relative: 0 % (ref 0–1)
Eosinophils Absolute: 0.3 10*3/uL (ref 0.0–0.7)
MCH: 32.6 pg (ref 26.0–34.0)
MCHC: 34.5 g/dL (ref 30.0–36.0)
Neutro Abs: 12.5 10*3/uL — ABNORMAL HIGH (ref 1.7–7.7)
Neutrophils Relative %: 79 % — ABNORMAL HIGH (ref 43–77)
Platelets: 206 10*3/uL (ref 150–400)
RDW: 14.7 % (ref 11.5–15.5)

## 2011-08-12 LAB — URINE CULTURE
Colony Count: NO GROWTH
Culture: NO GROWTH

## 2011-08-12 LAB — GLUCOSE, CAPILLARY
Glucose-Capillary: 98 mg/dL (ref 70–99)
Glucose-Capillary: 91 mg/dL (ref 70–99)
Glucose-Capillary: 91 mg/dL (ref 70–99)

## 2011-08-12 LAB — BASIC METABOLIC PANEL
BUN: 15 mg/dL (ref 6–23)
Chloride: 101 mEq/L (ref 96–112)
GFR calc Af Amer: >90 mL/min (ref >90)
GFR calc non Af Amer: >90 mL/min (ref >90)
Potassium: 3.3 mEq/L — ABNORMAL LOW (ref 3.5–5.1)

## 2011-08-12 MED ORDER — PANTOPRAZOLE SODIUM 40 MG PO TBEC
40.0000 mg | DELAYED_RELEASE_TABLET | Freq: Every day | ORAL | Status: DC
  Administered 2011-08-13 – 2011-08-20 (×8): 40 mg via ORAL
  Filled 2011-08-12 (×8): qty 1

## 2011-08-12 MED ORDER — BOOST / RESOURCE BREEZE PO LIQD
1.0000 | Freq: Three times a day (TID) | ORAL | Status: DC
  Administered 2011-08-12 – 2011-08-20 (×21): 1 via ORAL

## 2011-08-12 MED ORDER — VITAMIN C 500 MG PO TABS
1000.0000 mg | ORAL_TABLET | Freq: Three times a day (TID) | ORAL | Status: AC
  Administered 2011-08-12 – 2011-08-15 (×8): 1000 mg via ORAL
  Filled 2011-08-12 (×8): qty 2

## 2011-08-12 MED ORDER — VALPROIC ACID 250 MG/5ML PO SYRP
500.0000 mg | ORAL_SOLUTION | Freq: Every day | ORAL | Status: DC
  Administered 2011-08-13 – 2011-08-20 (×8): 500 mg via ORAL
  Filled 2011-08-12 (×9): qty 10

## 2011-08-12 MED ORDER — SELENIUM 50 MCG PO TABS
200.0000 ug | ORAL_TABLET | Freq: Every day | ORAL | Status: AC
  Administered 2011-08-13 – 2011-08-14 (×2): 200 ug via ORAL
  Filled 2011-08-12 (×2): qty 4

## 2011-08-12 MED ORDER — VITAMIN E 15 UNIT/0.3ML PO SOLN
400.0000 [IU] | Freq: Three times a day (TID) | ORAL | Status: DC
  Administered 2011-08-12: 400 [IU] via ORAL
  Filled 2011-08-12 (×5): qty 8

## 2011-08-12 NOTE — Progress Notes (Signed)
UR complete 

## 2011-08-12 NOTE — Progress Notes (Signed)
Nutrition Follow-up  Intervention:    Resource Breeze supplement 3 times daily (250 kcals, 9 gm protein per 8 fl oz carton) RD to follow for nutrition care plan  Assessment:   Patient extubated 8/4; was on BiPAP -- off at this time. Pivot 1.5 formula & sedation discontinued. Diet advanced to clears this AM. Patient would benefit from addition of nutrition supplement -- RD to order.  Diet Order:  Clear Liquids  Meds: Scheduled Meds:   . acetylcysteine  2 mL Nebulization Q4H  . ipratropium  0.5 mg Nebulization Q4H   And  . albuterol  2.5 mg Nebulization Q4H  . antiseptic oral rinse  15 mL Mouth Rinse QID  . chlorhexidine  15 mL Mouth Rinse BID  . Chlorhexidine Gluconate Cloth  6 each Topical Q0600  . enoxaparin (LOVENOX) injection  30 mg Subcutaneous Q12H  . furosemide      . furosemide  40 mg Intravenous Q6H  . levetiracetam  1,000 mg Intravenous Q8H  . moxifloxacin  400 mg Intravenous Q24H  . multivitamin  5 mL Per Tube Daily  . mupirocin ointment  1 application Nasal BID  . pantoprazole sodium  40 mg Per Tube Q1200  . selenium  200 mcg Per Tube Daily  . sodium chloride  10-40 mL Intracatheter Q12H  . Valproic Acid  500 mg Per Tube Daily  . vitamin C  1,000 mg Per Tube Q8H  . vitamin e  400 Units Per Tube Q8H  . DISCONTD: feeding supplement (PIVOT 1.5 CAL)  1,000 mL Per Tube Q24H  . DISCONTD: feeding supplement  30 mL Per Tube 5 X Daily  . DISCONTD: vancomycin  1,000 mg Intravenous Q12H   Continuous Infusions:   . 0.9 % NaCl with KCl 20 mEq / L 75 mL/hr at 08/11/11 0841  . DISCONTD: fentaNYL infusion INTRAVENOUS Stopped (08/10/11 0800)  . DISCONTD: propofol Stopped (08/08/11 1200)   PRN Meds:.fentaNYL, morphine injection, ondansetron (ZOFRAN) IV, ondansetron, sodium chloride, DISCONTD: midazolam  Labs:  CMP     Component Value Date/Time   NA 143 08/12/2011 0303   K 3.3* 08/12/2011 0303   CL 101 08/12/2011 0303   CO2 34* 08/12/2011 0303   GLUCOSE 99 08/12/2011 0303   BUN 15  08/12/2011 0303   CREATININE 0.64 08/12/2011 0303   CALCIUM 8.9 08/12/2011 0303   PROT 6.4 08/09/2011 0420   ALBUMIN 2.8* 08/09/2011 0420   AST 15 08/09/2011 0420   ALT <5 08/09/2011 0420   ALKPHOS 85 08/09/2011 0420   BILITOT 0.7 08/09/2011 0420   GFRNONAA >90 08/12/2011 0303   GFRAA >90 08/12/2011 0303     Intake/Output Summary (Last 24 hours) at 08/12/11 1428 Last data filed at 08/12/11 1300  Gross per 24 hour  Intake   1785 ml  Output   4865 ml  Net  -3080 ml    CBG (last 3)   Basename 08/12/11 1144 08/12/11 0743 08/12/11 0333  GLUCAP 103* 98 81    Weight Status: 94.9 kg (8/5) -- fluctuating  Re-estimated needs:  2200-2400 kcals, 110-120 gm protein  New Nutrition Dx:  Inadequate Protein-Energy Intake r/t clear liquids as evidenced by diet meeting ~ 50% of patient estimated kcal, protein needs  New Goal:  Oral intake with meals & supplements to meet >/= 90% of estimated nutrition needs, currently unmet  Monitor:  PO & supplemental intake, weight, labs, I/O's  Kirkland Hun, RD, LDN Pager #: [phone] After-Hours Pager #: [phone]

## 2011-08-12 NOTE — Progress Notes (Signed)
  Subjective: Confusion overnight, trying to take off BIPAP but now alert, following commands Remains on BIPAP Denies SOB  Objective: Vital signs in last 24 hours: Temp:  [97.9 F (36.6 C)-99.4 F (37.4 C)] 98.5 F (36.9 C) (08/05 0340) Pulse Rate:  [71-100] 83  (08/05 0700) Resp:  [14-26] 19  (08/05 0700) BP: (113-149)/(53-74) 127/68 mmHg (08/05 0700) SpO2:  [90 %-99 %] 94 % (08/05 0700) FiO2 (%):  [39.4 %-99.5 %] 39.8 % (08/05 0700) Weight:  [209 lb 3.5 oz (94.9 kg)] 209 lb 3.5 oz (94.9 kg) (08/05 0600)    Intake/Output from previous day: 08/04 0701 - 08/05 0700 In: 1669 [I.V.:945; NG/GT:140; IV Piggyback:584] Out: 8120 [Urine:8120] Intake/Output this shift:   Gen:  Comfortable Lungs:  Decrease left base otherwise clear, rate normal CV:  RRR Abdomen:  Soft, NT/ND  Lab Results:   Basename 08/12/11 0303 08/11/11 0436  WBC 15.7* 15.4*  HGB 13.9 11.5*  HCT 40.3 35.6*  PLT 206 163   BMET  Basename 08/12/11 0303 08/11/11 0610  NA 143 140  K 3.3* 3.9  CL 101 107  CO2 34* 28  GLUCOSE 99 111*  BUN 15 19  CREATININE 0.64 0.60  CALCIUM 8.9 8.7   PT/INR No results found for this basename: LABPROT:2,INR:2 in the last 72 hours ABG  Basename 08/11/11 0730 08/11/11 0436  PHART 7.380 7.385  HCO3 26.9* 26.7*    Studies/Results: Dg Chest Port 1 View  08/11/2011  *RADIOLOGY REPORT*  Clinical Data: Respiratory failure  PORTABLE CHEST - 1 VIEW  Comparison:   the previous day's study  Findings: Endotracheal tube, nasogastric tube, and right arm PICC are stable in position.  Heart size upper limits normal for technique.  Relatively low lung volumes.  Mild interstitial airspace opacities throughout both lungs not convincingly changed from previous exam given differences in degree of inspiration.  No effusion evident.  IMPRESSION:  1.  Slightly improved aeration.  Otherwise no convincing change in bilateral infiltrates/edema. 2. Support hardware stable in position.  Original  Report Authenticated By: Osa Craver, M.D.    Anti-infectives: Anti-infectives     Start     Dose/Rate Route Frequency Ordered Stop   08/08/11 2300   vancomycin (VANCOCIN) IVPB 1000 mg/200 mL premix  Status:  Discontinued        1,000 mg 200 mL/hr over 60 Minutes Intravenous Every 12 hours 08/08/11 0954 08/11/11 1539   08/08/11 1100   vancomycin (VANCOCIN) 2,500 mg in sodium chloride 0.9 % 500 mL IVPB        2,500 mg 250 mL/hr over 120 Minutes Intravenous  Once 08/08/11 0954 08/08/11 1443   08/08/11 1000   moxifloxacin (AVELOX) IVPB 400 mg        400 mg 250 mL/hr over 60 Minutes Intravenous Every 24 hours 08/08/11 0847 08/18/11 0959          Assessment/Plan: s/p * No surgery found *  Continue pulmonary toilet.  Plans per CCM Increase activity Pain control Continue foley cath  LOS: 5 days    Kenzleigh Sedam A 08/12/2011

## 2011-08-13 ENCOUNTER — Inpatient Hospital Stay (HOSPITAL_COMMUNITY): Payer: Medicare Other

## 2011-08-13 DIAGNOSIS — I4891 Unspecified atrial fibrillation: Secondary | ICD-10-CM | POA: Diagnosis present

## 2011-08-13 LAB — GLUCOSE, CAPILLARY
Glucose-Capillary: 80 mg/dL (ref 70–99)
Glucose-Capillary: 83 mg/dL (ref 70–99)

## 2011-08-13 LAB — BASIC METABOLIC PANEL
CO2: 31 mEq/L (ref 19–32)
Calcium: 8.7 mg/dL (ref 8.4–10.5)
GFR calc Af Amer: >90 mL/min (ref >90)
Sodium: 136 mEq/L (ref 135–145)

## 2011-08-13 MED ORDER — MORPHINE SULFATE 2 MG/ML IJ SOLN: 2.0000 mg | INTRAMUSCULAR | Status: DC | PRN

## 2011-08-13 MED ORDER — FUROSEMIDE 10 MG/ML IJ SOLN
INTRAMUSCULAR | Status: AC
  Filled 2011-08-13: qty 4

## 2011-08-13 MED ORDER — POTASSIUM CHLORIDE CRYS ER 20 MEQ PO TBCR
EXTENDED_RELEASE_TABLET | ORAL | Status: AC
  Administered 2011-08-13: 40 meq
  Filled 2011-08-13: qty 2

## 2011-08-13 MED ORDER — POTASSIUM CHLORIDE CRYS ER 20 MEQ PO TBCR
20.0000 meq | EXTENDED_RELEASE_TABLET | Freq: Two times a day (BID) | ORAL | Status: DC
  Administered 2011-08-13 – 2011-08-14 (×4): 20 meq via ORAL
  Filled 2011-08-13 (×6): qty 1

## 2011-08-13 MED ORDER — ACETYLCYSTEINE 20 % IN SOLN
2.0000 mL | RESPIRATORY_TRACT | Status: DC | PRN
  Filled 2011-08-13: qty 4

## 2011-08-13 MED ORDER — METOPROLOL TARTRATE 1 MG/ML IV SOLN
5.0000 mg | Freq: Once | INTRAVENOUS | Status: AC
  Administered 2011-08-13: 5 mg via INTRAVENOUS

## 2011-08-13 MED ORDER — LEVETIRACETAM 500 MG PO TABS
1000.0000 mg | ORAL_TABLET | Freq: Three times a day (TID) | ORAL | Status: DC
  Administered 2011-08-13 – 2011-08-20 (×21): 1000 mg via ORAL
  Filled 2011-08-13 (×24): qty 2

## 2011-08-13 MED ORDER — POTASSIUM CHLORIDE 20 MEQ PO PACK
40.0000 meq | PACK | Freq: Once | ORAL | Status: DC
  Filled 2011-08-13: qty 2

## 2011-08-13 MED ORDER — HYDROCODONE-ACETAMINOPHEN 5-325 MG PO TABS: 0.5000 | ORAL_TABLET | ORAL | Status: DC | PRN

## 2011-08-13 MED ORDER — VITAMIN E 180 MG (400 UNIT) PO CAPS
400.0000 [IU] | ORAL_CAPSULE | Freq: Three times a day (TID) | ORAL | Status: AC
  Administered 2011-08-13 – 2011-08-15 (×7): 400 [IU] via ORAL
  Filled 2011-08-13 (×8): qty 1

## 2011-08-13 MED ORDER — METOPROLOL TARTRATE 1 MG/ML IV SOLN
INTRAVENOUS | Status: AC
  Administered 2011-08-13: 5 mg via INTRAVENOUS
  Filled 2011-08-13: qty 5

## 2011-08-13 MED ORDER — FUROSEMIDE 10 MG/ML IJ SOLN
40.0000 mg | Freq: Once | INTRAMUSCULAR | Status: AC
  Administered 2011-08-13: 40 mg via INTRAVENOUS

## 2011-08-13 MED ORDER — MOXIFLOXACIN HCL 400 MG PO TABS
400.0000 mg | ORAL_TABLET | Freq: Every day | ORAL | Status: DC
  Administered 2011-08-13: 400 mg via ORAL
  Filled 2011-08-13 (×2): qty 1

## 2011-08-13 NOTE — Progress Notes (Signed)
I saw the patient, participated in the history, exam and medical decision making, and concur with the physician assistant's note above.   Needs IS   Mary Sella. Andrey Campanile, MD, FACS General, Bariatric, & Minimally Invasive Surgery [facility], [place]

## 2011-08-13 NOTE — Progress Notes (Signed)
MD paged concerning patients declining respiratory status, sats dropping after breathing treatments.  Also mentioned am labs yesterday and KCL level yesterday morning. Orders received, medications administered.  Will continue to monitor very closely.

## 2011-08-13 NOTE — Progress Notes (Signed)
Per day shift RN, foley catheter taken out at 1600.  Bladder scan at 2200 showed 130 cc urine.  MD on call notified, PRN in and out cath order placed for >250cc urine.   2230: Pt voided 175 cc amber-colored urine.  Will continue to monitor.   Maximino Greenland RN

## 2011-08-13 NOTE — Progress Notes (Signed)
Pt in rapid Atrial Fib at rates between 110-150. Sitting in bed using Incentive spirometer.  Stat 12 ld EKG to be done to confirm.

## 2011-08-13 NOTE — Progress Notes (Signed)
Jeffrey Sweeney not OOB to chair this AM due to his oxygen saturations dropping with turning during his bath and issues with his low sats throughout the night tonight. Will advise oncoming shift and MD during rounds. Pt sats currently 89-93 with sob on mild exertion.

## 2011-08-13 NOTE — Progress Notes (Signed)
1335 5mg  lopressor given IV. HR still 140-155 AFib. Pt without complaints. BP down to 94/56.  Dr Andrey Campanile notified and pt now in SR rate 71. Continue to watch, no complaints.

## 2011-08-13 NOTE — Clinical Social Work Placement (Signed)
     Clinical Social Work Department CLINICAL SOCIAL WORK PLACEMENT NOTE 08/13/2011  Patient:  Jeffrey Sweeney, Jeffrey Sweeney  Account Number:  [id number] Admit date:  08/07/2011  Clinical Social Worker:  Macario Golds, Theresia Majors  Date/time:  08/13/2011 04:45 PM  Clinical Social Work is seeking post-discharge placement for this patient at the following level of care:   SKILLED NURSING   (*[name] will update this form in Epic as items are completed)   08/13/2011  Patient/family provided with [facility] Department of Clinical Social Works list of facilities offering this level of care within the geographic area requested by the patient (or if unable, by the patients family).  08/13/2011  Patient/family informed of their freedom to choose among providers that offer the needed level of care, that participate in Medicare, Medicaid or managed care program needed by the patient, have an available bed and are willing to accept the patient.  08/13/2011  Patient/family informed of [facility] ownership interest in [facility], as well as of the fact that they are under no obligation to receive care at this facility.  PASARR submitted to EDS on 08/14/2011 PASARR number received from EDS on   FL2 transmitted to all facilities in geographic area requested by pt/family on  08/13/2011 FL2 transmitted to all facilities within larger geographic area on   Patient informed that his/her managed care company has contracts with or will negotiate with  certain facilities, including the following:     Patient/family informed of bed offers received:   Patient chooses bed at  Physician recommends and patient chooses bed at    Patient to be transferred to  on   Patient to be transferred to facility by   The following physician request were entered in Epic:   Additional Comments: 08/06 - Patient requesting [facility].  Awaiting signature for 30 day note to submit for Pasarr

## 2011-08-13 NOTE — Progress Notes (Signed)
EKG confirmed Rapid A. Fib. Dr Andrey Campanile notified and orders for Lopressor given.  Pt tolerating irreg. Heart rate without difficulty.  States "I feel ok".

## 2011-08-13 NOTE — Clinical Social Work Psychosocial (Signed)
     Clinical Social Work Department BRIEF PSYCHOSOCIAL ASSESSMENT 08/13/2011  Patient:  Jeffrey Sweeney, Jeffrey Sweeney     Account Number:  [id number]     Admit date:  08/07/2011  Clinical Social Worker:  Pearson Forster  Date/Time:  08/13/2011 04:40 PM  Referred by:  Physician  Date Referred:  08/13/2011 Referred for  SNF Placement   Other Referral:   Interview type:  Patient Other interview type:   Patient requested no family be contacted at this time    PSYCHOSOCIAL DATA Living Status:  ALONE Admitted from facility:   Level of care:   Primary support name:  Matteo,Crystal  [phone] Primary support relationship to patient:  CHILD, ADULT Degree of support available:   Adequate    CURRENT CONCERNS Current Concerns  Post-Acute Placement   Other Concerns:    SOCIAL WORK ASSESSMENT / PLAN Clinical Social Worker met with patient at bedside to offer emotional support and discuss patient plans at discharge. Patient states that he currently lives alone and would like to return home upon discharge.  Patient says that he fell off the toilet and broke several ribs which led to this hospitalization.  Patient is agreeable to initiate SNF search in [facility], however prefers to return home at discharge if able.  [name] will intiate SNF search and follow up with patient regarding possible bed offers.  [name] awaiting PT/OT evaluations to begin insurance approval on patient behalf.    Clinical Social Worker spoke with patient regarding current substance use.  Patient states that he used to drink a lot of alcohol but has been sober now for many years.  Patient understands of the negative effects of alcohol with his many medical conditions.  SBIRT complete.  No resources needed at this time.    Clinical Social Worker will continue to remain available for emotional support and to facilitate patient discharge needs once medically ready.   Assessment/plan status:  Psychosocial Support/Ongoing  Assessment of Needs Other assessment/ plan:   Information/referral to community resources:   Patient is familiar with the SNF search process and facility placement.  Patient prefers to stay in the [place] area.  Patient with no current alcohol concerns at this time.  [name] will provide necessary resources throughout patient hospitalization.    PATIENTS/FAMILYS RESPONSE TO PLAN OF CARE: Patient alert and oriented x3.  Patient has asked not to contact family at work - they will be available and present at the hospital after work.  Patient would prefer to work with PT/OT before making a final decision regarding his discharge plan.  Patient verbalized his appreciation for [name] support and concern.

## 2011-08-13 NOTE — Progress Notes (Signed)
Jeffrey Sweeney respiratory status has improved, sats are now 94%.  He has diuresed over the past hour and is resting in bed comfortably.

## 2011-08-13 NOTE — Progress Notes (Signed)
Pt discussed in hospital LOS meeting today. 

## 2011-08-13 NOTE — Progress Notes (Signed)
Patient ID: Jeffrey Sweeney, male   DOB: February 10, 1945, 66 y.o.   MRN: [id number]   LOS: 6 days   Subjective: No new c/o. Pain is controlled.  Objective: Vital signs in last 24 hours: Temp:  [98 F (36.7 C)-98.9 F (37.2 C)] 98 F (36.7 C) (08/06 0806) Pulse Rate:  [70-85] 71  (08/06 0700) Resp:  [18-25] 22  (08/06 0700) BP: (110-141)/(50-82) 133/75 mmHg (08/06 0700) SpO2:  [85 %-100 %] 94 % (08/06 0815) FiO2 (%):  [39.7 %-40 %] 39.7 % (08/05 0900) Weight:  [95.5 kg (210 lb 8.6 oz)] 95.5 kg (210 lb 8.6 oz) (08/06 0500)    Lab Results:  BMET  Basename 08/13/11 0510 08/12/11 0303  NA 136 143  K 3.2* 3.3*  CL 98 101  CO2 31 34*  GLUCOSE 88 99  BUN 13 15  CREATININE 0.57 0.64  CALCIUM 8.7 8.9    PORTABLE CHEST - 1 VIEW  Comparison: Chest radiograph 08/11/2011  Findings: Interval extubation. Right PICC line remains unchanged.  Stable enlarged heart silhouette. There are is ani interstitial  edema pattern not changed from prior. Basilar atelectasis may be  slightly increased.  IMPRESSION:  1. Slight increase in basilar atelectasis following extubation.  2. Stable interstitial edema.  Original Report Authenticated By: Genevive Bi, M.D.   General appearance: alert and no distress Resp: clear to auscultation bilaterally Cardio: regular rate and rhythm GI: normal findings: bowel sounds normal and soft, non-tender   Assessment/Plan: Fall Multiple left rib fxs -- Pain control and pulmonary toilet Multiple medical problems -- Home meds FEN -- Advance diet, d/c foley. K for mild hypokalemia VTE -- Lovenox, SCD's Dispo -- PT/OT. Transfer to SDU.   Freeman Caldron, PA-C Pager: [phone] General Trauma PA Pager: [phone]   08/13/2011

## 2011-08-14 DIAGNOSIS — R0902 Hypoxemia: Secondary | ICD-10-CM

## 2011-08-14 DIAGNOSIS — R0602 Shortness of breath: Secondary | ICD-10-CM

## 2011-08-14 DIAGNOSIS — E876 Hypokalemia: Secondary | ICD-10-CM | POA: Diagnosis not present

## 2011-08-14 DIAGNOSIS — R55 Syncope and collapse: Secondary | ICD-10-CM

## 2011-08-14 DIAGNOSIS — I4891 Unspecified atrial fibrillation: Secondary | ICD-10-CM

## 2011-08-14 DIAGNOSIS — I82409 Acute embolism and thrombosis of unspecified deep veins of unspecified lower extremity: Secondary | ICD-10-CM | POA: Clinically undetermined

## 2011-08-14 LAB — BASIC METABOLIC PANEL
BUN: 18 mg/dL (ref 6–23)
CO2: 32 mEq/L (ref 19–32)
Chloride: 98 mEq/L (ref 96–112)
Creatinine, Ser: 0.69 mg/dL (ref 0.50–1.35)
Glucose, Bld: 90 mg/dL (ref 70–99)
Potassium: 2.8 mEq/L — ABNORMAL LOW (ref 3.5–5.1)

## 2011-08-14 LAB — CBC
HCT: 41.3 % (ref 39.0–52.0)
Hemoglobin: 14.1 g/dL (ref 13.0–17.0)
MCH: 31.8 pg (ref 26.0–34.0)
MCHC: 34.1 g/dL (ref 30.0–36.0)
MCV: 93 fL (ref 78.0–100.0)
Platelets: 223 10*3/uL (ref 150–400)
RBC: 4.44 MIL/uL (ref 4.22–5.81)
RDW: 14.3 % (ref 11.5–15.5)
WBC: 11.6 10*3/uL — ABNORMAL HIGH (ref 4.0–10.5)

## 2011-08-14 LAB — TSH: TSH: 2.199 u[IU]/mL (ref 0.350–4.500)

## 2011-08-14 MED ORDER — ENOXAPARIN SODIUM 80 MG/0.8ML ~~LOC~~ SOLN
75.0000 mg | Freq: Once | SUBCUTANEOUS | Status: AC
  Administered 2011-08-14: 75 mg via SUBCUTANEOUS
  Filled 2011-08-14: qty 0.8

## 2011-08-14 MED ORDER — WARFARIN SODIUM 7.5 MG PO TABS
7.5000 mg | ORAL_TABLET | Freq: Once | ORAL | Status: AC
  Administered 2011-08-14: 7.5 mg via ORAL
  Filled 2011-08-14: qty 1

## 2011-08-14 MED ORDER — WARFARIN VIDEO
Freq: Once | Status: AC
  Administered 2011-08-14: 17:00:00

## 2011-08-14 MED ORDER — ENOXAPARIN SODIUM 120 MG/0.8ML ~~LOC~~ SOLN
105.0000 mg | Freq: Two times a day (BID) | SUBCUTANEOUS | Status: DC
  Administered 2011-08-15 – 2011-08-18 (×7): 105 mg via SUBCUTANEOUS
  Filled 2011-08-14 (×9): qty 0.8

## 2011-08-14 MED ORDER — COUMADIN BOOK
Freq: Once | Status: AC
  Administered 2011-08-14: 17:00:00
  Filled 2011-08-14: qty 1

## 2011-08-14 MED ORDER — FUROSEMIDE 10 MG/ML IJ SOLN
40.0000 mg | Freq: Once | INTRAMUSCULAR | Status: AC
  Administered 2011-08-14: 40 mg via INTRAVENOUS
  Filled 2011-08-14: qty 4

## 2011-08-14 MED ORDER — POTASSIUM CHLORIDE 10 MEQ/100ML IV SOLN
10.0000 meq | INTRAVENOUS | Status: AC
  Administered 2011-08-14 (×5): 10 meq via INTRAVENOUS
  Filled 2011-08-14 (×3): qty 100
  Filled 2011-08-14: qty 200

## 2011-08-14 MED ORDER — WARFARIN - PHARMACIST DOSING INPATIENT: Freq: Every day | Status: DC

## 2011-08-14 MED ORDER — SODIUM CHLORIDE 0.9 % IJ SOLN
INTRAMUSCULAR | Status: AC
  Filled 2011-08-14: qty 10

## 2011-08-14 NOTE — Progress Notes (Signed)
Addendum: Pt with cognitive deficits and PTA home environment obtained from patient. Question reliability of the information.  I agree with the following treatment note after reviewing documentation.   Harrel Carina [place]   OTR/L Pager: [phone] Office: [phone] .

## 2011-08-14 NOTE — Progress Notes (Signed)
ANTICOAGULATION CONSULT NOTE - Initial Consult  Pharmacy Consult for coumadin Indication: DVT  Allergies  Allergen Reactions  . Meperidine Hcl     Patient Measurements: Height: 5\' 8"  (172.7 cm) Weight: 233 lb 11 oz (106 kg) IBW/kg (Calculated) : 68.4    Vital Signs: Temp: 98.2 F (36.8 C) (08/07 1206) Temp src: Oral (08/07 1206) BP: 115/67 mmHg (08/07 1202) Pulse Rate: 85  (08/07 1202)  Labs:  Basename 08/14/11 0400 08/13/11 0510 08/12/11 0303  HGB 14.1 -- 13.9  HCT 41.3 -- 40.3  PLT 223 -- 206  APTT -- -- --  LABPROT -- -- --  INR -- -- --  HEPARINUNFRC -- -- --  CREATININE 0.69 0.57 0.64  CKTOTAL -- -- --  CKMB -- -- --  TROPONINI -- -- --    Estimated Creatinine Clearance: 108.6 ml/min (by C-G formula based on Cr of 0.69).   Medical History: Past Medical History  Diagnosis Date  . Heart disease   . CAD (coronary artery disease)   . Stroke     left arm weakness  . Seizures   . Depression     Medications:  Prescriptions prior to admission  Medication Sig Dispense Refill  . albuterol (PROAIR HFA) 108 (90 BASE) MCG/ACT inhaler Inhale 2 puffs into the lungs 4 (four) times daily.      Marland Kitchen ALPRAZolam (XANAX) 1 MG tablet Take 1 mg by mouth 3 (three) times daily as needed. For anxiety      . amLODipine (NORVASC) 5 MG tablet Take 5 mg by mouth daily.      Marland Kitchen atorvastatin (LIPITOR) 40 MG tablet Take 40 mg by mouth at bedtime.       . Eszopiclone (ESZOPICLONE) 3 MG TABS Take 3 mg by mouth at bedtime. Take immediately before bedtime      . HYDROcodone-acetaminophen (NORCO) 7.5-325 MG per tablet Take 1 tablet by mouth 4 (four) times daily.      Marland Kitchen levETIRAcetam (KEPPRA) 1000 MG tablet Take 1,000 mg by mouth 3 (three) times daily.      . nitroGLYCERIN (NITROSTAT) 0.4 MG SL tablet Place 0.4 mg under the tongue every 5 (five) minutes as needed. Chest pain      . pantoprazole (PROTONIX) 40 MG tablet Take 40 mg by mouth daily.        . phenelzine (NARDIL) 15 MG tablet  Take 2 tablets (30 mg total) by mouth 3 (three) times daily.  180 tablet  1  . potassium chloride 20 MEQ/15ML (10%) SOLN Take 20 mEq by mouth daily.      . Valproic Acid (DEPAKENE) 250 MG/5ML SYRP syrup Take 500 mg by mouth daily.         Assessment: 66 yo man transferred from [facility] with multiple rib fractures now found to have DVT.  MD is dosing lovenox and coumadin to start per pharmacy.   Goal of Therapy:  INR 2-3 Monitor platelets by anticoagulation protocol: Yes   Plan:  Coumadin day 1/5 overlap for DVT treatment. Will initiate with 7.5 mg today. Check daily PT/INR. F/u education. Monitor for S&S bleeding.  Talbert Cage [place] 08/14/2011,3:21 PM

## 2011-08-14 NOTE — Consult Note (Signed)
Hospital Admission Note Date: 08/14/2011  Patient name: Jeffrey Sweeney Medical record number: [id number] Date of birth: 06/11/1945 Age: 66 y.o. Gender: male PCP: Fredirick Maudlin, MD  Attending physician: Trauma Md, MD  Reason for consult: Syncope  History of Present Illness: Patient is a 65 year old gentleman with a history of seizure disorder and OCD in the past. The patient states that on the day prior to presenting to the emergency room he had a blackout episodes and fell over the toilet sustaining rib fractures and leading to a hemothorax and collapse. The patient has been managed for his acute hemothorax and is now stable condition. [place] the patient for evaluation of the syncopal episode that led to the fall the first place.  A review of the patient's condition appears that the patient had a sudden blackout without any warning of the syncopal episode. He denies any chest pain, palpitations, visual disturbances, dizziness, disequilibrium, seizure activity or any premonitory signs. Since he's been hospitalized he has had some paroxysmal atrial fibrillation on the monitor however is now in normal sinus rhythm. When asked about any palpitations the patient states that the only time he feels his heart rate increasing is when he is walking fast. The patient however is not aware of any arrhythmias.  Scheduled Meds:   . ipratropium  0.5 mg Nebulization Q4H   And  . albuterol  2.5 mg Nebulization Q4H  . coumadin book   Does not apply Once  . enoxaparin (LOVENOX) injection  105 mg Subcutaneous Q12H  . enoxaparin (LOVENOX) injection  75 mg Subcutaneous Once  . feeding supplement  1 Container Oral TID BM  . furosemide  40 mg Intravenous Once  . levETIRAcetam  1,000 mg Oral TID  . pantoprazole  40 mg Oral Q1200  . potassium chloride  10 mEq Intravenous Q1 Hr x 5  . potassium chloride  20 mEq Oral BID  . selenium  200 mcg Oral Daily  . sodium chloride  10-40 mL Intracatheter Q12H  . Valproic Acid   500 mg Oral Daily  . vitamin C  1,000 mg Oral Q8H  . vitamin E  400 Units Oral TID  . warfarin  7.5 mg Oral ONCE-1800  . warfarin   Does not apply Once  . Warfarin - Pharmacist Dosing Inpatient   Does not apply q1800  . DISCONTD: enoxaparin (LOVENOX) injection  30 mg Subcutaneous Q12H  . DISCONTD: moxifloxacin  400 mg Oral Q2000   Continuous Infusions:  PRN Meds:.acetylcysteine, HYDROcodone-acetaminophen, morphine injection, ondansetron (ZOFRAN) IV, ondansetron, sodium chloride Allergies: Meperidine hcl Past Medical History  Diagnosis Date  . Heart disease   . CAD (coronary artery disease)   . Stroke     left arm weakness  . Seizures   . Depression    Past Surgical History  Procedure Date  . Ankle fracture surgery    Family History  Problem Relation Age of Onset  . Cancer Mother   . Cancer Father    History   Social History  . Marital Status: Widowed    Spouse Name: N/A    Number of Children: N/A  . Years of Education: N/A   Occupational History  . Not on file.   Social History Main Topics  . Smoking status: Current Everyday Smoker -- 0.5 packs/day for 50 years    Types: Cigarettes  . Smokeless tobacco: Never Used  . Alcohol Use: No  . Drug Use: No  . Sexually Active: Not on file   Other Topics  Concern  . Not on file   Social History Narrative  . No narrative on file   Review of Systems: A comprehensive review of systems was negative. Physical Exam:  Intake/Output Summary (Last 24 hours) at 08/14/11 1959 Last data filed at 08/14/11 1842  Gross per 24 hour  Intake   1080 ml  Output    975 ml  Net    105 ml   General: Alert, awake, oriented x3, in no acute distress.  HEENT: [place]/AT PEERL, EOMI Neck: Trachea midline,  no masses, no thyromegal,y no JVD, no carotid bruit OROPHARYNX:  Moist, No exudate/ erythema/lesions.  Heart: Regular rate and rhythm, without murmurs, rubs, gallops.  Lungs: Clear to auscultation, no wheezing or rhonchi  noted. Abdomen: Soft, nontender, nondistended, positive bowel sounds, no masses no hepatosplenomegaly noted.  Neuro: No focal neurological deficits noted  Musculoskeletal: No warm swelling or erythema around joints, no spinal tenderness noted. Psychiatric: Patient alert and oriented x3 recent to remote recall.   Lab results:  Basename 08/14/11 0400 08/13/11 0510  NA 139 136  K 2.8* 3.2*  CL 98 98  CO2 32 31  GLUCOSE 90 88  BUN 18 13  CREATININE 0.69 0.57  CALCIUM 8.8 8.7  MG -- --  PHOS -- --   No results found for this basename: AST:2,ALT:2,ALKPHOS:2,BILITOT:2,PROT:2,ALBUMIN:2 in the last 72 hours No results found for this basename: LIPASE:2,AMYLASE:2 in the last 72 hours  Basename 08/14/11 0400 08/12/11 0303  WBC 11.6* 15.7*  NEUTROABS -- 12.5*  HGB 14.1 13.9  HCT 41.3 40.3  MCV 93.0 94.6  PLT 223 206   No results found for this basename: CKTOTAL:3,CKMB:3,CKMBINDEX:3,TROPONINI:3 in the last 72 hours No components found with this basename: POCBNP:3 No results found for this basename: DDIMER:2 in the last 72 hours No results found for this basename: HGBA1C:2 in the last 72 hours No results found for this basename: CHOL:2,HDL:2,LDLCALC:2,TRIG:2,CHOLHDL:2,LDLDIRECT:2 in the last 72 hours  Basename 08/13/11 1815  TSH 2.199  T4TOTAL --  T3FREE --  THYROIDAB --   No results found for this basename: VITAMINB12:2,FOLATE:2,FERRITIN:2,TIBC:2,IRON:2,RETICCTPCT:2 in the last 72 hours Imaging results:  Dg Ribs Unilateral W/chest Left  08/07/2011  *RADIOLOGY REPORT*  Clinical Data: Fall today.  Left rib pain.  Flank pain.  LEFT RIBS AND CHEST - 3+ VIEW  Comparison: Portable chest 02/09/2011.  Findings: Heart is mildly enlarged.  A prominent left pleural effusion is present.  Multiple left-sided rib fractures involve the 7th through 11th ribs laterally and anteriorly.  There is no definite pneumothorax.  Limited imaging of the upper abdomen demonstrates contrast within the left renal  collecting system secondary to the patient's CT scan.  IMPRESSION:  1.  Left-sided 7th through 11th rib fractures. 2.  Left pleural effusion and airspace disease likely reflects a hemothorax and atelectasis or pulmonary contusion.  Original Report Authenticated By: Jamesetta Orleans. MATTERN, M.D.   Ct Head Wo Contrast  08/07/2011  *RADIOLOGY REPORT*  Clinical Data: Fall with history of stroke affecting left arm.  CT HEAD WITHOUT CONTRAST  Technique:  Contiguous axial images were obtained from the base of the skull through the vertex without contrast.  Comparison: 02/09/2011  Findings: Bone windows demonstrate no significant soft tissue swelling.  Mucous retention cyst or polyp in the right maxillary sinus.  There are also more cephalad secretions in this area. Persistent ethmoid air cell mucosal thickening.  Clear mastoid air cells.  Soft tissue windows demonstrate redemonstration of encephalomalacia involving the right parietal and occipital lobes.  Resultant ex vacuo dilatation of the right lateral ventricle.  Mild low density in the periventricular white matter likely related to small vessel disease.Mild extension of encephalomalacia in the right temporal lobe which is unchanged.  No  mass lesion, hemorrhage, hydrocephalus, acute infarct, intra- axial, or extra-axial fluid collection.  IMPRESSION:  1.  No acute or post-traumatic deformity identified. 2.  Remote right-sided hemispheric infarct. 3.  Mild small vessel ischemic change. 4.  Sinus disease.  Original Report Authenticated By: Consuello Bossier, M.D.   Ct Chest W Contrast  08/07/2011  *RADIOLOGY REPORT*  Clinical Data:  The patient fell at home.  Neck pain.  Left-sided rib fractures.  Pneumothorax.  New left chest tube.  CT CHEST WITH CONTRAST  Technique:  Multidetector CT imaging of the chest was performed following the standard protocol during bolus administration of intravenous contrast.  Contrast: OMNIPAQUE IOHEXOL 300 MG/ML  SOLN  Comparison:  .Multiple prior studies from 08/07/2011  Findings: The patient has a left-sided chest tube, tip in the region of the left lung apex.  Small left pneumothorax persists. There is consolidation of collapsed lung on the left.  No evidence for pleural effusion.  Numerous left-sided rib fractures are present, involving at least ribs 07/09/2008.  The 11th rib is not completely imaged.  The right lung is well-aerated.  The heart size is normal.  No evidence for mediastinal injury. The visualized portion of the thyroid gland has a normal appearance.  Images of the upper abdomen are unremarkable.  IMPRESSION:  1.  Multiple left-sided rib fractures. 2.  Small left residual pneumothorax. 3.  Consolidation and atelectasis of the left lung.  Original Report Authenticated By: Patterson Hammersmith, M.D.   Ct Cervical Spine Wo Contrast  08/07/2011  *RADIOLOGY REPORT*  Clinical Data:  Trauma.  The patient fell at home.  CT CERVICAL SPINE WITHOUT CONTRAST  Technique:  Multidetector CT imaging of the cervical spine was performed without intravenous contrast.  Multiplanar CT image reconstructions were also generated.  Comparison:  Multiple prior CT exams of the head, most recent 08/07/2011  Findings:  There is moderate degenerative change throughout the cervical spine.  No evidence for acute fracture or subluxation. Left-sided chest tube is imaged at the left lung apex.  Left pneumothorax is present.  IMPRESSION:  1.  Degenerative changes. 2. No evidence for acute osseous abnormality. 3.  Left pneumothorax and chest tube.  Original Report Authenticated By: Patterson Hammersmith, M.D.   Ct Abdomen Pelvis W Contrast  08/07/2011  *RADIOLOGY REPORT*  Clinical Data: status post fall.  CT ABDOMEN AND PELVIS WITH CONTRAST  Technique:  Multidetector CT imaging of the abdomen and pelvis was performed following the standard protocol during bolus administration of intravenous contrast.  Contrast: OMNIPAQUE IOHEXOL 300 MG/ML  SOLN   Comparison: 02/18/2010  Findings: Atelectasis and consolidation involving the left lung with associated left lung volume loss is identified.  This is a new finding since 02/08/2010.  No pericardial or pleural effusion identified.  No focal liver abnormality.  The gallbladder is normal.  No biliary dilatation.  The pancreas is unremarkable.  Similar appearance of the spleen with deformity of the anterior portion of the splenic parenchyma.  Query prior infarct or trauma.  Both adrenal glands are normal.  Bilateral renal cysts are noted. There is a nonobstructing calculi within the upper pole the right kidney measuring 2-3 mm, image 41.  Unchanged from previous exam. The urinary bladder appears normal.  There is no adenopathy  within the upper abdomen.  No pelvic or inguinal adenopathy identified.  There is no free fluid or fluid collections within the abdomen or pelvis.  Review of the visualized bony structures is significant for acute left 8th through 12th rib fracture deformities.  No right rib fracture deformities.  There is a curvature of the thoracic and lumbar spine which is convex to the right.  Mild multilevel degenerative disc disease is noted.  The vertebral body heights are well maintained.  IMPRESSION:  1.  Multiple acute left sided rib fractures. 2.  Atelectasis and consolidation involving the left midlung and left base.  Cannot rule out pulmonary contusion, pneumonia or aspiration.  There is associated volume loss of the left hemithorax.  Suggest further evaluation with CT of the chest 3.  No acute findings within the abdomen or pelvis.  Original Report Authenticated By: Rosealee Albee, M.D.   Dg Chest Port 1 View  08/13/2011  *RADIOLOGY REPORT*  Clinical Data: Short of breath  PORTABLE CHEST - 1 VIEW  Comparison: Chest radiograph 08/11/2011  Findings: Interval extubation.  Right PICC line remains unchanged. Stable enlarged heart silhouette.  There are is ani interstitial edema pattern not changed  from prior.  Basilar atelectasis may be slightly increased.  IMPRESSION:  1.  Slight increase in basilar atelectasis following extubation. 2.  Stable interstitial edema.  Original Report Authenticated By: Genevive Bi, M.D.   Dg Chest Port 1 View  08/11/2011  *RADIOLOGY REPORT*  Clinical Data: Respiratory failure  PORTABLE CHEST - 1 VIEW  Comparison:   the previous day's study  Findings: Endotracheal tube, nasogastric tube, and right arm PICC are stable in position.  Heart size upper limits normal for technique.  Relatively low lung volumes.  Mild interstitial airspace opacities throughout both lungs not convincingly changed from previous exam given differences in degree of inspiration.  No effusion evident.  IMPRESSION:  1.  Slightly improved aeration.  Otherwise no convincing change in bilateral infiltrates/edema. 2. Support hardware stable in position.  Original Report Authenticated By: Osa Craver, M.D.   Dg Chest Port 1 View  08/10/2011  *RADIOLOGY REPORT*  Clinical Data: Respiratory failure.  Multiple rib fractures.  PORTABLE CHEST - 1 VIEW  Comparison: 08/09/2011.  Findings: Endotracheal tube, nasogastric tube and right upper extremity PICC appear unchanged.  Today's exam is under penetrated.  Interval removal of the left thoracostomy tube.  No left-sided pneumothorax is identified.  Left lung patchy airspace density is present in conjunction with pleural reaction from prior chest tube. Cardiopericardial silhouette is enlarged.  Multiple rib fractures noted. Left lower rib fractures again noted.  IMPRESSION:  1.  Interval removal of left thoracostomy tube.  No pneumothorax. 2.  Other support apparatus stable. 3.  Unchanged pulmonary aeration.  Original Report Authenticated By: Andreas Newport, M.D.   Dg Chest Port 1 View  08/09/2011  *RADIOLOGY REPORT*  Clinical Data: Pneumonia and rib fracture.  PORTABLE CHEST - 1 VIEW  Comparison: 08/08/2011  Findings: The patient is rotated to the right.   The endotracheal tube and left chest tube are stable.  There has been placement of a right subclavian CVP and the tip is located overlying the superior vena cava in the region of the superior cavoatrial junction.  Cardiomegaly is felt to be stable given the degree of rotation present.  And prominence of the superior mediastinum is felt to be due to projection.  Pulmonary vascular congestion without overt congestive failure is seen.  Increased density at the  left base is again noted but has improved slightly since the previous exam. There is no definite residual pneumothorax or pleural fluid seen.  No new areas of atelectasis or infiltrate are noted with minimal subsegmental atelectasis at the right lung base.  Displaced fracture of the lateral aspect of the left ninth rib is again noted  IMPRESSION: Lines and tubes as above.  Improving left lower lobe infiltrate with no residual pneumothorax or pleural fluid is seen on the left.  Original Report Authenticated By: Bertha Stakes, M.D.   Dg Chest Portable 1 View  08/08/2011  *RADIOLOGY REPORT*  Clinical Data: Chest tube insertion.  PORTABLE CHEST - 1 VIEW  Comparison: Chest x-ray 08/07/2011 at 08:01 p.m.  Findings: A left-sided chest tube has been placed.  Pleural air is evident at the left lung apex.  Chest wall emphysema is evident.  A left pleural effusion is not significantly changed.  Mild pulmonary vascular congestion on the right is stable.  Left-sided rib fractures are again noted.  IMPRESSION:  1.  Interval placement of a left-sided chest tube with some residual pleural air and pneumothorax. 2.  Stable left pleural effusion. 3.  Chest wall emphysema.  Of note, the study was not evident on the [facility] PACS until after 3 o'clock p.m. 08/08/2011  Original Report Authenticated By: Jamesetta Orleans. MATTERN, M.D.   Dg Chest Port 1 View  08/08/2011  *RADIOLOGY REPORT*  Clinical Data: Chest tube.  Rib fractures.  PORTABLE CHEST - 1 VIEW  Comparison: 1 day prior   Findings: Endotracheal tube unchanged with tip 4.5 cm above carina. Left-sided chest tube unchanged in position.  The previously questioned tiny left apical pneumothorax is not appreciated. Decreased tiny amount of subcutaneous emphysema adjacent the left- sided chest tube.  Cardiomegaly accentuated by AP portable technique.  Small left pleural effusion.  Left-sided rib fractures.  Right lung clear. Slight worsening left base air space disease.  IMPRESSION:  1.  Left chest tube remains in place without evidence of residual pneumothorax. 2.  Slight worsening left-sided aeration with increased atelectasis and similar small volume left pleural effusion.  Original Report Authenticated By: Consuello Bossier, M.D.   Dg Chest Port 1 View  08/08/2011  *RADIOLOGY REPORT*  Clinical Data: Post intubation.  PORTABLE CHEST - 1 VIEW  Comparison: 08/07/2011  Findings: Endotracheal tube tip positioned 1.8 cm proximal to the carina.  Prominent cardiomediastinal contours. Left-sided chest tube in place.  Retrocardiac consolidation.  Tiny left apical pneumothorax is suggested.  There are multiple left lower lateral rib fractures.  Right lung is predominately clear. Small amount of subcutaneous gas along the left hemithorax.  IMPRESSION: Endotracheal tube tip 1.8 cm proximal to the carina.  Retrocardiac opacity may reflect atelectasis or aspiration.  Left-sided chest tube with suggestion of tiny apical pneumothorax.  Multiple left lateral rib fractures.  Original Report Authenticated By: Waneta Martins, M.D.   Dg Chest Portable 1 View  08/07/2011  *RADIOLOGY REPORT*  Clinical Data: Fall.  Short of breath.  Pneumonia.  PORTABLE CHEST - 1 VIEW  Comparison: 08/07/2011.  Findings: Worsening left-sided pulmonary aeration suggesting increasing pleural fluid and in the setting of trauma increasing left hemothorax.  Increasing atelectasis in the right lung.  There is probably also superimposed interstitial pulmonary edema.  The  cardiopericardial silhouette is partially obscured.  IMPRESSION: Increasing opacification of the left hemithorax, compatible with increasing hemothorax in the setting of trauma.  Only a small portion of the left upper lobe remains aerated.  Original Report Authenticated By: Andreas Newport, M.D.   Other results:    Patient Active Hospital Problem List: SYNCOPE (05/01/2009)   Assessment: The description of the patient's syncopal episode is consistent with cardiogenic rather than neurogenic syncope.    Plan: I will order a 2-D echocardiogram on the patient. Continue to monitor her on telemetry monitor and consider cardiology consult.     MATTHEWS,MICHELLE A. 08/14/2011, 7:59 PM

## 2011-08-14 NOTE — Progress Notes (Signed)
Trauma Service Note  Subjective: The patient had a syncopal episode and has not had a sufficient w/u.  Will start with carotid Doppler studies.  He is in no distress but confabulating a bit  Objective: Vital signs in last 24 hours: Temp:  [98 F (36.7 C)-98.7 F (37.1 C)] 98.7 F (37.1 C) (08/07 0815) Pulse Rate:  [69-92] 90  (08/07 0803) Resp:  [15-26] 20  (08/07 0803) BP: (103-132)/(65-77) 119/65 mmHg (08/07 0803) SpO2:  [89 %-97 %] 94 % (08/07 0803) Weight:  [106 kg (233 lb 11 oz)] 106 kg (233 lb 11 oz) (08/07 0454)    Intake/Output from previous day: 08/06 0701 - 08/07 0700 In: 837 [P.O.:837] Out: 2200 [Urine:2200] Intake/Output this shift:    General: No distress  Lungs: Clear but shallow breathing  Abd: Soft, benign  Extremities: No changes  Neuro: Seems confused.  Lab Results: CBC   Basename 08/14/11 0400 08/12/11 0303  WBC 11.6* 15.7*  HGB 14.1 13.9  HCT 41.3 40.3  PLT 223 206   BMET  Basename 08/14/11 0400 08/13/11 0510  NA 139 136  K 2.8* 3.2*  CL 98 98  CO2 32 31  GLUCOSE 90 88  BUN 18 13  CREATININE 0.69 0.57  CALCIUM 8.8 8.7   PT/INR No results found for this basename: LABPROT:2,INR:2 in the last 72 hours ABG No results found for this basename: PHART:2,PCO2:2,PO2:2,HCO3:2 in the last 72 hours  Studies/Results: Dg Chest Port 1 View  08/13/2011  *RADIOLOGY REPORT*  Clinical Data: Short of breath  PORTABLE CHEST - 1 VIEW  Comparison: Chest radiograph 08/11/2011  Findings: Interval extubation.  Right PICC line remains unchanged. Stable enlarged heart silhouette.  There are is ani interstitial edema pattern not changed from prior.  Basilar atelectasis may be slightly increased.  IMPRESSION:  1.  Slight increase in basilar atelectasis following extubation. 2.  Stable interstitial edema.  Original Report Authenticated By: Genevive Bi, M.D.    Anti-infectives: Anti-infectives     Start     Dose/Rate Route Frequency Ordered Stop   08/13/11  2000   moxifloxacin (AVELOX) tablet 400 mg        400 mg Oral Daily 08/13/11 0844     08/08/11 2300   vancomycin (VANCOCIN) IVPB 1000 mg/200 mL premix  Status:  Discontinued        1,000 mg 200 mL/hr over 60 Minutes Intravenous Every 12 hours 08/08/11 0954 08/11/11 1539   08/08/11 1100   vancomycin (VANCOCIN) 2,500 mg in sodium chloride 0.9 % 500 mL IVPB        2,500 mg 250 mL/hr over 120 Minutes Intravenous  Once 08/08/11 0954 08/08/11 1443   08/08/11 1000   moxifloxacin (AVELOX) IVPB 400 mg  Status:  Discontinued        400 mg 250 mL/hr over 60 Minutes Intravenous Every 24 hours 08/08/11 0847 08/13/11 0844          Assessment/Plan: s/p  Advance diet to be seen by PT today. Carotid Doppler studies Had episode of Afib with RVR--?etiology of syncope.  Currently in NSR   LOS: 7 days   Marta Lamas. Gae Bon, MD, FACS [phone] Trauma Surgeon 08/14/2011

## 2011-08-14 NOTE — Progress Notes (Signed)
VASCULAR LAB PRELIMINARY  PRELIMINARY  PRELIMINARY  PRELIMINARY  Bilateral lower extremity venous duplex and Carotid duplex completed.    Preliminary report:  1)VENOUS Right:  DVT noted in the gastrocnemius vein.  There is no extension into the popliteal vein .  No evidence of superficial thrombosis.  No Baker's cyst.  Left:  No evidence of DVT, superficial thrombosis, or Baker's cyst.    2)CAROTID Bilateral:  No evidence of hemodynamically significant internal carotid artery stenosis.   Vertebral artery flow is antegrade.      Saarah Dewing, RVT 08/14/2011, 1:08 PM

## 2011-08-14 NOTE — Consult Note (Signed)
Physical Medicine and Rehabilitation Consult Reason for Consult:weakness, confusion Referring Physician: trauma team   HPI: Jeffrey Sweeney is a 66 y.o. male transfered from [facility]. He apparently had a syncopal episode on07/30/13 and fell on his left side. He presented to the hospital 08/07/11 with left chest pain. He was found to have multiple rib fractures so was transferred to the hospital to [facility] for admission to the trauma service. Upon arrival , he was found to be in worsening respiratory distress. An emergent chest x-ray showed the possibility of hemothorax versus collapse. A chest tube was inserted by the emergency room physician urgently. Only a mild amount of hemothorax was relieved. The patient still has ongoing shortness of breath. He denies headache, neck pain, or abdominal pain. Prior to transfer, he had a negative CAT scan of his head and abdomen. CCM was consulted and bronchoscopy lavage done for  large amount of purulent secretions. Patient intubated thorough  08/04. [place] PNA treated with avelox. Fluid overload treated with diuretics. Rapid afib treated with  Lopressor. Patient has had confusion with confabulation.  Carotid doppler ordered to work up syncope. No ICA stenosis noted. BLE dopplers with DVT in gastrocnemius vein. OT evaluation done today and noted to be disoriented with difficulty following commands.  CIR recommended for progression.    ROS: Denies pain, sob, occasional cough, confusion A 12 point review of systems has been performed and if not noted above is otherwise negative.    Past Medical History  Diagnosis Date  . Heart disease   . CAD (coronary artery disease)   . Stroke     left arm weakness  . Seizures   . Depression    Past Surgical History  Procedure Date  . Ankle fracture surgery    Family History  Problem Relation Age of Onset  . Cancer Mother   . Cancer Father    Social History: Lives alone. Independent.  reports that he  has been smoking Cigarettes.  He has a 25 pack-year smoking history. He has never used smokeless tobacco. History of alcohol abuse.  He reports that he does not drink alcohol or use illicit drugs.  Allergies  Allergen Reactions  . Meperidine Hcl    Medications Prior to Admission  Medication Sig Dispense Refill  . albuterol (PROAIR HFA) 108 (90 BASE) MCG/ACT inhaler Inhale 2 puffs into the lungs 4 (four) times daily.      Marland Kitchen ALPRAZolam (XANAX) 1 MG tablet Take 1 mg by mouth 3 (three) times daily as needed. For anxiety      . amLODipine (NORVASC) 5 MG tablet Take 5 mg by mouth daily.      Marland Kitchen atorvastatin (LIPITOR) 40 MG tablet Take 40 mg by mouth at bedtime.       . Eszopiclone (ESZOPICLONE) 3 MG TABS Take 3 mg by mouth at bedtime. Take immediately before bedtime      . HYDROcodone-acetaminophen (NORCO) 7.5-325 MG per tablet Take 1 tablet by mouth 4 (four) times daily.      Marland Kitchen levETIRAcetam (KEPPRA) 1000 MG tablet Take 1,000 mg by mouth 3 (three) times daily.      . nitroGLYCERIN (NITROSTAT) 0.4 MG SL tablet Place 0.4 mg under the tongue every 5 (five) minutes as needed. Chest pain      . pantoprazole (PROTONIX) 40 MG tablet Take 40 mg by mouth daily.        . phenelzine (NARDIL) 15 MG tablet Take 2 tablets (30 mg total) by mouth 3 (  three) times daily.  180 tablet  1  . potassium chloride 20 MEQ/15ML (10%) SOLN Take 20 mEq by mouth daily.      . Valproic Acid (DEPAKENE) 250 MG/5ML SYRP syrup Take 500 mg by mouth daily.         Home: Home Living Lives With: Son Available Help at Discharge: Family;Available PRN/intermittently (sister lives 45 minutes away; son works) Type of Home: Mobile home Home Access: Stairs to enter Secretary/administrator of Steps: 1  Entrance Stairs-Rails: Right;Left;Can reach both Home Layout: One level Bathroom Shower/Tub: Forensic scientist: Standard Bathroom Accessibility: Yes How Accessible: Accessible via walker Home Adaptive Equipment:  Walker - rolling;Bedside commode/3-in-1  Functional History: Prior Function Able to Take Stairs?: Yes Driving: Yes Vocation: On disability Functional Status:  Mobility: Bed Mobility Bed Mobility: Supine to Sit;Sitting - Scoot to Edge of Bed Supine to Sit: 5: Supervision;HOB elevated Sitting - Scoot to [place] of Bed: 5: Supervision Transfers Sit to Stand: 4: Min assist;With upper extremity assist;From chair/3-in-1 Stand to Sit: 4: Min assist;With upper extremity assist;To chair/3-in-1      ADL: ADL Grooming: Simulated;Set up Where Assessed - Grooming: Supported sitting Lower Body Dressing: Performed;Supervision/safety Where Assessed - Lower Body Dressing: Supported sitting Toilet Transfer: Simulated;Minimal Dentist Method: Sit to Barista: Regular height toilet Equipment Used: Gait belt;Rolling walker Transfers/Ambulation Related to ADLs: Pt. with balance deficits and required Min A for ambulation to chair. ADL Comments: pt attempting to exit chair alone with family in the room visiting. Chair alarm sounding and family requesting assistance. Pt verbalized desire to return to bed however family were advised by RN to encourage upright sitting in chair. Pt expressed discomfort in chair. Pt completed sit<>stand Min (A) from chair with pillows placed to increase comfort. Pt required facilitation to assist with weight shifting to scoot in chair and tactile input to sequence task. pt with cognition deficits noted. Pt with decreased oxygen saturations to 85% on 4L during chair mobility.  Cognition: Cognition Arousal/Alertness: Awake/alert Orientation Level: Oriented to person;Oriented to place;Oriented to situation Cognition Overall Cognitive Status: Impaired Area of Impairment: Following commands;Safety/judgement;Awareness of errors Arousal/Alertness: Awake/alert Orientation Level: Disoriented to;Place Behavior During Session: [facility] for tasks  performed Following Commands: Follows one step commands inconsistently Safety/Judgement: Decreased safety judgement for tasks assessed Awareness of Errors: Assistance required to identify errors made  Blood pressure 115/67, pulse 85, temperature 98.4 F (36.9 C), temperature source Oral, resp. rate 23, height 5\' 8"  (1.727 m), weight 106 kg (233 lb 11 oz), SpO2 94.00%.  Physical Exam:  General: Alert  . No apparent distress HEENT: Head is normocephalic, atraumatic, PERRLA, EOMI, sclera anicteric, oral mucosa pink and moist, dentition intact, ext ear canals clear,  Neck: Supple without JVD or lymphadenopathy Heart: Reg rate and rhythm. No murmurs rubs or gallops Chest: CTA bilaterally without wheezes, rales, or rhonchi; no distress Abdomen: Soft, non-tender, non-distended, bowel sounds positive. Extremities: No clubbing, cyanosis, or edema. Pulses are 2+ Skin: Clean and intact without signs of breakdown Neuro: Pt follows simple commands. Poor memory and insight. Oriented to name only. Cranial nerves 2-12 are intact. Sensory exam is normal. Reflexes are 2+ in all 4's. Fine motor coordination is intact. No tremors. Motor function is grossly 4-5/5  Musculoskeletal: reasonable ROM, No pain with AROM or PROM in the neck, trunk, or extremities. Posture appropriate Psych: Pt's affect is appropriate. Pt is cooperative and pleasant.    Results for orders placed during the hospital encounter of 08/07/11 (from  the past 24 hour(s))  TSH     Status: Normal   Collection Time   08/13/11  6:15 PM      Component Value Range   TSH 2.199  0.350 - 4.500 uIU/mL  T4, FREE     Status: Normal   Collection Time   08/13/11  6:15 PM      Component Value Range   Free T4 0.85  0.80 - 1.80 ng/dL  BASIC METABOLIC PANEL     Status: Abnormal   Collection Time   08/14/11  4:00 AM      Component Value Range   Sodium 139  135 - 145 mEq/L   Potassium 2.8 (*) 3.5 - 5.1 mEq/L   Chloride 98  96 - 112 mEq/L   CO2 32  19 -  32 mEq/L   Glucose, Bld 90  70 - 99 mg/dL   BUN 18  6 - 23 mg/dL   Creatinine, Ser 9.14  0.50 - 1.35 mg/dL   Calcium 8.8  8.4 - 78.2 mg/dL   GFR calc non Af Amer >90  >90 mL/min   GFR calc Af Amer >90  >90 mL/min  CBC     Status: Abnormal   Collection Time   08/14/11  4:00 AM      Component Value Range   WBC 11.6 (*) 4.0 - 10.5 K/uL   RBC 4.44  4.22 - 5.81 MIL/uL   Hemoglobin 14.1  13.0 - 17.0 g/dL   HCT 95.6  21.3 - 08.6 %   MCV 93.0  78.0 - 100.0 fL   MCH 31.8  26.0 - 34.0 pg   MCHC 34.1  30.0 - 36.0 g/dL   RDW 57.8  46.9 - 62.9 %   Platelets 223  150 - 400 K/uL   Dg Chest Port 1 View  08/13/2011  *RADIOLOGY REPORT*  Clinical Data: Short of breath  PORTABLE CHEST - 1 VIEW  Comparison: Chest radiograph 08/11/2011  Findings: Interval extubation.  Right PICC line remains unchanged. Stable enlarged heart silhouette.  There are is ani interstitial edema pattern not changed from prior.  Basilar atelectasis may be slightly increased.  IMPRESSION:  1.  Slight increase in basilar atelectasis following extubation. 2.  Stable interstitial edema.  Original Report Authenticated By: Genevive Bi, M.D.    Assessment/Plan: Diagnosis: Deconditioning related to pneumonia, respiratory failure; encephalopathy 1. Does the need for close, 24 hr/day medical supervision in concert with the patient's rehab needs make it unreasonable for this patient to be served in a less intensive setting? No 2. Co-Morbidities requiring supervision/potential complications: see above 3. Due to bladder management, bowel management, safety, skin/wound care, disease management and medication administration, does the patient require 24 hr/day rehab nursing? No 4. Does the patient require coordinated care of a physician, rehab nurse, PT (1-2 hrs/day, 5 days/week), OT (1-2 hrs/day, 5 days/week) and SLP (1-2 hrs/day, 5 days/week) to address physical and functional deficits in the context of the above medical diagnosis(es)?  No Addressing deficits in the following areas: balance, endurance, locomotion, strength, bowel/bladder control, bathing, dressing, grooming, cognition and psychosocial support 5. Can the patient actively participate in an intensive therapy program of at least 3 hrs of therapy per day at least 5 days per week? Potentially 6. The potential for patient to make measurable gains while on inpatient rehab is fair 7. Anticipated functional outcomes upon discharge from inpatient rehab are supervision with PT, supervision with OT, supervision with SLP. 8. Estimated rehab length of stay to  reach the above functional goals is: n/a 9. Does the patient have adequate social supports to accommodate these discharge functional goals? No 10. Anticipated D/C setting: Home 11. Anticipated post D/C treatments: HH therapy 12. Overall Rehab/Functional Prognosis: good  RECOMMENDATIONS: This patient's condition is appropriate for continued rehabilitative care in the following setting: SNF Patient has agreed to participate in recommended program. Potentially Note that insurance prior authorization may be required for reimbursement for recommended care.  Comment: Pt is at a supervision level essentially. He will likely be at that level for some time, and an inpatient rehab admission would not change that.  Ivory Broad, MD    08/14/2011

## 2011-08-14 NOTE — Progress Notes (Signed)
Occupational Therapy Evaluation Patient Details Name: Jeffrey Sweeney MRN: [id number] DOB: 28-Mar-1945 Today's Date: 08/14/2011 Time: 7829-5621 OT Time Calculation (min): 31 min  OT Assessment / Plan / Recommendation Clinical Impression  66 y.o. pt. who presented after syncopal episode and fell on his left side. He presented with multiple rib fractures and chest pain. Pt. with balance deficits and decreased safety awareness during session. Pt. With deficits in LUE due to previous CVA.  Pt. Will benefit from OT to maximize independence and safety in ADLs prior to d/c. Would recommend CIR.     OT Assessment  Patient needs continued OT Services    Follow Up Recommendations  Inpatient Rehab    Barriers to Discharge      Equipment Recommendations  Defer to next venue    Recommendations for Other Services    Frequency  Min 2X/week    Precautions / Restrictions Precautions Precautions: Fall Restrictions Weight Bearing Restrictions: No   Pertinent Vitals/Pain Pain in lower back 8/10. O2 sats decreased to 80's during session while on 4 L of O2. Educated on pursed lip breathing.     ADL  Grooming: Simulated;Set up Where Assessed - Grooming: Supported sitting Lower Body Dressing: Performed;Supervision/safety Where Assessed - Lower Body Dressing: Supported sitting Toilet Transfer: Simulated;Minimal Dentist Method: Sit to Barista: Regular height toilet Equipment Used: Gait belt;Rolling walker Transfers/Ambulation Related to ADLs: Pt. with balance deficits and required Min A for ambulation to chair. ADL Comments: Pt. performed sit to stand transfer from bed with Min A with x2 trials to stand. Pt. O2 sats decreased to 80's while on 4 L of O2. OT educated pt. on pursed lip breathing. Pt. with prior stroke and deficits LUE. Pt. with decreased proprioception, limited ROM, and decreased strength. OT educated pt. to use LUE with activities. Pt. able to  don socks with Supervision but O2 dropped with this activity and pt. demonstrated dyspnea 3/5. Pt. with balance deficits.     OT Diagnosis: Generalized weakness;Acute pain  OT Problem List: Decreased strength;Decreased range of motion;Decreased activity tolerance;Impaired balance (sitting and/or standing);Decreased knowledge of use of DME or AE;Decreased safety awareness;Cardiopulmonary status limiting activity OT Treatment Interventions: Self-care/ADL training;DME and/or AE instruction;Therapeutic activities;Patient/family education;Balance training   OT Goals Acute Rehab OT Goals OT Goal Formulation: With patient Time For Goal Achievement: 08/28/11 Potential to Achieve Goals: Good ADL Goals Pt Will Perform Grooming: Standing at sink;with set-up ADL Goal: Grooming - Progress: Goal set today Pt Will Perform Lower Body Bathing: Sit to stand from chair;with supervision ADL Goal: Lower Body Bathing - Progress: Goal set today Pt Will Perform Lower Body Dressing: with supervision;Sit to stand from chair;Sit to stand from bed ADL Goal: Lower Body Dressing - Progress: Goal set today Pt Will Transfer to Toilet: with supervision;Ambulation;Regular height toilet ADL Goal: Toilet Transfer - Progress: Goal set today Pt Will Perform Toileting - Clothing Manipulation: with supervision;Standing ADL Goal: Toileting - Clothing Manipulation - Progress: Goal set today Pt Will Perform Toileting - Hygiene: with supervision;Sit to stand from 3-in-1/toilet ADL Goal: Toileting - Hygiene - Progress: Goal set today Pt Will Perform Tub/Shower Transfer: Tub transfer;Shower seat with back;with supervision ADL Goal: Tub/Shower Transfer - Progress: Goal set today  Visit Information  Last OT Received On: 08/14/11 Assistance Needed: +1    Subjective Data  Subjective: We are at [facility].   Prior Functioning  Vision/Perception  Home Living Lives With: Son Available Help at Discharge: Family;Available  PRN/intermittently (sister lives 45 minutes  away; son works) Type of Home: Mobile home Home Access: Stairs to enter Secretary/administrator of Steps: 1  Entrance Stairs-Rails: Right;Left;Can reach both Home Layout: One level Bathroom Shower/Tub: Forensic scientist: Standard Bathroom Accessibility: Yes How Accessible: Accessible via walker Home Adaptive Equipment: Walker - rolling;Bedside commode/3-in-1 Prior Function Level of Independence: Independent with assistive device(s) Able to Take Stairs?: Yes Driving: Yes Vocation: On disability Communication Communication: No difficulties Dominant Hand: Right      Cognition  Overall Cognitive Status: Impaired Area of Impairment: Following commands;Safety/judgement;Awareness of errors Arousal/Alertness: Awake/alert Orientation Level: Disoriented to;Place Behavior During Session: [facility] for tasks performed Following Commands: Follows one step commands inconsistently Safety/Judgement: Decreased safety judgement for tasks assessed Awareness of Errors: Assistance required to identify errors made    Extremity/Trunk Assessment Right Upper Extremity Assessment RUE ROM/Strength/Tone: Within functional levels RUE Sensation: WFL - Light Touch Left Upper Extremity Assessment LUE ROM/Strength/Tone: Deficits LUE ROM/Strength/Tone Deficits: shoulder ROM apprx 90 degrees. Decreased grip strength 3+/5. LUE Sensation: Deficits LUE Sensation Deficits: decreased proprioception and motor planning   Mobility Bed Mobility Bed Mobility: Supine to Sit;Sitting - Scoot to Edge of Bed Supine to Sit: 5: Supervision;HOB elevated Sitting - Scoot to [place] of Bed: 5: Supervision Transfers Transfers: Sit to Stand;Stand to Sit Sit to Stand: 4: Min assist;With upper extremity assist;From bed Stand to Sit: 4: Min assist;With upper extremity assist;To chair/3-in-1 Details for Transfer Assistance: Pt. needed vc's for sequencing and to push from bed  when standing. Pt. with uncontrolled decent to chair and sitting prematurely with decreased safety awareness.            End of Session OT - End of Session Activity Tolerance: Patient tolerated treatment well Patient left: in chair;with call bell/phone within reach;with chair alarm set  GO     Jenell Milliner 08/14/2011, 9:43 AM

## 2011-08-14 NOTE — Evaluation (Signed)
Physical Therapy Evaluation Patient Details Name: Jeffrey Sweeney MRN: [id number] DOB: 11/17/1945 Today's Date: 08/14/2011 Time: 4098-1191 PT Time Calculation (min): 15 min  PT Assessment / Plan / Recommendation Clinical Impression  pt adm after fall due to syncope sustaining rib fx's .  On eval, pt determined to be mildly weak and uncoordinated on L LE  with decr balance and gait instability.  Rec CIR for PT    PT Assessment  Patient needs continued PT services    Follow Up Recommendations  Inpatient Rehab    Barriers to Discharge        Equipment Recommendations  Defer to next venue    Recommendations for Other Services     Frequency Min 3X/week    Precautions / Restrictions Precautions Precautions: Fall Restrictions Weight Bearing Restrictions: No   Pertinent Vitals/Pain       Mobility  Bed Mobility Bed Mobility: Rolling Left;Left Sidelying to Sit;Sitting - Scoot to Edge of Bed;Sit to Supine Rolling Left: 4: Min guard Left Sidelying to Sit: 4: Min guard;With rails;HOB flat Sitting - Scoot to Edge of Bed: 4: Min guard Sit to Supine: 4: Min guard Details for Bed Mobility Assistance: no assist needed, but min guard for safety Transfers Transfers: Sit to Stand;Stand to Sit Sit to Stand: 4: Min assist;From bed Stand to Sit: 4: Min assist;To bed Details for Transfer Assistance: impulsive, but did use hands appropriately, reinforced safety issues to him; stability assist Ambulation/Gait Ambulation/Gait Assistance: 4: Min assist Ambulation Distance (Feet): 10 Feet (back to bed once realized pt had R LE DVT ( overlooked)) Assistive device: None Ambulation/Gait Assistance Details: moderately unsteady with staggered steps, but no overt LOB; min stability assist Gait Pattern: Step-through pattern;Decreased step length - right;Decreased step length - left;Scissoring;Wide base of support Stairs: No Wheelchair Mobility Wheelchair Mobility: No    Exercises     PT  Diagnosis: Generalized weakness;Acute pain;Difficulty walking  PT Problem List: Decreased strength;Decreased balance;Decreased activity tolerance;Decreased coordination;Decreased safety awareness PT Treatment Interventions: Gait training;Functional mobility training;Therapeutic activities;Balance training;Patient/family education   PT Goals Acute Rehab PT Goals PT Goal Formulation: With patient Time For Goal Achievement: 08/14/11 Potential to Achieve Goals: Good Pt will go Supine/Side to Sit: with modified independence;with HOB 0 degrees PT Goal: Supine/Side to Sit - Progress: Goal set today Pt will go Sit to Stand: with supervision PT Goal: Sit to Stand - Progress: Goal set today Pt will Transfer Bed to Chair/Chair to Bed: with supervision PT Transfer Goal: Bed to Chair/Chair to Bed - Progress: Goal set today Pt will Ambulate: 51 - 150 feet;with supervision;with least restrictive assistive device PT Goal: Ambulate - Progress: Goal set today  Visit Information  Last PT Received On: 08/14/11 Assistance Needed: +1    Subjective Data  Subjective: Yeah I'd love to get up Patient Stated Goal: get home   Prior Functioning  Home Living Lives With: Son Available Help at Discharge: Family;Available PRN/intermittently Type of Home: Mobile home Home Access: Stairs to enter Entrance Stairs-Number of Steps: 1  Entrance Stairs-Rails: Right;Left;Can reach both Home Layout: One level Bathroom Shower/Tub: Forensic scientist: Standard Bathroom Accessibility: Yes How Accessible: Accessible via walker Home Adaptive Equipment: Walker - rolling;Bedside commode/3-in-1 Prior Function Level of Independence: Independent with assistive device(s) Able to Take Stairs?: Yes Driving: Yes Vocation: On disability Communication Communication: No difficulties Dominant Hand: Right    Cognition  Overall Cognitive Status: Impaired Area of Impairment: Following  commands;Safety/judgement;Awareness of errors Arousal/Alertness: Awake/alert Orientation Level: Disoriented to;Place Behavior During Session:  WFL for tasks performed Following Commands: Follows one step commands consistently Safety/Judgement: Decreased safety judgement for tasks assessed    Extremity/Trunk Assessment Right Lower Extremity Assessment RLE ROM/Strength/Tone: Within functional levels (5/5 overall) RLE Coordination: WFL - gross/fine motor Left Lower Extremity Assessment LLE ROM/Strength/Tone: Within functional levels;Deficits LLE ROM/Strength/Tone Deficits: mild coordination deficits, grossly 4+/5   Balance Balance Balance Assessed: No  End of Session PT - End of Session Activity Tolerance: Patient tolerated treatment well Patient left: in bed;with call bell/phone within reach Nurse Communication: Mobility status  GP     Juni Glaab, Eliseo Gum 08/14/2011, 5:06 PM  08/14/2011  [place] Bing, PT [phone] (pager)

## 2011-08-14 NOTE — Clinical Social Work Note (Signed)
Clinical Social Worker continuing to follow patient for emotional support and discharge planning needs.  [name] attempted to visit with patient at bedside who is oriented to self only.  [name] contacted patient daughter over the phone to discuss patient discharge plans further.  Patient daughter states that patient has been to [facility] in the past and the family is not willing to have patient return to SNF placement.  Patient family is agreeable to inpatient rehab option and if not approved would like to take patient home with home health following - MD notified.  Patient family is able to provide 24 hour support upon discharge.  Clinical Social Worker will continue to follow to offer support and confirm patient discharge plans once medically ready.  Macario Golds, [place] [phone]

## 2011-08-14 NOTE — Progress Notes (Signed)
Occupational Therapy Treatment Patient Details Name: Jeffrey Sweeney MRN: [id number] DOB: 12-13-1945 Today's Date: 08/14/2011 Time: 1100-1110 OT Time Calculation (min): 10 min  OT Assessment / Plan / Recommendation Comments on Treatment Session Pt attempting mobility without assistance due to cognitive deficits. OT assisting patient with positioning and fall prevention    Follow Up Recommendations  Inpatient Rehab    Barriers to Discharge       Equipment Recommendations  Defer to next venue    Recommendations for Other Services Rehab consult  Frequency Min 2X/week   Plan Discharge plan remains appropriate    Precautions / Restrictions Precautions Precautions: Fall Restrictions Weight Bearing Restrictions: No   Pertinent Vitals/Pain NONE AFTER reposition    ADL   Transfers/Ambulation Related to ADLs: Pt. with balance deficits and required Min A for ambulation to chair. ADL Comments: pt attempting to exit chair alone with family in the room visiting. Chair alarm sounding and family requesting assistance. Pt verbalized desire to return to bed however family were advised by RN to encourage upright sitting in chair. Pt expressed discomfort in chair. Pt completed sit<>stand Min (A) from chair with pillows placed to increase comfort. Pt required facilitation to assist with weight shifting to scoot in chair and tactile input to sequence task. pt with cognition deficits noted. Pt with decreased oxygen saturations to 85% on 4L during chair mobility.    OT Diagnosis: Generalized weakness;Acute pain  OT Problem List: Decreased strength;Decreased range of motion;Decreased activity tolerance;Impaired balance (sitting and/or standing);Decreased knowledge of use of DME or AE;Decreased safety awareness;Cardiopulmonary status limiting activity OT Treatment Interventions: Self-care/ADL training;DME and/or AE instruction;Therapeutic activities;Patient/family education;Balance training   OT  Goals Acute Rehab OT Goals OT Goal Formulation: With patient Time For Goal Achievement: 08/28/11 Potential to Achieve Goals: Good ADL Goals Pt Will Perform Grooming: Standing at sink;with set-up ADL Goal: Grooming - Progress: Goal set today Pt Will Perform Lower Body Bathing: Sit to stand from chair;with supervision ADL Goal: Lower Body Bathing - Progress: Goal set today Pt Will Perform Lower Body Dressing: with supervision;Sit to stand from chair;Sit to stand from bed ADL Goal: Lower Body Dressing - Progress: Goal set today Pt Will Transfer to Toilet: with supervision;Ambulation;Regular height toilet ADL Goal: Toilet Transfer - Progress: Goal set today Pt Will Perform Toileting - Clothing Manipulation: with supervision;Standing ADL Goal: Toileting - Clothing Manipulation - Progress: Goal set today Pt Will Perform Toileting - Hygiene: with supervision;Sit to stand from 3-in-1/toilet ADL Goal: Toileting - Hygiene - Progress: Goal set today Pt Will Perform Tub/Shower Transfer: Tub transfer;Shower seat with back;with supervision ADL Goal: Tub/Shower Transfer - Progress: Goal set today  Visit Information  Last OT Received On: 08/14/11 Assistance Needed: +1    Subjective Data  Subjective: We are at [facility].   Prior Functioning  Home Living Lives With: Son Available Help at Discharge: Family;Available PRN/intermittently (sister lives 45 minutes away; son works) Type of Home: Mobile home Home Access: Stairs to enter Secretary/administrator of Steps: 1  Entrance Stairs-Rails: Right;Left;Can reach both Home Layout: One level Bathroom Shower/Tub: Forensic scientist: Standard Bathroom Accessibility: Yes How Accessible: Accessible via walker Home Adaptive Equipment: Walker - rolling;Bedside commode/3-in-1 Prior Function Level of Independence: Independent with assistive device(s) Able to Take Stairs?: Yes Driving: Yes Vocation: On  disability Communication Communication: No difficulties Dominant Hand: Right    Cognition  Overall Cognitive Status: Impaired Area of Impairment: Following commands;Safety/judgement;Awareness of errors Arousal/Alertness: Awake/alert Orientation Level: Disoriented to;Place Behavior During Session: [facility] for  tasks performed Following Commands: Follows one step commands inconsistently Safety/Judgement: Decreased safety judgement for tasks assessed Awareness of Errors: Assistance required to identify errors made       Exercises    Balance  SIt<>stand Min (A) - attempting to sit prematurely due to sustained attention ~10 seconds  End of Session OT - End of Session Activity Tolerance: Patient tolerated treatment well Patient left: in chair;with call bell/phone within reach;with chair alarm set  GO     Lucile Shutters 08/14/2011, 11:23 AM  Pager: [phone]

## 2011-08-15 ENCOUNTER — Inpatient Hospital Stay (HOSPITAL_COMMUNITY): Payer: Medicare Other

## 2011-08-15 DIAGNOSIS — G92 Toxic encephalopathy: Secondary | ICD-10-CM

## 2011-08-15 DIAGNOSIS — W19XXXA Unspecified fall, initial encounter: Secondary | ICD-10-CM

## 2011-08-15 DIAGNOSIS — J159 Unspecified bacterial pneumonia: Secondary | ICD-10-CM

## 2011-08-15 DIAGNOSIS — I517 Cardiomegaly: Secondary | ICD-10-CM

## 2011-08-15 DIAGNOSIS — S2239XA Fracture of one rib, unspecified side, initial encounter for closed fracture: Secondary | ICD-10-CM

## 2011-08-15 DIAGNOSIS — R5381 Other malaise: Secondary | ICD-10-CM

## 2011-08-15 LAB — CBC WITH DIFFERENTIAL/PLATELET
Basophils Absolute: 0.1 10*3/uL (ref 0.0–0.1)
Basophils Relative: 1 % (ref 0–1)
HCT: 40.6 % (ref 39.0–52.0)
Hemoglobin: 14.1 g/dL (ref 13.0–17.0)
Lymphocytes Relative: 27 % (ref 12–46)
Monocytes Absolute: 1 10*3/uL (ref 0.1–1.0)
Neutro Abs: 5.9 10*3/uL (ref 1.7–7.7)
Neutrophils Relative %: 57 % (ref 43–77)
RDW: 14.3 % (ref 11.5–15.5)
WBC: 10.3 10*3/uL (ref 4.0–10.5)

## 2011-08-15 LAB — BLOOD GAS, ARTERIAL
Acid-Base Excess: 6.2 mmol/L — ABNORMAL HIGH (ref 0.0–2.0)
Bicarbonate: 30.3 mEq/L — ABNORMAL HIGH (ref 20.0–24.0)
TCO2: 31.7 mmol/L (ref 0–100)
pCO2 arterial: 44.6 mmHg (ref 35.0–45.0)
pH, Arterial: 7.446 (ref 7.350–7.450)

## 2011-08-15 LAB — BASIC METABOLIC PANEL
CO2: 31 mEq/L (ref 19–32)
Chloride: 101 mEq/L (ref 96–112)
Creatinine, Ser: 0.7 mg/dL (ref 0.50–1.35)
GFR calc Af Amer: >90 mL/min (ref >90)
Potassium: 3.1 mEq/L — ABNORMAL LOW (ref 3.5–5.1)

## 2011-08-15 LAB — PROTIME-INR
INR: 1.15 (ref 0.00–1.49)
Prothrombin Time: 14.9 seconds (ref 11.6–15.2)

## 2011-08-15 LAB — PRO B NATRIURETIC PEPTIDE: Pro B Natriuretic peptide (BNP): 364.2 pg/mL — ABNORMAL HIGH (ref 0–125)

## 2011-08-15 MED ORDER — IOHEXOL 350 MG/ML SOLN
60.0000 mL | Freq: Once | INTRAVENOUS | Status: AC | PRN
  Administered 2011-08-15: 60 mL via INTRAVENOUS

## 2011-08-15 MED ORDER — WARFARIN SODIUM 7.5 MG PO TABS
7.5000 mg | ORAL_TABLET | Freq: Once | ORAL | Status: AC
  Administered 2011-08-15: 7.5 mg via ORAL
  Filled 2011-08-15: qty 1

## 2011-08-15 MED ORDER — ALTEPLASE 2 MG IJ SOLR
2.0000 mg | Freq: Once | INTRAMUSCULAR | Status: AC
  Administered 2011-08-15: 2 mg
  Filled 2011-08-15: qty 2

## 2011-08-15 MED ORDER — SODIUM CHLORIDE 0.9 % IV SOLN
INTRAVENOUS | Status: DC
  Administered 2011-08-15 – 2011-08-16 (×2): via INTRAVENOUS
  Filled 2011-08-15 (×6): qty 1000

## 2011-08-15 MED ORDER — POTASSIUM CHLORIDE CRYS ER 20 MEQ PO TBCR
40.0000 meq | EXTENDED_RELEASE_TABLET | Freq: Two times a day (BID) | ORAL | Status: DC
  Administered 2011-08-15 – 2011-08-19 (×9): 40 meq via ORAL
  Filled 2011-08-15 (×10): qty 2

## 2011-08-15 MED ORDER — FUROSEMIDE 10 MG/ML IJ SOLN
INTRAMUSCULAR | Status: AC
  Filled 2011-08-15: qty 4

## 2011-08-15 MED ORDER — FUROSEMIDE 10 MG/ML IJ SOLN
20.0000 mg | Freq: Two times a day (BID) | INTRAMUSCULAR | Status: DC
  Administered 2011-08-15 – 2011-08-19 (×9): 20 mg via INTRAVENOUS
  Filled 2011-08-15 (×9): qty 2

## 2011-08-15 MED ORDER — SODIUM CHLORIDE 0.9 % IJ SOLN
INTRAMUSCULAR | Status: AC
  Administered 2011-08-15: 10 mL
  Filled 2011-08-15: qty 10

## 2011-08-15 NOTE — Progress Notes (Signed)
Echocardiogram 2D Echocardiogram has been performed.  Jeffrey Sweeney 08/15/2011, 8:36 AM

## 2011-08-15 NOTE — Progress Notes (Signed)
Pt more lethargic today, lungs rhonchus, more on left up & low Q. Placed venti mask 50% on Pt. Called PA. New orders received, will continue to monitor.

## 2011-08-15 NOTE — Progress Notes (Signed)
UR completed 

## 2011-08-15 NOTE — Progress Notes (Signed)
Patient ID: Jeffrey Sweeney, male   DOB: April 18, 1945, 66 y.o.   MRN: [id number]   LOS: 8 days   Subjective: RN reports patient with progressive respiratory difficulties OVN. Also confused, less responsive. He denies complaints but is only oriented to person, says "I don't know" to every other question.   Objective: Vital signs in last 24 hours: Temp:  [98.2 F (36.8 C)-99.1 F (37.3 C)] 98.2 F (36.8 C) (08/08 0729) Pulse Rate:  [77-96] 77  (08/08 0413) Resp:  [18-24] 20  (08/08 0413) BP: (115-141)/(67-76) 130/73 mmHg (08/08 0413) SpO2:  [91 %-96 %] 96 % (08/08 0413) Weight:  [105.6 kg (232 lb 12.9 oz)] 105.6 kg (232 lb 12.9 oz) (08/08 0500)    I&O: +2875ml/admission   Lab Results:  CBC  Basename 08/15/11 0512 08/14/11 0400  WBC 10.3 11.6*  HGB 14.1 14.1  HCT 40.6 41.3  PLT 252 223   BMET  Basename 08/15/11 0512 08/14/11 0400  NA 142 139  K 3.1* 2.8*  CL 101 98  CO2 31 32  GLUCOSE 97 90  BUN 14 18  CREATININE 0.70 0.69  CALCIUM 8.6 8.8   Lab Results  Component Value Date   INR 1.15 08/15/2011   INR 0.94 08/07/2011   INR 0.89 02/07/2010    General appearance: no distress Resp: Diminished in bilateral bases Cardio: regular rate and rhythm GI: normal findings: bowel sounds normal and soft, non-tender Pulses: 2+ and symmetric Neurologic: Mental status: alertness: lethargic, orientation: person Motor: grossly normal   Assessment/Plan: Fall  Multiple left rib fxs -- Pain control and pulmonary toilet. Stat CXR shows some LLL ASD and bilateral interstitial markings, pulmonary edema secondary to CHF? Will get BNP, give lasix. Possible PE but since already treating because of DVT I don't think CT chest would change management. Check ABG, may need to intubate. Right calf DVT -- Lovenox + coumadin Syncope -- IM working it up, appreciate consult Multiple medical problems -- Home meds  Hypokalemia -- Will increase oral supplement, give iv runs again FEN -- Hold diet with  mental status decline, increase IVF. Nothing focal neurologically on exam but may need HCT if MS doesn't improve. Place foley to measure fluid balance, pt incontinent right now. Dispo -- Respiratory failure    Freeman Caldron, PA-C Pager: [phone] General Trauma PA Pager: [phone]   08/15/2011

## 2011-08-15 NOTE — Progress Notes (Addendum)
ANTICOAGULATION CONSULT NOTE - Initial Consult  Pharmacy Consult for coumadin Indication: DVT  Allergies  Allergen Reactions  . Meperidine Hcl     Patient Measurements: Height: 5\' 8"  (172.7 cm) Weight: 232 lb 12.9 oz (105.6 kg) IBW/kg (Calculated) : 68.4    Vital Signs: Temp: 98.2 F (36.8 C) (08/08 0729) Temp src: Oral (08/08 0729) BP: 124/74 mmHg (08/08 0729) Pulse Rate: 77  (08/08 0413)  Labs:  Basename 08/15/11 0512 08/14/11 0400 08/13/11 0510  HGB 14.1 14.1 --  HCT 40.6 41.3 --  PLT 252 223 --  APTT -- -- --  LABPROT 14.9 -- --  INR 1.15 -- --  HEPARINUNFRC -- -- --  CREATININE 0.70 0.69 0.57  CKTOTAL -- -- --  CKMB -- -- --  TROPONINI -- -- --    Estimated Creatinine Clearance: 108.5 ml/min (by C-G formula based on Cr of 0.7).   Medical History: Past Medical History  Diagnosis Date  . Heart disease   . CAD (coronary artery disease)   . Stroke     left arm weakness  . Seizures   . Depression     Medications:  Prescriptions prior to admission  Medication Sig Dispense Refill  . albuterol (PROAIR HFA) 108 (90 BASE) MCG/ACT inhaler Inhale 2 puffs into the lungs 4 (four) times daily.      Marland Kitchen ALPRAZolam (XANAX) 1 MG tablet Take 1 mg by mouth 3 (three) times daily as needed. For anxiety      . amLODipine (NORVASC) 5 MG tablet Take 5 mg by mouth daily.      Marland Kitchen atorvastatin (LIPITOR) 40 MG tablet Take 40 mg by mouth at bedtime.       . Eszopiclone (ESZOPICLONE) 3 MG TABS Take 3 mg by mouth at bedtime. Take immediately before bedtime      . HYDROcodone-acetaminophen (NORCO) 7.5-325 MG per tablet Take 1 tablet by mouth 4 (four) times daily.      Marland Kitchen levETIRAcetam (KEPPRA) 1000 MG tablet Take 1,000 mg by mouth 3 (three) times daily.      . nitroGLYCERIN (NITROSTAT) 0.4 MG SL tablet Place 0.4 mg under the tongue every 5 (five) minutes as needed. Chest pain      . pantoprazole (PROTONIX) 40 MG tablet Take 40 mg by mouth daily.        . phenelzine (NARDIL) 15 MG  tablet Take 2 tablets (30 mg total) by mouth 3 (three) times daily.  180 tablet  1  . potassium chloride 20 MEQ/15ML (10%) SOLN Take 20 mEq by mouth daily.      . Valproic Acid (DEPAKENE) 250 MG/5ML SYRP syrup Take 500 mg by mouth daily.         Assessment: 66 yo man transferred from [facility] with multiple rib fractures now found to have DVT.  MD is dosing lovenox and Coumadin started 8/7.  Today is day #2/5 VTE overlap.  No bleeding or complications noted.  Goal of Therapy:  INR 2-3 Monitor platelets by anticoagulation protocol: Yes   Plan:  Will repeat Coumadin 7.5 mg today. Check daily PT/INR. F/u education once mental status improves. Monitor for S&S bleeding.  Gardner Candle 08/15/2011,9:44 AM

## 2011-08-15 NOTE — Consult Note (Signed)
Pt in some respiratory distress and heading down for a CT. Will hold on formal consult until the AM.   Ivory Broad, MD

## 2011-08-15 NOTE — Progress Notes (Signed)
Pt discussed in hospital LOS meeting this am.  

## 2011-08-16 ENCOUNTER — Inpatient Hospital Stay (HOSPITAL_COMMUNITY): Payer: Medicare Other

## 2011-08-16 DIAGNOSIS — R55 Syncope and collapse: Secondary | ICD-10-CM

## 2011-08-16 LAB — CULTURE, BLOOD (ROUTINE X 2): Culture: NO GROWTH

## 2011-08-16 LAB — PROTIME-INR
INR: 2.69 — ABNORMAL HIGH (ref 0.00–1.49)
Prothrombin Time: 26.2 seconds — ABNORMAL HIGH (ref 11.6–15.2)
Prothrombin Time: 29 s — ABNORMAL HIGH (ref 11.6–15.2)

## 2011-08-16 LAB — BASIC METABOLIC PANEL
BUN: 13 mg/dL (ref 6–23)
Chloride: 105 mEq/L (ref 96–112)
GFR calc Af Amer: >90 mL/min (ref >90)
GFR calc non Af Amer: >90 mL/min (ref >90)
Potassium: 3.9 mEq/L (ref 3.5–5.1)
Sodium: 142 mEq/L (ref 135–145)

## 2011-08-16 MED ORDER — GUAIFENESIN ER 600 MG PO TB12
1200.0000 mg | ORAL_TABLET | Freq: Two times a day (BID) | ORAL | Status: DC
  Administered 2011-08-16 – 2011-08-20 (×9): 1200 mg via ORAL
  Filled 2011-08-16 (×10): qty 2

## 2011-08-16 MED ORDER — PNEUMOCOCCAL VAC POLYVALENT 25 MCG/0.5ML IJ INJ
0.5000 mL | INJECTION | INTRAMUSCULAR | Status: AC
  Administered 2011-08-17: 0.5 mL via INTRAMUSCULAR
  Filled 2011-08-16: qty 0.5

## 2011-08-16 NOTE — Progress Notes (Signed)
Rehab Admissions Coordinator Note:  Patient was screened by Trish Mage for appropriateness for an Inpatient Acute Rehab Consult.  At this time, we are recommending Inpatient Rehab consult.  Dr. Riley Kill to do a formal inpatient rehab consult today, 08/16/11 and then we will follow-up.  Trish Mage 08/16/2011, 10:36 AM  I can be reached at [phone]

## 2011-08-16 NOTE — Progress Notes (Signed)
Subjective:  Pt fully awake. He has no complaints. The nurses have reported so episodes of confusion but pt is fully A*O x 3 at present.  Interval History: Consult to cardiology pending. Objective: Filed Vitals:   08/16/11 1129 08/16/11 1200 08/16/11 1518 08/16/11 1600  BP:  124/63  130/76  Pulse:  82  90  Temp:  98.4 F (36.9 C)    TempSrc:  Oral    Resp:  19  23  Height:      Weight:      SpO2: 96% 94% 95% 92%   Weight change: 0 kg (0 lb)  Intake/Output Summary (Last 24 hours) at 08/16/11 2022 Last data filed at 08/16/11 1800  Gross per 24 hour  Intake   1924 ml  Output   2550 ml  Net   -626 ml   General: Alert, awake, oriented x3, in no acute distress.  HEENT: [place]/AT PEERL, EOMI  Heart: Regular rate and rhythm, without murmurs, rubs, gallops.  Lungs: Clear to auscultation, no wheezing or rhonchi noted.  Abdomen: Soft, nontender, nondistended, positive bowel sounds, no masses no hepatosplenomegaly noted.  Neuro: No focal neurological deficits noted. However he does exhibit generalized weakness  Psychiatric: Patient alert and oriented x3    Lab Results:  Basename 08/16/11 0500 08/15/11 0512  NA 142 142  K 3.9 3.1*  CL 105 101  CO2 29 31  GLUCOSE 93 97  BUN 13 14  CREATININE 0.63 0.70  CALCIUM 8.8 8.6  MG -- --  PHOS -- --   No results found for this basename: AST:2,ALT:2,ALKPHOS:2,BILITOT:2,PROT:2,ALBUMIN:2 in the last 72 hours No results found for this basename: LIPASE:2,AMYLASE:2 in the last 72 hours  Basename 08/15/11 0512 08/14/11 0400  WBC 10.3 11.6*  NEUTROABS 5.9 --  HGB 14.1 14.1  HCT 40.6 41.3  MCV 92.3 93.0  PLT 252 223   No results found for this basename: CKTOTAL:3,CKMB:3,CKMBINDEX:3,TROPONINI:3 in the last 72 hours No components found with this basename: POCBNP:3 No results found for this basename: DDIMER:2 in the last 72 hours No results found for this basename: HGBA1C:2 in the last 72 hours No results found for this basename:  CHOL:2,HDL:2,LDLCALC:2,TRIG:2,CHOLHDL:2,LDLDIRECT:2 in the last 72 hours No results found for this basename: TSH,T4TOTAL,FREET3,T3FREE,THYROIDAB in the last 72 hours No results found for this basename: VITAMINB12:2,FOLATE:2,FERRITIN:2,TIBC:2,IRON:2,RETICCTPCT:2 in the last 72 hours  Micro Results: Recent Results (from the past 240 hour(s))  MRSA PCR SCREENING     Status: Abnormal   Collection Time   08/07/11 10:31 PM      Component Value Range Status Comment   MRSA by PCR POSITIVE (*) NEGATIVE Final   CULTURE, RESPIRATORY     Status: Normal   Collection Time   08/07/11 11:47 PM      Component Value Range Status Comment   Specimen Description TRACHEAL ASPIRATE   Final    Special Requests NONE   Final    Gram Stain     Final    Value: FEW WBC PRESENT,BOTH PMN AND MONONUCLEAR     NO SQUAMOUS EPITHELIAL CELLS SEEN     NO ORGANISMS SEEN   Culture     Final    Value: MODERATE MORAXELLA CATARRHALIS(BRANHAMELLA)     Note: BETA LACTAMASE POSITIVE   Report Status 08/10/2011 FINAL   Final   CULTURE, RESPIRATORY     Status: Normal   Collection Time   08/09/11  1:00 PM      Component Value Range Status Comment   Specimen Description TRACHEAL ASPIRATE  Final    Special Requests NONE   Final    Gram Stain     Final    Value: FEW WBC PRESENT,BOTH PMN AND MONONUCLEAR     NO SQUAMOUS EPITHELIAL CELLS SEEN     NO ORGANISMS SEEN   Culture NO GROWTH 2 DAYS   Final    Report Status 08/11/2011 FINAL   Final   CULTURE, BLOOD (ROUTINE X 2)     Status: Normal   Collection Time   08/10/11 10:00 AM      Component Value Range Status Comment   Specimen Description BLOOD LEFT ARM   Final    Special Requests BOTTLES DRAWN AEROBIC AND ANAEROBIC 10CC   Final    Culture  Setup Time 08/10/2011 17:09   Final    Culture NO GROWTH 5 DAYS   Final    Report Status 08/16/2011 FINAL   Final   CULTURE, BLOOD (ROUTINE X 2)     Status: Normal   Collection Time   08/10/11 10:20 AM      Component Value Range Status  Comment   Specimen Description BLOOD LEFT ARM   Final    Special Requests BOTTLES DRAWN AEROBIC AND ANAEROBIC 10CC   Final    Culture  Setup Time 08/10/2011 17:09   Final    Culture NO GROWTH 5 DAYS   Final    Report Status 08/16/2011 FINAL   Final   URINE CULTURE     Status: Normal   Collection Time   08/10/11  5:31 PM      Component Value Range Status Comment   Specimen Description URINE, CATHETERIZED   Final    Special Requests NONE   Final    Culture  Setup Time 08/11/2011 02:36   Final    Colony Count NO GROWTH   Final    Culture NO GROWTH   Final    Report Status 08/12/2011 FINAL   Final     Studies/Results: Dg Ribs Unilateral W/chest Left  08/07/2011  *RADIOLOGY REPORT*  Clinical Data: Fall today.  Left rib pain.  Flank pain.  LEFT RIBS AND CHEST - 3+ VIEW  Comparison: Portable chest 02/09/2011.  Findings: Heart is mildly enlarged.  A prominent left pleural effusion is present.  Multiple left-sided rib fractures involve the 7th through 11th ribs laterally and anteriorly.  There is no definite pneumothorax.  Limited imaging of the upper abdomen demonstrates contrast within the left renal collecting system secondary to the patient's CT scan.  IMPRESSION:  1.  Left-sided 7th through 11th rib fractures. 2.  Left pleural effusion and airspace disease likely reflects a hemothorax and atelectasis or pulmonary contusion.  Original Report Authenticated By: Jamesetta Orleans. MATTERN, M.D.   Ct Head Wo Contrast  08/07/2011  *RADIOLOGY REPORT*  Clinical Data: Fall with history of stroke affecting left arm.  CT HEAD WITHOUT CONTRAST  Technique:  Contiguous axial images were obtained from the base of the skull through the vertex without contrast.  Comparison: 02/09/2011  Findings: Bone windows demonstrate no significant soft tissue swelling.  Mucous retention cyst or polyp in the right maxillary sinus.  There are also more cephalad secretions in this area. Persistent ethmoid air cell mucosal thickening.   Clear mastoid air cells.  Soft tissue windows demonstrate redemonstration of encephalomalacia involving the right parietal and occipital lobes.  Resultant ex vacuo dilatation of the right lateral ventricle.  Mild low density in the periventricular white matter likely related to small vessel disease.Mild extension of  encephalomalacia in the right temporal lobe which is unchanged.  No  mass lesion, hemorrhage, hydrocephalus, acute infarct, intra- axial, or extra-axial fluid collection.  IMPRESSION:  1.  No acute or post-traumatic deformity identified. 2.  Remote right-sided hemispheric infarct. 3.  Mild small vessel ischemic change. 4.  Sinus disease.  Original Report Authenticated By: Consuello Bossier, M.D.   Ct Chest W Contrast  08/07/2011  *RADIOLOGY REPORT*  Clinical Data:  The patient fell at home.  Neck pain.  Left-sided rib fractures.  Pneumothorax.  New left chest tube.  CT CHEST WITH CONTRAST  Technique:  Multidetector CT imaging of the chest was performed following the standard protocol during bolus administration of intravenous contrast.  Contrast: OMNIPAQUE IOHEXOL 300 MG/ML  SOLN  Comparison: .Multiple prior studies from 08/07/2011  Findings: The patient has a left-sided chest tube, tip in the region of the left lung apex.  Small left pneumothorax persists. There is consolidation of collapsed lung on the left.  No evidence for pleural effusion.  Numerous left-sided rib fractures are present, involving at least ribs 07/09/2008.  The 11th rib is not completely imaged.  The right lung is well-aerated.  The heart size is normal.  No evidence for mediastinal injury. The visualized portion of the thyroid gland has a normal appearance.  Images of the upper abdomen are unremarkable.  IMPRESSION:  1.  Multiple left-sided rib fractures. 2.  Small left residual pneumothorax. 3.  Consolidation and atelectasis of the left lung.  Original Report Authenticated By: Patterson Hammersmith, M.D.   Ct Angio Chest Pe  W/cm &/or Wo Cm  08/15/2011  *RADIOLOGY REPORT*  Clinical Data: Shortness of breath/hypoxia  CT ANGIOGRAPHY CHEST  Technique:  Multidetector CT imaging of the chest using the standard protocol during bolus administration of intravenous contrast. Multiplanar reconstructed images including MIPs were obtained and reviewed to evaluate the vascular anatomy.  Contrast: 60mL OMNIPAQUE IOHEXOL 350 MG/ML SOLN  Comparison: Chest radiograph dated 08/13/2011.  CT chest dated 08/07/2011.  Findings: No evidence of pulmonary embolism.  Multifocal patchy/perihilar opacities, suspicious for interstitial edema.  Small left and trace right pleural effusions.  Associated lower lobe atelectasis.  No pneumothorax.  Visualized thyroid is unremarkable.  Cardiomegaly.  No pericardial effusion.  Coronary atherosclerosis. Mild atherosclerotic calcifications of the aortic arch.  Small mediastinal/hilar lymph nodes measuring up to 9 mm short axis which do not meet pathologic CT size criteria.  No suspicious axillary lymphadenopathy.  Right arm PICC.  Visualized upper abdomen unremarkable.  Left 7th and 8th rib fractures.  Mild degenerative changes of the thoracic spine.  IMPRESSION: No evidence of pulmonary embolism.  Cardiomegaly with suspected mild interstitial edema.  Small left and trace right pleural effusions, with associated lower lobe atelectasis.  Left 7th and 8th rib fractures.  No pneumothorax.  Original Report Authenticated By: Charline Bills, M.D.   Ct Cervical Spine Wo Contrast  08/07/2011  *RADIOLOGY REPORT*  Clinical Data:  Trauma.  The patient fell at home.  CT CERVICAL SPINE WITHOUT CONTRAST  Technique:  Multidetector CT imaging of the cervical spine was performed without intravenous contrast.  Multiplanar CT image reconstructions were also generated.  Comparison:  Multiple prior CT exams of the head, most recent 08/07/2011  Findings:  There is moderate degenerative change throughout the cervical spine.  No evidence for  acute fracture or subluxation. Left-sided chest tube is imaged at the left lung apex.  Left pneumothorax is present.  IMPRESSION:  1.  Degenerative changes. 2. No evidence  for acute osseous abnormality. 3.  Left pneumothorax and chest tube.  Original Report Authenticated By: Patterson Hammersmith, M.D.   Ct Abdomen Pelvis W Contrast  08/07/2011  *RADIOLOGY REPORT*  Clinical Data: status post fall.  CT ABDOMEN AND PELVIS WITH CONTRAST  Technique:  Multidetector CT imaging of the abdomen and pelvis was performed following the standard protocol during bolus administration of intravenous contrast.  Contrast: OMNIPAQUE IOHEXOL 300 MG/ML  SOLN  Comparison: 02/18/2010  Findings: Atelectasis and consolidation involving the left lung with associated left lung volume loss is identified.  This is a new finding since 02/08/2010.  No pericardial or pleural effusion identified.  No focal liver abnormality.  The gallbladder is normal.  No biliary dilatation.  The pancreas is unremarkable.  Similar appearance of the spleen with deformity of the anterior portion of the splenic parenchyma.  Query prior infarct or trauma.  Both adrenal glands are normal.  Bilateral renal cysts are noted. There is a nonobstructing calculi within the upper pole the right kidney measuring 2-3 mm, image 41.  Unchanged from previous exam. The urinary bladder appears normal.  There is no adenopathy within the upper abdomen.  No pelvic or inguinal adenopathy identified.  There is no free fluid or fluid collections within the abdomen or pelvis.  Review of the visualized bony structures is significant for acute left 8th through 12th rib fracture deformities.  No right rib fracture deformities.  There is a curvature of the thoracic and lumbar spine which is convex to the right.  Mild multilevel degenerative disc disease is noted.  The vertebral body heights are well maintained.  IMPRESSION:  1.  Multiple acute left sided rib fractures. 2.  Atelectasis and  consolidation involving the left midlung and left base.  Cannot rule out pulmonary contusion, pneumonia or aspiration.  There is associated volume loss of the left hemithorax.  Suggest further evaluation with CT of the chest 3.  No acute findings within the abdomen or pelvis.  Original Report Authenticated By: Rosealee Albee, M.D.   Dg Chest Port 1 View  08/16/2011  *RADIOLOGY REPORT*  Clinical Data: Respiratory failure.  PORTABLE CHEST - 1 VIEW  Comparison: 08/15/2011  Findings: Mild cardiomegaly, stable.  Right PICC line is in stable position.  Patchy bilateral airspace opacities are again noted, not significantly changed. Possible small left effusion.  No acute bony abnormality.  IMPRESSION: No significant change.  Original Report Authenticated By: Cyndie Chime, M.D.   Dg Chest Port 1 View  08/15/2011  *RADIOLOGY REPORT*  Clinical Data: Hypoxia, shortness of breath, weakness.  PORTABLE CHEST - 1 VIEW  Comparison: 08/13/2011  Findings: Bilateral airspace opacities are again noted, presumably pulmonary edema although infection cannot be excluded.  Slightly more confluent opacity in the right mid lung, new since prior study.  Right PICC line is unchanged.  Mild cardiomegaly and small left effusion.  IMPRESSION: Patchy bilateral airspace opacities, edema versus infection.  More confluent opacity in the right mid lung.  Cardiomegaly.  Small left effusion.  Original Report Authenticated By: Cyndie Chime, M.D.   Dg Chest Port 1 View  08/13/2011  *RADIOLOGY REPORT*  Clinical Data: Short of breath  PORTABLE CHEST - 1 VIEW  Comparison: Chest radiograph 08/11/2011  Findings: Interval extubation.  Right PICC line remains unchanged. Stable enlarged heart silhouette.  There are is ani interstitial edema pattern not changed from prior.  Basilar atelectasis may be slightly increased.  IMPRESSION:  1.  Slight increase in basilar atelectasis  following extubation. 2.  Stable interstitial edema.  Original Report  Authenticated By: Genevive Bi, M.D.   Dg Chest Port 1 View  08/11/2011  *RADIOLOGY REPORT*  Clinical Data: Respiratory failure  PORTABLE CHEST - 1 VIEW  Comparison:   the previous day's study  Findings: Endotracheal tube, nasogastric tube, and right arm PICC are stable in position.  Heart size upper limits normal for technique.  Relatively low lung volumes.  Mild interstitial airspace opacities throughout both lungs not convincingly changed from previous exam given differences in degree of inspiration.  No effusion evident.  IMPRESSION:  1.  Slightly improved aeration.  Otherwise no convincing change in bilateral infiltrates/edema. 2. Support hardware stable in position.  Original Report Authenticated By: Osa Craver, M.D.   Dg Chest Port 1 View  08/10/2011  *RADIOLOGY REPORT*  Clinical Data: Respiratory failure.  Multiple rib fractures.  PORTABLE CHEST - 1 VIEW  Comparison: 08/09/2011.  Findings: Endotracheal tube, nasogastric tube and right upper extremity PICC appear unchanged.  Today's exam is under penetrated.  Interval removal of the left thoracostomy tube.  No left-sided pneumothorax is identified.  Left lung patchy airspace density is present in conjunction with pleural reaction from prior chest tube. Cardiopericardial silhouette is enlarged.  Multiple rib fractures noted. Left lower rib fractures again noted.  IMPRESSION:  1.  Interval removal of left thoracostomy tube.  No pneumothorax. 2.  Other support apparatus stable. 3.  Unchanged pulmonary aeration.  Original Report Authenticated By: Andreas Newport, M.D.   Dg Chest Port 1 View  08/09/2011  *RADIOLOGY REPORT*  Clinical Data: Pneumonia and rib fracture.  PORTABLE CHEST - 1 VIEW  Comparison: 08/08/2011  Findings: The patient is rotated to the right.  The endotracheal tube and left chest tube are stable.  There has been placement of a right subclavian CVP and the tip is located overlying the superior vena cava in the region of the  superior cavoatrial junction.  Cardiomegaly is felt to be stable given the degree of rotation present.  And prominence of the superior mediastinum is felt to be due to projection.  Pulmonary vascular congestion without overt congestive failure is seen.  Increased density at the left base is again noted but has improved slightly since the previous exam. There is no definite residual pneumothorax or pleural fluid seen.  No new areas of atelectasis or infiltrate are noted with minimal subsegmental atelectasis at the right lung base.  Displaced fracture of the lateral aspect of the left ninth rib is again noted  IMPRESSION: Lines and tubes as above.  Improving left lower lobe infiltrate with no residual pneumothorax or pleural fluid is seen on the left.  Original Report Authenticated By: Bertha Stakes, M.D.   Dg Chest Portable 1 View  08/08/2011  *RADIOLOGY REPORT*  Clinical Data: Chest tube insertion.  PORTABLE CHEST - 1 VIEW  Comparison: Chest x-ray 08/07/2011 at 08:01 p.m.  Findings: A left-sided chest tube has been placed.  Pleural air is evident at the left lung apex.  Chest wall emphysema is evident.  A left pleural effusion is not significantly changed.  Mild pulmonary vascular congestion on the right is stable.  Left-sided rib fractures are again noted.  IMPRESSION:  1.  Interval placement of a left-sided chest tube with some residual pleural air and pneumothorax. 2.  Stable left pleural effusion. 3.  Chest wall emphysema.  Of note, the study was not evident on the [facility] PACS until after 3 o'clock p.m. 08/08/2011  Original  Report Authenticated By: Jamesetta Orleans. MATTERN, M.D.   Dg Chest Port 1 View  08/08/2011  *RADIOLOGY REPORT*  Clinical Data: Chest tube.  Rib fractures.  PORTABLE CHEST - 1 VIEW  Comparison: 1 day prior  Findings: Endotracheal tube unchanged with tip 4.5 cm above carina. Left-sided chest tube unchanged in position.  The previously questioned tiny left apical pneumothorax is not  appreciated. Decreased tiny amount of subcutaneous emphysema adjacent the left- sided chest tube.  Cardiomegaly accentuated by AP portable technique.  Small left pleural effusion.  Left-sided rib fractures.  Right lung clear. Slight worsening left base air space disease.  IMPRESSION:  1.  Left chest tube remains in place without evidence of residual pneumothorax. 2.  Slight worsening left-sided aeration with increased atelectasis and similar small volume left pleural effusion.  Original Report Authenticated By: Consuello Bossier, M.D.   Dg Chest Port 1 View  08/08/2011  *RADIOLOGY REPORT*  Clinical Data: Post intubation.  PORTABLE CHEST - 1 VIEW  Comparison: 08/07/2011  Findings: Endotracheal tube tip positioned 1.8 cm proximal to the carina.  Prominent cardiomediastinal contours. Left-sided chest tube in place.  Retrocardiac consolidation.  Tiny left apical pneumothorax is suggested.  There are multiple left lower lateral rib fractures.  Right lung is predominately clear. Small amount of subcutaneous gas along the left hemithorax.  IMPRESSION: Endotracheal tube tip 1.8 cm proximal to the carina.  Retrocardiac opacity may reflect atelectasis or aspiration.  Left-sided chest tube with suggestion of tiny apical pneumothorax.  Multiple left lateral rib fractures.  Original Report Authenticated By: Waneta Martins, M.D.   Dg Chest Portable 1 View  08/07/2011  *RADIOLOGY REPORT*  Clinical Data: Fall.  Short of breath.  Pneumonia.  PORTABLE CHEST - 1 VIEW  Comparison: 08/07/2011.  Findings: Worsening left-sided pulmonary aeration suggesting increasing pleural fluid and in the setting of trauma increasing left hemothorax.  Increasing atelectasis in the right lung.  There is probably also superimposed interstitial pulmonary edema.  The cardiopericardial silhouette is partially obscured.  IMPRESSION: Increasing opacification of the left hemithorax, compatible with increasing hemothorax in the setting of trauma.  Only a  small portion of the left upper lobe remains aerated.  Original Report Authenticated By: Andreas Newport, M.D.    Medications: I have reviewed the patient's current medications. Scheduled Meds:   . ipratropium  0.5 mg Nebulization Q4H   And  . albuterol  2.5 mg Nebulization Q4H  . enoxaparin (LOVENOX) injection  105 mg Subcutaneous Q12H  . feeding supplement  1 Container Oral TID BM  . furosemide      . furosemide  20 mg Intravenous Q12H  . guaiFENesin  1,200 mg Oral BID  . levETIRAcetam  1,000 mg Oral TID  . pantoprazole  40 mg Oral Q1200  . pneumococcal 23 valent vaccine  0.5 mL Intramuscular Tomorrow-1000  . potassium chloride  40 mEq Oral BID  . sodium chloride  10-40 mL Intracatheter Q12H  . Valproic Acid  500 mg Oral Daily  . vitamin E  400 Units Oral TID  . Warfarin - Pharmacist Dosing Inpatient   Does not apply q1800   Continuous Infusions:   . sodium chloride 0.9 % 1,000 mL with potassium chloride 40 mEq infusion 20 mL/hr at 08/16/11 1800   PRN Meds:.acetylcysteine, morphine injection, ondansetron (ZOFRAN) IV, ondansetron, sodium chloride, DISCONTD: HYDROcodone-acetaminophen Assessment/Plan: Patient Active Hospital Problem List: SYNCOPE (05/01/2009)   Assessment: The patient's story of events of that day are changing. I have been unable to reach  family to get additional information. However this could be micturation vs. Cardiogenic syncope.Cardiology consult pending.   Atrial fibrillation (08/13/2011)   Assessment: HR controlled. Started on Anticoagulation      LOS: 9 days   MATTHEWS,MICHELLE A.

## 2011-08-16 NOTE — Progress Notes (Signed)
Patient ID: Jeffrey Sweeney, male   DOB: [birth year], 66 y.o.   MRN: [id number]   LOS: 9 days   Subjective: No c/o. Thinks coughing is a little better. RN reports some agitation, pulling at lines OVN.  Objective: Vital signs in last 24 hours: Temp:  [98.1 F (36.7 C)-98.7 F (37.1 C)] 98.2 F (36.8 C) (08/09 0800) Pulse Rate:  [77-86] 77  (08/09 0800) Resp:  [21-29] 29  (08/09 0800) BP: (113-143)/(61-83) 143/83 mmHg (08/09 0800) SpO2:  [90 %-97 %] 97 % (08/09 0811) on 4 liters FiO2 (%):  [40 %-100 %] 40 % (08/08 1704) Weight:  [105.6 kg (232 lb 12.9 oz)] 105.6 kg (232 lb 12.9 oz) (08/09 0500)    I&O: -65ml/24h Total: +2268/admission  IS:   Lab Results:  BMET  Basename 08/16/11 0500 08/15/11 0512  NA 142 142  K 3.9 3.1*  CL 105 101  CO2 29 31  GLUCOSE 93 97  BUN 13 14  CREATININE 0.63 0.70  CALCIUM 8.8 8.6   Lab Results  Component Value Date   INR 2.36* 08/16/2011   INR 1.15 08/15/2011   INR 0.94 08/07/2011    CXR: Pending   General appearance: alert and no distress Resp: clear to auscultation bilaterally and congested cough Cardio: regular rate and rhythm GI: normal findings: bowel sounds normal and soft, non-tender Neuro: A&Ox3   Assessment/Plan: Fall  Multiple left rib fxs -- Pain control and pulmonary toilet. Much better today Right calf DVT -- Lovenox + coumadin  Syncope -- IM working it up, suspect cardiogenic. Will ask for consult. Multiple medical problems -- Home meds  Hypokalemia -- Improved FEN -- Will d/c foley with MS improvement. Add guaifenisen.  Dispo -- CIR vs home. Family has declined SNF placement. Continue SDU with agitation, marginal respiratory status.    Freeman Caldron, PA-C Pager: [phone] General Trauma PA Pager: [phone]   08/16/2011

## 2011-08-16 NOTE — Clinical Social Work Note (Signed)
Clinical Social Worker continuing to follow patient for emotional support and discharge planning needs.  Per chart, patient and patient family awaiting inpatient rehab consult to determine patient eligibility.  Patient family remains clear that if patient is unable to attend inpatient rehab for any reason, they plan to take him home with home health and 24 hour support.    Clinical Social Worker will sign off for now as social work intervention is no longer needed. Please consult [place] again if new need arises.  Macario Golds, [place] [phone]

## 2011-08-16 NOTE — Progress Notes (Signed)
CXR  today does not look much different.  Marginal but doing okay.  This patient has been seen and I agree with the findings and treatment plan.  Marta Lamas. Gae Bon, MD, FACS [phone] (pager) [phone] (direct pager) Trauma Surgeon

## 2011-08-16 NOTE — Progress Notes (Addendum)
ANTICOAGULATION CONSULT NOTE - Initial Consult  Pharmacy Consult for coumadin Indication: DVT  Allergies  Allergen Reactions  . Meperidine Hcl     Patient Measurements: Height: 5\' 8"  (172.7 cm) Weight: 232 lb 12.9 oz (105.6 kg) IBW/kg (Calculated) : 68.4    Vital Signs: Temp: 98.2 F (36.8 C) (08/09 0800) Temp src: Oral (08/09 0800) BP: 143/83 mmHg (08/09 0800) Pulse Rate: 77  (08/09 0800)  Labs:  Basename 08/16/11 0500 08/15/11 0512 08/14/11 0400  HGB -- 14.1 14.1  HCT -- 40.6 41.3  PLT -- 252 223  APTT -- -- --  LABPROT 26.2* 14.9 --  INR 2.36* 1.15 --  HEPARINUNFRC -- -- --  CREATININE 0.63 0.70 0.69  CKTOTAL -- -- --  CKMB -- -- --  TROPONINI -- -- --    Estimated Creatinine Clearance: 108.5 ml/min (by C-G formula based on Cr of 0.63).   Medical History: Past Medical History  Diagnosis Date  . Heart disease   . CAD (coronary artery disease)   . Stroke     left arm weakness  . Seizures   . Depression     Medications:  Prescriptions prior to admission  Medication Sig Dispense Refill  . albuterol (PROAIR HFA) 108 (90 BASE) MCG/ACT inhaler Inhale 2 puffs into the lungs 4 (four) times daily.      Marland Kitchen ALPRAZolam (XANAX) 1 MG tablet Take 1 mg by mouth 3 (three) times daily as needed. For anxiety      . amLODipine (NORVASC) 5 MG tablet Take 5 mg by mouth daily.      Marland Kitchen atorvastatin (LIPITOR) 40 MG tablet Take 40 mg by mouth at bedtime.       . Eszopiclone (ESZOPICLONE) 3 MG TABS Take 3 mg by mouth at bedtime. Take immediately before bedtime      . HYDROcodone-acetaminophen (NORCO) 7.5-325 MG per tablet Take 1 tablet by mouth 4 (four) times daily.      Marland Kitchen levETIRAcetam (KEPPRA) 1000 MG tablet Take 1,000 mg by mouth 3 (three) times daily.      . nitroGLYCERIN (NITROSTAT) 0.4 MG SL tablet Place 0.4 mg under the tongue every 5 (five) minutes as needed. Chest pain      . pantoprazole (PROTONIX) 40 MG tablet Take 40 mg by mouth daily.        . phenelzine (NARDIL) 15  MG tablet Take 2 tablets (30 mg total) by mouth 3 (three) times daily.  180 tablet  1  . potassium chloride 20 MEQ/15ML (10%) SOLN Take 20 mEq by mouth daily.      . Valproic Acid (DEPAKENE) 250 MG/5ML SYRP syrup Take 500 mg by mouth daily.         Assessment: 66 yo man transferred from [facility] with multiple rib fractures now found to have DVT.  MD is dosing lovenox and Coumadin started 8/7.  Today is day #3/5 VTE overlap.  INR increased to 2.36 from 1.15 yesterday, I wonder if it is lab error, or may need much lower daily dose. Pt. Only received 2 doses of 7.5mg . Renal function stable, no new cbc, no bleeding or complications noted.  Goal of Therapy:  INR 2-3 Monitor platelets by anticoagulation protocol: Yes   Plan:  - Recheck INR, and will give lower dose if it is truly > 2 - F/u education once mental status improves. - Monitor for S&S bleeding.  Bayard Hugger, PharmD, BCPS  Clinical Pharmacist  Pager: [phone]  08/16/2011,8:47 AM  Addendum:  Recheck INR 2.69,  trending up from earlier this Am. Will hold coumadin today and f/u INR tomorrow morning, pt. Will likely need lower dose (3-5mg  daily)

## 2011-08-16 NOTE — Progress Notes (Signed)
Physical Therapy Treatment Patient Details Name: Jeffrey Sweeney MRN: [id number] DOB: Oct 07, 1945 Today's Date: 08/16/2011 Time: 1030-1053 PT Time Calculation (min): 23 min  PT Assessment / Plan / Recommendation Comments on Treatment Session  Still impulsive and not safety conscious.  Mobility better than expected with gait smooth and steady pushing the W/C.  Sats are still not staying at 90% and are dropping into the upper 80's on 3L [place].  However, pt is assymptomatic.    Follow Up Recommendations  Home health PT;Skilled nursing facility;Supervision/Assistance - 24 hour (depends on families ability to assist)    Barriers to Discharge        Equipment Recommendations  Defer to next venue    Recommendations for Other Services    Frequency Min 3X/week   Plan Discharge plan needs to be updated    Precautions / Restrictions Restrictions Weight Bearing Restrictions: No   Pertinent Vitals/Pain     Mobility  Bed Mobility Bed Mobility: Supine to Sit;Sitting - Scoot to Edge of Bed Supine to Sit: 5: Supervision Sitting - Scoot to Edge of Bed: 5: Supervision Details for Bed Mobility Assistance: vc's to address/reinforce safety Transfers Transfers: Sit to Stand;Stand to Sit Sit to Stand: 5: Supervision Stand to Sit: 5: Supervision Details for Transfer Assistance: impulsive, but did use hands appropriately, reinforced safety issues to him; stability assist Ambulation/Gait Ambulation/Gait Assistance: 5: Supervision Ambulation Distance (Feet): 450 Feet Assistive device: Other (Comment) (pushing w/c) Ambulation/Gait Assistance Details: generally steady, pushing the w/c safely Gait Pattern: Within Functional Limits Gait velocity: able to vary speed safely Stairs: No    Exercises     PT Diagnosis:    PT Problem List:   PT Treatment Interventions:     PT Goals Acute Rehab PT Goals Time For Goal Achievement: 08/14/11 Potential to Achieve Goals: Good PT Goal: Supine/Side to Sit -  Progress: Progressing toward goal PT Goal: Sit to Stand - Progress: Met PT Transfer Goal: Bed to Chair/Chair to Bed - Progress: Met PT Goal: Ambulate - Progress: Met  Visit Information  Last PT Received On: 08/16/11 Assistance Needed: +1    Subjective Data  Subjective: yeah my ribs hurt...they pinch me at night and  can't sleep Patient Stated Goal: get home   Cognition  Overall Cognitive Status: Impaired Area of Impairment: Attention;Safety/judgement;Memory Arousal/Alertness: Awake/alert Orientation Level: Disoriented to;Place;Time Behavior During Session: WFL for tasks performed Current Attention Level: Sustained Memory: Decreased recall of precautions Following Commands: Follows one step commands consistently Safety/Judgement: Decreased safety judgement for tasks assessed    Balance  Balance Balance Assessed: No  End of Session PT - End of Session Activity Tolerance: Patient tolerated treatment well Patient left: in chair;with call bell/phone within reach;with chair alarm set Nurse Communication: Mobility status   GP     Hoorain Kozakiewicz, Eliseo Gum 08/16/2011, 11:11 AM  08/16/2011  [place], PT [phone] (pager)

## 2011-08-16 NOTE — Consult Note (Addendum)
CARDIOLOGY CONSULT NOTE  Patient ID: Jeffrey Sweeney MRN: [id number] DOB/AGE: 1945/02/03 66 y.o.  Admit date: 08/07/2011 Referring Physician Abigail Miyamoto A  Primary Physician: Fredirick Maudlin, MD  Reason for Consultation please evaluate for syncope  HPI: Jeffrey Sweeney is a 66 year old Caucasian male with history of seizure disorder, OCD, COPD, hyperlipidemia, old stroke with left sided weakness,  hypertension who was admitted to [facility] on 08/07/2011 with syncope and refracture. Patient's sister who he called immediately after the fall is present at the bedside. Patient states that he went to the bathroom and after he finished urination, he felt imbalance to 2 steps backwards and then fell forwards on the commode and hit his chest. He remembers hitting the chest and falling to the ground and then he stood up by himself in the interim, patient's grandchildren heard him and try to help him. EMS was called and eventually he was admitted to the hospital. Patient did call his sister immediately after the fall and stated that he probably has fractured his ribs. He denies any seizure disorder, incontinence, not sure about complete loss of consciousness. He states that he is able to remember hitting the commode. Presently patient complains of left chest pain and mild shortness of breath but otherwise states that he's doing well.  Past Medical History  Diagnosis Date  . Heart disease   . CAD (coronary artery disease)   . Stroke     left arm weakness  . Seizures   . Depression      Past Surgical History  Procedure Date  . Ankle fracture surgery      Family History  Problem Relation Age of Onset  . Cancer Mother   . Cancer Father     Social History: History   Social History  . Marital Status: Widowed    Spouse Name: N/A    Number of Children: N/A  . Years of Education: N/A   Occupational History  . Not on file.   Social History Main Topics  . Smoking status: Current  Everyday Smoker -- 0.5 packs/day for 50 years    Types: Cigarettes  . Smokeless tobacco: Never Used  . Alcohol Use: No  . Drug Use: No  . Sexually Active: Not on file   Other Topics Concern  . Not on file   Social History Narrative  . No narrative on file     Prescriptions prior to admission  Medication Sig Dispense Refill  . albuterol (PROAIR HFA) 108 (90 BASE) MCG/ACT inhaler Inhale 2 puffs into the lungs 4 (four) times daily.      Marland Kitchen ALPRAZolam (XANAX) 1 MG tablet Take 1 mg by mouth 3 (three) times daily as needed. For anxiety      . amLODipine (NORVASC) 5 MG tablet Take 5 mg by mouth daily.      Marland Kitchen atorvastatin (LIPITOR) 40 MG tablet Take 40 mg by mouth at bedtime.       . Eszopiclone (ESZOPICLONE) 3 MG TABS Take 3 mg by mouth at bedtime. Take immediately before bedtime      . HYDROcodone-acetaminophen (NORCO) 7.5-325 MG per tablet Take 1 tablet by mouth 4 (four) times daily.      Marland Kitchen levETIRAcetam (KEPPRA) 1000 MG tablet Take 1,000 mg by mouth 3 (three) times daily.      . nitroGLYCERIN (NITROSTAT) 0.4 MG SL tablet Place 0.4 mg under the tongue every 5 (five) minutes as needed. Chest pain      . pantoprazole (PROTONIX) 40 MG  tablet Take 40 mg by mouth daily.        . phenelzine (NARDIL) 15 MG tablet Take 2 tablets (30 mg total) by mouth 3 (three) times daily.  180 tablet  1  . potassium chloride 20 MEQ/15ML (10%) SOLN Take 20 mEq by mouth daily.      . Valproic Acid (DEPAKENE) 250 MG/5ML SYRP syrup Take 500 mg by mouth daily.         ROS: General: no fevers/chills/night sweats Eyes: no blurry vision, diplopia, or amaurosis ENT: no sore throat or hearing loss Resp: Feels mildly dyspneic. Also has chronic dyspnea. CV: no edema or palpitations GI: no abdominal pain, nausea, vomiting, diarrhea, or constipation GU: no dysuria, frequency, or hematuria Skin: no rash Neuro: no headache, numbness, tingling, or weakness of extremities Musculoskeletal: Has left-sided chest pain worse  with deep inspiration.  Heme: no bleeding, DVT, or easy bruising Endo: no polydipsia or polyuria    Physical Exam: Blood pressure 124/63, pulse 82, temperature 98.4 F (36.9 C), temperature source Oral, resp. rate 19, height 5\' 8"  (1.727 m), weight 105.6 kg (232 lb 12.9 oz), SpO2 95.00%.  Pt is alert and oriented, WD, WN, in no distress. HEENT: normal Neck: JVP normal. Carotid upstrokes normal without bruits. No thyromegaly. Lungs: Scattered rhonchi heard and left-sided basilar crackles heard   CV: S1, S2 is normal no gallop or murmur appreciated.  Abd: soft, NT, +BS, no bruit, no hepatosplenomegaly  Back: no CVA tenderness Ext: Full range of movements. There is no edema. There is no calf tenderness.Femoral pulses 2+= without bruits DP/PT pulses intact  There is no femoral arterial bruit or carotid bruit. Skin: warm and dry without rash Neuro: CNII-XII intact Strength intact = bilaterally. Patient is alert oriented x3. Labs:   Lab Results  Component Value Date   WBC 10.3 08/15/2011   HGB 14.1 08/15/2011   HCT 40.6 08/15/2011   MCV 92.3 08/15/2011   PLT 252 08/15/2011    Lab 08/16/11 0500  NA 142  K 3.9  CL 105  CO2 29  BUN 13  CREATININE 0.63  CALCIUM 8.8  PROT --  BILITOT --  ALKPHOS --  ALT --  AST --  GLUCOSE 93   Lab Results  Component Value Date   CKTOTAL 50 11/30/2008   CKMB 0.9 11/30/2008   TROPONINI <0.30 08/07/2011    Lipid Panel     Component Value Date/Time   CHOL 126 07/31/2010 0652   TRIG 94 07/31/2010 0652   HDL 27* 07/31/2010 0652   CHOLHDL 4.7 07/31/2010 0652   VLDL 19 07/31/2010 0652   LDLCALC 80 07/31/2010 0652     Radiology:  No evidence of pulmonary embolism.  Cardiomegaly with suspected mild interstitial edema.  Small left and trace right pleural effusions, with associated lower lobe atelectasis.  Left 7th and 8th rib fractures.  No pneumothorax.  Original Report Authenticated By: Charline Bills, M.D.   EKG: 08/07/2011 EKG demonstrates normal  sinus rhythm, normal axis, LVH. No evidence of ischemia. Non-ST-T wave changes were evident. EKG on 08/13/2011 reveals nonspecific intraventricular conduction delay and presence of atrial fibrillation with rapid ventricular response.  Telemetry rhythm strips starting 08/14/2011 essentially revealed normal sinus rhythm.  ASSESSMENT AND PLAN:  1. Near syncope/syncope probably neurocardiogenic. Doubt that this is cardiac syncope. Patient clearly was able to remember that he took 2 steps backwards before falling on to the commode and he was able to stand up after the event and generally felt weak. Episode  happened just after micturition. 2. Cardiomegaly : Echocardiogram done 08/15/2011 revealing normal left systolic function with ejection fraction around 50%. Mild left ventricle dilatation was evident. No significant valvular abnormalities was evident. There was calcified mitral valve annulus.  Recommendation: Patient with prior history of stroke and atrial fibrillation that lasted probably the whole day on 08/13/2011 this is to be on anticoagulation long-term do to risk of cardioembolic stroke. He does need cardiac risk stratification, including a stress test as an outpatient basis. I do not think the syncope was related to dysrhythmias. Patient was in sinus rhythm on presentation to the hospital after the syncope. Underlying COPD could also be contributing to syncope. Presently he remains in sinus rhythm and is not had any recurrence of atrial fibrillation. I be happy to follow him up in the outpatient basis. Xarelto or Eliquis would also be a good choice in long term anticoagulation.   Addendum: Please note the family states that patient has been confused and is hallucinating stating that someone is on the roof, he was also seeing a person next to him. May need to watch him closely for ICU psychosis. If there is a risk of fall certainly can hold off on anti-coagulation Pamella Pert, MD 08/16/2011,  5:45 PM

## 2011-08-17 LAB — BASIC METABOLIC PANEL
Calcium: 8.9 mg/dL (ref 8.4–10.5)
GFR calc Af Amer: >90 mL/min (ref >90)
GFR calc non Af Amer: >90 mL/min (ref >90)
Glucose, Bld: 91 mg/dL (ref 70–99)
Potassium: 4 mEq/L (ref 3.5–5.1)
Sodium: 140 mEq/L (ref 135–145)

## 2011-08-17 LAB — CBC
MCH: 31.6 pg (ref 26.0–34.0)
MCHC: 33.6 g/dL (ref 30.0–36.0)
Platelets: 340 10*3/uL (ref 150–400)
RDW: 14.6 % (ref 11.5–15.5)

## 2011-08-17 LAB — PROTIME-INR
INR: 1.93 — ABNORMAL HIGH (ref 0.00–1.49)
Prothrombin Time: 22.4 seconds — ABNORMAL HIGH (ref 11.6–15.2)

## 2011-08-17 MED ORDER — SODIUM CHLORIDE 0.9 % IJ SOLN
INTRAMUSCULAR | Status: AC
  Administered 2011-08-17: 10 mL
  Filled 2011-08-17: qty 10

## 2011-08-17 MED ORDER — WARFARIN SODIUM 4 MG PO TABS
4.0000 mg | ORAL_TABLET | Freq: Once | ORAL | Status: AC
  Administered 2011-08-17: 4 mg via ORAL
  Filled 2011-08-17: qty 1

## 2011-08-17 NOTE — Progress Notes (Signed)
  Subjective: No complaints today, no events overnight with trying to get oob pull lines  Objective: Vital signs in last 24 hours: Temp:  [98 F (36.7 C)-98.7 F (37.1 C)] 98.6 F (37 C) (08/10 0736) Pulse Rate:  [31-90] 73  (08/10 0736) Resp:  [18-31] 22  (08/10 0400) BP: (122-145)/(61-76) 122/67 mmHg (08/10 0736) SpO2:  [91 %-100 %] 100 % (08/10 0823) Weight:  [225 lb 1.4 oz (102.1 kg)] 225 lb 1.4 oz (102.1 kg) (08/10 0400)    Intake/Output from previous day: 08/09 0701 - 08/10 0700 In: 1854 [P.O.:1320; I.V.:534] Out: 3350 [Urine:3350] Intake/Output this shift: Total I/O In: 20 [I.V.:20] Out: 225 [Urine:225]  General appearance: no distress Resp: clear to auscultation bilaterally Cardio: regular rate and rhythm GI: soft nontender  Lab Results:   Basename 08/17/11 0450 08/15/11 0512  WBC 10.9* 10.3  HGB 14.2 14.1  HCT 42.3 40.6  PLT 340 252   BMET  Basename 08/17/11 0450 08/16/11 0500  NA 140 142  K 4.0 3.9  CL 103 105  CO2 30 29  GLUCOSE 91 93  BUN 14 13  CREATININE 0.66 0.63  CALCIUM 8.9 8.8   PT/INR  Basename 08/17/11 0450 08/16/11 0930  LABPROT 22.4* 29.0*  INR 1.93* 2.69*    Assessment/Plan: Fall  Multiple left rib fxs -- Pain control and pulmonary toilet.  Right calf DVT -- Lovenox + coumadin  Syncope --medical eval Multiple medical problems -- Home meds  Dispo -- CIR vs home. Family has declined SNF placement. Can go to floor     LOS: 10 days    [facility] 08/17/2011

## 2011-08-17 NOTE — Progress Notes (Signed)
Subjective:  Pt fully awake. He has no complaints. The nurses have reported so episodes of confusion but pt is fully A*O x 3 at present.  Interval History: Consult to cardiology pending. Objective: Filed Vitals:   08/17/11 1146 08/17/11 1217 08/17/11 1534 08/17/11 1542  BP:  123/73  124/63  Pulse:  70  72  Temp:  98.7 F (37.1 C)  98.9 F (37.2 C)  TempSrc:  Oral  Oral  Resp:    18  Height:    5\' 8"  (1.727 m)  Weight:    101.2 kg (223 lb 1.7 oz)  SpO2: 100%  94% 94%   Weight change: -3.5 kg (-7 lb 11.5 oz)  Intake/Output Summary (Last 24 hours) at 08/17/11 1744 Last data filed at 08/17/11 1217  Gross per 24 hour  Intake    760 ml  Output   2775 ml  Net  -2015 ml   General: Alert, awake, oriented x3, in no acute distress.  HEENT: [place]/AT PEERL, EOMI  Heart: Regular rate and rhythm, without murmurs, rubs, gallops.  Lungs: Clear to auscultation, no wheezing or rhonchi noted.  Abdomen: Soft, nontender, nondistended, positive bowel sounds, no masses no hepatosplenomegaly noted.  Neuro: No focal neurological deficits noted. However he does exhibit generalized weakness  Psychiatric: Patient alert and oriented x3    Lab Results:  Basename 08/17/11 0450 08/16/11 0500  NA 140 142  K 4.0 3.9  CL 103 105  CO2 30 29  GLUCOSE 91 93  BUN 14 13  CREATININE 0.66 0.63  CALCIUM 8.9 8.8  MG -- --  PHOS -- --   No results found for this basename: AST:2,ALT:2,ALKPHOS:2,BILITOT:2,PROT:2,ALBUMIN:2 in the last 72 hours No results found for this basename: LIPASE:2,AMYLASE:2 in the last 72 hours  Basename 08/17/11 0450 08/15/11 0512  WBC 10.9* 10.3  NEUTROABS -- 5.9  HGB 14.2 14.1  HCT 42.3 40.6  MCV 94.2 92.3  PLT 340 252    Micro Results: Recent Results (from the past 240 hour(s))  MRSA PCR SCREENING     Status: Abnormal   Collection Time   08/07/11 10:31 PM      Component Value Range Status Comment   MRSA by PCR POSITIVE (*) NEGATIVE Final   CULTURE, RESPIRATORY     Status:  Normal   Collection Time   08/07/11 11:47 PM      Component Value Range Status Comment   Specimen Description TRACHEAL ASPIRATE   Final    Special Requests NONE   Final    Gram Stain     Final    Value: FEW WBC PRESENT,BOTH PMN AND MONONUCLEAR     NO SQUAMOUS EPITHELIAL CELLS SEEN     NO ORGANISMS SEEN   Culture     Final    Value: MODERATE MORAXELLA CATARRHALIS(BRANHAMELLA)     Note: BETA LACTAMASE POSITIVE   Report Status 08/10/2011 FINAL   Final   CULTURE, RESPIRATORY     Status: Normal   Collection Time   08/09/11  1:00 PM      Component Value Range Status Comment   Specimen Description TRACHEAL ASPIRATE   Final    Special Requests NONE   Final    Gram Stain     Final    Value: FEW WBC PRESENT,BOTH PMN AND MONONUCLEAR     NO SQUAMOUS EPITHELIAL CELLS SEEN     NO ORGANISMS SEEN   Culture NO GROWTH 2 DAYS   Final    Report Status 08/11/2011 FINAL  Final   CULTURE, BLOOD (ROUTINE X 2)     Status: Normal   Collection Time   08/10/11 10:00 AM      Component Value Range Status Comment   Specimen Description BLOOD LEFT ARM   Final    Special Requests BOTTLES DRAWN AEROBIC AND ANAEROBIC 10CC   Final    Culture  Setup Time 08/10/2011 17:09   Final    Culture NO GROWTH 5 DAYS   Final    Report Status 08/16/2011 FINAL   Final   CULTURE, BLOOD (ROUTINE X 2)     Status: Normal   Collection Time   08/10/11 10:20 AM      Component Value Range Status Comment   Specimen Description BLOOD LEFT ARM   Final    Special Requests BOTTLES DRAWN AEROBIC AND ANAEROBIC 10CC   Final    Culture  Setup Time 08/10/2011 17:09   Final    Culture NO GROWTH 5 DAYS   Final    Report Status 08/16/2011 FINAL   Final   URINE CULTURE     Status: Normal   Collection Time   08/10/11  5:31 PM      Component Value Range Status Comment   Specimen Description URINE, CATHETERIZED   Final    Special Requests NONE   Final    Culture  Setup Time 08/11/2011 02:36   Final    Colony Count NO GROWTH   Final    Culture NO  GROWTH   Final    Report Status 08/12/2011 FINAL   Final      Medications: I have reviewed the patient's current medications. Scheduled Meds:    . ipratropium  0.5 mg Nebulization Q4H   And  . albuterol  2.5 mg Nebulization Q4H  . enoxaparin (LOVENOX) injection  105 mg Subcutaneous Q12H  . feeding supplement  1 Container Oral TID BM  . furosemide  20 mg Intravenous Q12H  . guaiFENesin  1,200 mg Oral BID  . levETIRAcetam  1,000 mg Oral TID  . pantoprazole  40 mg Oral Q1200  . pneumococcal 23 valent vaccine  0.5 mL Intramuscular Tomorrow-1000  . potassium chloride  40 mEq Oral BID  . sodium chloride  10-40 mL Intracatheter Q12H  . sodium chloride      . Valproic Acid  500 mg Oral Daily  . warfarin  4 mg Oral ONCE-1800  . Warfarin - Pharmacist Dosing Inpatient   Does not apply q1800   Continuous Infusions:    . sodium chloride 0.9 % 1,000 mL with potassium chloride 40 mEq infusion 20 mL/hr at 08/17/11 0701   PRN Meds:.acetylcysteine, morphine injection, ondansetron (ZOFRAN) IV, ondansetron, sodium chloride Assessment/Plan: Patient Active Hospital Problem List: SYNCOPE (05/01/2009)   Assessment: Appreciate cardiology input. No further workup necessary from medicine perspective. We'll sign off   Atrial fibrillation (08/13/2011)   Assessment: HR controlled. Pharmacy titrating Coumadin.      LOS: 10 days   Davy Faught A.

## 2011-08-17 NOTE — Progress Notes (Signed)
ANTICOAGULATION CONSULT NOTE - Follow Up Consult  Pharmacy Consult for coumadin Indication: DVT  Allergies  Allergen Reactions  . Meperidine Hcl     Patient Measurements: Height: 5\' 8"  (172.7 cm) Weight: 225 lb 1.4 oz (102.1 kg) IBW/kg (Calculated) : 68.4    Vital Signs: Temp: 98.6 F (37 C) (08/10 0736) Temp src: Oral (08/10 0736) BP: 145/74 mmHg (08/10 0400) Pulse Rate: 31  (08/10 0400)  Labs:  Basename 08/17/11 0450 08/16/11 0930 08/16/11 0500 08/15/11 0512  HGB 14.2 -- -- 14.1  HCT 42.3 -- -- 40.6  PLT 340 -- -- 252  APTT -- -- -- --  LABPROT 22.4* 29.0* 26.2* --  INR 1.93* 2.69* 2.36* --  HEPARINUNFRC -- -- -- --  CREATININE 0.66 -- 0.63 0.70  CKTOTAL -- -- -- --  CKMB -- -- -- --  TROPONINI -- -- -- --    Estimated Creatinine Clearance: 106.6 ml/min (by C-G formula based on Cr of 0.66).   Medications:  Scheduled:    . ipratropium  0.5 mg Nebulization Q4H   And  . albuterol  2.5 mg Nebulization Q4H  . enoxaparin (LOVENOX) injection  105 mg Subcutaneous Q12H  . feeding supplement  1 Container Oral TID BM  . furosemide  20 mg Intravenous Q12H  . guaiFENesin  1,200 mg Oral BID  . levETIRAcetam  1,000 mg Oral TID  . pantoprazole  40 mg Oral Q1200  . pneumococcal 23 valent vaccine  0.5 mL Intramuscular Tomorrow-1000  . potassium chloride  40 mEq Oral BID  . sodium chloride  10-40 mL Intracatheter Q12H  . sodium chloride      . Valproic Acid  500 mg Oral Daily  . warfarin  4 mg Oral ONCE-1800  . Warfarin - Pharmacist Dosing Inpatient   Does not apply q1800    Assessment: Pt is a 66 y.o male admitted with multiple rib ractures found to have DVT. Rx consult to dose coumadin, MD dosing lovenox. Today 8/10 is day 4/5 of overlap.  INR  increased to 2.36 from 1.15 on 8/9 after 2 doses of 7.5 mg, possible DDI with valproic acid. Today's INR is 1.93. CBC stable no noted bleeding.  Goal of Therapy:  INR 2-3 Monitor platelets by anticoagulation protocol: Yes   Plan:  1.Coumadin 4 mg x 1 2. F/u daily INR closely 3. Monitor for s/sx of bleeding 4. Provide warfarin education when mental status improves  Bola A. Wandra Feinstein D Clinical Pharmacist Pager:[phone] Phone [phone] 08/17/2011 7:58 AM

## 2011-08-17 NOTE — Progress Notes (Signed)
Report called to [id number], RN. All meds up to current time. VSS.

## 2011-08-18 LAB — PROTIME-INR
INR: 1.91 — ABNORMAL HIGH (ref 0.00–1.49)
Prothrombin Time: 22.2 seconds — ABNORMAL HIGH (ref 11.6–15.2)

## 2011-08-18 LAB — GLUCOSE, CAPILLARY
Glucose-Capillary: 98 mg/dL (ref 70–99)
Glucose-Capillary: 109 mg/dL — ABNORMAL HIGH (ref 70–99)

## 2011-08-18 MED ORDER — ALBUTEROL SULFATE (5 MG/ML) 0.5% IN NEBU: 2.5000 mg | INHALATION_SOLUTION | RESPIRATORY_TRACT | Status: DC | PRN

## 2011-08-18 MED ORDER — ENOXAPARIN SODIUM 100 MG/ML ~~LOC~~ SOLN
1.0000 mg/kg | Freq: Two times a day (BID) | SUBCUTANEOUS | Status: DC
  Administered 2011-08-18 – 2011-08-20 (×4): 100 mg via SUBCUTANEOUS
  Filled 2011-08-18 (×7): qty 1

## 2011-08-18 MED ORDER — ALBUTEROL SULFATE (5 MG/ML) 0.5% IN NEBU
2.5000 mg | INHALATION_SOLUTION | Freq: Four times a day (QID) | RESPIRATORY_TRACT | Status: DC
  Administered 2011-08-18 – 2011-08-19 (×5): 2.5 mg via RESPIRATORY_TRACT
  Filled 2011-08-18 (×5): qty 0.5

## 2011-08-18 MED ORDER — IPRATROPIUM BROMIDE 0.02 % IN SOLN
0.5000 mg | Freq: Four times a day (QID) | RESPIRATORY_TRACT | Status: DC
  Administered 2011-08-18 – 2011-08-19 (×5): 0.5 mg via RESPIRATORY_TRACT
  Filled 2011-08-18 (×5): qty 2.5

## 2011-08-18 MED ORDER — WARFARIN SODIUM 4 MG PO TABS
4.0000 mg | ORAL_TABLET | Freq: Once | ORAL | Status: AC
  Administered 2011-08-18: 4 mg via ORAL
  Filled 2011-08-18: qty 1

## 2011-08-18 MED ORDER — ALBUTEROL SULFATE (5 MG/ML) 0.5% IN NEBU: 2.5000 mg | INHALATION_SOLUTION | Freq: Four times a day (QID) | RESPIRATORY_TRACT | Status: DC

## 2011-08-18 MED ORDER — IPRATROPIUM BROMIDE 0.02 % IN SOLN: 0.5000 mg | RESPIRATORY_TRACT | Status: DC

## 2011-08-18 NOTE — Progress Notes (Signed)
  Subjective: No complaints   Objective: Vital signs in last 24 hours: Temp:  [97.9 F (36.6 C)-98.9 F (37.2 C)] 98.1 F (36.7 C) (08/11 0455) Pulse Rate:  [62-72] 62  (08/11 0455) Resp:  [18-20] 20  (08/11 0455) BP: (103-124)/(55-73) 103/55 mmHg (08/11 0455) SpO2:  [91 %-96 %] 94 % (08/11 1050) Weight:  [222 lb 7.1 oz (100.9 kg)-223 lb 1.7 oz (101.2 kg)] 222 lb 7.1 oz (100.9 kg) (08/11 0338)    Intake/Output from previous day: 08/10 0701 - 08/11 0700 In: 791.7 [P.O.:300; I.V.:479.7; IV Piggyback:12] Out: 2025 [Urine:2025] Intake/Output this shift: Total I/O In: 360 [P.O.:360] Out: -   exam unchanged  Lab Results:   [facility] 08/17/11 0450  WBC 10.9*  HGB 14.2  HCT 42.3  PLT 340   BMET  Basename 08/17/11 0450 08/16/11 0500  NA 140 142  K 4.0 3.9  CL 103 105  CO2 30 29  GLUCOSE 91 93  BUN 14 13  CREATININE 0.66 0.63  CALCIUM 8.9 8.8   PT/INR  Basename 08/18/11 0546 08/17/11 0450  LABPROT 22.2* 22.4*  INR 1.91* 1.93*   ABG No results found for this basename: PHART:2,PCO2:2,PO2:2,HCO3:2 in the last 72 hours  Studies/Results: No results found.  Anti-infectives: Anti-infectives     Start     Dose/Rate Route Frequency Ordered Stop   08/13/11 2000   moxifloxacin (AVELOX) tablet 400 mg  Status:  Discontinued        400 mg Oral Daily 08/13/11 0844 08/14/11 0844   08/08/11 2300   vancomycin (VANCOCIN) IVPB 1000 mg/200 mL premix  Status:  Discontinued        1,000 mg 200 mL/hr over 60 Minutes Intravenous Every 12 hours 08/08/11 0954 08/11/11 1539   08/08/11 1100   vancomycin (VANCOCIN) 2,500 mg in sodium chloride 0.9 % 500 mL IVPB        2,500 mg 250 mL/hr over 120 Minutes Intravenous  Once 08/08/11 0954 08/08/11 1443   08/08/11 1000   moxifloxacin (AVELOX) IVPB 400 mg  Status:  Discontinued        400 mg 250 mL/hr over 60 Minutes Intravenous Every 24 hours 08/08/11 0847 08/13/11 0844          Assessment/Plan:  Fall  Multiple left rib fxs --  Pain control and pulmonary toilet.  Right calf DVT -- Lovenox + coumadin  Syncope --medical eval  Multiple medical problems -- Home meds  Dispo -- CIR vs home. Family has declined SNF placement. Can go to floor   LOS: 11 days    Dhyana Bastone K. 08/18/2011

## 2011-08-18 NOTE — Progress Notes (Signed)
ANTICOAGULATION CONSULT NOTE - Follow Up Consult  Pharmacy Consult for coumadin Indication: DVT  Allergies  Allergen Reactions  . Meperidine Hcl     Patient Measurements: Height: 5\' 8"  (172.7 cm) Weight: 222 lb 7.1 oz (100.9 kg) (bed scale) IBW/kg (Calculated) : 68.4    Vital Signs: Temp: 98.1 F (36.7 C) (08/11 0455) Temp src: Oral (08/11 0455) BP: 103/55 mmHg (08/11 0455) Pulse Rate: 62  (08/11 0455)  Labs:  Basename 08/18/11 0546 08/17/11 0450 08/16/11 0930 08/16/11 0500  HGB -- 14.2 -- --  HCT -- 42.3 -- --  PLT -- 340 -- --  APTT -- -- -- --  LABPROT 22.2* 22.4* 29.0* --  INR 1.91* 1.93* 2.69* --  HEPARINUNFRC -- -- -- --  CREATININE -- 0.66 -- 0.63  CKTOTAL -- -- -- --  CKMB -- -- -- --  TROPONINI -- -- -- --    Estimated Creatinine Clearance: 106 ml/min (by C-G formula based on Cr of 0.66).  Assessment: Pt is a 66 y.o male admitted with multiple rib ractures found to have DVT. Rx consult to dose coumadin, MD dosing lovenox. Today 8/11 is day 5/5 of overlap.  INR  increased to 2.36 from 1.15 on 8/9 after 2 doses of 7.5 mg, possible DDI with valproic acid. Today's INR is 1.91 CBC stable no noted bleeding.  Goal of Therapy:  INR 2-3 Monitor platelets by anticoagulation protocol: Yes   Plan:  1.Coumadin 4 mg x 1 2. F/u daily INR closely 3. Monitor for s/sx of bleeding 4. Provide warfarin education when mental status improves 5. Decrease Lovenox to 100mg  q12 given updated weight.  Celedonio Miyamoto, PharmD, BCPS Clinical Pharmacist Pager [phone]  08/18/2011 11:25 AM

## 2011-08-19 LAB — GLUCOSE, CAPILLARY
Glucose-Capillary: 171 mg/dL — ABNORMAL HIGH (ref 70–99)
Glucose-Capillary: 114 mg/dL — ABNORMAL HIGH (ref 70–99)
Glucose-Capillary: 117 mg/dL — ABNORMAL HIGH (ref 70–99)

## 2011-08-19 LAB — PROTIME-INR: Prothrombin Time: 20.7 seconds — ABNORMAL HIGH (ref 11.6–15.2)

## 2011-08-19 MED ORDER — HYDROCODONE-ACETAMINOPHEN 5-325 MG PO TABS: 1.0000 | ORAL_TABLET | ORAL | Status: DC | PRN

## 2011-08-19 MED ORDER — ACETAMINOPHEN 325 MG PO TABS: 650.0000 mg | ORAL_TABLET | Freq: Four times a day (QID) | ORAL | Status: DC | PRN

## 2011-08-19 MED ORDER — WARFARIN SODIUM 6 MG PO TABS
6.0000 mg | ORAL_TABLET | Freq: Once | ORAL | Status: AC
  Administered 2011-08-19: 6 mg via ORAL
  Filled 2011-08-19: qty 1

## 2011-08-19 MED ORDER — SODIUM CHLORIDE 0.9 % IV SOLN
INTRAVENOUS | Status: DC
  Administered 2011-08-19: 20 mL/h via INTRAVENOUS

## 2011-08-19 NOTE — Progress Notes (Signed)
Nutrition Follow-up  Intervention:   1. Continue with current nutrition interventions  2. RD will continue to follow    Assessment:   Pt was planned for home today, but per RN still with poor sounding lungs.  Pt reports fair appetite, is getting Resource and drinking it as able.   Diet Order:  Regular Supplements: Resource Breeze po TID, each supplement provides 250 kcal and 9 grams of protein. PO intake: variable, mostly >50%  Meds: Scheduled Meds:   . enoxaparin (LOVENOX) injection  1 mg/kg Subcutaneous Q12H  . feeding supplement  1 Container Oral TID BM  . guaiFENesin  1,200 mg Oral BID  . levETIRAcetam  1,000 mg Oral TID  . pantoprazole  40 mg Oral Q1200  . sodium chloride  10-40 mL Intracatheter Q12H  . Valproic Acid  500 mg Oral Daily  . warfarin  4 mg Oral ONCE-1800  . warfarin  6 mg Oral ONCE-1800  . Warfarin - Pharmacist Dosing Inpatient   Does not apply q1800  . DISCONTD: albuterol  2.5 mg Nebulization Q6H  . DISCONTD: furosemide  20 mg Intravenous Q12H  . DISCONTD: ipratropium  0.5 mg Nebulization Q6H  . DISCONTD: potassium chloride  40 mEq Oral BID   Continuous Infusions:   . sodium chloride 20 mL/hr (08/19/11 0937)   PRN Meds:.acetaminophen, HYDROcodone-acetaminophen, morphine injection, ondansetron (ZOFRAN) IV, ondansetron, sodium chloride, DISCONTD: acetylcysteine, DISCONTD: albuterol  Labs:  CMP     Component Value Date/Time   NA 140 08/17/2011 0450   K 4.0 08/17/2011 0450   CL 103 08/17/2011 0450   CO2 30 08/17/2011 0450   GLUCOSE 91 08/17/2011 0450   BUN 14 08/17/2011 0450   CREATININE 0.66 08/17/2011 0450   CALCIUM 8.9 08/17/2011 0450   PROT 6.4 08/09/2011 0420   ALBUMIN 2.8* 08/09/2011 0420   AST 15 08/09/2011 0420   ALT <5 08/09/2011 0420   ALKPHOS 85 08/09/2011 0420   BILITOT 0.7 08/09/2011 0420   GFRNONAA >90 08/17/2011 0450   GFRAA >90 08/17/2011 0450     Intake/Output Summary (Last 24 hours) at 08/19/11 1516 Last data filed at 08/19/11 1334  Gross per  24 hour  Intake    660 ml  Output   1775 ml  Net  -1115 ml    Weight Status:  221 lbs, trending down  Admission Weight: 231 lbs  Re-estimated needs:  1900-2100 kcal, 85-95 gm protein  Nutrition Dx:  Inadequate protein-energy intake now r/t poor appetite AEB variable intake   Goal:  PO intake of meals and supplements to meet >/=90% of estimated nutrition needs, likely met at this time with meals and supplements   Monitor:  PO intake, weight, labs   Clarene Duke RD, LDN Pager [phone] After Hours pager [phone]

## 2011-08-19 NOTE — Progress Notes (Signed)
Patient ID: Jeffrey Sweeney, male   DOB: 1945/08/14, 66 y.o.   MRN: [id number]    Subjective: Patient reports feeling better, breathing better and walking with PT.  Wants to go home soon.  Objective: Vital signs in last 24 hours: Temp:  [98.4 F (36.9 C)-98.6 F (37 C)] 98.4 F (36.9 C) (08/12 0428) Pulse Rate:  [60-78] 60  (08/12 0428) Resp:  [16-19] 16  (08/12 0428) BP: (119-128)/(71-76) 128/76 mmHg (08/12 0428) SpO2:  [90 %-94 %] 90 % (08/12 0811) Weight:  [221 lb 1.9 oz (100.3 kg)] 221 lb 1.9 oz (100.3 kg) (08/12 0428) Last BM Date: 08/18/11  Intake/Output from previous day: 08/11 0701 - 08/12 0700 In: 1020 [P.O.:1020] Out: 1975 [Urine:1975] Intake/Output this shift: Total I/O In: 240 [P.O.:240] Out: -   Physical Exam: Lungs: CTA bilateral , chest soft and mildly tender  Lab Results:   [facility] 08/17/11 0450  WBC 10.9*  HGB 14.2  HCT 42.3  PLT 340   BMET  Basename 08/17/11 0450  NA 140  K 4.0  CL 103  CO2 30  GLUCOSE 91  BUN 14  CREATININE 0.66  CALCIUM 8.9   PT/INR  Basename 08/19/11 0500 08/18/11 0546  LABPROT 20.7* 22.2*  INR 1.74* 1.91*   ABG No results found for this basename: PHART:2,PCO2:2,PO2:2,HCO3:2 in the last 72 hours  Studies/Results: No results found.  Anti-infectives: Anti-infectives     Start     Dose/Rate Route Frequency Ordered Stop   08/13/11 2000   moxifloxacin (AVELOX) tablet 400 mg  Status:  Discontinued        400 mg Oral Daily 08/13/11 0844 08/14/11 0844   08/08/11 2300   vancomycin (VANCOCIN) IVPB 1000 mg/200 mL premix  Status:  Discontinued        1,000 mg 200 mL/hr over 60 Minutes Intravenous Every 12 hours 08/08/11 0954 08/11/11 1539   08/08/11 1100   vancomycin (VANCOCIN) 2,500 mg in sodium chloride 0.9 % 500 mL IVPB        2,500 mg 250 mL/hr over 120 Minutes Intravenous  Once 08/08/11 0954 08/08/11 1443   08/08/11 1000   moxifloxacin (AVELOX) IVPB 400 mg  Status:  Discontinued        400 mg 250 mL/hr over  60 Minutes Intravenous Every 24 hours 08/08/11 0847 08/13/11 0844          Assessment/Plan:  Fall  Multiple left rib fxs -- Pain control and pulmonary toilet, breathing better, can go home today with home health PT Right calf DVT -- Lovenox + coumadin, INR sub-theraputic - to get 6mg  today, could go home on lovenox and coumadin, will need his primary care MD to follow INR, treat for 6 months. Syncope --medical eval  Multiple medical problems -- Home meds  Dispo -- does not qualify for inpatient rehab hospital, will benefit from home health PT.  Will work on discharge for today.   LOS: 12 days    Chiniqua Kilcrease 08/19/2011

## 2011-08-19 NOTE — Progress Notes (Signed)
Patient at a level that he can be discharged home with home health. No need for inpt rehab at this time. Please call [phone] with questions.

## 2011-08-19 NOTE — Progress Notes (Signed)
ANTICOAGULATION CONSULT NOTE - Follow Up Consult  Pharmacy Consult for Coumadin Indication: DVT  Allergies  Allergen Reactions  . Meperidine Hcl    Vital Signs: Temp: 98.4 F (36.9 C) (08/12 0428) Temp src: Oral (08/12 0428) BP: 128/76 mmHg (08/12 0428) Pulse Rate: 60  (08/12 0428)  Labs:  Basename 08/19/11 0500 08/18/11 0546 08/17/11 0450  HGB -- -- 14.2  HCT -- -- 42.3  PLT -- -- 340  APTT -- -- --  LABPROT 20.7* 22.2* 22.4*  INR 1.74* 1.91* 1.93*  HEPARINUNFRC -- -- --  CREATININE -- -- 0.66  CKTOTAL -- -- --  CKMB -- -- --  TROPONINI -- -- --    Estimated Creatinine Clearance: 105.7 ml/min (by C-G formula based on Cr of 0.66).  Assessment: 65yom continues on coumadin (Rx) with lovenox (MD) bridge for right calf DVT. He has completed the 5 days of overlap therapy. Today is day #6 but INR is below goal and continues to trend down.    8/7 - 7.5mg  8/8: INR 1.15, 7.5mg  8/9: INR 2.36, 2.69, 0mg  8/10: INR 1.93, 4mg  8/11: INR 1.91, 4mg   Continues on valproic acid - watch for DI.  Goal of Therapy:  INR 2-3  Plan:  1) Increase coumadin to 6mg  x 1 2) Continue lovenox 100mg  q12 3) Follow up INR, CBC in AM  Fredrik Rigger 08/19/2011,9:10 AM

## 2011-08-19 NOTE — Progress Notes (Signed)
Occupational Therapy Treatment Patient Details Name: Jeffrey Sweeney MRN: [id number] DOB: 08-29-1945 Today's Date: 08/19/2011 Time: 4098-1191 OT Time Calculation (min): 23 min  OT Assessment / Plan / Recommendation Comments on Treatment Session Pt is progressing well and is at adequate level for family (A) at d/c home for bathing at sink level. HOwever pt continues to remain below 90% on RA during all mobility.    Follow Up Recommendations  Home health OT    Barriers to Discharge       Equipment Recommendations  Defer to next venue    Recommendations for Other Services    Frequency Min 2X/week   Plan Discharge plan needs to be updated    Precautions / Restrictions Precautions Precautions: Fall Restrictions Weight Bearing Restrictions: No   Pertinent Vitals/Pain 87 % on RA on arrival    ADL  Grooming: Performed;Wash/dry hands;Wash/dry face;Min guard Where Assessed - Grooming: Unsupported standing Upper Body Bathing: Performed;Chest;Right arm;Left arm;Abdomen;Min guard Where Assessed - Upper Body Bathing: Unsupported standing Lower Body Bathing: Performed;Min guard Where Assessed - Lower Body Bathing: Unsupported standing Upper Body Dressing: Performed;Set up Where Assessed - Upper Body Dressing: Supported sitting Toilet Transfer: Simulated;Min guard Statistician Method: Sit to Barista: Raised toilet seat with arms (or 3-in-1 over toilet) Toileting - Clothing Manipulation and Hygiene: Performed;Min guard Where Assessed - Toileting Clothing Manipulation and Hygiene: Sit to stand from 3-in-1 or toilet ADL Comments: Pt agreeable to adls at sink level. pt found asleep with O2 87 % on RA, Pt with movement 88% RA. Pt provided 3 L ox 92%. pt with RA  sitting in chair trending down to 89% at end of session. RN Junius Roads made aware. Pt performed Adl at sink with setup Min guard. Pt progressing well for d/c home.     OT Diagnosis:    OT Problem List:   OT  Treatment Interventions:     OT Goals Acute Rehab OT Goals OT Goal Formulation: With patient Time For Goal Achievement: 08/28/11 Potential to Achieve Goals: Good ADL Goals Pt Will Perform Grooming: Standing at sink;with set-up ADL Goal: Grooming - Progress: Met Pt Will Perform Lower Body Bathing: Sit to stand from chair;with supervision ADL Goal: Lower Body Bathing - Progress: Progressing toward goals Pt Will Perform Lower Body Dressing: with supervision;Sit to stand from chair;Sit to stand from bed Pt Will Transfer to Toilet: with supervision;Ambulation;Regular height toilet ADL Goal: Toilet Transfer - Progress: Progressing toward goals Pt Will Perform Toileting - Clothing Manipulation: with supervision;Standing ADL Goal: Toileting - Clothing Manipulation - Progress: Progressing toward goals Pt Will Perform Toileting - Hygiene: with supervision;Sit to stand from 3-in-1/toilet ADL Goal: Toileting - Hygiene - Progress: Progressing toward goals Pt Will Perform Tub/Shower Transfer: Tub transfer;Shower seat with back;with supervision  Visit Information  Last OT Received On: 08/19/11 Assistance Needed: +1    Subjective Data      Prior Functioning       Cognition  Overall Cognitive Status: Impaired Area of Impairment: Attention;Safety/judgement;Memory Arousal/Alertness: Awake/alert Orientation Level: Appears intact for tasks assessed Behavior During Session: [facility] for tasks performed Current Attention Level: Sustained    Mobility Transfers Sit to Stand: 4: Min guard;With upper extremity assist;From chair/3-in-1 Stand to Sit: 4: Min guard;With upper extremity assist;To chair/3-in-1   Exercises    Balance    End of Session OT - End of Session Equipment Utilized During Treatment: Gait belt Activity Tolerance: Patient tolerated treatment well Patient left: in chair;with call bell/phone within reach;with chair alarm  set Nurse Communication: Mobility status  GO     Lucile Shutters 08/19/2011, 4:49 PM Pager: [phone]

## 2011-08-19 NOTE — Progress Notes (Signed)
Pt awakened from sleep 02 sat on RA was 87 % . Ambulated to sink in room with OT to perfom ADL's  Sat at  88 % Placed on 2 L n/c sat up to 92%. Amb back to chair and sat at 90 % on RA. Marisa Cyphers RN

## 2011-08-19 NOTE — Discharge Summary (Signed)
Physician Discharge Summary  Patient ID: Jeffrey Sweeney MRN: [id number] DOB/AGE: 04/18/1945 66 y.o.  Consults: Dr. Brooke Pace, [place],  CCM/Pulmonary DR. Lorelle Formosa, Cardiology Dr. Marthann Schiller, Medicine Dr. Kari Baars, Primary care  Admit date: 08/07/2011 Discharge date: 08/20/2011  Admission Diagnoses: Multiple rib fractures after a fall, Left 7th,  & 8th ribs. Respiratory distress, with left chest opacification, possible Left Hemothorax and chest tube insertion. COPD/tobacco use, says he quit 6 months ago. Seizure disorder, uncontrolled with hospitalization 07/27/10 PAF Hx of  Right CVA vs post ictal changes 07/2010 Hx of Bipolar/depression  Discharge Diagnoses:  Multiple rib fractures after a fall, Left 7th,  & 8th ribs. Respiratory distress, with left chest opacification, possible Left Hemothorax and chest tube insertion.  Normal Carotid studies, antegrade vertebral flow. 08/14/11 DVT  RLE  PAF with RVR, started on Coumadin and Lovenox 08/14/11. Post op fluid overload Hx CVA Confusion with history of Bipolar/Depression  Seizure disorder, uncontrolled with hospitalization 07/27/10 Hx of  Right CVA vs post ictal changes 07/2010 Family has declined SNF COPD/tobacco use, says he quit 6 months ago. Bipolar/depression. hypokalemia  Active Problems:  SYNCOPE  Seizure disorder  Rib fractures  Respiratory distress  COPD (chronic obstructive pulmonary disease)  Atelectasis  H/O chest tube placement  Hemothorax  Hypokalemia  DVT (deep venous thrombosis)  Atrial fibrillation   PROCEDURES:1.   Flexible Bronchoscopy and Bronchial alveolar lavage 08/07/11 DR. Lonia Farber,  Pulmonary Critical care service.    2.  2D Echo:  EF 50-55%, normal function, AoV normal, MV shows trivial MR  Hospital Course: this gentleman presents in transfer from [facility] He apparently had a syncopal episode yesterday and fell on his left side. He presented to  the hospital with left chest pain. He was found to have multiple rib fractures so was transferred to the hospital here for admission to the trauma service. Upon arrival here, he was found to be in worsening respiratory distress. An emergent chest x-ray showed the possibility of hemothorax versus collapse, complete opacification of the left chest. A chest tube was inserted by the emergency room physician urgently. Only a mild amount of hemothorax was relieved. The patient still has ongoing shortness of breath. He denies headache, neck pain, or abdominal pain. Prior to transfer, he had a negative CAT scan of his head and abdomen.  He underwent evaluation by CCM and bronchoscopy, with lavage followed by intubation after admission. Pulmonary signed off after intubation. He was extubated on 08/11/11, with post extubation pulmonary edema. This required diuresis, and he was placed on BiPAP. He also has problems with confusion, and some baseline dementia. He was placed on Clear liquids, 08/12/11.  He  Has slowly improved his O2 sats with diuresis, and O2 rx. He had rapid AF with rates 11-148 on 08/13/11. He had further O2 decompensation on 8/8 and had CT which showed no PE. Echo as above.  His pulmonary situation has slowly improved. CXR was marginally better on note 08/16/11.  He has had OT and PT following him and was initially referred for IP rx.  Family is refusing SNF, and he now does not meet criteria for IP, so plans are to send home with PT, and family 24 hours care. Cardiology recommends long term anticoagulation with atrial fib and history of stroke. Dr. Jacinto Halim did not think syncope related to rhythm issues. By the AM  Of 08/19/11 he is doing better. He is in sinus rhythm. We stopped his IV lasix and his nebulizer. He  will require medical follow up with DR. Hawkins in [place], later this week. He has been getting lasix daily and we have discontinued that.  He will restart his Norvasc at home tomorrow. We have set up  home health to do his PT and to draw pro times/INR and Dr. Juanetta Gosling will follow that.  He still has some rale at the base, but CXR looks better. He is on Insentive & flutter valve, and we will encourage him to continue that at home. I have talked with his daughter also, she knows about stopping lasix and restarting home Norvasc tomorrow, she also knows to watch his blood pressure at home and call Dr. Juanetta Gosling if it is low. CT suture was removed and site steri stripped.          Disposition: 01-Home or Self Care   Medication List  As of 08/20/2011 11:54 AM   STOP taking these medications         HYDROcodone-acetaminophen 7.5-325 MG per tablet         TAKE these medications         ALPRAZolam 1 MG tablet   Commonly known as: XANAX   Take 1 mg by mouth 3 (three) times daily as needed. For anxiety      amLODipine 5 MG tablet   Commonly known as: NORVASC   Take 5 mg by mouth daily.      atorvastatin 40 MG tablet   Commonly known as: LIPITOR   Take 40 mg by mouth at bedtime.      eszopiclone 3 MG Tabs   Generic drug: Eszopiclone   Take 3 mg by mouth at bedtime. Take immediately before bedtime      HYDROcodone-acetaminophen 5-325 MG per tablet   Commonly known as: NORCO/VICODIN   Take 1-2 tablets by mouth every 4 (four) hours as needed.      levETIRAcetam 1000 MG tablet   Commonly known as: KEPPRA   Take 1,000 mg by mouth 3 (three) times daily.      nitroGLYCERIN 0.4 MG SL tablet   Commonly known as: NITROSTAT   Place 0.4 mg under the tongue every 5 (five) minutes as needed. Chest pain      pantoprazole 40 MG tablet   Commonly known as: PROTONIX   Take 40 mg by mouth daily.      phenelzine 15 MG tablet   Commonly known as: NARDIL   Take 2 tablets (30 mg total) by mouth 3 (three) times daily.      potassium chloride 20 MEQ/15ML (10%) Soln   Take 20 mEq by mouth daily.      PROAIR HFA 108 (90 BASE) MCG/ACT inhaler   Generic drug: albuterol   Inhale 2 puffs into the  lungs 4 (four) times daily.      Valproic Acid 250 MG/5ML Syrp syrup   Commonly known as: DEPAKENE   Take 500 mg by mouth daily.      warfarin 5 MG tablet   Commonly known as: COUMADIN   Take 1 tablet (5 mg total) by mouth daily.           Follow-up Information    Follow up with TRAUMA MD, MD in 2 weeks. (Call our office at [phone] to make an appointment to see [place] in 2 weeks)       Follow up with HAWKINS,EDWARD L, MD in 3 days. (Have coumadin checked on Friday, also needs his O2 sats rechecked at Dr. Juanetta Gosling office.)  Contact information:   [address] [place] [address] [phone]          Signed: Sherrie George 08/20/2011, 11:54 AM

## 2011-08-19 NOTE — Progress Notes (Signed)
We are checking Sats on R/A in room and with ambulation.  Stopping IV lasix, and Nebs.  We will recheck labs in the AM.  If he does well without O2/ nebs/lasix and  labs are within normal limit we will send home tomorrow. Dr. Juanetta Gosling is aware of pt.and he can follow up with them in [place]..  Dr. Juanetta Gosling was talking to me and we were disconnected.  I could not get thru on his office line after that to complete discussion. I will see if Home Health can do INR at home.  We have alrady requested home PT.

## 2011-08-19 NOTE — Progress Notes (Signed)
Physical Therapy Treatment Patient Details Name: Jeffrey Sweeney MRN: [id number] DOB: 06-27-1945 Today's Date: 08/19/2011 Time: 3086-5784 PT Time Calculation (min): 32 min  PT Assessment / Plan / Recommendation Comments on Treatment Session  Pt with improve safety.  Pt's SaO2 maintained >90% during ambulation.  Continue to recommend 24 hour assistance and HHPT.  If not available recommend SNF.     Follow Up Recommendations  Home health PT;Supervision/Assistance - 24 hour (Recommend initial 24 hour assistance if not SNF needed)    Barriers to Discharge        Equipment Recommendations  Defer to next venue    Recommendations for Other Services    Frequency Min 3X/week   Plan Frequency remains appropriate;Discharge plan needs to be updated    Precautions / Restrictions     Pertinent Vitals/Pain No c/o pain    Mobility  Bed Mobility Bed Mobility: Supine to Sit;Sitting - Scoot to Edge of Bed Rolling Left: 4: Min guard Supine to Sit: 5: Supervision Sitting - Scoot to [place] of Bed: 5: Supervision Details for Bed Mobility Assistance: Supervision for safety with cues for technique Transfers Transfers: Sit to Stand;Stand to Sit Sit to Stand: 5: Supervision Stand to Sit: 5: Supervision Details for Transfer Assistance: Supervision for safety with less cues for impulsive nature however needs cues for hand placement Ambulation/Gait Ambulation/Gait Assistance: 4: Min guard Ambulation Distance (Feet): 200 Feet Assistive device: Rolling walker Ambulation/Gait Assistance Details: Minguard for safety with cues for hand placement and proper step sequence with body position within RW. Gait Pattern: Shuffle;Narrow base of support Gait velocity: able to vary speed safely    Exercises     PT Diagnosis:    PT Problem List:   PT Treatment Interventions:     PT Goals Acute Rehab PT Goals PT Goal Formulation: With patient Time For Goal Achievement: 08/28/11 Potential to Achieve Goals:  Good Pt will go Supine/Side to Sit: with modified independence;with HOB 0 degrees PT Goal: Supine/Side to Sit - Progress: Progressing toward goal Pt will go Sit to Stand: with supervision PT Goal: Sit to Stand - Progress: Progressing toward goal Pt will Transfer Bed to Chair/Chair to Bed: with supervision PT Transfer Goal: Bed to Chair/Chair to Bed - Progress: Progressing toward goal Pt will Ambulate: 51 - 150 feet;with supervision;with least restrictive assistive device PT Goal: Ambulate - Progress: Progressing toward goal  Visit Information  Last PT Received On: 08/19/11 Assistance Needed: +1    Subjective Data      Cognition  Overall Cognitive Status: Impaired Arousal/Alertness: Awake/alert Orientation Level: Appears intact for tasks assessed Behavior During Session: [name] for tasks performed Cognition - Other Comments: Pt less impulsive and able to listen to therapist for safety instructions.    Balance     End of Session PT - End of Session Equipment Utilized During Treatment: Gait belt Activity Tolerance: Patient tolerated treatment well Patient left: in chair;with call bell/phone within reach;with chair alarm set Nurse Communication: Mobility status   GP     Jeffrey Sweeney 08/19/2011, 12:51 PM Jake Shark, PT DPT [phone]

## 2011-08-20 ENCOUNTER — Inpatient Hospital Stay (HOSPITAL_COMMUNITY): Payer: Medicare Other

## 2011-08-20 LAB — PROTIME-INR
INR: 2.06 — ABNORMAL HIGH (ref 0.00–1.49)
Prothrombin Time: 23.6 seconds — ABNORMAL HIGH (ref 11.6–15.2)

## 2011-08-20 LAB — COMPREHENSIVE METABOLIC PANEL
ALT: 21 U/L (ref 0–53)
Albumin: 3 g/dL — ABNORMAL LOW (ref 3.5–5.2)
Alkaline Phosphatase: 94 U/L (ref 39–117)
Potassium: 4.1 mEq/L (ref 3.5–5.1)
Sodium: 140 mEq/L (ref 135–145)
Total Protein: 6.8 g/dL (ref 6.0–8.3)

## 2011-08-20 LAB — GLUCOSE, CAPILLARY: Glucose-Capillary: 114 mg/dL — ABNORMAL HIGH (ref 70–99)

## 2011-08-20 LAB — CBC
MCHC: 33.1 g/dL (ref 30.0–36.0)
RDW: 14.9 % (ref 11.5–15.5)

## 2011-08-20 MED ORDER — HYDROCODONE-ACETAMINOPHEN 5-325 MG PO TABS: 1.0000 | ORAL_TABLET | ORAL | Status: AC | PRN

## 2011-08-20 MED ORDER — WARFARIN SODIUM 5 MG PO TABS
5.0000 mg | ORAL_TABLET | Freq: Every day | ORAL | Status: DC
  Filled 2011-08-20: qty 1

## 2011-08-20 MED ORDER — WARFARIN SODIUM 5 MG PO TABS: 5.0000 mg | ORAL_TABLET | Freq: Every day | ORAL | Status: DC

## 2011-08-20 NOTE — Progress Notes (Signed)
The patient was still O2 dependent today.  Will check and see how he does off oxygen tomorrow.  This patient has been seen and I agree with the findings and treatment plan.  Marta Lamas. Gae Bon, MD, FACS [phone] (pager) [phone] (direct pager) Trauma Surgeon

## 2011-08-20 NOTE — Progress Notes (Signed)
Patient ID: Jeffrey Sweeney, male   DOB: 08/11/1945, 66 y.o.   MRN: [id number]    Subjective: Pt without complaints, denies cp/sob/severe pain, wants to go home.  Objective: Vital signs in last 24 hours: Temp:  [97.8 F (36.6 C)-98.3 F (36.8 C)] 98.3 F (36.8 C) (08/13 0420) Pulse Rate:  [66-75] 72  (08/13 0420) Resp:  [18-20] 20  (08/13 0420) BP: (110-121)/(71-74) 110/71 mmHg (08/13 0420) SpO2:  [87 %-93 %] 91 % (08/13 0635) Weight:  [216 lb 4.3 oz (98.1 kg)] 216 lb 4.3 oz (98.1 kg) (08/13 0433) Last BM Date: 08/18/11  Intake/Output from previous day: 08/12 0701 - 08/13 0700 In: 480 [P.O.:480] Out: 700 [Urine:700] Intake/Output this shift:    Physical Exam: Lungs: Upper fields with crackles which clears some with cough, lower fields clear. chest soft and mildly tender, O2 sats 90-92% while resting and partially asleep, drops to 87% with sudden awakening then goes back to 90-92%, watched patient for 5 mins, sats only drop with sudden awakening.  Lab Results:   [facility] 08/20/11 0603  WBC 11.4*  HGB 14.9  HCT 45.0  PLT 390   BMET No results found for this basename: NA:2,K:2,CL:2,CO2:2,GLUCOSE:2,BUN:2,CREATININE:2,CALCIUM:2 in the last 72 hours PT/INR  Basename 08/20/11 0603 08/19/11 0500  LABPROT 23.6* 20.7*  INR 2.06* 1.74*   ABG No results found for this basename: PHART:2,PCO2:2,PO2:2,HCO3:2 in the last 72 hours  Studies/Results: No results found.  Anti-infectives: Anti-infectives     Start     Dose/Rate Route Frequency Ordered Stop   08/13/11 2000   moxifloxacin (AVELOX) tablet 400 mg  Status:  Discontinued        400 mg Oral Daily 08/13/11 0844 08/14/11 0844   08/08/11 2300   vancomycin (VANCOCIN) IVPB 1000 mg/200 mL premix  Status:  Discontinued        1,000 mg 200 mL/hr over 60 Minutes Intravenous Every 12 hours 08/08/11 0954 08/11/11 1539   08/08/11 1100   vancomycin (VANCOCIN) 2,500 mg in sodium chloride 0.9 % 500 mL IVPB        2,500 mg 250 mL/hr  over 120 Minutes Intravenous  Once 08/08/11 0954 08/08/11 1443   08/08/11 1000   moxifloxacin (AVELOX) IVPB 400 mg  Status:  Discontinued        400 mg 250 mL/hr over 60 Minutes Intravenous Every 24 hours 08/08/11 0847 08/13/11 0844          Assessment/Plan:  Fall  Multiple left rib fxs -- Pain control and pulmonary toilet, breathing better, can go home today with home health PT, do not think he needs home O2 for now, will need follow up with Jeffrey Sweeney his primary MD later this week for recheck.   Right calf DVT -- Lovenox + coumadin, INR 2.06 - can d/c without lovenox on Coumadin 5mg  daily, needs this rechecked later this week, primary will follow INR Syncope --medical eval, signed off Multiple medical problems -- Home meds  Dispo -- does not qualify for inpatient rehab hospital, will benefit from home health PT and may need home health RN to do INR checks.   LOS: 13 days    Jeffrey Sweeney 08/20/2011

## 2011-08-20 NOTE — Discharge Summary (Signed)
This patient has been seen and I agree with the findings and treatment plan.  Yazmina Pareja O. Mikinzie Maciejewski, III, MD, FACS [phone] (pager) [phone] (direct pager) Trauma Surgeon  

## 2011-08-20 NOTE — Progress Notes (Signed)
ANTICOAGULATION CONSULT NOTE - Follow Up Consult  Pharmacy Consult for Coumadin Indication: DVT  Allergies  Allergen Reactions  . Meperidine Hcl    Vital Signs: Temp: 98.3 F (36.8 C) (08/13 0420) Temp src: Oral (08/13 0420) BP: 110/71 mmHg (08/13 0420) Pulse Rate: 72  (08/13 0420)  Labs:  Basename 08/20/11 0603 08/19/11 0500 08/18/11 0546  HGB 14.9 -- --  HCT 45.0 -- --  PLT 390 -- --  APTT -- -- --  LABPROT 23.6* 20.7* 22.2*  INR 2.06* 1.74* 1.91*  HEPARINUNFRC -- -- --  CREATININE 0.70 -- --  CKTOTAL -- -- --  CKMB -- -- --  TROPONINI -- -- --    Estimated Creatinine Clearance: 104.6 ml/min (by C-G formula based on Cr of 0.7).  Assessment: 65yom continues on coumadin (Rx) with lovenox (MD) bridge for right calf DVT. He has completed the 5 days of overlap therapy. INR is finally therapeutic. Can discontinue lovenox. CBC stable. No bleeding noted.  Goal of Therapy:  INR 2-3 Monitor platelets by anticoagulation protocol: Yes   Plan:  1) Discontinue lovenox 2) PA-C ordered coumadin 5mg  daily - agree 3) Follow up INR in AM if patient still here  Fredrik Rigger 08/20/2011,10:03 AM

## 2011-08-20 NOTE — Progress Notes (Signed)
This patient has been seen and I agree with the findings and treatment plan.  Tierre Netto O. Kalah Pflum, III, MD, FACS [phone] (pager) [phone] (direct pager) Trauma Surgeon  

## 2011-08-20 NOTE — Progress Notes (Addendum)
Spoke with daughter Suhan Paci re: d/c needs for pt.  Offered choice of agency.  HHRN and HHPT arranged with [facility] per choice.  Pt will receive services at his home and the address in EPIC is correct according to the daughter. She requests that the calls to arrange service times go to her cell phone. There are no DME needs at this time--pt has 3-in-1 and rolling walker at home already.   Daughter's cell number:  [phone].

## 2011-08-20 NOTE — Progress Notes (Signed)
This patient has been seen and I agree with the findings and treatment plan.  Alica Shellhammer O. Amarri Satterly, III, MD, FACS [phone] (pager) [phone] (direct pager) Trauma Surgeon  

## 2011-08-20 NOTE — Progress Notes (Signed)
Patient and family member received verbal and written discharge instructions. Aware of follow up appointments and medications and dressing to right upper arm can be removed on 08-21-11 from PICC line removal.  Patient and family member deny questions or concerns. Patient taken down to private vehicle via wheelchair by NT. Roberts-VonCannon, Meredyth Hornung Elon Jester

## 2011-09-10 ENCOUNTER — Ambulatory Visit (HOSPITAL_COMMUNITY): Payer: Self-pay | Admitting: Psychiatry

## 2011-09-13 ENCOUNTER — Other Ambulatory Visit (HOSPITAL_COMMUNITY): Payer: Self-pay | Admitting: Psychiatry

## 2011-09-13 DIAGNOSIS — F331 Major depressive disorder, recurrent, moderate: Secondary | ICD-10-CM

## 2011-09-26 ENCOUNTER — Ambulatory Visit (HOSPITAL_COMMUNITY): Payer: Self-pay | Admitting: Psychiatry

## 2012-04-06 ENCOUNTER — Emergency Department (HOSPITAL_COMMUNITY)
Admission: EM | Admit: 2012-04-06 | Discharge: 2012-04-06 | Disposition: A | Discharge status: Home / Self Care | Payer: Medicare Other | Attending: Emergency Medicine | Admitting: Emergency Medicine

## 2012-04-06 ENCOUNTER — Encounter (HOSPITAL_COMMUNITY): Payer: Self-pay

## 2012-04-06 DIAGNOSIS — Z8679 Personal history of other diseases of the circulatory system: Secondary | ICD-10-CM | POA: Insufficient documentation

## 2012-04-06 DIAGNOSIS — Z0000 Encounter for general adult medical examination without abnormal findings: Secondary | ICD-10-CM

## 2012-04-06 DIAGNOSIS — F3289 Other specified depressive episodes: Secondary | ICD-10-CM | POA: Insufficient documentation

## 2012-04-06 DIAGNOSIS — F17200 Nicotine dependence, unspecified, uncomplicated: Secondary | ICD-10-CM | POA: Insufficient documentation

## 2012-04-06 DIAGNOSIS — Z711 Person with feared health complaint in whom no diagnosis is made: Secondary | ICD-10-CM | POA: Insufficient documentation

## 2012-04-06 DIAGNOSIS — R4789 Other speech disturbances: Secondary | ICD-10-CM | POA: Insufficient documentation

## 2012-04-06 DIAGNOSIS — Z8673 Personal history of transient ischemic attack (TIA), and cerebral infarction without residual deficits: Secondary | ICD-10-CM | POA: Insufficient documentation

## 2012-04-06 DIAGNOSIS — I2510 Atherosclerotic heart disease of native coronary artery without angina pectoris: Secondary | ICD-10-CM | POA: Insufficient documentation

## 2012-04-06 DIAGNOSIS — R5381 Other malaise: Secondary | ICD-10-CM | POA: Insufficient documentation

## 2012-04-06 DIAGNOSIS — Z79899 Other long term (current) drug therapy: Secondary | ICD-10-CM | POA: Insufficient documentation

## 2012-04-06 DIAGNOSIS — G40909 Epilepsy, unspecified, not intractable, without status epilepticus: Secondary | ICD-10-CM | POA: Insufficient documentation

## 2012-04-06 DIAGNOSIS — Z789 Other specified health status: Secondary | ICD-10-CM | POA: Insufficient documentation

## 2012-04-06 DIAGNOSIS — F329 Major depressive disorder, single episode, unspecified: Secondary | ICD-10-CM | POA: Insufficient documentation

## 2012-04-06 NOTE — ED Provider Notes (Signed)
History  This chart was scribed for Ward Givens, MD by Bennett Scrape, ED Scribe. This patient was seen in room APA14/APA14 and the patient's care was started at 10:34 AM.  CSN: [id number]  Arrival date & time 04/06/12  1032   First MD Initiated Contact with Patient 04/06/12 1034      Chief Complaint  Patient presents with  . Aphasia    x 2 weeks    The history is provided by the patient. No language interpreter was used.    Jeffrey Sweeney is a 67 y.o. male who presents to the Emergency Department complaining of 2 weeks of slurred speech that his sister (age 23) noted 2 weeks ago, however he hasn't talked to her since then. He states he was unaware of having slurred speech.  Pt attributed the symptoms to his false teeth that he got 2 years ago but is having a hard time getting used to. He denies changes in speech and states that he wears the false teeth regularly during the day. He states that he has not spoken to his sister for the past 2 weeks. Pt had an appointment on 05/06/12 and called his PCP today to change his appointment upon his sister's request. He was wanting to talk to Dr Juanetta Gosling about getting home oxygen again and to get on patches to stop smoking.  He was then told to come to the ED after explaining his sister's concerns. He states that he will occasionally stumble to the left for the past year but denies having difficulty ambulating. 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 Pt states that his original appointment was to get back get a "quiet machine", because the O2 machine that he used to be on at night was too loud. He reports that he was also going to request "patches to stop smoking". He denies having a nebulizer machine at home. Pt denies being on any anticoagulants currently which he had been prescribed for a non-specific clot 2 years ago. Pt is  unsure of the exact diagnosis. He denies aphasia, trouble walking, HA, visual disturbance, nausea and emesis as associated symptoms. He has a h/o CAD and depression. He is a 0.5 ppd smoker but denies alcohol use.  Pt states "I feel fine, nothing is wrong".   PCP Dr Juanetta Gosling  Past Medical History  Diagnosis Date  . Heart disease   . CAD (coronary artery disease)   . Stroke     left arm weakness  . Seizures   . Depression     Past Surgical History  Procedure Laterality Date  . Ankle fracture surgery      Family History  Problem Relation Age of Onset  . Cancer Mother   . Cancer Father     History  Substance Use Topics  . Smoking status: Current Every Day Smoker -- 0.50 packs/day for 50 years    Types: Cigarettes  . Smokeless tobacco: Never Used  . Alcohol Use: No  lives by himself    Review of Systems  Constitutional: Negative for fever and chills.  Eyes: Negative for visual disturbance.  Gastrointestinal: Negative for vomiting and diarrhea.  Neurological: Negative for speech difficulty and headaches.  All other systems reviewed and are negative.    Allergies  Meperidine hcl  Home Medications   Current Outpatient Rx  Name  Route  Sig  Dispense  Refill  . albuterol (PROAIR HFA) 108 (90 BASE) MCG/ACT inhaler   Inhalation   Inhale 2 puffs into the lungs 4 (four) times daily.         Marland Kitchen ALPRAZolam (XANAX) 1 MG tablet   Oral   Take 1 mg by mouth 3 (three) times daily as needed. For anxiety         . amLODipine (NORVASC) 5 MG tablet   Oral   Take 5 mg by mouth daily.         Marland Kitchen atorvastatin (LIPITOR) 40 MG tablet   Oral   Take 40 mg by mouth at bedtime.          Marland Kitchen HYDROcodone-acetaminophen (NORCO) 7.5-325 MG per tablet   Oral   Take 1 tablet by mouth 4 (four) times daily.         Marland Kitchen levETIRAcetam (KEPPRA) 1000 MG tablet   Oral   Take 1,000 mg by mouth 3 (three) times daily.         . nitroGLYCERIN (NITROSTAT) 0.4 MG SL tablet   Sublingual    Place 0.4 mg under the tongue every 5 (five) minutes as needed. Chest pain         . pantoprazole (PROTONIX) 40 MG tablet   Oral   Take 40 mg by mouth daily.           . potassium chloride SA (K-DUR,KLOR-CON) 20 MEQ tablet   Oral   Take 20 mEq by mouth 2 (two) times daily.         . Valproic Acid (DEPAKENE) 250 MG/5ML SYRP syrup   Oral   Take 500 mg by mouth daily.            Triage Vitals: BP 146/92  Pulse 70  Temp(Src) 98.2 F (36.8 C) (Oral)  Resp 22  Ht 5\' 8"  (1.727 m)  Wt 232 lb (105.235 kg)  BMI 35.28 kg/m2  SpO2 96%  Vital signs normal    Physical Exam  Nursing note and vitals reviewed. Constitutional: He is oriented to person, place, and time. He appears well-developed and well-nourished.  Non-toxic appearance. He does not appear ill. No distress.  Speech normal  HENT:  Head: Normocephalic and atraumatic.  Right Ear: External ear normal.  Left Ear: External ear normal.  Nose: Nose normal. No mucosal edema or rhinorrhea.  Mouth/Throat: Oropharynx is clear and moist and mucous membranes are normal. No dental abscesses or edematous.  Eyes: Conjunctivae and EOM are normal. Pupils are equal, round, and reactive to light.  Neck: Normal range of motion and full passive range of motion without pain. Neck supple.  Cardiovascular: Normal rate, regular rhythm and normal heart sounds.  Exam reveals no gallop and no friction rub.   No murmur heard. Pulmonary/Chest: Effort normal and breath sounds normal. No respiratory distress. He has no wheezes. He has no rhonchi. He has no rales. He exhibits no tenderness and no crepitus.  Mildly diminished but equal   Abdominal: Soft. Normal appearance and bowel sounds are normal. He exhibits no distension. There is no tenderness. There is no rebound and no guarding.  Musculoskeletal: Normal range of motion. He exhibits no edema and no tenderness.  Moves all extremities well.   Neurological: He is alert and oriented to person,  place, and time. He has normal strength. No cranial nerve deficit.  No pronator's drift, no motor weakness in lower extremities, mild LUE weakness to grip that is baseline  Skin: Skin is warm, dry and intact. No rash noted. No erythema. No pallor.  Psychiatric: He has a normal mood and affect. His speech is normal and behavior is normal. His mood appears not anxious.    ED Course  Procedures (including critical care time)  DIAGNOSTIC STUDIES: Oxygen Saturation is 96% on room air, adequate by my interpretation.    COORDINATION OF CARE: 11:03 AM- Attempted to reach pt's sister on the phone in the pt's room with no answer.  12:29 PM- Attempted to reach pt's sister on the phone in the pt's room with no answer.   At this point I cannot see any medical justification for getting any scans because I cannot verify that patient has had any significant neurological change.    1. Normal physical exam      Date: 04/06/2012  Rate: 57  Rhythm: sinus bradycardia  QRS Axis: normal  Intervals: normal  ST/T Wave abnormalities: normal  Conduction Disutrbances:none  Narrative Interpretation:   Old EKG Reviewed: unchanged from 08/07/2011   Plan discharge  Devoria Albe, MD, FACEP    MDM  I personally performed the services described in this documentation, which was scribed in my presence. The recorded information has been reviewed and considered.  Devoria Albe, MD, Armando Gang    Ward Givens, MD 04/06/12 [phone]

## 2012-04-06 NOTE — ED Notes (Signed)
Pt was sent by dr.hawkins office, pt stated that sister has been telling him for 2 weeks that his speech has been more slurred, and he has been having trouble lifting his left leg. (pt had a stroke on the left side 2 years ago)  Pt denies any problems. Only called the office to schedule a appointment and get stop smoking patches.

## 2012-04-06 NOTE — ED Notes (Signed)
Pt ambulatory to restroom with steady gait, standby assist only.

## 2012-05-26 ENCOUNTER — Encounter (HOSPITAL_COMMUNITY): Payer: Self-pay | Admitting: *Deleted

## 2012-05-26 ENCOUNTER — Emergency Department (HOSPITAL_COMMUNITY)
Admission: EM | Admit: 2012-05-26 | Discharge: 2012-05-26 | Disposition: A | Discharge status: Home / Self Care | Payer: Medicare Other | Attending: Emergency Medicine | Admitting: Emergency Medicine

## 2012-05-26 DIAGNOSIS — L0291 Cutaneous abscess, unspecified: Secondary | ICD-10-CM

## 2012-05-26 DIAGNOSIS — M6281 Muscle weakness (generalized): Secondary | ICD-10-CM | POA: Insufficient documentation

## 2012-05-26 DIAGNOSIS — F329 Major depressive disorder, single episode, unspecified: Secondary | ICD-10-CM | POA: Insufficient documentation

## 2012-05-26 DIAGNOSIS — F3289 Other specified depressive episodes: Secondary | ICD-10-CM | POA: Insufficient documentation

## 2012-05-26 DIAGNOSIS — F17200 Nicotine dependence, unspecified, uncomplicated: Secondary | ICD-10-CM | POA: Insufficient documentation

## 2012-05-26 DIAGNOSIS — IMO0002 Reserved for concepts with insufficient information to code with codable children: Secondary | ICD-10-CM | POA: Insufficient documentation

## 2012-05-26 DIAGNOSIS — Z8679 Personal history of other diseases of the circulatory system: Secondary | ICD-10-CM | POA: Insufficient documentation

## 2012-05-26 DIAGNOSIS — I69998 Other sequelae following unspecified cerebrovascular disease: Secondary | ICD-10-CM | POA: Insufficient documentation

## 2012-05-26 DIAGNOSIS — Z79899 Other long term (current) drug therapy: Secondary | ICD-10-CM | POA: Insufficient documentation

## 2012-05-26 DIAGNOSIS — G40909 Epilepsy, unspecified, not intractable, without status epilepticus: Secondary | ICD-10-CM | POA: Insufficient documentation

## 2012-05-26 DIAGNOSIS — I10 Essential (primary) hypertension: Secondary | ICD-10-CM | POA: Insufficient documentation

## 2012-05-26 DIAGNOSIS — I2510 Atherosclerotic heart disease of native coronary artery without angina pectoris: Secondary | ICD-10-CM | POA: Insufficient documentation

## 2012-05-26 HISTORY — DX: Essential (primary) hypertension: I10

## 2012-05-26 MED ORDER — DOXYCYCLINE HYCLATE 100 MG PO CAPS: 100.0000 mg | ORAL_CAPSULE | Freq: Two times a day (BID) | ORAL | Status: DC

## 2012-05-26 MED ORDER — VANCOMYCIN HCL IN DEXTROSE 1-5 GM/200ML-% IV SOLN
1000.0000 mg | Freq: Once | INTRAVENOUS | Status: AC
  Administered 2012-05-26: 1000 mg via INTRAVENOUS
  Filled 2012-05-26: qty 200

## 2012-05-26 MED ORDER — LIDOCAINE HCL (PF) 1 % IJ SOLN
INTRAMUSCULAR | Status: AC
  Administered 2012-05-26: 5 mL
  Filled 2012-05-26: qty 5

## 2012-05-26 NOTE — ED Provider Notes (Signed)
History  This chart was scribed for Benny Lennert, MD by Bennett Scrape, ED Scribe. This patient was seen in room APA12/APA12 and the patient's care was started at 9:28 PM.  CSN: [id number]  Arrival date & time 05/26/12  2026   First MD Initiated Contact with Patient 05/26/12 2128      Chief Complaint  Patient presents with  . Cellulitis     Patient is a 68 y.o. male presenting with abscess. The history is provided by the patient. No language interpreter was used.  Abscess Location:  Shoulder/arm Shoulder/arm abscess location:  L forearm Abscess quality: painful, redness and warmth   Red streaking: no   Duration:  2 weeks Progression:  Worsening Chronicity:  New Relieved by:  Nothing Worsened by:  Nothing tried Ineffective treatments:  None tried Associated symptoms: no fatigue and no headaches     HPI Comments: Jeffrey Sweeney is a 67 y.o. male who presents to the Emergency Department complaining of 2 weeks of gradual onset, gradually worsening, constant abscess to the left posterior forearm. He denies f/u with his PCP for the symptoms. He denies fever, nausea and emesis as associated symptoms. He has a h/o CAD, HTN and CVA with chronic left sided weakness. Pt is a current everyday smoker but denies alcohol use.   Past Medical History  Diagnosis Date  . Heart disease   . CAD (coronary artery disease)   . Stroke     left arm weakness  . Seizures   . Depression   . Hypertension     Past Surgical History  Procedure Laterality Date  . Ankle fracture surgery      Family History  Problem Relation Age of Onset  . Cancer Mother   . Cancer Father     History  Substance Use Topics  . Smoking status: Current Every Day Smoker -- 0.50 packs/day for 50 years    Types: Cigarettes  . Smokeless tobacco: Never Used  . Alcohol Use: No      Review of Systems  Constitutional: Negative for appetite change and fatigue.  HENT: Negative for congestion, sinus pressure  and ear discharge.   Eyes: Negative for discharge.  Respiratory: Negative for cough.   Cardiovascular: Negative for chest pain.  Gastrointestinal: Negative for abdominal pain and diarrhea.  Genitourinary: Negative for frequency and hematuria.  Musculoskeletal: Negative for back pain.  Skin: Positive for rash and wound (abscess).  Neurological: Negative for seizures and headaches.  Psychiatric/Behavioral: Negative for hallucinations.    Allergies  Meperidine hcl  Home Medications   Current Outpatient Rx  Name  Route  Sig  Dispense  Refill  . albuterol (PROAIR HFA) 108 (90 BASE) MCG/ACT inhaler   Inhalation   Inhale 2 puffs into the lungs 4 (four) times daily.         Marland Kitchen ALPRAZolam (XANAX) 1 MG tablet   Oral   Take 1 mg by mouth 3 (three) times daily as needed. For anxiety         . amLODipine (NORVASC) 5 MG tablet   Oral   Take 5 mg by mouth daily.         Marland Kitchen atorvastatin (LIPITOR) 40 MG tablet   Oral   Take 40 mg by mouth at bedtime.          Marland Kitchen HYDROcodone-acetaminophen (NORCO) 7.5-325 MG per tablet   Oral   Take 1 tablet by mouth 4 (four) times daily.         Marland Kitchen  levETIRAcetam (KEPPRA) 1000 MG tablet   Oral   Take 1,000 mg by mouth 3 (three) times daily.         . nitroGLYCERIN (NITROSTAT) 0.4 MG SL tablet   Sublingual   Place 0.4 mg under the tongue every 5 (five) minutes as needed. Chest pain         . pantoprazole (PROTONIX) 40 MG tablet   Oral   Take 40 mg by mouth daily.           . potassium chloride SA (K-DUR,KLOR-CON) 20 MEQ tablet   Oral   Take 20 mEq by mouth 2 (two) times daily.         . Valproic Acid (DEPAKENE) 250 MG/5ML SYRP syrup   Oral   Take 500 mg by mouth daily.            Triage Vitals: BP 122/64  Pulse 61  Temp(Src) 97.9 F (36.6 C) (Oral)  Resp 20  Ht 5\' 8"  (1.727 m)  Wt 212 lb (96.163 kg)  BMI 32.24 kg/m2  SpO2 94%  Physical Exam  Nursing note and vitals reviewed. Constitutional: He is oriented to  person, place, and time. He appears well-developed and well-nourished.  HENT:  Head: Normocephalic and atraumatic.  Eyes: Conjunctivae are normal.  Neck: No tracheal deviation present.  Cardiovascular: Normal rate.   Pulmonary/Chest: Effort normal. No respiratory distress.  Musculoskeletal: Normal range of motion.  Neurological: He is alert and oriented to person, place, and time.  Skin: Skin is warm and dry.  3 cm abscess to the left forearm with a proximal rash  Psychiatric: He has a normal mood and affect. His behavior is normal.    ED Course  Procedures (including critical care time)  DIAGNOSTIC STUDIES: Oxygen Saturation is 94% on room air, adequate by my interpretation.    COORDINATION OF CARE: 9:33 PM-Discussed treatment plan which includes I&D and antibiotics with pt at bedside and pt agreed to plan.   INCISION AND DRAINAGE PROCEDURE NOTE: Patient identification was confirmed and verbal consent was obtained. This procedure was performed by Benny Lennert, MD at 10:00 PM. Site: left posterior forearm Sterile procedures observed Anesthetic used (type and amt): 2 cc of 1% lidocaine with epi Blade size: 11 Drainage: moderate amount of pus Complexity: Complex Packing used Site anesthetized, incision made over site, wound drained and explored loculations, rinsed with copious amounts of normal saline, wound packed with sterile gauze, covered with dry, sterile dressing.  Pt tolerated procedure well without complications.  Instructions for care discussed verbally and pt provided with additional written instructions for homecare and f/u.  Labs Reviewed - No data to display No results found.   No diagnosis found.    MDM        The chart was scribed for me under my direct supervision.  I personally performed the history, physical, and medical decision making and all procedures in the evaluation of this patient.Benny Lennert, MD 05/26/12 [phone]

## 2012-05-26 NOTE — ED Notes (Signed)
Cellulitis lt forearm for 1-2 weeks

## 2012-12-16 ENCOUNTER — Ambulatory Visit (HOSPITAL_COMMUNITY)
Admission: RE | Admit: 2012-12-16 | Discharge: 2012-12-16 | Disposition: A | Discharge status: Home / Self Care | Payer: Medicare Other | Source: Ambulatory Visit | Attending: Pulmonary Disease | Admitting: Pulmonary Disease

## 2012-12-16 ENCOUNTER — Other Ambulatory Visit (HOSPITAL_COMMUNITY): Payer: Self-pay | Admitting: Pulmonary Disease

## 2012-12-16 DIAGNOSIS — S20219A Contusion of unspecified front wall of thorax, initial encounter: Secondary | ICD-10-CM | POA: Insufficient documentation

## 2012-12-16 DIAGNOSIS — I10 Essential (primary) hypertension: Secondary | ICD-10-CM | POA: Insufficient documentation

## 2012-12-16 DIAGNOSIS — I2510 Atherosclerotic heart disease of native coronary artery without angina pectoris: Secondary | ICD-10-CM | POA: Insufficient documentation

## 2012-12-16 DIAGNOSIS — X58XXXA Exposure to other specified factors, initial encounter: Secondary | ICD-10-CM | POA: Insufficient documentation

## 2012-12-16 DIAGNOSIS — Y33XXXA Other specified events, undetermined intent, initial encounter: Secondary | ICD-10-CM | POA: Insufficient documentation

## 2012-12-16 DIAGNOSIS — R079 Chest pain, unspecified: Secondary | ICD-10-CM

## 2012-12-16 DIAGNOSIS — Z87891 Personal history of nicotine dependence: Secondary | ICD-10-CM | POA: Insufficient documentation

## 2012-12-16 DIAGNOSIS — Z8673 Personal history of transient ischemic attack (TIA), and cerebral infarction without residual deficits: Secondary | ICD-10-CM | POA: Insufficient documentation

## 2013-06-05 ENCOUNTER — Emergency Department (HOSPITAL_COMMUNITY)
Admission: EM | Admit: 2013-06-05 | Discharge: 2013-06-05 | Disposition: A | Discharge status: Home / Self Care | Payer: Medicare HMO | Attending: Emergency Medicine | Admitting: Emergency Medicine

## 2013-06-05 ENCOUNTER — Emergency Department (HOSPITAL_COMMUNITY): Payer: Medicare HMO

## 2013-06-05 ENCOUNTER — Encounter (HOSPITAL_COMMUNITY): Payer: Self-pay | Admitting: Emergency Medicine

## 2013-06-05 DIAGNOSIS — R079 Chest pain, unspecified: Secondary | ICD-10-CM | POA: Insufficient documentation

## 2013-06-05 DIAGNOSIS — Z8673 Personal history of transient ischemic attack (TIA), and cerebral infarction without residual deficits: Secondary | ICD-10-CM | POA: Insufficient documentation

## 2013-06-05 DIAGNOSIS — F17200 Nicotine dependence, unspecified, uncomplicated: Secondary | ICD-10-CM | POA: Insufficient documentation

## 2013-06-05 DIAGNOSIS — W010XXA Fall on same level from slipping, tripping and stumbling without subsequent striking against object, initial encounter: Secondary | ICD-10-CM | POA: Insufficient documentation

## 2013-06-05 DIAGNOSIS — F3289 Other specified depressive episodes: Secondary | ICD-10-CM | POA: Insufficient documentation

## 2013-06-05 DIAGNOSIS — Y9389 Activity, other specified: Secondary | ICD-10-CM | POA: Insufficient documentation

## 2013-06-05 DIAGNOSIS — Z79899 Other long term (current) drug therapy: Secondary | ICD-10-CM | POA: Insufficient documentation

## 2013-06-05 DIAGNOSIS — S8990XA Unspecified injury of unspecified lower leg, initial encounter: Secondary | ICD-10-CM | POA: Insufficient documentation

## 2013-06-05 DIAGNOSIS — R21 Rash and other nonspecific skin eruption: Secondary | ICD-10-CM | POA: Insufficient documentation

## 2013-06-05 DIAGNOSIS — S99919A Unspecified injury of unspecified ankle, initial encounter: Principal | ICD-10-CM

## 2013-06-05 DIAGNOSIS — F329 Major depressive disorder, single episode, unspecified: Secondary | ICD-10-CM | POA: Insufficient documentation

## 2013-06-05 DIAGNOSIS — G40909 Epilepsy, unspecified, not intractable, without status epilepticus: Secondary | ICD-10-CM | POA: Insufficient documentation

## 2013-06-05 DIAGNOSIS — M25579 Pain in unspecified ankle and joints of unspecified foot: Secondary | ICD-10-CM

## 2013-06-05 DIAGNOSIS — I10 Essential (primary) hypertension: Secondary | ICD-10-CM | POA: Insufficient documentation

## 2013-06-05 DIAGNOSIS — I2510 Atherosclerotic heart disease of native coronary artery without angina pectoris: Secondary | ICD-10-CM | POA: Insufficient documentation

## 2013-06-05 DIAGNOSIS — S99929A Unspecified injury of unspecified foot, initial encounter: Principal | ICD-10-CM

## 2013-06-05 DIAGNOSIS — Y9289 Other specified places as the place of occurrence of the external cause: Secondary | ICD-10-CM | POA: Insufficient documentation

## 2013-06-05 DIAGNOSIS — Z792 Long term (current) use of antibiotics: Secondary | ICD-10-CM | POA: Insufficient documentation

## 2013-06-05 MED ORDER — TRIAMCINOLONE ACETONIDE 0.1 % EX CREA: 1.0000 "application " | TOPICAL_CREAM | Freq: Two times a day (BID) | CUTANEOUS | Status: DC

## 2013-06-05 MED ORDER — HYDROCODONE-ACETAMINOPHEN 5-325 MG PO TABS
1.0000 | ORAL_TABLET | Freq: Four times a day (QID) | ORAL | Status: DC | PRN
Start: 2013-06-05 — End: 2014-12-31

## 2013-06-05 NOTE — Discharge Instructions (Signed)
Your x-rays are negative for fracture. Your hardware is in place. Please use Tylenol for mild pain, use Norco for more severe pain. Please see Dr. Luan Pulling for assistance with your pain control.  Please use triamcinolone cream 2 times daily to rash and peeling areas.

## 2013-06-05 NOTE — ED Provider Notes (Signed)
CSN: [id number]     Arrival date & time 06/05/13  1753 History   First MD Initiated Contact with Patient 06/05/13 1834     Chief Complaint  Patient presents with  . Ankle Pain     (Consider location/radiation/quality/duration/timing/severity/associated sxs/prior Treatment) Patient is a 68 y.o. male presenting with ankle pain. The history is provided by the patient.  Ankle Pain Location:  Ankle Injury: yes   Mechanism of injury: fall   Mechanism of injury comment:  Pt slipped and fell on a slick floor. Fall:    Fall occurred:  Standing and walking   Impact surface:  Hard floor   Point of impact:  Feet Ankle location:  R ankle Pain details:    Quality:  Sharp   Radiates to:  Does not radiate   Severity:  Moderate   Onset quality:  Sudden   Duration:  2 weeks   Timing:  Intermittent   Progression:  Worsening Chronicity: acute on chronic. Dislocation: no   Prior injury to area:  Yes Relieved by:  Nothing Exacerbated by: standing and walking. Ineffective treatments:  Acetaminophen Associated symptoms: decreased ROM   Associated symptoms: no back pain, no neck pain, no numbness and no tingling   Risk factors comment:  Previous surgery on the right ankle   Past Medical History  Diagnosis Date  . Heart disease   . CAD (coronary artery disease)   . Stroke     left arm weakness  . Seizures   . Depression   . Hypertension    Past Surgical History  Procedure Laterality Date  . Ankle fracture surgery     Family History  Problem Relation Age of Onset  . Cancer Mother   . Cancer Father    History  Substance Use Topics  . Smoking status: Current Every Day Smoker -- 0.50 packs/day for 50 years    Types: Cigarettes  . Smokeless tobacco: Never Used  . Alcohol Use: No    Review of Systems  Constitutional: Negative for activity change.       All ROS Neg except as noted in HPI  HENT: Negative for nosebleeds.   Eyes: Negative for photophobia and discharge.   Respiratory: Negative for cough, shortness of breath and wheezing.   Cardiovascular: Positive for chest pain. Negative for palpitations.  Gastrointestinal: Negative for abdominal pain and blood in stool.  Genitourinary: Negative for dysuria, frequency and hematuria.  Musculoskeletal: Negative for arthralgias, back pain and neck pain.  Skin: Negative.   Neurological: Positive for seizures. Negative for dizziness and speech difficulty.  Psychiatric/Behavioral: Negative for hallucinations and confusion.       Depression      Allergies  Meperidine hcl  Home Medications   Prior to Admission medications   Medication Sig Start Date End Date Taking? Authorizing Provider  albuterol (PROVENTIL HFA) 108 (90 BASE) MCG/ACT inhaler Inhale 2 puffs into the lungs every 6 (six) hours as needed for wheezing or shortness of breath.    Historical Provider, MD  ALPRAZolam Duanne Moron) 1 MG tablet Take 1 mg by mouth 3 (three) times daily as needed. For anxiety    Historical Provider, MD  amLODipine (NORVASC) 5 MG tablet Take 5 mg by mouth daily.    Historical Provider, MD  atorvastatin (LIPITOR) 40 MG tablet Take 40 mg by mouth at bedtime.     Historical Provider, MD  doxycycline (VIBRAMYCIN) 100 MG capsule Take 1 capsule (100 mg total) by mouth 2 (two) times daily. One po bid  x 7 days 05/26/12   Maudry Diego, MD  HYDROcodone-acetaminophen ([place]) 7.5-325 MG per tablet Take 1 tablet by mouth 4 (four) times daily.    Historical Provider, MD  levETIRAcetam (KEPPRA) 1000 MG tablet Take 1,000 mg by mouth 3 (three) times daily.    Historical Provider, MD  nitroGLYCERIN (NITROSTAT) 0.4 MG SL tablet Place 0.4 mg under the tongue every 5 (five) minutes as needed. Chest pain    Historical Provider, MD  pantoprazole (PROTONIX) 40 MG tablet Take 40 mg by mouth daily.      Historical Provider, MD  potassium chloride SA (K-DUR,KLOR-CON) 20 MEQ tablet Take 20 mEq by mouth 2 (two) times daily.    Historical Provider, MD   Valproic Acid (DEPAKENE) 250 MG/5ML SYRP syrup Take 250 mg by mouth 2 (two) times daily.     Historical Provider, MD   BP 118/59  Pulse 58  Temp(Src) 98.5 F (36.9 C) (Oral)  Resp 18  Ht 5\' 8"  (1.727 m)  Wt 212 lb (96.163 kg)  BMI 32.24 kg/m2  SpO2 96% Physical Exam  Nursing note and vitals reviewed. Constitutional: He is oriented to person, place, and time. He appears well-developed and well-nourished.  Non-toxic appearance.  HENT:  Head: Normocephalic.  Right Ear: Tympanic membrane and external ear normal.  Left Ear: Tympanic membrane and external ear normal.  Eyes: EOM and lids are normal. Pupils are equal, round, and reactive to light.  Neck: Normal range of motion. Neck supple. Carotid bruit is not present.  Cardiovascular: Normal rate, regular rhythm, normal heart sounds, intact distal pulses and normal pulses.   Pulmonary/Chest: Breath sounds normal. No respiratory distress.  Abdominal: Soft. Bowel sounds are normal. There is no tenderness. There is no guarding.  Musculoskeletal: Normal range of motion.  There is good range of motion of the right hip. There is crepitus of the right knee. There is some stiffness of the right ankle. There is soreness of the lateral and medial malleolus. The dorsalis pedis pulses 2+. No hot joints appreciated.  Lymphadenopathy:       Head (right side): No submandibular adenopathy present.       Head (left side): No submandibular adenopathy present.    He has no cervical adenopathy.  Neurological: He is alert and oriented to person, place, and time. He has normal strength. No cranial nerve deficit or sensory deficit.  Skin: Skin is warm and dry. Rash noted.  There is a peeling rash of the arms and the right knee. No red streaks appreciated. No drainage appreciated.  Psychiatric: He has a normal mood and affect. His speech is normal.    ED Course  Procedures (including critical care time) Labs Review Labs Reviewed - No data to  display  Imaging Review No results found.   EKG Interpretation None      MDM The x-ray of the right ankle is negative for new fracture or dislocation. There is no effusion of the joints appreciated. There are degenerative joint disease changes noted.  The findings were discussed with the patient in terms which he and family member can understand. Patient was asked to follow up with his primary physician. The patient also reported a peeling rash noted of his arms. He 2 areas that he said were raised areas before they started to peel. The patient will be treated with triamcinolone cream for the same he again is asked to follow with his primary physician concerning this.    Final diagnoses:  None    *  I have reviewed nursing notes, vital signs, and all appropriate lab and imaging results for this patient.Lenox Ahr, PA-C 06/05/13 1931

## 2013-06-05 NOTE — ED Notes (Addendum)
Patient c/o right ankle pain x2 weeks ago. Patient reports slipping on some water in kitchen and bending foot backwards. Patient states "I hit the top of my foot really hard." Per patient has pins in foot from old fracture. Patient has ankle brace on, which he reports relief in pain with brace. Patient states "It only hurts when I'm standing in one spot for a while. I just want to make sure I haven't re- broke it or missed those screws and puns up." Denies LOC or hitting head.

## 2013-06-05 NOTE — ED Provider Notes (Signed)
Medical screening examination/treatment/procedure(s) were performed by non-physician practitioner and as supervising physician I was immediately available for consultation/collaboration.   EKG Interpretation None        Sharyon Cable, MD 06/05/13 [phone]

## 2014-05-03 DIAGNOSIS — I10 Essential (primary) hypertension: Secondary | ICD-10-CM | POA: Diagnosis not present

## 2014-05-03 DIAGNOSIS — I6350 Cerebral infarction due to unspecified occlusion or stenosis of unspecified cerebral artery: Secondary | ICD-10-CM | POA: Diagnosis not present

## 2014-05-03 DIAGNOSIS — J449 Chronic obstructive pulmonary disease, unspecified: Secondary | ICD-10-CM | POA: Diagnosis not present

## 2014-05-03 DIAGNOSIS — R569 Unspecified convulsions: Secondary | ICD-10-CM | POA: Diagnosis not present

## 2014-07-01 NOTE — Patient Outreach (Signed)
[place] [facility]) Care Management  07/01/2014  Jeffrey Sweeney [birth year] [id number]   Self referral via [place], Sherrin Daisy, RN assigned to assist patient.  Ronnell Freshwater. [place], [place] Management [place] Assistant Phone: [phone] Fax: [phone]

## 2014-07-15 ENCOUNTER — Other Ambulatory Visit: Payer: Self-pay | Admitting: *Deleted

## 2014-07-15 NOTE — Patient Outreach (Signed)
[place] [facility]) Care Management  07/15/2014  HAMZA EMPSON 02-24-1945 [id number]  Self referral via Nurse Line:  Telephone call to patient; left message on voice mail requesting call back.  Plan: will follow up. Sherrin Daisy, RN BSN [place] Management Coordinator [facility] Care Management  [phone]

## 2014-07-18 ENCOUNTER — Other Ambulatory Visit: Payer: Self-pay | Admitting: *Deleted

## 2014-07-19 ENCOUNTER — Encounter: Payer: Self-pay | Admitting: *Deleted

## 2014-07-19 NOTE — Patient Outreach (Signed)
[facility]) Care Management  07/19/2014  Jeffrey Sweeney November 08, 1945 [id number]  Referral via [place]: Telephone call to patient; HIPPA verification received. Patient states he needs information regarding finding a primary care physician.  Per Epic patient currently has Medicare Advantage plan.  Patient advised that it would be important to get list of doctors in network if he wanted to pay network cost.. Patient states he wishes to stay in network with his medical services. Patient was advised to call customer service number on back of insurance card to get list of network primary care providers in his area. Also advised that he would need to call office of provider that he chooses to see if they are accepting patients. Patient was also given the toll free number for finding a new doctor  if he needed additional resource. Patient voices understanding of the above process and plan to start calling today.  Patient states he intends to stay with current doctor until he finds another doctor.   Current medical conditions per patient include-Coronary artery disease, chronic back disease, and angina.  Plan: will send EMMI-educational materials to patient regarding -When to Call Your Doctor Case will be closed.  Sherrin Daisy, RN BSN [place] Management Coordinator [facility] Care Management  [phone]

## 2014-12-31 ENCOUNTER — Emergency Department (HOSPITAL_COMMUNITY): Payer: Commercial Managed Care - HMO

## 2014-12-31 ENCOUNTER — Emergency Department (HOSPITAL_COMMUNITY)
Admission: EM | Admit: 2014-12-31 | Discharge: 2014-12-31 | Disposition: A | Discharge status: Home / Self Care | Payer: Commercial Managed Care - HMO | Attending: Emergency Medicine | Admitting: Emergency Medicine

## 2014-12-31 ENCOUNTER — Encounter (HOSPITAL_COMMUNITY): Payer: Self-pay | Admitting: *Deleted

## 2014-12-31 DIAGNOSIS — S300XXA Contusion of lower back and pelvis, initial encounter: Secondary | ICD-10-CM | POA: Diagnosis not present

## 2014-12-31 DIAGNOSIS — Z8673 Personal history of transient ischemic attack (TIA), and cerebral infarction without residual deficits: Secondary | ICD-10-CM | POA: Insufficient documentation

## 2014-12-31 DIAGNOSIS — Y998 Other external cause status: Secondary | ICD-10-CM | POA: Insufficient documentation

## 2014-12-31 DIAGNOSIS — W01198A Fall on same level from slipping, tripping and stumbling with subsequent striking against other object, initial encounter: Secondary | ICD-10-CM | POA: Diagnosis not present

## 2014-12-31 DIAGNOSIS — S0003XA Contusion of scalp, initial encounter: Secondary | ICD-10-CM | POA: Diagnosis not present

## 2014-12-31 DIAGNOSIS — Y9301 Activity, walking, marching and hiking: Secondary | ICD-10-CM | POA: Diagnosis not present

## 2014-12-31 DIAGNOSIS — I519 Heart disease, unspecified: Secondary | ICD-10-CM | POA: Diagnosis not present

## 2014-12-31 DIAGNOSIS — I2510 Atherosclerotic heart disease of native coronary artery without angina pectoris: Secondary | ICD-10-CM | POA: Diagnosis not present

## 2014-12-31 DIAGNOSIS — F17210 Nicotine dependence, cigarettes, uncomplicated: Secondary | ICD-10-CM | POA: Diagnosis not present

## 2014-12-31 DIAGNOSIS — F329 Major depressive disorder, single episode, unspecified: Secondary | ICD-10-CM | POA: Diagnosis not present

## 2014-12-31 DIAGNOSIS — Z79899 Other long term (current) drug therapy: Secondary | ICD-10-CM | POA: Insufficient documentation

## 2014-12-31 DIAGNOSIS — Y92001 Dining room of unspecified non-institutional (private) residence as the place of occurrence of the external cause: Secondary | ICD-10-CM | POA: Diagnosis not present

## 2014-12-31 DIAGNOSIS — S0101XA Laceration without foreign body of scalp, initial encounter: Secondary | ICD-10-CM

## 2014-12-31 DIAGNOSIS — Z23 Encounter for immunization: Secondary | ICD-10-CM | POA: Diagnosis not present

## 2014-12-31 DIAGNOSIS — W19XXXA Unspecified fall, initial encounter: Secondary | ICD-10-CM

## 2014-12-31 DIAGNOSIS — I10 Essential (primary) hypertension: Secondary | ICD-10-CM | POA: Diagnosis not present

## 2014-12-31 DIAGNOSIS — M545 Low back pain: Secondary | ICD-10-CM | POA: Diagnosis not present

## 2014-12-31 DIAGNOSIS — S2242XA Multiple fractures of ribs, left side, initial encounter for closed fracture: Secondary | ICD-10-CM | POA: Diagnosis not present

## 2014-12-31 DIAGNOSIS — S0190XA Unspecified open wound of unspecified part of head, initial encounter: Secondary | ICD-10-CM | POA: Diagnosis not present

## 2014-12-31 DIAGNOSIS — S0990XA Unspecified injury of head, initial encounter: Secondary | ICD-10-CM | POA: Diagnosis not present

## 2014-12-31 MED ORDER — PENTAFLUOROPROP-TETRAFLUOROETH EX AERO
INHALATION_SPRAY | Freq: Once | CUTANEOUS | Status: AC
  Administered 2014-12-31: 13:00:00 via TOPICAL

## 2014-12-31 MED ORDER — BACITRACIN-NEOMYCIN-POLYMYXIN 400-5-5000 EX OINT
TOPICAL_OINTMENT | Freq: Once | CUTANEOUS | Status: AC
  Administered 2014-12-31: 1 via TOPICAL
  Filled 2014-12-31: qty 1

## 2014-12-31 MED ORDER — HYDROCODONE-ACETAMINOPHEN 5-325 MG PO TABS
2.0000 | ORAL_TABLET | Freq: Once | ORAL | Status: AC
Start: 2014-12-31 — End: 2014-12-31
  Administered 2014-12-31: 2 via ORAL
  Filled 2014-12-31: qty 2

## 2014-12-31 MED ORDER — ONDANSETRON HCL 4 MG PO TABS
4.0000 mg | ORAL_TABLET | Freq: Once | ORAL | Status: AC
  Administered 2014-12-31: 4 mg via ORAL
  Filled 2014-12-31: qty 1

## 2014-12-31 MED ORDER — TETANUS-DIPHTH-ACELL PERTUSSIS 5-2.5-18.5 LF-MCG/0.5 IM SUSP
0.5000 mL | Freq: Once | INTRAMUSCULAR | Status: DC
  Filled 2014-12-31: qty 0.5
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
## 2014-12-31 NOTE — ED Notes (Signed)
PA at bedside.

## 2014-12-31 NOTE — ED Notes (Signed)
Awaiting arrival of daughter to transport pt back home.

## 2014-12-31 NOTE — Discharge Instructions (Signed)
Your CT scans and x-rays are negative for fracture or acute findings. The laceration of his scalp was repaired with 8 staples. The should be removed in 7 or 8 days. Please return sooner if any signs of advancing infection. Stitches, Staples, or Adhesive Wound Closure Health care providers use stitches (sutures), staples, and certain glue (skin adhesives) to hold skin together while it heals (wound closure). You may need this treatment after you have surgery or if you cut your skin accidentally. These methods help your skin to heal more quickly and make it less likely that you will have a scar. A wound may take several months to heal completely. The type of wound you have determines when your wound gets closed. In most cases, the wound is closed as soon as possible (primary skin closure). Sometimes, closure is delayed so the wound can be cleaned and allowed to heal naturally. This reduces the chance of infection. Delayed closure may be needed if your wound:  Is caused by a bite.  Happened more than 6 hours ago.  Involves loss of skin or the tissues under the skin.  Has dirt or debris in it that cannot be removed.  Is infected. WHAT ARE THE DIFFERENT KINDS OF WOUND CLOSURES? There are many options for wound closure. The one that your health care provider uses depends on how deep and how large your wound is. Adhesive Glue To use this type of glue to close a wound, your health care provider holds the edges of the wound together and paints the glue on the surface of your skin. You may need more than one layer of glue. Then the wound may be covered with a light bandage (dressing). This type of skin closure may be used for small wounds that are not deep (superficial). Using glue for wound closure is less painful than other methods. It does not require a medicine that numbs the area (local anesthetic). This method also leaves nothing to be removed. Adhesive glue is often used for children and on facial  wounds. Adhesive glue cannot be used for wounds that are deep, uneven, or bleeding. It is not used inside of a wound.  Adhesive Strips These strips are made of sticky (adhesive), porous paper. They are applied across your skin edges like a regular adhesive bandage. You leave them on until they fall off. Adhesive strips may be used to close very superficial wounds. They may also be used along with sutures to improve the closure of your skin edges.  Sutures Sutures are the oldest method of wound closure. Sutures can be made from natural substances, such as silk, or from synthetic materials, such as nylon and steel. They can be made from a material that your body can break down as your wound heals (absorbable), or they can be made from a material that needs to be removed from your skin (nonabsorbable). They come in many different strengths and sizes. Your health care provider attaches the sutures to a steel needle on one end. Sutures can be passed through your skin, or through the tissues beneath your skin. Then they are tied and cut. Your skin edges may be closed in one continuous stitch or in separate stitches. Sutures are strong and can be used for all kinds of wounds. Absorbable sutures may be used to close tissues under the skin. The disadvantage of sutures is that they may cause skin reactions that lead to infection. Nonabsorbable sutures need to be removed. Staples When surgical staples are used to  close a wound, the edges of your skin on both sides of the wound are brought close together. A staple is placed across the wound, and an instrument secures the edges together. Staples are often used to close surgical cuts (incisions). Staples are faster to use than sutures, and they cause less skin reaction. Staples need to be removed using a tool that bends the staples away from your skin. HOW DO I CARE FOR MY WOUND CLOSURE?  Take medicines only as directed by your health care provider.  If you were  prescribed an antibiotic medicine for your wound, finish it all even if you start to feel better.  Use ointments or creams only as directed by your health care provider.  Wash your hands with soap and water before and after touching your wound.  Do not soak your wound in water. Do not take baths, swim, or use a hot tub until your health care provider approves.  Ask your health care provider when you can start showering. Cover your wound if directed by your health care provider.  Do not take out your own sutures or staples.  Do not pick at your wound. Picking can cause an infection.  Keep all follow-up visits as directed by your health care provider. This is important. HOW LONG WILL I HAVE MY WOUND CLOSURE?  Leave adhesive glue on your skin until the glue peels away.  Leave adhesive strips on your skin until the strips fall off.  Absorbable sutures will dissolve within several days.  Nonabsorbable sutures and staples must be removed. The location of the wound will determine how long they stay in. This can range from several days to a couple of weeks. WHEN SHOULD I SEEK HELP FOR MY WOUND CLOSURE? Contact your health care provider if:  You have a fever.  You have chills.  You have drainage, redness, swelling, or pain at your wound.  There is a bad smell coming from your wound.  The skin edges of your wound start to separate after your sutures have been removed.  Your wound becomes thick, raised, and darker in color after your sutures come out (scarring).   This information is not intended to replace advice given to you by your health care provider. Make sure you discuss any questions you have with your health care provider.   Document Released: 09/18/2000 Document Revised: 01/14/2014 Document Reviewed: 06/02/2013 Elsevier Interactive Patient Education 2016 [place].  Facial or Scalp Contusion A facial or scalp contusion is a deep bruise on the face or head. Injuries to  the face and head generally cause a lot of swelling, especially around the eyes. Contusions are the result of an injury that caused bleeding under the skin. The contusion may turn blue, purple, or yellow. Minor injuries will give you a painless contusion, but more severe contusions may stay painful and swollen for a few weeks.  CAUSES  A facial or scalp contusion is caused by a blunt injury or trauma to the face or head area.  SIGNS AND SYMPTOMS   Swelling of the injured area.   Discoloration of the injured area.   Tenderness, soreness, or pain in the injured area.  DIAGNOSIS  The diagnosis can be made by taking a medical history and doing a physical exam. An X-ray exam, CT scan, or MRI may be needed to determine if there are any associated injuries, such as broken bones (fractures). TREATMENT  Often, the best treatment for a facial or scalp contusion is applying  cold compresses to the injured area. Over-the-counter medicines may also be recommended for pain control.  HOME CARE INSTRUCTIONS   Only take over-the-counter or prescription medicines as directed by your health care provider.   Apply ice to the injured area.   Put ice in a plastic bag.   Place a towel between your skin and the bag.   Leave the ice on for 20 minutes, 2-3 times a day.  SEEK MEDICAL CARE IF:  You have bite problems.   You have pain with chewing.   You are concerned about facial defects. SEEK IMMEDIATE MEDICAL CARE IF:  You have severe pain or a headache that is not relieved by medicine.   You have unusual sleepiness, confusion, or personality changes.   You throw up (vomit).   You have a persistent nosebleed.   You have double vision or blurred vision.   You have fluid drainage from your nose or ear.   You have difficulty walking or using your arms or legs.  MAKE SURE YOU:   Understand these instructions.  Will watch your condition.  Will get help right away if you are not  doing well or get worse.   This information is not intended to replace advice given to you by your health care provider. Make sure you discuss any questions you have with your health care provider.   Document Released: 02/01/2004 Document Revised: 01/14/2014 Document Reviewed: 08/06/2012 Elsevier Interactive Patient Education [employer].

## 2014-12-31 NOTE — ED Provider Notes (Signed)
History  By signing my name below, I, Marlowe Kays, attest that this documentation has been prepared under the direction and in the presence of Lily Kocher, PA-C. Electronically Signed: Marlowe Kays, ED Scribe. 12/31/2014. 12:07 PM.  Chief Complaint  Patient presents with  . Fall   HPI  HPI Comments:  Jeffrey Sweeney is a 69 y.o. male who presents to the Emergency Department complaining of a fall that occurred approximately one hour ago. He states he was walking in his house and tripped over something, causing him to fall and hit his posterior head. He states he believes he was unconscious for a couple of minutes. He reports eventually being able to pull himself off the floor. He reports lower back pain. He has not taken anything for pain. He denies modifying factors. He denies wrist pain, elbow pain, shoulder pain, rib pain, abdominal pain, nausea, vomiting, numbness, tingling or weakness of the lower extremities. He denies anticoagulant therapy. He is unsure of last tetanus vaccination.   Past Medical History  Diagnosis Date  . Heart disease   . CAD (coronary artery disease)   . Stroke [facility])     left arm weakness  . Seizures ([place])   . Depression   . Hypertension    Past Surgical History  Procedure Laterality Date  . Ankle fracture surgery     Family History  Problem Relation Age of Onset  . Cancer Mother   . Cancer Father    Social History  Substance Use Topics  . Smoking status: Current Every Day Smoker -- 0.50 packs/day for 50 years    Types: Cigarettes  . Smokeless tobacco: Never Used  . Alcohol Use: No    Review of Systems  Gastrointestinal: Negative for nausea, vomiting and abdominal pain.  Musculoskeletal: Positive for back pain.  Skin: Negative for color change and wound.  Neurological: Negative for weakness and numbness.  All other systems reviewed and are negative.  Allergies  Meperidine hcl  Home Medications   Prior to Admission medications    Medication Sig Start Date End Date Taking? Authorizing Provider  albuterol (PROVENTIL HFA) 108 (90 BASE) MCG/ACT inhaler Inhale 2 puffs into the lungs every 6 (six) hours as needed for wheezing or shortness of breath.   Yes Historical Provider, MD  ALPRAZolam Duanne Moron) 1 MG tablet Take 1 mg by mouth 3 (three) times daily as needed. For anxiety   Yes Historical Provider, MD  amLODipine (NORVASC) 5 MG tablet Take 5 mg by mouth daily.   Yes Historical Provider, MD  atorvastatin (LIPITOR) 40 MG tablet Take 40 mg by mouth at bedtime.    Yes Historical Provider, MD  levETIRAcetam (KEPPRA) 1000 MG tablet Take 1,000 mg by mouth 3 (three) times daily.   Yes Historical Provider, MD  nitroGLYCERIN (NITROSTAT) 0.4 MG SL tablet Place 0.4 mg under the tongue every 5 (five) minutes as needed. Chest pain   Yes Historical Provider, MD  pantoprazole (PROTONIX) 40 MG tablet Take 40 mg by mouth daily.     Yes Historical Provider, MD  potassium chloride SA (K-DUR,KLOR-CON) 20 MEQ tablet Take 20 mEq by mouth 2 (two) times daily.   Yes Historical Provider, MD  traMADol (ULTRAM) 50 MG tablet Take 50 mg by mouth 4 (four) times daily.   Yes Historical Provider, MD  Valproic Acid (DEPAKENE) 250 MG/5ML SYRP syrup Take 250 mg by mouth 2 (two) times daily.    Yes Historical Provider, MD   Triage Vitals: BP 119/79 mmHg  Pulse  125  Temp(Src) 98 F (36.7 C) (Oral)  Resp 20  Ht 5\' 8"  (1.727 m)  Wt 200 lb (90.719 kg)  BMI 30.42 kg/m2  SpO2 93% Physical Exam  Constitutional: He is oriented to person, place, and time. He appears well-developed and well-nourished.  HENT:  Head: Normocephalic.  3.7 cm laceration of posterior scalp  Eyes: EOM are normal.  Neck: Normal range of motion.  Cardiovascular: Normal rate.   Pulmonary/Chest: Effort normal.  Speaks in complete sentences. Symmetrical rise and fall of the chest.  Musculoskeletal: Normal range of motion.  Pain of lower back with change of position  Neurological: He is  alert and oriented to person, place, and time.  No neurological deficits appreciated  Skin: Skin is warm and dry.  Psychiatric: He has a normal mood and affect. His behavior is normal.  Nursing note and vitals reviewed.   ED Course  .Marland KitchenLaceration Repair Date/Time: 12/31/2014 12:55 PM Performed by: Lily Kocher Authorized by: Lily Kocher Consent: Verbal consent obtained. Risks and benefits: risks, benefits and alternatives were discussed Consent given by: patient Patient understanding: patient states understanding of the procedure being performed Patient identity confirmed: arm band Time out: Immediately prior to procedure a "time out" was called to verify the correct patient, procedure, equipment, support staff and site/side marked as required. Body area: head/neck Location details: scalp Laceration length: 3.7 cm Foreign bodies: no foreign bodies Local anesthetic: topical anesthetic Patient sedated: no Preparation: Patient was prepped and draped in the usual sterile fashion. Irrigation solution: tap water Amount of cleaning: standard Skin closure: staples Number of sutures: 8 Approximation: close Approximation difficulty: simple Patient tolerance: Patient tolerated the procedure well with no immediate complications   (including critical care time) DIAGNOSTIC STUDIES: Oxygen Saturation is 93% on RA, low by my interpretation.   COORDINATION OF CARE: 9:43 AM- Will order CT of head and neck and CXR and L-spine X-Ray. Pt verbalizes understanding and agrees to plan.  Medications  Tdap (BOOSTRIX) injection 0.5 mL (0.5 mLs Intramuscular Not Given 12/31/14 1020)  pentafluoroprop-tetrafluoroeth (GEBAUERS) aerosol (not administered)  HYDROcodone-acetaminophen (NORCO/VICODIN) 5-325 MG per tablet 2 tablet (2 tablets Oral Given 12/31/14 1019)  ondansetron (ZOFRAN) tablet 4 mg (4 mg Oral Given 12/31/14 1019)    Labs Review Labs Reviewed - No data to display  Imaging Review Dg  Chest 1 View  12/31/2014  CLINICAL DATA:  Fall. EXAM: CHEST 1 VIEW COMPARISON:  12/16/2012 FINDINGS: Mild cardiac enlargement. No pleural effusion or edema. No airspace consolidation. Left posterior rib fracture deformities are again noted, chronic. IMPRESSION: 1. No acute cardiopulmonary abnormalities. 2. Chronic left posterior rib fractures. Electronically Signed   By: Kerby Moors M.D.   On: 12/31/2014 10:52   Dg Lumbar Spine Complete  12/31/2014  CLINICAL DATA:  Status post fall at home with pain of lower back. EXAM: LUMBAR SPINE - COMPLETE 4+ VIEW COMPARISON:  CT abdomen pelvis August 07, 2011 FINDINGS: There is 50% compression deformity of L1. There is 20% compression deformity of L2 and L3. No definite cortical discontinuity is identified in L1, L2 and L3. Degenerative joint changes are identified throughout lumbar spine. There is no dislocation. There is chronic posttraumatic change of the left tenth rib present on prior CT of 2013. IMPRESSION: Compression deformities of L1 through L3, probably chronic. There is no dislocation. Electronically Signed   By: Abelardo Diesel M.D.   On: 12/31/2014 10:51   Ct Head Wo Contrast  12/31/2014  CLINICAL DATA:  Trauma. Fall. Hit head 1  hour ago. Loss of consciousness and laceration to posterior head. EXAM: CT HEAD WITHOUT CONTRAST CT CERVICAL SPINE WITHOUT CONTRAST TECHNIQUE: Multidetector CT imaging of the head and cervical spine was performed following the standard protocol without intravenous contrast. Multiplanar CT image reconstructions of the cervical spine were also generated. COMPARISON:  08/07/2011 FINDINGS: CT HEAD FINDINGS Encephalomalacia involving the right cerebral hemisphere is again noted and appears unchanged from previous exam compatible with chronic MCA infarct. Prominence of the sulci and ventricles noted consistent with brain atrophy. No evidence for acute brain infarct, acute intracranial hemorrhage or mass. Chronic opacification of the left  maxillary sinus identified. The mastoid air cells are clear. The the calvarium is intact. Posterior scout laceration and hematoma identified CT CERVICAL SPINE FINDINGS Normal alignment of the cervical spine. The vertebral body heights are well preserved. Disc space narrowing and ventral endplate spurring is noted. Most advanced at C5-6 and C6-7. The facet joints are all well aligned. The prevertebral soft tissue space appears normal. No fractures or subluxations identified. IMPRESSION: 1. No acute intracranial abnormalities. 2. Chronic right MCA infarct. 3. Chronic left maxillary sinus disease. 4. Posterior scalp laceration 5. No evidence for cervical spine fracture. 6. Cervical degenerative disc disease noted. Electronically Signed   By: Kerby Moors M.D.   On: 12/31/2014 11:12   Ct Cervical Spine Wo Contrast  12/31/2014  CLINICAL DATA:  Trauma. Fall. Hit head 1 hour ago. Loss of consciousness and laceration to posterior head. EXAM: CT HEAD WITHOUT CONTRAST CT CERVICAL SPINE WITHOUT CONTRAST TECHNIQUE: Multidetector CT imaging of the head and cervical spine was performed following the standard protocol without intravenous contrast. Multiplanar CT image reconstructions of the cervical spine were also generated. COMPARISON:  08/07/2011 FINDINGS: CT HEAD FINDINGS Encephalomalacia involving the right cerebral hemisphere is again noted and appears unchanged from previous exam compatible with chronic MCA infarct. Prominence of the sulci and ventricles noted consistent with brain atrophy. No evidence for acute brain infarct, acute intracranial hemorrhage or mass. Chronic opacification of the left maxillary sinus identified. The mastoid air cells are clear. The the calvarium is intact. Posterior scout laceration and hematoma identified CT CERVICAL SPINE FINDINGS Normal alignment of the cervical spine. The vertebral body heights are well preserved. Disc space narrowing and ventral endplate spurring is noted. Most  advanced at C5-6 and C6-7. The facet joints are all well aligned. The prevertebral soft tissue space appears normal. No fractures or subluxations identified. IMPRESSION: 1. No acute intracranial abnormalities. 2. Chronic right MCA infarct. 3. Chronic left maxillary sinus disease. 4. Posterior scalp laceration 5. No evidence for cervical spine fracture. 6. Cervical degenerative disc disease noted. Electronically Signed   By: Kerby Moors M.D.   On: 12/31/2014 11:12   I have personally reviewed and evaluated these images and lab results as part of my medical decision-making.   EKG Interpretation None      MDM  Vital signs reviewed. No gross neurologic deficit noted on examination. Patient has pain with change of position of the involving the lower back. CT scan of the head reveals some encephalomalacia which is not new. There is no brain infarct, skull fracture, hemorrhage or mass appreciated. There is some opacification of the left maxillary sinus noted. CT scan of the cervical spine shows advanced degenerative changes at C5-C6 and C6-C7, but no fracture no dislocation. L-spine shows compression fractures from L1-L3, but these have a chronic appearance. There are degenerative changes noted throughout the lumbar spine. X-ray of the chest is negative for  acute cardiopulmonary abnormality. There are noted some chronic left posterior rib fractures.    Final diagnoses:  Fall, initial encounter    *I have reviewed nursing notes, vital signs, and all appropriate lab and imaging results for this patient.** I personally performed the services described in this documentation, which was scribed in my presence. The recorded information has been reviewed and is accurate.    Lily Kocher, PA-C 12/31/14 [place], MD 01/01/15 [phone]

## 2015-01-05 ENCOUNTER — Inpatient Hospital Stay (HOSPITAL_COMMUNITY): Payer: Commercial Managed Care - HMO

## 2015-01-05 ENCOUNTER — Encounter (HOSPITAL_COMMUNITY): Payer: Self-pay | Admitting: *Deleted

## 2015-01-05 ENCOUNTER — Emergency Department (HOSPITAL_COMMUNITY): Payer: Commercial Managed Care - HMO

## 2015-01-05 ENCOUNTER — Inpatient Hospital Stay (HOSPITAL_COMMUNITY)
Admission: EM | Admit: 2015-01-05 | Discharge: 2015-01-07 | DRG: 191 | Disposition: A | Discharge status: Skilled Nursing Facility | Payer: Commercial Managed Care - HMO | Attending: Pulmonary Disease | Admitting: Pulmonary Disease

## 2015-01-05 DIAGNOSIS — Z8673 Personal history of transient ischemic attack (TIA), and cerebral infarction without residual deficits: Secondary | ICD-10-CM

## 2015-01-05 DIAGNOSIS — F3160 Bipolar disorder, current episode mixed, unspecified: Secondary | ICD-10-CM | POA: Diagnosis present

## 2015-01-05 DIAGNOSIS — R0602 Shortness of breath: Secondary | ICD-10-CM | POA: Diagnosis not present

## 2015-01-05 DIAGNOSIS — R569 Unspecified convulsions: Secondary | ICD-10-CM | POA: Diagnosis not present

## 2015-01-05 DIAGNOSIS — I4819 Other persistent atrial fibrillation: Secondary | ICD-10-CM

## 2015-01-05 DIAGNOSIS — J189 Pneumonia, unspecified organism: Secondary | ICD-10-CM | POA: Diagnosis not present

## 2015-01-05 DIAGNOSIS — W19XXXA Unspecified fall, initial encounter: Secondary | ICD-10-CM

## 2015-01-05 DIAGNOSIS — R05 Cough: Secondary | ICD-10-CM | POA: Diagnosis not present

## 2015-01-05 DIAGNOSIS — I10 Essential (primary) hypertension: Secondary | ICD-10-CM | POA: Diagnosis not present

## 2015-01-05 DIAGNOSIS — M6281 Muscle weakness (generalized): Secondary | ICD-10-CM | POA: Diagnosis not present

## 2015-01-05 DIAGNOSIS — I517 Cardiomegaly: Secondary | ICD-10-CM | POA: Diagnosis present

## 2015-01-05 DIAGNOSIS — Z743 Need for continuous supervision: Secondary | ICD-10-CM | POA: Diagnosis not present

## 2015-01-05 DIAGNOSIS — M810 Age-related osteoporosis without current pathological fracture: Secondary | ICD-10-CM | POA: Diagnosis present

## 2015-01-05 DIAGNOSIS — J9611 Chronic respiratory failure with hypoxia: Secondary | ICD-10-CM | POA: Diagnosis not present

## 2015-01-05 DIAGNOSIS — J441 Chronic obstructive pulmonary disease with (acute) exacerbation: Principal | ICD-10-CM | POA: Diagnosis present

## 2015-01-05 DIAGNOSIS — Z79899 Other long term (current) drug therapy: Secondary | ICD-10-CM

## 2015-01-05 DIAGNOSIS — R261 Paralytic gait: Secondary | ICD-10-CM | POA: Diagnosis not present

## 2015-01-05 DIAGNOSIS — R0600 Dyspnea, unspecified: Secondary | ICD-10-CM

## 2015-01-05 DIAGNOSIS — R296 Repeated falls: Secondary | ICD-10-CM | POA: Diagnosis present

## 2015-01-05 DIAGNOSIS — J019 Acute sinusitis, unspecified: Secondary | ICD-10-CM | POA: Diagnosis not present

## 2015-01-05 DIAGNOSIS — I951 Orthostatic hypotension: Secondary | ICD-10-CM | POA: Diagnosis present

## 2015-01-05 DIAGNOSIS — I2510 Atherosclerotic heart disease of native coronary artery without angina pectoris: Secondary | ICD-10-CM | POA: Diagnosis present

## 2015-01-05 DIAGNOSIS — F17210 Nicotine dependence, cigarettes, uncomplicated: Secondary | ICD-10-CM | POA: Diagnosis not present

## 2015-01-05 DIAGNOSIS — M4856XA Collapsed vertebra, not elsewhere classified, lumbar region, initial encounter for fracture: Secondary | ICD-10-CM | POA: Diagnosis present

## 2015-01-05 DIAGNOSIS — R1310 Dysphagia, unspecified: Secondary | ICD-10-CM | POA: Diagnosis not present

## 2015-01-05 DIAGNOSIS — F3162 Bipolar disorder, current episode mixed, moderate: Secondary | ICD-10-CM | POA: Diagnosis present

## 2015-01-05 DIAGNOSIS — I119 Hypertensive heart disease without heart failure: Secondary | ICD-10-CM | POA: Diagnosis present

## 2015-01-05 DIAGNOSIS — R55 Syncope and collapse: Secondary | ICD-10-CM | POA: Diagnosis present

## 2015-01-05 DIAGNOSIS — F319 Bipolar disorder, unspecified: Secondary | ICD-10-CM | POA: Diagnosis not present

## 2015-01-05 DIAGNOSIS — F419 Anxiety disorder, unspecified: Secondary | ICD-10-CM | POA: Diagnosis not present

## 2015-01-05 DIAGNOSIS — M545 Low back pain: Secondary | ICD-10-CM | POA: Diagnosis not present

## 2015-01-05 DIAGNOSIS — R404 Transient alteration of awareness: Secondary | ICD-10-CM | POA: Diagnosis not present

## 2015-01-05 DIAGNOSIS — R2681 Unsteadiness on feet: Secondary | ICD-10-CM | POA: Diagnosis not present

## 2015-01-05 DIAGNOSIS — I482 Chronic atrial fibrillation: Secondary | ICD-10-CM | POA: Diagnosis present

## 2015-01-05 DIAGNOSIS — G40909 Epilepsy, unspecified, not intractable, without status epilepticus: Secondary | ICD-10-CM

## 2015-01-05 DIAGNOSIS — R079 Chest pain, unspecified: Secondary | ICD-10-CM | POA: Diagnosis not present

## 2015-01-05 DIAGNOSIS — I4891 Unspecified atrial fibrillation: Secondary | ICD-10-CM | POA: Diagnosis present

## 2015-01-05 DIAGNOSIS — S2239XA Fracture of one rib, unspecified side, initial encounter for closed fracture: Secondary | ICD-10-CM | POA: Diagnosis present

## 2015-01-05 DIAGNOSIS — R0902 Hypoxemia: Secondary | ICD-10-CM | POA: Diagnosis present

## 2015-01-05 DIAGNOSIS — R509 Fever, unspecified: Secondary | ICD-10-CM | POA: Diagnosis not present

## 2015-01-05 DIAGNOSIS — R531 Weakness: Secondary | ICD-10-CM | POA: Diagnosis not present

## 2015-01-05 DIAGNOSIS — S3992XA Unspecified injury of lower back, initial encounter: Secondary | ICD-10-CM | POA: Diagnosis not present

## 2015-01-05 DIAGNOSIS — S2249XA Multiple fractures of ribs, unspecified side, initial encounter for closed fracture: Secondary | ICD-10-CM | POA: Diagnosis present

## 2015-01-05 LAB — BASIC METABOLIC PANEL
ANION GAP: 7 (ref 5–15)
BUN: 13 mg/dL (ref 6–20)
CALCIUM: 8.8 mg/dL — AB (ref 8.9–10.3)
CHLORIDE: 96 mmol/L — AB (ref 101–111)
CO2: 32 mmol/L (ref 22–32)
Creatinine, Ser: 0.92 mg/dL (ref 0.61–1.24)
GFR calc Af Amer: >60 mL/min (ref >60)
GFR calc non Af Amer: >60 mL/min (ref >60)
GLUCOSE: 103 mg/dL — AB (ref 65–99)
Potassium: 3.8 mmol/L (ref 3.5–5.1)
Sodium: 135 mmol/L (ref 135–145)

## 2015-01-05 LAB — CK: Total CK: 201 U/L (ref 49–397)

## 2015-01-05 LAB — BLOOD GAS, ARTERIAL
Acid-Base Excess: 5.3 mmol/L — ABNORMAL HIGH (ref 0.0–2.0)
Bicarbonate: 28.8 mEq/L — ABNORMAL HIGH (ref 20.0–24.0)
Drawn by: 277331
FIO2: 0.21
O2 Saturation: 90.1 %
PCO2 ART: 42.7 mmHg (ref 35.0–45.0)
PH ART: 7.45 (ref 7.350–7.450)
PO2 ART: 62 mmHg — AB (ref 80.0–100.0)
Patient temperature: 37

## 2015-01-05 LAB — CBC WITH DIFFERENTIAL/PLATELET
BASOS ABS: 0 10*3/uL (ref 0.0–0.1)
Basophils Relative: 0 %
Eosinophils Absolute: 0.2 10*3/uL (ref 0.0–0.7)
Eosinophils Relative: 2 %
HEMATOCRIT: 44.9 % (ref 39.0–52.0)
Hemoglobin: 14.9 g/dL (ref 13.0–17.0)
LYMPHS PCT: 15 %
Lymphs Abs: 1.4 10*3/uL (ref 0.7–4.0)
MCH: 32.8 pg (ref 26.0–34.0)
MCHC: 33.2 g/dL (ref 30.0–36.0)
MCV: 98.9 fL (ref 78.0–100.0)
MONO ABS: 0.8 10*3/uL (ref 0.1–1.0)
MONOS PCT: 9 %
NEUTROS ABS: 6.8 10*3/uL (ref 1.7–7.7)
Neutrophils Relative %: 74 %
Platelets: 241 10*3/uL (ref 150–400)
RBC: 4.54 MIL/uL (ref 4.22–5.81)
RDW: 14.4 % (ref 11.5–15.5)
WBC: 9.3 10*3/uL (ref 4.0–10.5)

## 2015-01-05 LAB — D-DIMER, QUANTITATIVE: D-Dimer, Quant: 3.62 ug/mL-FEU — ABNORMAL HIGH (ref 0.00–0.50)

## 2015-01-05 LAB — I-STAT CG4 LACTIC ACID, ED: Lactic Acid, Venous: 1.24 mmol/L (ref 0.5–2.0)

## 2015-01-05 LAB — URINALYSIS, ROUTINE W REFLEX MICROSCOPIC
GLUCOSE, UA: NEGATIVE mg/dL
Hgb urine dipstick: NEGATIVE
KETONES UR: 15 mg/dL — AB
LEUKOCYTES UA: NEGATIVE
Nitrite: NEGATIVE
PH: 5.5 (ref 5.0–8.0)
Protein, ur: NEGATIVE mg/dL
SPECIFIC GRAVITY, URINE: 1.025 (ref 1.005–1.030)

## 2015-01-05 LAB — POC OCCULT BLOOD, ED: Fecal Occult Bld: NEGATIVE

## 2015-01-05 LAB — TROPONIN I: Troponin I: <0.03 ng/mL (ref <0.031)

## 2015-01-05 MED ORDER — ONDANSETRON HCL 4 MG PO TABS: 4.0000 mg | ORAL_TABLET | Freq: Four times a day (QID) | ORAL | Status: DC | PRN

## 2015-01-05 MED ORDER — METHYLPREDNISOLONE SODIUM SUCC 125 MG IJ SOLR: 80.0000 mg | Freq: Two times a day (BID) | INTRAMUSCULAR | Status: DC

## 2015-01-05 MED ORDER — ATORVASTATIN CALCIUM 40 MG PO TABS
40.0000 mg | ORAL_TABLET | Freq: Every day | ORAL | Status: DC
  Administered 2015-01-05 – 2015-01-06 (×2): 40 mg via ORAL
  Filled 2015-01-05 (×2): qty 1

## 2015-01-05 MED ORDER — METHYLPREDNISOLONE SODIUM SUCC 125 MG IJ SOLR
80.0000 mg | Freq: Two times a day (BID) | INTRAMUSCULAR | Status: DC
  Administered 2015-01-05 – 2015-01-07 (×4): 80 mg via INTRAVENOUS
  Filled 2015-01-05 (×5): qty 2

## 2015-01-05 MED ORDER — ALBUTEROL SULFATE HFA 108 (90 BASE) MCG/ACT IN AERS
2.0000 | INHALATION_SPRAY | Freq: Four times a day (QID) | RESPIRATORY_TRACT | Status: DC | PRN
  Filled 2015-01-05: qty 6.7

## 2015-01-05 MED ORDER — OXYCODONE HCL 5 MG PO TABS
5.0000 mg | ORAL_TABLET | ORAL | Status: DC | PRN
  Administered 2015-01-05 – 2015-01-06 (×3): 5 mg via ORAL
  Filled 2015-01-05 (×3): qty 1

## 2015-01-05 MED ORDER — ONDANSETRON HCL 4 MG/2ML IJ SOLN: 4.0000 mg | Freq: Four times a day (QID) | INTRAMUSCULAR | Status: DC | PRN

## 2015-01-05 MED ORDER — IOHEXOL 350 MG/ML SOLN
100.0000 mL | Freq: Once | INTRAVENOUS | Status: AC | PRN
  Administered 2015-01-05: 100 mL via INTRAVENOUS

## 2015-01-05 MED ORDER — ALBUTEROL SULFATE (2.5 MG/3ML) 0.083% IN NEBU: 3.0000 mL | INHALATION_SOLUTION | Freq: Four times a day (QID) | RESPIRATORY_TRACT | Status: DC | PRN

## 2015-01-05 MED ORDER — POTASSIUM CHLORIDE CRYS ER 20 MEQ PO TBCR
20.0000 meq | EXTENDED_RELEASE_TABLET | Freq: Two times a day (BID) | ORAL | Status: DC
  Administered 2015-01-05 – 2015-01-06 (×3): 20 meq via ORAL
  Filled 2015-01-05 (×4): qty 1

## 2015-01-05 MED ORDER — AMLODIPINE BESYLATE 5 MG PO TABS: 5.0000 mg | ORAL_TABLET | Freq: Every day | ORAL | Status: DC

## 2015-01-05 MED ORDER — LEVETIRACETAM 500 MG PO TABS
1000.0000 mg | ORAL_TABLET | Freq: Three times a day (TID) | ORAL | Status: DC
  Administered 2015-01-05 – 2015-01-07 (×5): 1000 mg via ORAL
  Filled 2015-01-05 (×5): qty 2

## 2015-01-05 MED ORDER — FENTANYL CITRATE (PF) 100 MCG/2ML IJ SOLN
50.0000 ug | Freq: Once | INTRAMUSCULAR | Status: AC
  Administered 2015-01-05: 50 ug via INTRAVENOUS
  Filled 2015-01-05: qty 2

## 2015-01-05 MED ORDER — ENOXAPARIN SODIUM 40 MG/0.4ML ~~LOC~~ SOLN
40.0000 mg | SUBCUTANEOUS | Status: DC
  Administered 2015-01-05 – 2015-01-06 (×2): 40 mg via SUBCUTANEOUS
  Filled 2015-01-05 (×2): qty 0.4

## 2015-01-05 MED ORDER — AMLODIPINE BESYLATE 5 MG PO TABS
5.0000 mg | ORAL_TABLET | Freq: Every day | ORAL | Status: DC
  Administered 2015-01-06 – 2015-01-07 (×2): 5 mg via ORAL
  Filled 2015-01-05 (×2): qty 1

## 2015-01-05 MED ORDER — ENOXAPARIN SODIUM 40 MG/0.4ML ~~LOC~~ SOLN: 40.0000 mg | SUBCUTANEOUS | Status: DC

## 2015-01-05 MED ORDER — NITROGLYCERIN 0.4 MG SL SUBL: 0.4000 mg | SUBLINGUAL_TABLET | SUBLINGUAL | Status: DC | PRN

## 2015-01-05 MED ORDER — PANTOPRAZOLE SODIUM 40 MG PO TBEC: 40.0000 mg | DELAYED_RELEASE_TABLET | Freq: Every day | ORAL | Status: DC

## 2015-01-05 MED ORDER — VALPROIC ACID 250 MG/5ML PO SYRP
250.0000 mg | ORAL_SOLUTION | Freq: Two times a day (BID) | ORAL | Status: DC
  Administered 2015-01-05 – 2015-01-07 (×4): 250 mg via ORAL
  Filled 2015-01-05 (×8): qty 5

## 2015-01-05 MED ORDER — SODIUM CHLORIDE 0.9 % IV BOLUS (SEPSIS)
1000.0000 mL | Freq: Once | INTRAVENOUS | Status: AC
  Administered 2015-01-05: 1000 mL via INTRAVENOUS

## 2015-01-05 MED ORDER — ALPRAZOLAM 1 MG PO TABS
1.0000 mg | ORAL_TABLET | Freq: Three times a day (TID) | ORAL | Status: DC | PRN
  Administered 2015-01-05: 1 mg via ORAL
  Filled 2015-01-05: qty 1

## 2015-01-05 MED ORDER — PANTOPRAZOLE SODIUM 40 MG PO TBEC
40.0000 mg | DELAYED_RELEASE_TABLET | Freq: Every day | ORAL | Status: DC
  Administered 2015-01-06 – 2015-01-07 (×2): 40 mg via ORAL
  Filled 2015-01-05 (×2): qty 1

## 2015-01-05 MED ORDER — VALPROATE SODIUM 250 MG/5ML PO SYRP
ORAL_SOLUTION | ORAL | Status: AC
  Filled 2015-01-05: qty 5

## 2015-01-05 MED ORDER — SODIUM CHLORIDE 0.9 % IJ SOLN
3.0000 mL | Freq: Two times a day (BID) | INTRAMUSCULAR | Status: DC
  Administered 2015-01-05 – 2015-01-07 (×4): 3 mL via INTRAVENOUS

## 2015-01-05 NOTE — ED Notes (Signed)
Pt oxygen 86-88% on RA. Pt placed on 2L and oxygen increased to 93-95%

## 2015-01-05 NOTE — ED Notes (Addendum)
Pt oxygen increase to 92% on 3L of oxygen.

## 2015-01-05 NOTE — ED Notes (Signed)
Pt had a fall yesterday stating he fell over a flip flop. Per pt he fell on a heater. Pt has previous history of fall with punctured lung. Pt is having no distress at this time. Pt fell yesterday and was down until today. Pt lives alone with no assistance.   VS stable per EMS, CBG 140.

## 2015-01-05 NOTE — ED Notes (Signed)
MD at bedside. 

## 2015-01-05 NOTE — ED Provider Notes (Signed)
CSN: [id number]     Arrival date & time 01/05/15  1152 History  By signing my name below, I, Stephania Fragmin, attest that this documentation has been prepared under the direction and in the presence of Orlie Dakin, MD. Electronically Signed: Stephania Fragmin, ED Scribe. 01/05/2015. 12:42 PM.    Chief Complaint  Patient presents with  . Fall  . Failure To Thrive   The history is provided by the patient. No language interpreter was used.    HPI Comments: Jeffrey Sweeney is a 69 y.o. male with a history of CAD, CVA, seizures, HTN, and depression, who presents to the Emergency Department brought in by ambulance S/P 2 falls that have occurred over the past 2 days. Last fall occurred yesterday .  Patient normally ambulates with a walker but was not using it when he fell. He complains of moderate nonradiating low back pain and left anterior chest pain since the fall. Both back pain and chest pain are worse with changing positions or pressing on the area.Marland Kitchen He reports he felt fine before he fell. Patient states he called 911.  Patient does reports 2 weeks of SOB and has had a cough productive with white sputum, as well as diarrhea that last occurred 3 weeks ago. Patient reports a history of smoking but quit 5 years ago. He denies a history of EtOH consumption or illicit drug use. Patient lives with his son, who is currently at work. No modifying factors were noted. He did not try any treatments PTA. He denies any other fecal symptoms, urinary symptoms, or fevers. Patient has known allergies to demerol, which causes nausea.   PCP: Dr. Luan Pulling  Past Medical History  Diagnosis Date  . Heart disease   . CAD (coronary artery disease)   . Stroke [facility])     left arm weakness  . Seizures ([place])   . Depression   . Hypertension    Past Surgical History  Procedure Laterality Date  . Ankle fracture surgery     Family History  Problem Relation Age of Onset  . Cancer Mother   . Cancer Father    Social History   Substance Use Topics  . Smoking status: Current Every Day Smoker -- 0.50 packs/day for 50 years    Types: Cigarettes  . Smokeless tobacco: Never Used  . Alcohol Use: No    Review of Systems  Constitutional: Negative.   HENT: Negative.   Respiratory: Positive for cough and shortness of breath.   Cardiovascular: Positive for chest pain.  Gastrointestinal: Positive for diarrhea (3 weeks ago). Negative for constipation and anal bleeding.  Genitourinary: Negative for dysuria, urgency, frequency, hematuria and difficulty urinating.  Musculoskeletal: Positive for back pain.  Skin: Positive for color change (bruising to left rib cage, left buttocks, left arm).  Neurological: Negative.   Psychiatric/Behavioral: Negative.   All other systems reviewed and are negative.     Allergies  Meperidine hcl  Home Medications   Prior to Admission medications   Medication Sig Start Date End Date Taking? Authorizing Provider  albuterol (PROVENTIL HFA) 108 (90 BASE) MCG/ACT inhaler Inhale 2 puffs into the lungs every 6 (six) hours as needed for wheezing or shortness of breath.   Yes Historical Provider, MD  ALPRAZolam Duanne Moron) 1 MG tablet Take 1 mg by mouth 3 (three) times daily as needed. For anxiety   Yes Historical Provider, MD  amLODipine (NORVASC) 5 MG tablet Take 5 mg by mouth daily.   Yes Historical Provider, MD  atorvastatin (LIPITOR) 40 MG tablet Take 40 mg by mouth at bedtime.    Yes Historical Provider, MD  levETIRAcetam (KEPPRA) 1000 MG tablet Take 1,000 mg by mouth 3 (three) times daily.   Yes Historical Provider, MD  nitroGLYCERIN (NITROSTAT) 0.4 MG SL tablet Place 0.4 mg under the tongue every 5 (five) minutes as needed. Chest pain   Yes Historical Provider, MD  pantoprazole (PROTONIX) 40 MG tablet Take 40 mg by mouth daily.     Yes Historical Provider, MD  potassium chloride SA (K-DUR,KLOR-CON) 20 MEQ tablet Take 20 mEq by mouth 2 (two) times daily.   Yes Historical Provider, MD   traMADol (ULTRAM) 50 MG tablet Take 50 mg by mouth 4 (four) times daily.   Yes Historical Provider, MD  Valproic Acid (DEPAKENE) 250 MG/5ML SYRP syrup Take 250 mg by mouth 2 (two) times daily.    Yes Historical Provider, MD   BP 135/107 mmHg  Pulse 78  Temp(Src) 98.2 F (36.8 C) (Oral)  Resp 12  Ht 5\' 8"  (1.727 m)  Wt 140 lb (63.504 kg)  BMI 21.29 kg/m2  SpO2 85% Physical Exam  Constitutional:  Chronically ill-appearing  HENT:  Head: Normocephalic and atraumatic.  Poor dentition, mucous membranes dry  Eyes: Conjunctivae are normal. Pupils are equal, round, and reactive to light.  Neck: Neck supple. No tracheal deviation present. No thyromegaly present.  Cardiovascular: Normal rate and regular rhythm.   No murmur heard. Pulmonary/Chest: Effort normal.  Left chest tender anteriorly. No crepitance or flail  Abdominal: Soft. Bowel sounds are normal. He exhibits no distension. There is no tenderness.  Genitourinary: Rectum normal. Guaiac negative stool.  Rectum normal tone and brown stool Hemoccult negative  Musculoskeletal: Normal range of motion. He exhibits no edema or tenderness.  Elvis non-stable. Entire spine nontender. All 4 extremities without deformity or swelling, neurovascularly intact  Neurological: He is alert. Coordination normal.  Skin: Skin is warm and dry. No rash noted.  Old appearing brownish ecchymosis proximally 5 cm in diameter over left deltoid area. There is also a brownish ecchymosis approximate 5 cm in diameter over left lateral rib cage, mid axillary line and similar old appearing brownish ecchymosis over left hip.  Psychiatric: He has a normal mood and affect.  Nursing note and vitals reviewed.   ED Course  Procedures (including critical care time)  DIAGNOSTIC STUDIES: Oxygen Saturation is 85% on RA, low by my interpretation.    COORDINATION OF CARE: 12:42 PM - Discussed treatment plan with pt at bedside. Pt verbalized understanding and agreed to  plan.   Labs Review Labs Reviewed - No data to display  Imaging Review No results found. I have personally reviewed and evaluated these images and lab results as part of my medical decision-making.   EKG Interpretation   Date/Time:  Thursday January 05 2015 13:05:45 EST Ventricular Rate:  68 PR Interval:  146 QRS Duration: 104 QT Interval:  390 QTC Calculation: 415 R Axis:   80 Text Interpretation:  Sinus rhythm Atrial premature complexes Borderline T  wave abnormalities No significant change since last tracing Confirmed by  Winfred Leeds  MD, Katonya Blecher [id number]) on 01/05/2015 1:09:43 PM      x-rays viewed by me. Patient's pulse oximetry desaturates when left on room air. Results for orders placed or performed during the hospital encounter of 01/05/15  CBC with Differential/Platelet  Result Value Ref Range   WBC 9.3 4.0 - 10.5 K/uL   RBC 4.54 4.22 - 5.81 MIL/uL   Hemoglobin  14.9 13.0 - 17.0 g/dL   HCT 44.9 39.0 - 52.0 %   MCV 98.9 78.0 - 100.0 fL   MCH 32.8 26.0 - 34.0 pg   MCHC 33.2 30.0 - 36.0 g/dL   RDW 14.4 11.5 - 15.5 %   Platelets 241 150 - 400 K/uL   Neutrophils Relative % 74 %   Neutro Abs 6.8 1.7 - 7.7 K/uL   Lymphocytes Relative 15 %   Lymphs Abs 1.4 0.7 - 4.0 K/uL   Monocytes Relative 9 %   Monocytes Absolute 0.8 0.1 - 1.0 K/uL   Eosinophils Relative 2 %   Eosinophils Absolute 0.2 0.0 - 0.7 K/uL   Basophils Relative 0 %   Basophils Absolute 0.0 0.0 - 0.1 K/uL  Basic metabolic panel  Result Value Ref Range   Sodium 135 135 - 145 mmol/L   Potassium 3.8 3.5 - 5.1 mmol/L   Chloride 96 (L) 101 - 111 mmol/L   CO2 32 22 - 32 mmol/L   Glucose, Bld 103 (H) 65 - 99 mg/dL   BUN 13 6 - 20 mg/dL   Creatinine, Ser 0.92 0.61 - 1.24 mg/dL   Calcium 8.8 (L) 8.9 - 10.3 mg/dL   GFR calc non Af Amer >60 >60 mL/min   GFR calc Af Amer >60 >60 mL/min   Anion gap 7 5 - 15  Urinalysis, Routine w reflex microscopic (not at [facility])  Result Value Ref Range   Color, Urine AMBER (A)  YELLOW   APPearance CLEAR CLEAR   Specific Gravity, Urine 1.025 1.005 - 1.030   pH 5.5 5.0 - 8.0   Glucose, UA NEGATIVE NEGATIVE mg/dL   Hgb urine dipstick NEGATIVE NEGATIVE   Bilirubin Urine MODERATE (A) NEGATIVE   Ketones, ur 15 (A) NEGATIVE mg/dL   Protein, ur NEGATIVE NEGATIVE mg/dL   Nitrite NEGATIVE NEGATIVE   Leukocytes, UA NEGATIVE NEGATIVE  Blood gas, arterial  Result Value Ref Range   FIO2 0.21    pH, Arterial 7.450 7.350 - 7.450   pCO2 arterial 42.7 35.0 - 45.0 mmHg   pO2, Arterial 62.0 (L) 80.0 - 100.0 mmHg   Bicarbonate 28.8 (H) 20.0 - 24.0 mEq/L   Acid-Base Excess 5.3 (H) 0.0 - 2.0 mmol/L   O2 Saturation 90.1 %   Patient temperature 37.0    Collection site RIGHT RADIAL    Drawn by [id number]    Sample type ARTERIAL DRAW    Allens test (pass/fail) PASS PASS  Troponin I  Result Value Ref Range   Troponin I <0.03 <0.031 ng/mL  CK  Result Value Ref Range   Total CK 201 49 - 397 U/L  POC occult blood, ED Provider will collect  Result Value Ref Range   Fecal Occult Bld NEGATIVE NEGATIVE  I-Stat CG4 Lactic Acid, ED  Result Value Ref Range   Lactic Acid, Venous 1.24 0.5 - 2.0 mmol/L   Dg Chest 1 View  12/31/2014  CLINICAL DATA:  Fall. EXAM: CHEST 1 VIEW COMPARISON:  12/16/2012 FINDINGS: Mild cardiac enlargement. No pleural effusion or edema. No airspace consolidation. Left posterior rib fracture deformities are again noted, chronic. IMPRESSION: 1. No acute cardiopulmonary abnormalities. 2. Chronic left posterior rib fractures. Electronically Signed   By: Kerby Moors M.D.   On: 12/31/2014 10:52   Dg Chest 2 View  01/05/2015  CLINICAL DATA:  Two falls in 2 days. Left chest pain, landing on chest. History of rib fractures. Shortness of breath and productive cough for 2 weeks. EXAM: CHEST  2 VIEW COMPARISON:  12/31/2014 FINDINGS: Mild elevation of the right hemidiaphragm. Mild cardiomegaly. No confluent airspace opacities or effusions. Mild cardiomegaly. Old left rib  fractures are again noted, stable. No visible acute rib fractures. No pneumothorax. IMPRESSION: Old left rib fractures.  No visible new/ acute rib fractures. Mild cardiomegaly. Electronically Signed   By: Rolm Baptise M.D.   On: 01/05/2015 15:30   Dg Lumbar Spine Complete  01/05/2015  CLINICAL DATA:  Pain following recent falls EXAM: LUMBAR SPINE - COMPLETE 4+ VIEW COMPARISON:  December 31, 2014 FINDINGS: Frontal, lateral, spot lumbosacral lateral, and bilateral oblique views were obtained. There are 5 non-rib-bearing lumbar type vertebral bodies. There is lumbar dextroscoliosis. Anterior wedge compression fractures of L1, L2, and L3 are stable in appearance. There is also mild apparent concavity along the superior endplates of 624THL and 624THL, stable and likely due in part to the scoliosis. There is no new fracture. No spondylolisthesis. There is mild disc space narrowing at L4-5 and L5-S1 as well as at L1-2, stable. There is facet osteoarthritic change at L4-5 and L5-S1 bilaterally. IMPRESSION: Anterior wedge compression fractures at L1, L2, and L3 are stable compared to recent prior study. No new fracture. No spondylolisthesis. Stable scoliosis and areas of arthropathy. Electronically Signed   By: Lowella Grip III M.D.   On: 01/05/2015 15:13   Dg Lumbar Spine Complete  12/31/2014  CLINICAL DATA:  Status post fall at home with pain of lower back. EXAM: LUMBAR SPINE - COMPLETE 4+ VIEW COMPARISON:  CT abdomen pelvis August 07, 2011 FINDINGS: There is 50% compression deformity of L1. There is 20% compression deformity of L2 and L3. No definite cortical discontinuity is identified in L1, L2 and L3. Degenerative joint changes are identified throughout lumbar spine. There is no dislocation. There is chronic posttraumatic change of the left tenth rib present on prior CT of 2013. IMPRESSION: Compression deformities of L1 through L3, probably chronic. There is no dislocation. Electronically Signed   By: Abelardo Diesel M.D.   On: 12/31/2014 10:51   Ct Head Wo Contrast  12/31/2014  CLINICAL DATA:  Trauma. Fall. Hit head 1 hour ago. Loss of consciousness and laceration to posterior head. EXAM: CT HEAD WITHOUT CONTRAST CT CERVICAL SPINE WITHOUT CONTRAST TECHNIQUE: Multidetector CT imaging of the head and cervical spine was performed following the standard protocol without intravenous contrast. Multiplanar CT image reconstructions of the cervical spine were also generated. COMPARISON:  08/07/2011 FINDINGS: CT HEAD FINDINGS Encephalomalacia involving the right cerebral hemisphere is again noted and appears unchanged from previous exam compatible with chronic MCA infarct. Prominence of the sulci and ventricles noted consistent with brain atrophy. No evidence for acute brain infarct, acute intracranial hemorrhage or mass. Chronic opacification of the left maxillary sinus identified. The mastoid air cells are clear. The the calvarium is intact. Posterior scout laceration and hematoma identified CT CERVICAL SPINE FINDINGS Normal alignment of the cervical spine. The vertebral body heights are well preserved. Disc space narrowing and ventral endplate spurring is noted. Most advanced at C5-6 and C6-7. The facet joints are all well aligned. The prevertebral soft tissue space appears normal. No fractures or subluxations identified. IMPRESSION: 1. No acute intracranial abnormalities. 2. Chronic right MCA infarct. 3. Chronic left maxillary sinus disease. 4. Posterior scalp laceration 5. No evidence for cervical spine fracture. 6. Cervical degenerative disc disease noted. Electronically Signed   By: Kerby Moors M.D.   On: 12/31/2014 11:12   Ct Cervical Spine Wo Contrast  12/31/2014  CLINICAL DATA:  Trauma. Fall. Hit head 1 hour ago. Loss of consciousness and laceration to posterior head. EXAM: CT HEAD WITHOUT CONTRAST CT CERVICAL SPINE WITHOUT CONTRAST TECHNIQUE: Multidetector CT imaging of the head and cervical spine was performed  following the standard protocol without intravenous contrast. Multiplanar CT image reconstructions of the cervical spine were also generated. COMPARISON:  08/07/2011 FINDINGS: CT HEAD FINDINGS Encephalomalacia involving the right cerebral hemisphere is again noted and appears unchanged from previous exam compatible with chronic MCA infarct. Prominence of the sulci and ventricles noted consistent with brain atrophy. No evidence for acute brain infarct, acute intracranial hemorrhage or mass. Chronic opacification of the left maxillary sinus identified. The mastoid air cells are clear. The the calvarium is intact. Posterior scout laceration and hematoma identified CT CERVICAL SPINE FINDINGS Normal alignment of the cervical spine. The vertebral body heights are well preserved. Disc space narrowing and ventral endplate spurring is noted. Most advanced at C5-6 and C6-7. The facet joints are all well aligned. The prevertebral soft tissue space appears normal. No fractures or subluxations identified. IMPRESSION: 1. No acute intracranial abnormalities. 2. Chronic right MCA infarct. 3. Chronic left maxillary sinus disease. 4. Posterior scalp laceration 5. No evidence for cervical spine fracture. 6. Cervical degenerative disc disease noted. Electronically Signed   By: Kerby Moors M.D.   On: 12/31/2014 11:12    MDM  In light of recurrent falls, hypoxemia. I've consulted Dr Anastasio Champion who will arrange for inpatient stay. I suspect the patient has underlying COPD in light of prolonged smoking history Final diagnoses:  None   diagnosis #1 recurrent falls #2 hypoxia #3 back pain #4 tobaccoabuse  I personally performed the services described in this documentation, which was scribed in my presence. The recorded information has been reviewed and considered.      Orlie Dakin, MD 01/05/15 [phone]

## 2015-01-05 NOTE — ED Notes (Signed)
Pt 91 % on RA, MD Jacubowitz made aware

## 2015-01-05 NOTE — ED Notes (Signed)
Pt has bruising from previous fall, bruising is noted to his left rib cage, left buttocks, left arm

## 2015-01-05 NOTE — H&P (Signed)
[facility] History and Physical  Jeffrey Sweeney [id number] DOB: 1945/01/23 DOA: 01/05/2015  Referring physician: ER PCP: Alonza Bogus, MD   Chief Complaint: Dyspnea. Falls.  HPI: Jeffrey Sweeney is a 69 y.o. male  This is a 69 year old man, ex-smoker, who is at a 2 week history of dyspnea associated with a cough productive of white sputum has had a couple of falls in the last 2 days. Investigations in the emergency room a couple of days ago did not show any broken bones. He now presents again with another fall yesterday. He came in today complaining of low back pain and left anterior chest pain since the fall. X-rays do not show fractures. He was found to be hypoxic in the ER. He denies any hemoptysis. Is now being admitted for further investigation and management.   Review of Systems:  Apart from symptoms above, all systems are negative.   Past Medical History  Diagnosis Date  . Heart disease   . CAD (coronary artery disease)   . Stroke [facility])     left arm weakness  . Seizures ([place])   . Depression   . Hypertension    Past Surgical History  Procedure Laterality Date  . Ankle fracture surgery     Social History:  reports that he has been smoking Cigarettes.  He has a 25 pack-year smoking history. He has never used smokeless tobacco. He reports that he does not drink alcohol or use illicit drugs.  Allergies  Allergen Reactions  . Meperidine Hcl Nausea And Vomiting    Family History  Problem Relation Age of Onset  . Cancer Mother   . Cancer Father       Prior to Admission medications   Medication Sig Start Date End Date Taking? Authorizing Provider  albuterol (PROVENTIL HFA) 108 (90 BASE) MCG/ACT inhaler Inhale 2 puffs into the lungs every 6 (six) hours as needed for wheezing or shortness of breath.   Yes Historical Provider, MD  ALPRAZolam Duanne Moron) 1 MG tablet Take 1 mg by mouth 3 (three) times daily as needed. For anxiety   Yes Historical Provider, MD    amLODipine (NORVASC) 5 MG tablet Take 5 mg by mouth daily.   Yes Historical Provider, MD  atorvastatin (LIPITOR) 40 MG tablet Take 40 mg by mouth at bedtime.    Yes Historical Provider, MD  levETIRAcetam (KEPPRA) 1000 MG tablet Take 1,000 mg by mouth 3 (three) times daily.   Yes Historical Provider, MD  nitroGLYCERIN (NITROSTAT) 0.4 MG SL tablet Place 0.4 mg under the tongue every 5 (five) minutes as needed. Chest pain   Yes Historical Provider, MD  pantoprazole (PROTONIX) 40 MG tablet Take 40 mg by mouth daily.     Yes Historical Provider, MD  potassium chloride SA (K-DUR,KLOR-CON) 20 MEQ tablet Take 20 mEq by mouth 2 (two) times daily.   Yes Historical Provider, MD  traMADol (ULTRAM) 50 MG tablet Take 50 mg by mouth 4 (four) times daily.   Yes Historical Provider, MD  Valproic Acid (DEPAKENE) 250 MG/5ML SYRP syrup Take 250 mg by mouth 2 (two) times daily.    Yes Historical Provider, MD   Physical Exam: Filed Vitals:   01/05/15 1243 01/05/15 1330 01/05/15 1500 01/05/15 1530  BP:  141/51 131/75 125/71  Pulse:  71  82  Temp: 100 F (37.8 C)     TempSrc: Rectal     Resp:  16 20 22   Height:      Weight:  SpO2:  98%  92%    Wt Readings from Last 3 Encounters:  01/05/15 63.504 kg (140 lb)  12/31/14 90.719 kg (200 lb)  06/05/13 96.163 kg (212 lb)    General:  Appears calm and comfortable. He does not appear to be dyspneic at rest. There is no peripheral or central cyanosis. Eyes: PERRL, normal lids, irises & conjunctiva ENT: grossly normal hearing, lips & tongue Neck: no LAD, masses or thyromegaly Cardiovascular: RRR, no m/r/g. No LE edema. Telemetry: SR, no arrhythmias  Respiratory: Bilateral wheezing. There are no crackles or pleural rub. There is no bronchial breathing. There is left anterior chest wall slight tenderness. Abdomen: soft, ntnd Skin: no rash or induration seen on limited exam Musculoskeletal: grossly normal tone BUE/BLE Psychiatric: grossly normal mood and  affect, speech fluent and appropriate Neurologic: grossly non-focal.          Labs on Admission:  Basic Metabolic Panel:  Recent Labs Lab 01/05/15 1300  NA 135  K 3.8  CL 96*  CO2 32  GLUCOSE 103*  BUN 13  CREATININE 0.92  CALCIUM 8.8*   Liver Function Tests: No results for input(s): AST, ALT, ALKPHOS, BILITOT, PROT, ALBUMIN in the last 168 hours. No results for input(s): LIPASE, AMYLASE in the last 168 hours. No results for input(s): AMMONIA in the last 168 hours. CBC:  Recent Labs Lab 01/05/15 1300  WBC 9.3  NEUTROABS 6.8  HGB 14.9  HCT 44.9  MCV 98.9  PLT 241   Cardiac Enzymes:  Recent Labs Lab 01/05/15 1300  CKTOTAL 201  TROPONINI <0.03    BNP (last 3 results) No results for input(s): BNP in the last 8760 hours.  ProBNP (last 3 results) No results for input(s): PROBNP in the last 8760 hours.  CBG: No results for input(s): GLUCAP in the last 168 hours.  Radiological Exams on Admission: Dg Chest 2 View  01/05/2015  CLINICAL DATA:  Two falls in 2 days. Left chest pain, landing on chest. History of rib fractures. Shortness of breath and productive cough for 2 weeks. EXAM: CHEST  2 VIEW COMPARISON:  12/31/2014 FINDINGS: Mild elevation of the right hemidiaphragm. Mild cardiomegaly. No confluent airspace opacities or effusions. Mild cardiomegaly. Old left rib fractures are again noted, stable. No visible acute rib fractures. No pneumothorax. IMPRESSION: Old left rib fractures.  No visible new/ acute rib fractures. Mild cardiomegaly. Electronically Signed   By: Rolm Baptise M.D.   On: 01/05/2015 15:30   Dg Lumbar Spine Complete  01/05/2015  CLINICAL DATA:  Pain following recent falls EXAM: LUMBAR SPINE - COMPLETE 4+ VIEW COMPARISON:  December 31, 2014 FINDINGS: Frontal, lateral, spot lumbosacral lateral, and bilateral oblique views were obtained. There are 5 non-rib-bearing lumbar type vertebral bodies. There is lumbar dextroscoliosis. Anterior wedge  compression fractures of L1, L2, and L3 are stable in appearance. There is also mild apparent concavity along the superior endplates of 624THL and 624THL, stable and likely due in part to the scoliosis. There is no new fracture. No spondylolisthesis. There is mild disc space narrowing at L4-5 and L5-S1 as well as at L1-2, stable. There is facet osteoarthritic change at L4-5 and L5-S1 bilaterally. IMPRESSION: Anterior wedge compression fractures at L1, L2, and L3 are stable compared to recent prior study. No new fracture. No spondylolisthesis. Stable scoliosis and areas of arthropathy. Electronically Signed   By: Lowella Grip III M.D.   On: 01/05/2015 15:13    EKG: Independently reviewed. Sinus rhythm without any acute ST-T wave  changes.  Assessment/Plan   1. Exacerbation of COPD. There does not to be an infective element. He'll be treated with IV steroids. 2. Falls. These seem to be accidental by his history. There are no focal neurological signs. 3. Elevated d-dimer. Pulmonary embolism is a consideration in view of the hypoxia. I will order CT chest angiogram. 4. Hypertension. Stable.  He will be admitted to telemetry. Further recommendations will depend on patient's hospital progress.   Code Status: Full code.   DVT Prophylaxis: Lovenox.  Family Communication: I discussed the plan with the patient at the bedside.  Disposition Plan: Home when medically stable  Time spent: 60 minutes.  Doree Albee [facility] Pager [phone].

## 2015-01-06 LAB — COMPREHENSIVE METABOLIC PANEL
ALT: 26 U/L (ref 17–63)
ANION GAP: 9 (ref 5–15)
AST: 46 U/L — ABNORMAL HIGH (ref 15–41)
Albumin: 3.4 g/dL — ABNORMAL LOW (ref 3.5–5.0)
Alkaline Phosphatase: 57 U/L (ref 38–126)
BUN: 15 mg/dL (ref 6–20)
CHLORIDE: 99 mmol/L — AB (ref 101–111)
CO2: 29 mmol/L (ref 22–32)
Calcium: 8.9 mg/dL (ref 8.9–10.3)
Creatinine, Ser: 0.73 mg/dL (ref 0.61–1.24)
GFR calc non Af Amer: >60 mL/min (ref >60)
Glucose, Bld: 148 mg/dL — ABNORMAL HIGH (ref 65–99)
Potassium: 4.3 mmol/L (ref 3.5–5.1)
SODIUM: 137 mmol/L (ref 135–145)
Total Bilirubin: 1.2 mg/dL (ref 0.3–1.2)
Total Protein: 7 g/dL (ref 6.5–8.1)

## 2015-01-06 LAB — CBC
HEMATOCRIT: 43.7 % (ref 39.0–52.0)
HEMOGLOBIN: 14.8 g/dL (ref 13.0–17.0)
MCH: 33.6 pg (ref 26.0–34.0)
MCHC: 33.9 g/dL (ref 30.0–36.0)
MCV: 99.1 fL (ref 78.0–100.0)
Platelets: 266 10*3/uL (ref 150–400)
RBC: 4.41 MIL/uL (ref 4.22–5.81)
RDW: 14.4 % (ref 11.5–15.5)
WBC: 5.9 10*3/uL (ref 4.0–10.5)

## 2015-01-06 MED ORDER — MIDODRINE HCL 5 MG PO TABS
5.0000 mg | ORAL_TABLET | Freq: Three times a day (TID) | ORAL | Status: DC
  Administered 2015-01-06 – 2015-01-07 (×4): 5 mg via ORAL
  Filled 2015-01-06 (×4): qty 1

## 2015-01-06 MED ORDER — ALBUTEROL SULFATE (2.5 MG/3ML) 0.083% IN NEBU
3.0000 mL | INHALATION_SOLUTION | RESPIRATORY_TRACT | Status: DC
  Administered 2015-01-06 – 2015-01-07 (×6): 3 mL via RESPIRATORY_TRACT
  Filled 2015-01-06 (×5): qty 3

## 2015-01-06 NOTE — Progress Notes (Signed)
Subjective: He was admitted with COPD exacerbation. He also has had multiple falls. He has seizure disorder but no seizures have been noted. He has severe orthostasis at baseline and had been on midodrine but is not on that now . I don't think this was intentionally discontinued. He was hypoxic. He says he feels a little better  Objective: Vital signs in last 24 hours: Temp:  [98 F (36.7 C)-100 F (37.8 C)] 98 F (36.7 C) (12/30 0355) Pulse Rate:  [44-87] 65 (12/30 0849) Resp:  [12-22] 16 (12/30 0849) BP: (116-142)/(51-107) 131/81 mmHg (12/30 0355) SpO2:  [85 %-98 %] 92 % (12/30 0849) Weight:  [63.504 kg (140 lb)] 63.504 kg (140 lb) (12/29 1152) Weight change:  Last BM Date: 01/05/15  Intake/Output from previous day: 12/29 0701 - 12/30 0700 In: -  Out: 475 [Urine:475]  PHYSICAL EXAM General appearance: alert, cooperative, mild distress and He looks acutely sick Resp: rhonchi bilaterally and wheezes bilaterally Cardio: regular rate and rhythm, S1, S2 normal, no murmur, click, rub or gallop GI: soft, non-tender; bowel sounds normal; no masses,  no organomegaly Extremities: extremities normal, atraumatic, no cyanosis or edema  Lab Results:  Results for orders placed or performed during the hospital encounter of 01/05/15 (from the past 48 hour(s))  CBC with Differential/Platelet     Status: None   Collection Time: 01/05/15  1:00 PM  Result Value Ref Range   WBC 9.3 4.0 - 10.5 K/uL   RBC 4.54 4.22 - 5.81 MIL/uL   Hemoglobin 14.9 13.0 - 17.0 g/dL   HCT 44.9 39.0 - 52.0 %   MCV 98.9 78.0 - 100.0 fL   MCH 32.8 26.0 - 34.0 pg   MCHC 33.2 30.0 - 36.0 g/dL   RDW 14.4 11.5 - 15.5 %   Platelets 241 150 - 400 K/uL   Neutrophils Relative % 74 %   Neutro Abs 6.8 1.7 - 7.7 K/uL   Lymphocytes Relative 15 %   Lymphs Abs 1.4 0.7 - 4.0 K/uL   Monocytes Relative 9 %   Monocytes Absolute 0.8 0.1 - 1.0 K/uL   Eosinophils Relative 2 %   Eosinophils Absolute 0.2 0.0 - 0.7 K/uL   Basophils  Relative 0 %   Basophils Absolute 0.0 0.0 - 0.1 K/uL  Basic metabolic panel     Status: Abnormal   Collection Time: 01/05/15  1:00 PM  Result Value Ref Range   Sodium 135 135 - 145 mmol/L   Potassium 3.8 3.5 - 5.1 mmol/L   Chloride 96 (L) 101 - 111 mmol/L   CO2 32 22 - 32 mmol/L   Glucose, Bld 103 (H) 65 - 99 mg/dL   BUN 13 6 - 20 mg/dL   Creatinine, Ser 0.92 0.61 - 1.24 mg/dL   Calcium 8.8 (L) 8.9 - 10.3 mg/dL   GFR calc non Af Amer >60 >60 mL/min   GFR calc Af Amer >60 >60 mL/min    Comment: (NOTE) The eGFR has been calculated using the CKD EPI equation. This calculation has not been validated in all clinical situations. eGFR's persistently <60 mL/min signify possible Chronic Kidney Disease.    Anion gap 7 5 - 15  Troponin I     Status: None   Collection Time: 01/05/15  1:00 PM  Result Value Ref Range   Troponin I <0.03 <0.031 ng/mL    Comment:        NO INDICATION OF MYOCARDIAL INJURY.   CK     Status: None  Collection Time: 01/05/15  1:00 PM  Result Value Ref Range   Total CK 201 49 - 397 U/L  Blood gas, arterial     Status: Abnormal   Collection Time: 01/05/15  1:05 PM  Result Value Ref Range   FIO2 0.21    pH, Arterial 7.450 7.350 - 7.450   pCO2 arterial 42.7 35.0 - 45.0 mmHg   pO2, Arterial 62.0 (L) 80.0 - 100.0 mmHg   Bicarbonate 28.8 (H) 20.0 - 24.0 mEq/L   Acid-Base Excess 5.3 (H) 0.0 - 2.0 mmol/L   O2 Saturation 90.1 %   Patient temperature 37.0    Collection site RIGHT RADIAL    Drawn by [id number]    Sample type ARTERIAL DRAW    Allens test (pass/fail) PASS PASS  D-dimer, quantitative (not at [facility])     Status: Abnormal   Collection Time: 01/05/15  1:05 PM  Result Value Ref Range   D-Dimer, Quant 3.62 (H) 0.00 - 0.50 ug/mL-FEU    Comment: (NOTE) At the manufacturer cut-off of 0.50 ug/mL FEU, this assay has been documented to exclude PE with a sensitivity and negative predictive value of 97 to 99%.  At this time, this assay has not been approved by the  FDA to exclude DVT/VTE. Results should be correlated with clinical presentation.   I-Stat CG4 Lactic Acid, ED     Status: None   Collection Time: 01/05/15  1:11 PM  Result Value Ref Range   Lactic Acid, Venous 1.24 0.5 - 2.0 mmol/L  POC occult blood, ED Provider will collect     Status: None   Collection Time: 01/05/15  1:32 PM  Result Value Ref Range   Fecal Occult Bld NEGATIVE NEGATIVE  Urinalysis, Routine w reflex microscopic (not at [facility])     Status: Abnormal   Collection Time: 01/05/15  2:40 PM  Result Value Ref Range   Color, Urine AMBER (A) YELLOW    Comment: BIOCHEMICALS MAY BE AFFECTED BY COLOR   APPearance CLEAR CLEAR   Specific Gravity, Urine 1.025 1.005 - 1.030   pH 5.5 5.0 - 8.0   Glucose, UA NEGATIVE NEGATIVE mg/dL   Hgb urine dipstick NEGATIVE NEGATIVE   Bilirubin Urine MODERATE (A) NEGATIVE   Ketones, ur 15 (A) NEGATIVE mg/dL   Protein, ur NEGATIVE NEGATIVE mg/dL   Nitrite NEGATIVE NEGATIVE   Leukocytes, UA NEGATIVE NEGATIVE    Comment: MICROSCOPIC NOT DONE ON URINES WITH NEGATIVE PROTEIN, BLOOD, LEUKOCYTES, NITRITE, OR GLUCOSE <1000 mg/dL.  Comprehensive metabolic panel     Status: Abnormal   Collection Time: 01/06/15  5:52 AM  Result Value Ref Range   Sodium 137 135 - 145 mmol/L   Potassium 4.3 3.5 - 5.1 mmol/L   Chloride 99 (L) 101 - 111 mmol/L   CO2 29 22 - 32 mmol/L   Glucose, Bld 148 (H) 65 - 99 mg/dL   BUN 15 6 - 20 mg/dL   Creatinine, Ser 0.73 0.61 - 1.24 mg/dL   Calcium 8.9 8.9 - 10.3 mg/dL   Total Protein 7.0 6.5 - 8.1 g/dL   Albumin 3.4 (L) 3.5 - 5.0 g/dL   AST 46 (H) 15 - 41 U/L   ALT 26 17 - 63 U/L   Alkaline Phosphatase 57 38 - 126 U/L   Total Bilirubin 1.2 0.3 - 1.2 mg/dL   GFR calc non Af Amer >60 >60 mL/min   GFR calc Af Amer >60 >60 mL/min    Comment: (NOTE) The eGFR has been calculated using  the CKD EPI equation. This calculation has not been validated in all clinical situations. eGFR's persistently <60 mL/min signify possible  Chronic Kidney Disease.    Anion gap 9 5 - 15  CBC     Status: None   Collection Time: 01/06/15  5:52 AM  Result Value Ref Range   WBC 5.9 4.0 - 10.5 K/uL   RBC 4.41 4.22 - 5.81 MIL/uL   Hemoglobin 14.8 13.0 - 17.0 g/dL   HCT 43.7 39.0 - 52.0 %   MCV 99.1 78.0 - 100.0 fL   MCH 33.6 26.0 - 34.0 pg   MCHC 33.9 30.0 - 36.0 g/dL   RDW 14.4 11.5 - 15.5 %   Platelets 266 150 - 400 K/uL    ABGS  Recent Labs  01/05/15 1305  PHART 7.450  PO2ART 62.0*  HCO3 28.8*   CULTURES No results found for this or any previous visit (from the past 240 hour(s)). Studies/Results: Dg Chest 2 View  01/05/2015  CLINICAL DATA:  Two falls in 2 days. Left chest pain, landing on chest. History of rib fractures. Shortness of breath and productive cough for 2 weeks. EXAM: CHEST  2 VIEW COMPARISON:  12/31/2014 FINDINGS: Mild elevation of the right hemidiaphragm. Mild cardiomegaly. No confluent airspace opacities or effusions. Mild cardiomegaly. Old left rib fractures are again noted, stable. No visible acute rib fractures. No pneumothorax. IMPRESSION: Old left rib fractures.  No visible new/ acute rib fractures. Mild cardiomegaly. Electronically Signed   By: Rolm Baptise M.D.   On: 01/05/2015 15:30   Dg Lumbar Spine Complete  01/05/2015  CLINICAL DATA:  Pain following recent falls EXAM: LUMBAR SPINE - COMPLETE 4+ VIEW COMPARISON:  December 31, 2014 FINDINGS: Frontal, lateral, spot lumbosacral lateral, and bilateral oblique views were obtained. There are 5 non-rib-bearing lumbar type vertebral bodies. There is lumbar dextroscoliosis. Anterior wedge compression fractures of L1, L2, and L3 are stable in appearance. There is also mild apparent concavity along the superior endplates of K09 and F81, stable and likely due in part to the scoliosis. There is no new fracture. No spondylolisthesis. There is mild disc space narrowing at L4-5 and L5-S1 as well as at L1-2, stable. There is facet osteoarthritic change at L4-5  and L5-S1 bilaterally. IMPRESSION: Anterior wedge compression fractures at L1, L2, and L3 are stable compared to recent prior study. No new fracture. No spondylolisthesis. Stable scoliosis and areas of arthropathy. Electronically Signed   By: Lowella Grip III M.D.   On: 01/05/2015 15:13   Ct Angio Chest Pe W/cm &/or Wo Cm  01/05/2015  CLINICAL DATA:  Initial encounter for 2 week history of dyspnea and left anterior chest pain with hypoxia. Increased D-dimer. EXAM: CT ANGIOGRAPHY CHEST WITH CONTRAST TECHNIQUE: Multidetector CT imaging of the chest was performed using the standard protocol during bolus administration of intravenous contrast. Multiplanar CT image reconstructions and MIPs were obtained to evaluate the vascular anatomy. CONTRAST:  179m OMNIPAQUE IOHEXOL 350 MG/ML SOLN COMPARISON:  08/15/2011. FINDINGS: Mediastinum / Lymph Nodes: There is no axillary lymphadenopathy. Scattered small lymph nodes in the mediastinum do not meet CT criteria for pathologic enlargement. There is no hilar lymphadenopathy. The esophagus has normal imaging features. The heart size is normal. No pericardial effusion. Coronary artery calcification is noted. No filling defect in the opacified pulmonary arteries to suggest the presence of an acute pulmonary embolus. No evidence for dissection flap in the thoracic aorta. Lungs / Pleura: There is some mucus identified in the mid to distal  trachea. Subsegmental atelectasis evident in the lower lobes bilaterally. No pulmonary edema or pleural effusion. No suspicious pulmonary nodule or mass. No focal airspace consolidation. Upper Abdomen:  Unremarkable. MSK / Soft Tissues: Bone windows reveal no worrisome lytic or sclerotic osseous lesions. Review of the MIP images confirms the above findings. IMPRESSION: 1. No CT evidence for acute pulmonary embolus. 2. Dependent subsegmental atelectasis in the lungs bilaterally. 3. No specific findings to explain the patient's history of  difficulty breathing. Electronically Signed   By: Misty Stanley M.D.   On: 01/05/2015 17:56    Medications:  Prior to Admission:  Prescriptions prior to admission  Medication Sig Dispense Refill Last Dose  . albuterol (PROVENTIL HFA) 108 (90 BASE) MCG/ACT inhaler Inhale 2 puffs into the lungs every 6 (six) hours as needed for wheezing or shortness of breath.   01/05/2015 at Unknown time  . ALPRAZolam (XANAX) 1 MG tablet Take 1 mg by mouth 3 (three) times daily as needed. For anxiety   01/05/2015 at Unknown time  . amLODipine (NORVASC) 5 MG tablet Take 5 mg by mouth daily.   01/05/2015 at Unknown time  . atorvastatin (LIPITOR) 40 MG tablet Take 40 mg by mouth at bedtime.    01/04/2015 at Unknown time  . levETIRAcetam (KEPPRA) 1000 MG tablet Take 1,000 mg by mouth 3 (three) times daily.   01/05/2015 at Unknown time  . nitroGLYCERIN (NITROSTAT) 0.4 MG SL tablet Place 0.4 mg under the tongue every 5 (five) minutes as needed. Chest pain   unknown  . pantoprazole (PROTONIX) 40 MG tablet Take 40 mg by mouth daily.     01/05/2015 at Unknown time  . potassium chloride SA (K-DUR,KLOR-CON) 20 MEQ tablet Take 20 mEq by mouth 2 (two) times daily.   01/05/2015 at Unknown time  . traMADol (ULTRAM) 50 MG tablet Take 50 mg by mouth 4 (four) times daily.   01/05/2015 at Unknown time  . Valproic Acid (DEPAKENE) 250 MG/5ML SYRP syrup Take 250 mg by mouth 2 (two) times daily.    01/05/2015 at Unknown time   Scheduled: . albuterol  3 mL Inhalation Q4H  . amLODipine  5 mg Oral Daily  . atorvastatin  40 mg Oral QHS  . enoxaparin (LOVENOX) injection  40 mg Subcutaneous Q24H  . levETIRAcetam  1,000 mg Oral TID  . methylPREDNISolone (SOLU-MEDROL) injection  80 mg Intravenous Q12H  . midodrine  5 mg Oral TID WC  . pantoprazole  40 mg Oral Daily  . potassium chloride SA  20 mEq Oral BID  . sodium chloride  3 mL Intravenous Q12H  . Valproic Acid  250 mg Oral BID   Continuous:  FBX:UXYBFXOVAN, nitroGLYCERIN,  ondansetron **OR** ondansetron (ZOFRAN) IV, oxyCODONE Assesment: He was admitted with COPD exacerbation and hypoxia. He has had multiple falls. He is known to have atrial fibrillation but is not anticoagulated because of multiple falls. He has seizure disorder which is stable. He has severe problems with anxiety and depression/bipolar disease. He has had orthostatic hypotension and has come off his medication for that. Active Problems:   Essential hypertension   Atrial fibrillation (HCC)   COPD exacerbation ([place])   Falls    Plan: Continue with treatment for COPD. Continue the rest of his medications. Check orthostatic vital signs. Restart his medication for orthostasis. PT consultation. Evaluate to see if he is a candidate for home oxygen    LOS: 1 day   Jemery Stacey L 01/06/2015, 9:30 AM

## 2015-01-06 NOTE — NC FL2 (Signed)
[place] LEVEL OF CARE SCREENING TOOL     IDENTIFICATION  Patient Name: Jeffrey Sweeney Birthdate: Apr 12, 1945 Sex: male Admission Date (Current Location): 01/05/2015  [facility] and [place] Number:      Facility and Address:  [facility] [address], [place]      Provider Number: [id number]  Attending Physician Name and Address:  Sinda Du, MD  Relative Name and Phone Number:       Current Level of Care: Hospital Recommended Level of Care: [place] Prior Approval Number:    Date Approved/Denied:   PASRR Number: [id number]  Discharge Plan: SNF    Current Diagnoses: Patient Active Problem List   Diagnosis Date Noted  . COPD exacerbation ([place]) 01/05/2015  . Falls 01/05/2015  . Hypokalemia 08/14/2011  . DVT (deep venous thrombosis) ([place]) 08/14/2011  . Atrial fibrillation ([place]) 08/13/2011  . Rib fractures 08/07/2011  . Respiratory distress 08/07/2011  . COPD (chronic obstructive pulmonary disease) ([place]) 08/07/2011  . Atelectasis 08/07/2011  . H/O chest tube placement 08/07/2011  . Hemothorax 08/07/2011  . HYPERLIPIDEMIA 07/17/2010  . ANXIETY DEPRESSION 07/17/2010  . Essential hypertension 07/17/2010  . CAD 07/17/2010  . CORONARY ATHEROSCLEROSIS NATIVE CORONARY ARTERY 07/17/2010  . STROKE 07/17/2010  . CEREBROVASCULAR DISEASE 07/17/2010  . GERD 07/17/2010  . Seizure disorder ([place]) 07/17/2010  . SPLENIC INFARCTION 07/05/2009  . EMBOLISM&THROMBOSIS OF OTHER SPECIFIED ARTERY 07/05/2009  . EARLY SATIETY 07/05/2009  . WEIGHT LOSS, ABNORMAL 07/05/2009  . DIARRHEA 07/05/2009  . LUQ PAIN 07/05/2009  . PERSONAL HISTORY OF COLONIC POLYPS 07/05/2009  . Orthostatic hypotension 05/01/2009  . SYNCOPE 05/01/2009  . CHEST PAIN 05/01/2009  . LOW BACK PAIN, MILD 04/20/2009  . BIPOLAR AFFECTIVE DISORDER, HX OF 04/20/2009    Orientation RESPIRATION BLADDER Height & Weight    Self, Time, Situation, Place  O2  (3L) Incontinent 5\' 8"  (172.7 cm) 140 lbs.  BEHAVIORAL SYMPTOMS/MOOD NEUROLOGICAL BOWEL NUTRITION STATUS  Other (Comment) (n/a) Convulsions/Seizures (history) Incontinent Diet (Heart healthy)  AMBULATORY STATUS COMMUNICATION OF NEEDS Skin   Limited Assist Verbally Bruising                       Personal Care Assistance Level of Assistance  Bathing, Feeding, Dressing Bathing Assistance: Limited assistance Feeding assistance: Limited assistance Dressing Assistance: Limited assistance     Functional Limitations Info  Sight, Hearing, Speech Sight Info: Adequate Hearing Info: Adequate Speech Info: Adequate    SPECIAL CARE FACTORS FREQUENCY  PT (By licensed PT)     PT Frequency: 5              Contractures      Additional Factors Info  Psychotropic Code Status Info: Full code Allergies Info: Meperidine Hcl Psychotropic Info: Xanax         Current Medications (01/06/2015):  This is the current hospital active medication list Current Facility-Administered Medications  Medication Dose Route Frequency Provider Last Rate Last Dose  . albuterol (PROVENTIL) (2.5 MG/3ML) 0.083% nebulizer solution 3 mL  3 mL Inhalation Q4H Sinda Du, MD   3 mL at 01/06/15 1549  . ALPRAZolam Duanne Moron) tablet 1 mg  1 mg Oral TID PRN Doree Albee, MD   1 mg at 01/05/15 2201  . amLODipine (NORVASC) tablet 5 mg  5 mg Oral Daily Nimish C Gosrani, MD   5 mg at 01/06/15 1100  . atorvastatin (LIPITOR) tablet 40 mg  40 mg Oral QHS Nimish C Gosrani,  MD   40 mg at 01/05/15 2201  . enoxaparin (LOVENOX) injection 40 mg  40 mg Subcutaneous Q24H Nimish C Anastasio Champion, MD   40 mg at 01/05/15 2201  . levETIRAcetam (KEPPRA) tablet 1,000 mg  1,000 mg Oral TID Doree Albee, MD   1,000 mg at 01/06/15 1100  . methylPREDNISolone sodium succinate (SOLU-MEDROL) 125 mg/2 mL injection 80 mg  80 mg Intravenous Q12H Nimish C Gosrani, MD   80 mg at 01/06/15 1101  . midodrine (PROAMATINE) tablet 5 mg  5 mg Oral TID  WC Sinda Du, MD   5 mg at 01/06/15 1100  . nitroGLYCERIN (NITROSTAT) SL tablet 0.4 mg  0.4 mg Sublingual Q5 min PRN Nimish C Gosrani, MD      . ondansetron (ZOFRAN) tablet 4 mg  4 mg Oral Q6H PRN Nimish Luther Parody, MD       Or  . ondansetron (ZOFRAN) injection 4 mg  4 mg Intravenous Q6H PRN Nimish C Gosrani, MD      . oxyCODONE (Oxy IR/ROXICODONE) immediate release tablet 5 mg  5 mg Oral Q4H PRN Nimish C Gosrani, MD   5 mg at 01/06/15 1100  . pantoprazole (PROTONIX) EC tablet 40 mg  40 mg Oral Daily Nimish C Gosrani, MD   40 mg at 01/06/15 1100  . potassium chloride SA (K-DUR,KLOR-CON) CR tablet 20 mEq  20 mEq Oral BID Nimish C Anastasio Champion, MD   20 mEq at 01/06/15 1100  . sodium chloride 0.9 % injection 3 mL  3 mL Intravenous Q12H Nimish C Gosrani, MD   3 mL at 01/06/15 1101  . Valproic Acid (DEPAKENE) 250 MG/5ML syrup SYRP 250 mg  250 mg Oral BID Doree Albee, MD   250 mg at 01/06/15 1106     Discharge Medications: Please see discharge summary for a list of discharge medications.  Relevant Imaging Results:  Relevant Lab Results:   Additional Information [place] expires 02/05/15.  Benay Pike [place], [place]

## 2015-01-06 NOTE — Progress Notes (Signed)
Pt has home med (Xanax) locked up in pharmacy.

## 2015-01-06 NOTE — NC FL2 (Deleted)
[place] LEVEL OF CARE SCREENING TOOL     IDENTIFICATION  Patient Name: Jeffrey Sweeney Birthdate: July 02, 1945 Sex: male Admission Date (Current Location): 01/05/2015  [facility] and [place] Number:      Facility and Address:  [facility] [address] [place]      Provider Number: [id number]  Attending Physician Name and Address:  Sinda Du, MD  Relative Name and Phone Number:       Current Level of Care: Hospital Recommended Level of Care: [place] Prior Approval Number:    Date Approved/Denied:   PASRR Number:    Discharge Plan: SNF    Current Diagnoses: Patient Active Problem List   Diagnosis Date Noted  . COPD exacerbation ([place]) 01/05/2015  . Falls 01/05/2015  . Hypokalemia 08/14/2011  . DVT (deep venous thrombosis) ([place]) 08/14/2011  . Atrial fibrillation ([place]) 08/13/2011  . Rib fractures 08/07/2011  . Respiratory distress 08/07/2011  . COPD (chronic obstructive pulmonary disease) ([place]) 08/07/2011  . Atelectasis 08/07/2011  . H/O chest tube placement 08/07/2011  . Hemothorax 08/07/2011  . HYPERLIPIDEMIA 07/17/2010  . ANXIETY DEPRESSION 07/17/2010  . Essential hypertension 07/17/2010  . CAD 07/17/2010  . CORONARY ATHEROSCLEROSIS NATIVE CORONARY ARTERY 07/17/2010  . STROKE 07/17/2010  . CEREBROVASCULAR DISEASE 07/17/2010  . GERD 07/17/2010  . Seizure disorder ([place]) 07/17/2010  . SPLENIC INFARCTION 07/05/2009  . EMBOLISM&THROMBOSIS OF OTHER SPECIFIED ARTERY 07/05/2009  . EARLY SATIETY 07/05/2009  . WEIGHT LOSS, ABNORMAL 07/05/2009  . DIARRHEA 07/05/2009  . LUQ PAIN 07/05/2009  . PERSONAL HISTORY OF COLONIC POLYPS 07/05/2009  . Orthostatic hypotension 05/01/2009  . SYNCOPE 05/01/2009  . CHEST PAIN 05/01/2009  . LOW BACK PAIN, MILD 04/20/2009  . BIPOLAR AFFECTIVE DISORDER, HX OF 04/20/2009    Orientation RESPIRATION BLADDER Height & Weight    Self, Time, Situation, Place  O2 (3L)  Incontinent 5\' 8"  (172.7 cm) 140 lbs.  BEHAVIORAL SYMPTOMS/MOOD NEUROLOGICAL BOWEL NUTRITION STATUS  Other (Comment) (n/a) Convulsions/Seizures (history) Incontinent Diet (Heart healthy)  AMBULATORY STATUS COMMUNICATION OF NEEDS Skin   Limited Assist Verbally Bruising                       Personal Care Assistance Level of Assistance  Bathing, Feeding, Dressing Bathing Assistance: Limited assistance Feeding assistance: Limited assistance Dressing Assistance: Limited assistance     Functional Limitations Info  Sight, Hearing, Speech Sight Info: Adequate Hearing Info: Adequate Speech Info: Adequate    SPECIAL CARE FACTORS FREQUENCY  PT (By licensed PT)     PT Frequency: 5              Contractures      Additional Factors Info  Psychotropic Code Status Info: Full code Allergies Info: Meperidine Hcl Psychotropic Info: Xanax         Current Medications (01/06/2015):  This is the current hospital active medication list Current Facility-Administered Medications  Medication Dose Route Frequency Provider Last Rate Last Dose  . albuterol (PROVENTIL) (2.5 MG/3ML) 0.083% nebulizer solution 3 mL  3 mL Inhalation Q4H Sinda Du, MD   3 mL at 01/06/15 1201  . ALPRAZolam Duanne Moron) tablet 1 mg  1 mg Oral TID PRN Doree Albee, MD   1 mg at 01/05/15 2201  . amLODipine (NORVASC) tablet 5 mg  5 mg Oral Daily Nimish C Gosrani, MD   5 mg at 01/06/15 1100  . atorvastatin (LIPITOR) tablet 40 mg  40 mg Oral QHS Nimish C  Anastasio Champion, MD   40 mg at 01/05/15 2201  . enoxaparin (LOVENOX) injection 40 mg  40 mg Subcutaneous Q24H Nimish C Anastasio Champion, MD   40 mg at 01/05/15 2201  . levETIRAcetam (KEPPRA) tablet 1,000 mg  1,000 mg Oral TID Doree Albee, MD   1,000 mg at 01/06/15 1100  . methylPREDNISolone sodium succinate (SOLU-MEDROL) 125 mg/2 mL injection 80 mg  80 mg Intravenous Q12H Nimish C Gosrani, MD   80 mg at 01/06/15 1101  . midodrine (PROAMATINE) tablet 5 mg  5 mg Oral TID WC  Sinda Du, MD   5 mg at 01/06/15 1100  . nitroGLYCERIN (NITROSTAT) SL tablet 0.4 mg  0.4 mg Sublingual Q5 min PRN Nimish C Gosrani, MD      . ondansetron (ZOFRAN) tablet 4 mg  4 mg Oral Q6H PRN Nimish Luther Parody, MD       Or  . ondansetron (ZOFRAN) injection 4 mg  4 mg Intravenous Q6H PRN Nimish C Gosrani, MD      . oxyCODONE (Oxy IR/ROXICODONE) immediate release tablet 5 mg  5 mg Oral Q4H PRN Nimish C Gosrani, MD   5 mg at 01/06/15 1100  . pantoprazole (PROTONIX) EC tablet 40 mg  40 mg Oral Daily Nimish C Gosrani, MD   40 mg at 01/06/15 1100  . potassium chloride SA (K-DUR,KLOR-CON) CR tablet 20 mEq  20 mEq Oral BID Nimish C Anastasio Champion, MD   20 mEq at 01/06/15 1100  . sodium chloride 0.9 % injection 3 mL  3 mL Intravenous Q12H Nimish C Gosrani, MD   3 mL at 01/06/15 1101  . Valproic Acid (DEPAKENE) 250 MG/5ML syrup SYRP 250 mg  250 mg Oral BID Doree Albee, MD   250 mg at 01/06/15 1106     Discharge Medications: Please see discharge summary for a list of discharge medications.  Relevant Imaging Results:  Relevant Lab Results:   Additional Information    [name], [place]

## 2015-01-06 NOTE — Clinical Social Work Note (Signed)
Clinical Social Work Assessment  Patient Details  Name: Jeffrey Sweeney MRN: [id number] Date of Birth: [birth year]  Date of referral:  01/06/15               Reason for consult:  Discharge Planning                Permission sought to share information with:    Permission granted to share information::     Name::        Agency::     Relationship::     Contact Information:     Housing/Transportation Living arrangements for the past 2 months:  Single Family Home Source of Information:  Patient Patient Interpreter Needed:  None Criminal Activity/Legal Involvement Pertinent to Current Situation/Hospitalization:  No - Comment as needed Significant Relationships:  Adult Children Lives with:  Adult Children Do you feel safe going back to the place where you live?  No Need for family participation in patient care:  Yes (Comment)  Care giving concerns:  Pt is alone during day.    Social Worker assessment / plan:  [name] met with pt at bedside. Pt alert and oriented and reports he lives with his son, but he is gone during the day from 5 AM to 6 PM. At baseline, pt is fairly independent and manages well. He states he has had a few falls at home recently. PT evaluated pt and recommendation is for SNF. [name] discussed placement process, including insurance authorization. He is agreeable to [facility] only at this time. Pt plans to discuss with children. [facility]) aware of referral. Anticipate d/c tomorrow per MD.   Employment status:  Retired Nurse, adult PT Recommendations:  [place] / Referral to community resources:  [place]  Patient/Family's Response to care:  Pt agreeable to short term SNF.   Patient/Family's Understanding of and Emotional Response to Diagnosis, Current Treatment, and Prognosis:  Pt aware of admission diagnosis and treatment plan.   Emotional Assessment Appearance:  Appears stated  age Attitude/Demeanor/Rapport:  Other (Cooperative) Affect (typically observed):  Appropriate Orientation:  Oriented to Self, Oriented to Place, Oriented to  Time, Oriented to Situation Alcohol / Substance use:  Not Applicable Psych involvement (Current and /or in the community):  No (Comment)  Discharge Needs  Concerns to be addressed:  Discharge Planning Concerns Readmission within the last 30 days:  No Current discharge risk:  Physical Impairment Barriers to Discharge:  Continued Medical Work up   [employer], [employer], [place] 01/06/2015, 1:16 PM [phone]

## 2015-01-06 NOTE — Clinical Social Work Note (Signed)
Discussed placement with pt and daughter. Bed accepted at [facility]. Facility and [facility] notified. Facility aware of 30 day pasarr. Anticipate d/c Saturday.  Benay Pike, [place]

## 2015-01-06 NOTE — Care Management Note (Signed)
Case Management Note  Patient Details  Name: KYLIN REDHEAD MRN: [id number] Date of Birth: [birth year]  Subjective/Objective:                  Pt is from home, lives with his son and is ind with ADL's. Pt has no HH serivces prior to admission, pt has cane and walker if needed. Pt has no home O2 or neb machine prior to admission. PT has recommended SNF and [name] is working with pt on placement.  Action/Plan: No CM needs.   Expected Discharge Date:    01/07/2015              Expected Discharge Plan:  Skilled Nursing Facility  In-House Referral:  Clinical Social Work  Discharge planning Services  CM Consult  Post Acute Care Choice:  NA Choice offered to:  NA  DME Arranged:    DME Agency:     HH Arranged:    [place] Agency:     Status of Service:  Completed, signed off  Medicare Important Message Given:    Date Medicare IM Given:    Medicare IM give by:    Date Additional Medicare IM Given:    Additional Medicare Important Message give by:     If discussed at [place] of Stay Meetings, dates discussed:    Additional Comments:  Sherald Barge, RN 01/06/2015, 2:11 PM

## 2015-01-06 NOTE — Clinical Social Work Placement (Signed)
   CLINICAL SOCIAL WORK PLACEMENT  NOTE  Date:  01/06/2015  Patient Details  Name: Jeffrey Sweeney MRN: [id number] Date of Birth: 10/08/1945  Clinical Social Work is seeking post-discharge placement for this patient at the [place] level of care (*[name] will initial, date and re-position this form in  chart as items are completed):  Yes   Patient/family provided with [place] Work Department's list of facilities offering this level of care within the geographic area requested by the patient (or if unable, by the patient's family).  Yes   Patient/family informed of their freedom to choose among providers that offer the needed level of care, that participate in Medicare, Medicaid or managed care program needed by the patient, have an available bed and are willing to accept the patient.  Yes   Patient/family informed of [facility]'s ownership interest in [facility] and [facility], as well as of the fact that they are under no obligation to receive care at these facilities.  PASRR submitted to EDS on 01/06/15     PASRR number received on       Existing PASRR number confirmed on       FL2 transmitted to all facilities in geographic area requested by pt/family on 01/06/15     FL2 transmitted to all facilities within larger geographic area on       Patient informed that his/her managed care company has contracts with or will negotiate with certain facilities, including the following:            Patient/family informed of bed offers received.  Patient chooses bed at       Physician recommends and patient chooses bed at      Patient to be transferred to   on  .  Patient to be transferred to facility by       Patient family notified on   of transfer.  Name of family member notified:        PHYSICIAN       Additional Comment:    _______________________________________________ [name], LCSW 01/06/2015, 1:14  PM [phone]

## 2015-01-06 NOTE — Clinical Social Work Placement (Signed)
   CLINICAL SOCIAL WORK PLACEMENT  NOTE  Date:  01/06/2015  Patient Details  Name: Jeffrey Sweeney MRN: [id number] Date of Birth: [birth year]  Clinical Social Work is seeking post-discharge placement for this patient at the [place] level of care (*[name] will initial, date and re-position this form in  chart as items are completed):  Yes   Patient/family provided with [place] Work Department's list of facilities offering this level of care within the geographic area requested by the patient (or if unable, by the patient's family).  Yes   Patient/family informed of their freedom to choose among providers that offer the needed level of care, that participate in Medicare, Medicaid or managed care program needed by the patient, have an available bed and are willing to accept the patient.  Yes   Patient/family informed of [facility]'s ownership interest in [facility] and [facility], as well as of the fact that they are under no obligation to receive care at these facilities.  PASRR submitted to EDS on 01/06/15     PASRR number received on 01/06/15     Existing PASRR number confirmed on       FL2 transmitted to all facilities in geographic area requested by pt/family on 01/06/15     FL2 transmitted to all facilities within larger geographic area on       Patient informed that his/her managed care company has contracts with or will negotiate with certain facilities, including the following:        Yes   Patient/family informed of bed offers received.  Patient chooses bed at [facility]     Physician recommends and patient chooses bed at      Patient to be transferred to [facility] [place] on  .  Patient to be transferred to facility by       Patient family notified on   of transfer.  Name of family member notified:        PHYSICIAN       Additional Comment:    _______________________________________________ [name], [place] 01/06/2015, 4:17 PM [phone]

## 2015-01-06 NOTE — Evaluation (Signed)
Physical Therapy Evaluation Patient Details Name: Jeffrey Sweeney MRN: [id number] DOB: [birth year] Today's Date: 01/06/2015   History of Present Illness  This is a 69 year old man, ex-smoker, who is at a 2 week history of dyspnea associated with a cough productive of white sputum has had a couple of falls in the last 2 days. Investigations in the emergency room a couple of days ago did not show any broken bones. He now presents again with another fall yesterday. He came in today complaining of low back pain and left anterior chest pain since the fall. X-rays do not show fractures. He was found to be hypoxic in the ER. He denies any hemoptysis. Is now being admitted for further investigation and management.  Clinical Impression   Pt was seen for evaluation, alert and oriented.  He reports numerous falls at home but indicates that they were all due to tripping over objects on the floor.  He has old left UE hemiparesis due to stroke but does have min function there.  He is found to have generalized weakness and deconditioning, gait is very unstable with a walker.  He currently requires 2 L O2 to maintain O2 sat over 90%.  Pt lives with his son who works daytime.  I am recommending SNF for him at d/c as he presents an extremely high risk of continued falls.    Follow Up Recommendations SNF    Equipment Recommendations  None recommended by PT    Recommendations for Other Services  none     Precautions / Restrictions Precautions Precautions: Fall Restrictions Weight Bearing Restrictions: No      Mobility  Bed Mobility Overal bed mobility: Modified Independent                Transfers Overall transfer level: Needs assistance Equipment used: None Transfers: Sit to/from Stand Sit to Stand: Min guard         General transfer comment: pt very unsteady with standing and holds onto the bedrail for support  Ambulation/Gait Ambulation/Gait assistance: Min assist Ambulation  Distance (Feet): 20 Feet Assistive device: Rolling walker (2 wheeled) Gait Pattern/deviations: Decreased stride length;Shuffle;Narrow base of support   Gait velocity interpretation: <1.8 ft/sec, indicative of risk for recurrent falls General Gait Details: pt frequently repeats how scared he is of falling...  Stairs            Wheelchair Mobility    Modified Rankin (Stroke Patients Only)       Balance Overall balance assessment: Needs assistance Sitting-balance support: No upper extremity supported;Feet supported Sitting balance-Leahy Scale: Good     Standing balance support: Bilateral upper extremity supported Standing balance-Leahy Scale: Fair Standing balance comment: leans slightly to the right with stance                             Pertinent Vitals/Pain Pain Assessment: No/denies pain    Home Living Family/patient expects to be discharged to:: Skilled nursing facility Living Arrangements: Children               Additional Comments: son is at work all day and pt is alone    Prior Function Level of Independence: Independent         Comments: used to use a 4 wheeled walker but no longer uses it     Hand Dominance   Dominant Hand: Right    Extremity/Trunk Assessment   Upper Extremity Assessment: LUE deficits/detail  LUE Deficits / Details: old stroke with residual weakness in the entire LUE but is able to use hand fairly well   Lower Extremity Assessment: Generalized weakness      Cervical / Trunk Assessment: Kyphotic  Communication   Communication: No difficulties  Cognition Arousal/Alertness: Awake/alert Behavior During Therapy: WFL for tasks assessed/performed Overall Cognitive Status: Within Functional Limits for tasks assessed                      General Comments      Exercises        Assessment/Plan    PT Assessment Patient needs continued PT services  PT Diagnosis Difficulty  walking;Abnormality of gait;Generalized weakness   PT Problem List Decreased strength;Decreased activity tolerance;Decreased balance;Decreased mobility;Cardiopulmonary status limiting activity;Decreased knowledge of precautions  PT Treatment Interventions Gait training;Functional mobility training;Therapeutic exercise   PT Goals (Current goals can be found in the Care Plan section) Acute Rehab PT Goals Patient Stated Goal: doesn't want to fall anymore PT Goal Formulation: With patient Time For Goal Achievement: 01/20/15 Potential to Achieve Goals: Good    Frequency Min 2X/week   Barriers to discharge Decreased caregiver support alone during the day    Co-evaluation               End of Session Equipment Utilized During Treatment: Gait belt;Oxygen Activity Tolerance: Patient tolerated treatment well Patient left: in bed;with call bell/phone within reach;with bed alarm set           Time: [id number] PT Time Calculation (min) (ACUTE ONLY): 26 min   Charges:   PT Evaluation $Initial PT Evaluation Tier I: 1 Procedure     PT G CodesSable Feil  PT 01/06/2015, 10:34 AM [phone]

## 2015-01-07 DIAGNOSIS — J440 Chronic obstructive pulmonary disease with acute lower respiratory infection: Secondary | ICD-10-CM | POA: Diagnosis not present

## 2015-01-07 DIAGNOSIS — R569 Unspecified convulsions: Secondary | ICD-10-CM | POA: Diagnosis not present

## 2015-01-07 DIAGNOSIS — I481 Persistent atrial fibrillation: Secondary | ICD-10-CM | POA: Diagnosis not present

## 2015-01-07 DIAGNOSIS — I248 Other forms of acute ischemic heart disease: Secondary | ICD-10-CM | POA: Diagnosis not present

## 2015-01-07 DIAGNOSIS — Z8673 Personal history of transient ischemic attack (TIA), and cerebral infarction without residual deficits: Secondary | ICD-10-CM | POA: Diagnosis not present

## 2015-01-07 DIAGNOSIS — A419 Sepsis, unspecified organism: Secondary | ICD-10-CM | POA: Diagnosis not present

## 2015-01-07 DIAGNOSIS — J449 Chronic obstructive pulmonary disease, unspecified: Secondary | ICD-10-CM | POA: Diagnosis not present

## 2015-01-07 DIAGNOSIS — R2681 Unsteadiness on feet: Secondary | ICD-10-CM | POA: Diagnosis not present

## 2015-01-07 DIAGNOSIS — I4891 Unspecified atrial fibrillation: Secondary | ICD-10-CM | POA: Diagnosis not present

## 2015-01-07 DIAGNOSIS — Z9181 History of falling: Secondary | ICD-10-CM | POA: Diagnosis not present

## 2015-01-07 DIAGNOSIS — Z743 Need for continuous supervision: Secondary | ICD-10-CM | POA: Diagnosis not present

## 2015-01-07 DIAGNOSIS — G40909 Epilepsy, unspecified, not intractable, without status epilepticus: Secondary | ICD-10-CM | POA: Diagnosis not present

## 2015-01-07 DIAGNOSIS — F17210 Nicotine dependence, cigarettes, uncomplicated: Secondary | ICD-10-CM | POA: Diagnosis present

## 2015-01-07 DIAGNOSIS — R509 Fever, unspecified: Secondary | ICD-10-CM | POA: Diagnosis not present

## 2015-01-07 DIAGNOSIS — I69354 Hemiplegia and hemiparesis following cerebral infarction affecting left non-dominant side: Secondary | ICD-10-CM | POA: Diagnosis not present

## 2015-01-07 DIAGNOSIS — M810 Age-related osteoporosis without current pathological fracture: Secondary | ICD-10-CM | POA: Diagnosis present

## 2015-01-07 DIAGNOSIS — R296 Repeated falls: Secondary | ICD-10-CM | POA: Diagnosis present

## 2015-01-07 DIAGNOSIS — J189 Pneumonia, unspecified organism: Secondary | ICD-10-CM | POA: Diagnosis not present

## 2015-01-07 DIAGNOSIS — Y95 Nosocomial condition: Secondary | ICD-10-CM | POA: Diagnosis present

## 2015-01-07 DIAGNOSIS — J441 Chronic obstructive pulmonary disease with (acute) exacerbation: Secondary | ICD-10-CM | POA: Diagnosis not present

## 2015-01-07 DIAGNOSIS — J9611 Chronic respiratory failure with hypoxia: Secondary | ICD-10-CM | POA: Diagnosis not present

## 2015-01-07 DIAGNOSIS — R531 Weakness: Secondary | ICD-10-CM | POA: Diagnosis not present

## 2015-01-07 DIAGNOSIS — M6281 Muscle weakness (generalized): Secondary | ICD-10-CM | POA: Diagnosis not present

## 2015-01-07 DIAGNOSIS — F419 Anxiety disorder, unspecified: Secondary | ICD-10-CM | POA: Diagnosis not present

## 2015-01-07 DIAGNOSIS — M4856XA Collapsed vertebra, not elsewhere classified, lumbar region, initial encounter for fracture: Secondary | ICD-10-CM | POA: Diagnosis present

## 2015-01-07 DIAGNOSIS — R261 Paralytic gait: Secondary | ICD-10-CM | POA: Diagnosis not present

## 2015-01-07 DIAGNOSIS — J019 Acute sinusitis, unspecified: Secondary | ICD-10-CM | POA: Diagnosis not present

## 2015-01-07 DIAGNOSIS — R1310 Dysphagia, unspecified: Secondary | ICD-10-CM | POA: Diagnosis not present

## 2015-01-07 DIAGNOSIS — R4182 Altered mental status, unspecified: Secondary | ICD-10-CM | POA: Diagnosis not present

## 2015-01-07 DIAGNOSIS — I10 Essential (primary) hypertension: Secondary | ICD-10-CM | POA: Diagnosis not present

## 2015-01-07 DIAGNOSIS — I2510 Atherosclerotic heart disease of native coronary artery without angina pectoris: Secondary | ICD-10-CM | POA: Diagnosis not present

## 2015-01-07 DIAGNOSIS — R0902 Hypoxemia: Secondary | ICD-10-CM | POA: Diagnosis present

## 2015-01-07 DIAGNOSIS — F3160 Bipolar disorder, current episode mixed, unspecified: Secondary | ICD-10-CM | POA: Diagnosis not present

## 2015-01-07 DIAGNOSIS — I951 Orthostatic hypotension: Secondary | ICD-10-CM | POA: Diagnosis not present

## 2015-01-07 MED ORDER — PREDNISONE 20 MG PO TABS: 40.0000 mg | ORAL_TABLET | Freq: Every day | ORAL | Status: DC

## 2015-01-07 MED ORDER — ALBUTEROL SULFATE (2.5 MG/3ML) 0.083% IN NEBU: 2.5000 mg | INHALATION_SOLUTION | RESPIRATORY_TRACT | Status: DC | PRN

## 2015-01-07 MED ORDER — MIDODRINE HCL 5 MG PO TABS: 5.0000 mg | ORAL_TABLET | Freq: Three times a day (TID) | ORAL | Status: DC

## 2015-01-07 MED ORDER — ALENDRONATE SODIUM 10 MG PO TABS
40.0000 mg | ORAL_TABLET | ORAL | Status: DC
Start: 2015-01-08 — End: 2016-01-04

## 2015-01-07 MED ORDER — ALENDRONATE SODIUM 10 MG PO TABS
40.0000 mg | ORAL_TABLET | ORAL | Status: DC
  Filled 2015-01-07: qty 4

## 2015-01-07 MED ORDER — TIOTROPIUM BROMIDE MONOHYDRATE 18 MCG IN CAPS: 18.0000 ug | ORAL_CAPSULE | Freq: Every day | RESPIRATORY_TRACT | Status: DC

## 2015-01-07 MED ORDER — TIOTROPIUM BROMIDE MONOHYDRATE 18 MCG IN CAPS
18.0000 ug | ORAL_CAPSULE | Freq: Every day | RESPIRATORY_TRACT | Status: DC
  Filled 2015-01-07: qty 5

## 2015-01-07 MED ORDER — OXYCODONE HCL 5 MG PO TABS: 5.0000 mg | ORAL_TABLET | ORAL | Status: DC | PRN

## 2015-01-07 MED ORDER — BUDESONIDE-FORMOTEROL FUMARATE 160-4.5 MCG/ACT IN AERO: 2.0000 | INHALATION_SPRAY | Freq: Two times a day (BID) | RESPIRATORY_TRACT | Status: DC

## 2015-01-07 MED ORDER — PREDNISONE 20 MG PO TABS
40.0000 mg | ORAL_TABLET | Freq: Every day | ORAL | Status: DC
  Administered 2015-01-07: 40 mg via ORAL
  Filled 2015-01-07: qty 2

## 2015-01-07 MED ORDER — ALBUTEROL SULFATE (2.5 MG/3ML) 0.083% IN NEBU: 3.0000 mL | INHALATION_SOLUTION | Freq: Three times a day (TID) | RESPIRATORY_TRACT | Status: DC

## 2015-01-07 MED ORDER — BUDESONIDE-FORMOTEROL FUMARATE 160-4.5 MCG/ACT IN AERO
2.0000 | INHALATION_SPRAY | Freq: Two times a day (BID) | RESPIRATORY_TRACT | Status: DC
  Filled 2015-01-07: qty 6

## 2015-01-07 NOTE — Progress Notes (Signed)
Called to give report regarding Pt being transferred to [facility]. [name] answered and requested Tywan, the social worker give an authorization number. Called Tywan and Explained the need for that number. Called [name] back to see if Tywan had given her the number and Jackelyn Poling stated " Tywan was working on it and without the number we cant take him" Paperwork is ready and Bed has been approved. Will called [name] back to see if Lenell Antu has finished the authorization number process and then give report.

## 2015-01-07 NOTE — Discharge Summary (Signed)
Physician Discharge Summary  Patient ID: Jeffrey Sweeney MRN: [id number] DOB/AGE: September 22, 1945 69 y.o. Primary Care Physician:Lynard Postlewait L, MD Admit date: 01/05/2015 Discharge date: 01/07/2015    Discharge Diagnoses:   Active Problems:   Essential hypertension   CORONARY ATHEROSCLEROSIS NATIVE CORONARY ARTERY   SYNCOPE   Bipolar 1 disorder, mixed, moderate (HCC)   Seizure disorder (HCC)   Rib fractures   Atrial fibrillation (HCC)   COPD exacerbation (HCC)   Falls   Osteoporosis   Compression fracture of lumbar spine, non-traumatic     Medication List    STOP taking these medications        traMADol 50 MG tablet  Commonly known as:  ULTRAM      TAKE these medications        alendronate 10 MG tablet  Commonly known as:  FOSAMAX  Take 4 tablets (40 mg total) by mouth every 7 (seven) days. Take with a full glass of water on an empty stomach.  Start taking on:  01/08/2015     ALPRAZolam 1 MG tablet  Commonly known as:  XANAX  Take 1 mg by mouth 3 (three) times daily as needed. For anxiety     amLODipine 5 MG tablet  Commonly known as:  NORVASC  Take 5 mg by mouth daily.     atorvastatin 40 MG tablet  Commonly known as:  LIPITOR  Take 40 mg by mouth at bedtime.     budesonide-formoterol 160-4.5 MCG/ACT inhaler  Commonly known as:  SYMBICORT  Inhale 2 puffs into the lungs 2 (two) times daily.     levETIRAcetam 1000 MG tablet  Commonly known as:  KEPPRA  Take 1,000 mg by mouth 3 (three) times daily.     midodrine 5 MG tablet  Commonly known as:  PROAMATINE  Take 1 tablet (5 mg total) by mouth 3 (three) times daily with meals.     nitroGLYCERIN 0.4 MG SL tablet  Commonly known as:  NITROSTAT  Place 0.4 mg under the tongue every 5 (five) minutes as needed. Chest pain     oxyCODONE 5 MG immediate release tablet  Commonly known as:  Oxy IR/ROXICODONE  Take 1 tablet (5 mg total) by mouth every 4 (four) hours as needed for moderate pain.     pantoprazole 40  MG tablet  Commonly known as:  PROTONIX  Take 40 mg by mouth daily.     potassium chloride SA 20 MEQ tablet  Commonly known as:  K-DUR,KLOR-CON  Take 20 mEq by mouth 2 (two) times daily.     predniSONE 20 MG tablet  Commonly known as:  DELTASONE  Take 2 tablets (40 mg total) by mouth daily with breakfast.     albuterol (2.5 MG/3ML) 0.083% nebulizer solution  Commonly known as:  PROVENTIL  Take 3 mLs (2.5 mg total) by nebulization every 2 (two) hours as needed for wheezing or shortness of breath.     PROVENTIL HFA 108 (90 Base) MCG/ACT inhaler  Generic drug:  albuterol  Inhale 2 puffs into the lungs every 6 (six) hours as needed for wheezing or shortness of breath.     tiotropium 18 MCG inhalation capsule  Commonly known as:  SPIRIVA  Place 1 capsule (18 mcg total) into inhaler and inhale daily.     Valproic Acid 250 MG/5ML Syrp syrup  Commonly known as:  DEPAKENE  Take 250 mg by mouth 2 (two) times daily.        Discharged Condition: Improved  Consults: None  Significant Diagnostic Studies: Dg Chest 1 View  12/31/2014  CLINICAL DATA:  Fall. EXAM: CHEST 1 VIEW COMPARISON:  12/16/2012 FINDINGS: Mild cardiac enlargement. No pleural effusion or edema. No airspace consolidation. Left posterior rib fracture deformities are again noted, chronic. IMPRESSION: 1. No acute cardiopulmonary abnormalities. 2. Chronic left posterior rib fractures. Electronically Signed   By: Kerby Moors M.D.   On: 12/31/2014 10:52   Dg Chest 2 View  01/05/2015  CLINICAL DATA:  Two falls in 2 days. Left chest pain, landing on chest. History of rib fractures. Shortness of breath and productive cough for 2 weeks. EXAM: CHEST  2 VIEW COMPARISON:  12/31/2014 FINDINGS: Mild elevation of the right hemidiaphragm. Mild cardiomegaly. No confluent airspace opacities or effusions. Mild cardiomegaly. Old left rib fractures are again noted, stable. No visible acute rib fractures. No pneumothorax. IMPRESSION: Old  left rib fractures.  No visible new/ acute rib fractures. Mild cardiomegaly. Electronically Signed   By: Rolm Baptise M.D.   On: 01/05/2015 15:30   Dg Lumbar Spine Complete  01/05/2015  CLINICAL DATA:  Pain following recent falls EXAM: LUMBAR SPINE - COMPLETE 4+ VIEW COMPARISON:  December 31, 2014 FINDINGS: Frontal, lateral, spot lumbosacral lateral, and bilateral oblique views were obtained. There are 5 non-rib-bearing lumbar type vertebral bodies. There is lumbar dextroscoliosis. Anterior wedge compression fractures of L1, L2, and L3 are stable in appearance. There is also mild apparent concavity along the superior endplates of 624THL and 624THL, stable and likely due in part to the scoliosis. There is no new fracture. No spondylolisthesis. There is mild disc space narrowing at L4-5 and L5-S1 as well as at L1-2, stable. There is facet osteoarthritic change at L4-5 and L5-S1 bilaterally. IMPRESSION: Anterior wedge compression fractures at L1, L2, and L3 are stable compared to recent prior study. No new fracture. No spondylolisthesis. Stable scoliosis and areas of arthropathy. Electronically Signed   By: Lowella Grip III M.D.   On: 01/05/2015 15:13   Dg Lumbar Spine Complete  12/31/2014  CLINICAL DATA:  Status post fall at home with pain of lower back. EXAM: LUMBAR SPINE - COMPLETE 4+ VIEW COMPARISON:  CT abdomen pelvis August 07, 2011 FINDINGS: There is 50% compression deformity of L1. There is 20% compression deformity of L2 and L3. No definite cortical discontinuity is identified in L1, L2 and L3. Degenerative joint changes are identified throughout lumbar spine. There is no dislocation. There is chronic posttraumatic change of the left tenth rib present on prior CT of 2013. IMPRESSION: Compression deformities of L1 through L3, probably chronic. There is no dislocation. Electronically Signed   By: Abelardo Diesel M.D.   On: 12/31/2014 10:51   Ct Head Wo Contrast  12/31/2014  CLINICAL DATA:  Trauma. Fall.  Hit head 1 hour ago. Loss of consciousness and laceration to posterior head. EXAM: CT HEAD WITHOUT CONTRAST CT CERVICAL SPINE WITHOUT CONTRAST TECHNIQUE: Multidetector CT imaging of the head and cervical spine was performed following the standard protocol without intravenous contrast. Multiplanar CT image reconstructions of the cervical spine were also generated. COMPARISON:  08/07/2011 FINDINGS: CT HEAD FINDINGS Encephalomalacia involving the right cerebral hemisphere is again noted and appears unchanged from previous exam compatible with chronic MCA infarct. Prominence of the sulci and ventricles noted consistent with brain atrophy. No evidence for acute brain infarct, acute intracranial hemorrhage or mass. Chronic opacification of the left maxillary sinus identified. The mastoid air cells are clear. The the calvarium is intact. Posterior scout laceration and  hematoma identified CT CERVICAL SPINE FINDINGS Normal alignment of the cervical spine. The vertebral body heights are well preserved. Disc space narrowing and ventral endplate spurring is noted. Most advanced at C5-6 and C6-7. The facet joints are all well aligned. The prevertebral soft tissue space appears normal. No fractures or subluxations identified. IMPRESSION: 1. No acute intracranial abnormalities. 2. Chronic right MCA infarct. 3. Chronic left maxillary sinus disease. 4. Posterior scalp laceration 5. No evidence for cervical spine fracture. 6. Cervical degenerative disc disease noted. Electronically Signed   By: Kerby Moors M.D.   On: 12/31/2014 11:12   Ct Angio Chest Pe W/cm &/or Wo Cm  01/05/2015  CLINICAL DATA:  Initial encounter for 2 week history of dyspnea and left anterior chest pain with hypoxia. Increased D-dimer. EXAM: CT ANGIOGRAPHY CHEST WITH CONTRAST TECHNIQUE: Multidetector CT imaging of the chest was performed using the standard protocol during bolus administration of intravenous contrast. Multiplanar CT image reconstructions and  MIPs were obtained to evaluate the vascular anatomy. CONTRAST:  119mL OMNIPAQUE IOHEXOL 350 MG/ML SOLN COMPARISON:  08/15/2011. FINDINGS: Mediastinum / Lymph Nodes: There is no axillary lymphadenopathy. Scattered small lymph nodes in the mediastinum do not meet CT criteria for pathologic enlargement. There is no hilar lymphadenopathy. The esophagus has normal imaging features. The heart size is normal. No pericardial effusion. Coronary artery calcification is noted. No filling defect in the opacified pulmonary arteries to suggest the presence of an acute pulmonary embolus. No evidence for dissection flap in the thoracic aorta. Lungs / Pleura: There is some mucus identified in the mid to distal trachea. Subsegmental atelectasis evident in the lower lobes bilaterally. No pulmonary edema or pleural effusion. No suspicious pulmonary nodule or mass. No focal airspace consolidation. Upper Abdomen:  Unremarkable. MSK / Soft Tissues: Bone windows reveal no worrisome lytic or sclerotic osseous lesions. Review of the MIP images confirms the above findings. IMPRESSION: 1. No CT evidence for acute pulmonary embolus. 2. Dependent subsegmental atelectasis in the lungs bilaterally. 3. No specific findings to explain the patient's history of difficulty breathing. Electronically Signed   By: Misty Stanley M.D.   On: 01/05/2015 17:56   Ct Cervical Spine Wo Contrast  12/31/2014  CLINICAL DATA:  Trauma. Fall. Hit head 1 hour ago. Loss of consciousness and laceration to posterior head. EXAM: CT HEAD WITHOUT CONTRAST CT CERVICAL SPINE WITHOUT CONTRAST TECHNIQUE: Multidetector CT imaging of the head and cervical spine was performed following the standard protocol without intravenous contrast. Multiplanar CT image reconstructions of the cervical spine were also generated. COMPARISON:  08/07/2011 FINDINGS: CT HEAD FINDINGS Encephalomalacia involving the right cerebral hemisphere is again noted and appears unchanged from previous exam  compatible with chronic MCA infarct. Prominence of the sulci and ventricles noted consistent with brain atrophy. No evidence for acute brain infarct, acute intracranial hemorrhage or mass. Chronic opacification of the left maxillary sinus identified. The mastoid air cells are clear. The the calvarium is intact. Posterior scout laceration and hematoma identified CT CERVICAL SPINE FINDINGS Normal alignment of the cervical spine. The vertebral body heights are well preserved. Disc space narrowing and ventral endplate spurring is noted. Most advanced at C5-6 and C6-7. The facet joints are all well aligned. The prevertebral soft tissue space appears normal. No fractures or subluxations identified. IMPRESSION: 1. No acute intracranial abnormalities. 2. Chronic right MCA infarct. 3. Chronic left maxillary sinus disease. 4. Posterior scalp laceration 5. No evidence for cervical spine fracture. 6. Cervical degenerative disc disease noted. Electronically Signed   By:  Kerby Moors M.D.   On: 12/31/2014 11:12    Lab Results: Basic Metabolic Panel:  Recent Labs  01/05/15 1300 01/06/15 0552  NA 135 137  K 3.8 4.3  CL 96* 99*  CO2 32 29  GLUCOSE 103* 148*  BUN 13 15  CREATININE 0.92 0.73  CALCIUM 8.8* 8.9   Liver Function Tests:  Recent Labs  01/06/15 0552  AST 46*  ALT 26  ALKPHOS 57  BILITOT 1.2  PROT 7.0  ALBUMIN 3.4*     CBC:  Recent Labs  01/05/15 1300 01/06/15 0552  WBC 9.3 5.9  NEUTROABS 6.8  --   HGB 14.9 14.8  HCT 44.9 43.7  MCV 98.9 99.1  PLT 241 266    No results found for this or any previous visit (from the past 240 hour(s)).   Hospital Course: This is a 69 year old who came to the emergency department after having had multiple falls. He was found to be very unsteady. He was also hypoxic and wheezing. He has COPD at baseline. He has chronic atrial fibrillation but is not a candidate for anticoagulation because of multiple falls. He has had rib fractures and  compression fractures in the lumbar spine related to multiple falls. He has had problems with orthostasis and has come off of his medication for that. He was evaluated by physical therapy and it was felt that because of his multiple falls and his very unsteady gait and he was going to require skilled care facility rehabilitation and that has been arranged  Discharge Exam: Blood pressure 137/72, pulse 77, temperature 98.1 F (36.7 C), temperature source Oral, resp. rate 20, height 5\' 8"  (1.727 m), weight 63.504 kg (140 lb), SpO2 91 %. He is awake and alert. He is in atrial fibrillation. His chest is relatively clear.  Disposition: Transfer to skilled care facility. I have added treatment for COPD which he had discontinued at home. He will be back on medications for orthostasis. He will be on oxygen at least for now. He will have a heart healthy diet. He will be on prednisone 40 mg for 3 days then 30 mg for 3 days then 20 mg for 3 days then 10 mg for 3 days and then stop. He will have PT and OT and speech if needed.. I started him on treatment for osteoporosis because of his multiple fractures and he will need bone density determination that can be done as an outpatient      Discharge Instructions    Discharge to SNF when bed available    Complete by:  As directed              Signed: Maelyn Berrey L   01/07/2015, 10:44 AM

## 2015-01-07 NOTE — Progress Notes (Signed)
Discharged PT per MD order and protocol. Gave report to nurse receiving patient at [facility]. Pt verbalized understanding and left with all belongings. VSS. IV catheter D/C. Packet sent with patient.  Patient transported via EMS. Oswald Hillock, RN

## 2015-01-07 NOTE — Progress Notes (Signed)
Subjective: He says he feels okay. He is complaining of rib pain. His conversation is a little bit disjointed. He has no other new complaints.  Objective: Vital signs in last 24 hours: Temp:  [98 F (36.7 C)-98.2 F (36.8 C)] 98.1 F (36.7 C) (12/31 0630) Pulse Rate:  [63-73] 73 (12/31 0630) Resp:  [18-20] 20 (12/31 0630) BP: (113-136)/(65-70) 113/70 mmHg (12/31 0630) SpO2:  [91 %-94 %] 91 % (12/31 0727) Weight change:  Last BM Date: 01/05/15  Intake/Output from previous day: 12/30 0701 - 12/31 0700 In: 720 [P.O.:720] Out: 1300 [Urine:1300]  PHYSICAL EXAM General appearance: alert, cooperative and mild distress Resp: rhonchi bilaterally Cardio: regular rate and rhythm, S1, S2 normal, no murmur, click, rub or gallop GI: soft, non-tender; bowel sounds normal; no masses,  no organomegaly Extremities: extremities normal, atraumatic, no cyanosis or edema  Lab Results:  Results for orders placed or performed during the hospital encounter of 01/05/15 (from the past 48 hour(s))  CBC with Differential/Platelet     Status: None   Collection Time: 01/05/15  1:00 PM  Result Value Ref Range   WBC 9.3 4.0 - 10.5 K/uL   RBC 4.54 4.22 - 5.81 MIL/uL   Hemoglobin 14.9 13.0 - 17.0 g/dL   HCT 44.9 39.0 - 52.0 %   MCV 98.9 78.0 - 100.0 fL   MCH 32.8 26.0 - 34.0 pg   MCHC 33.2 30.0 - 36.0 g/dL   RDW 14.4 11.5 - 15.5 %   Platelets 241 150 - 400 K/uL   Neutrophils Relative % 74 %   Neutro Abs 6.8 1.7 - 7.7 K/uL   Lymphocytes Relative 15 %   Lymphs Abs 1.4 0.7 - 4.0 K/uL   Monocytes Relative 9 %   Monocytes Absolute 0.8 0.1 - 1.0 K/uL   Eosinophils Relative 2 %   Eosinophils Absolute 0.2 0.0 - 0.7 K/uL   Basophils Relative 0 %   Basophils Absolute 0.0 0.0 - 0.1 K/uL  Basic metabolic panel     Status: Abnormal   Collection Time: 01/05/15  1:00 PM  Result Value Ref Range   Sodium 135 135 - 145 mmol/L   Potassium 3.8 3.5 - 5.1 mmol/L   Chloride 96 (L) 101 - 111 mmol/L   CO2 32 22 - 32  mmol/L   Glucose, Bld 103 (H) 65 - 99 mg/dL   BUN 13 6 - 20 mg/dL   Creatinine, Ser 0.92 0.61 - 1.24 mg/dL   Calcium 8.8 (L) 8.9 - 10.3 mg/dL   GFR calc non Af Amer >60 >60 mL/min   GFR calc Af Amer >60 >60 mL/min    Comment: (NOTE) The eGFR has been calculated using the CKD EPI equation. This calculation has not been validated in all clinical situations. eGFR's persistently <60 mL/min signify possible Chronic Kidney Disease.    Anion gap 7 5 - 15  Troponin I     Status: None   Collection Time: 01/05/15  1:00 PM  Result Value Ref Range   Troponin I <0.03 <0.031 ng/mL    Comment:        NO INDICATION OF MYOCARDIAL INJURY.   CK     Status: None   Collection Time: 01/05/15  1:00 PM  Result Value Ref Range   Total CK 201 49 - 397 U/L  Blood gas, arterial     Status: Abnormal   Collection Time: 01/05/15  1:05 PM  Result Value Ref Range   FIO2 0.21    pH, Arterial  7.450 7.350 - 7.450   pCO2 arterial 42.7 35.0 - 45.0 mmHg   pO2, Arterial 62.0 (L) 80.0 - 100.0 mmHg   Bicarbonate 28.8 (H) 20.0 - 24.0 mEq/L   Acid-Base Excess 5.3 (H) 0.0 - 2.0 mmol/L   O2 Saturation 90.1 %   Patient temperature 37.0    Collection site RIGHT RADIAL    Drawn by [id number]    Sample type ARTERIAL DRAW    Allens test (pass/fail) PASS PASS  D-dimer, quantitative (not at [facility])     Status: Abnormal   Collection Time: 01/05/15  1:05 PM  Result Value Ref Range   D-Dimer, Quant 3.62 (H) 0.00 - 0.50 ug/mL-FEU    Comment: (NOTE) At the manufacturer cut-off of 0.50 ug/mL FEU, this assay has been documented to exclude PE with a sensitivity and negative predictive value of 97 to 99%.  At this time, this assay has not been approved by the FDA to exclude DVT/VTE. Results should be correlated with clinical presentation.   I-Stat CG4 Lactic Acid, ED     Status: None   Collection Time: 01/05/15  1:11 PM  Result Value Ref Range   Lactic Acid, Venous 1.24 0.5 - 2.0 mmol/L  POC occult blood, ED Provider will  collect     Status: None   Collection Time: 01/05/15  1:32 PM  Result Value Ref Range   Fecal Occult Bld NEGATIVE NEGATIVE  Urinalysis, Routine w reflex microscopic (not at [facility])     Status: Abnormal   Collection Time: 01/05/15  2:40 PM  Result Value Ref Range   Color, Urine AMBER (A) YELLOW    Comment: BIOCHEMICALS MAY BE AFFECTED BY COLOR   APPearance CLEAR CLEAR   Specific Gravity, Urine 1.025 1.005 - 1.030   pH 5.5 5.0 - 8.0   Glucose, UA NEGATIVE NEGATIVE mg/dL   Hgb urine dipstick NEGATIVE NEGATIVE   Bilirubin Urine MODERATE (A) NEGATIVE   Ketones, ur 15 (A) NEGATIVE mg/dL   Protein, ur NEGATIVE NEGATIVE mg/dL   Nitrite NEGATIVE NEGATIVE   Leukocytes, UA NEGATIVE NEGATIVE    Comment: MICROSCOPIC NOT DONE ON URINES WITH NEGATIVE PROTEIN, BLOOD, LEUKOCYTES, NITRITE, OR GLUCOSE <1000 mg/dL.  Comprehensive metabolic panel     Status: Abnormal   Collection Time: 01/06/15  5:52 AM  Result Value Ref Range   Sodium 137 135 - 145 mmol/L   Potassium 4.3 3.5 - 5.1 mmol/L   Chloride 99 (L) 101 - 111 mmol/L   CO2 29 22 - 32 mmol/L   Glucose, Bld 148 (H) 65 - 99 mg/dL   BUN 15 6 - 20 mg/dL   Creatinine, Ser 0.73 0.61 - 1.24 mg/dL   Calcium 8.9 8.9 - 10.3 mg/dL   Total Protein 7.0 6.5 - 8.1 g/dL   Albumin 3.4 (L) 3.5 - 5.0 g/dL   AST 46 (H) 15 - 41 U/L   ALT 26 17 - 63 U/L   Alkaline Phosphatase 57 38 - 126 U/L   Total Bilirubin 1.2 0.3 - 1.2 mg/dL   GFR calc non Af Amer >60 >60 mL/min   GFR calc Af Amer >60 >60 mL/min    Comment: (NOTE) The eGFR has been calculated using the CKD EPI equation. This calculation has not been validated in all clinical situations. eGFR's persistently <60 mL/min signify possible Chronic Kidney Disease.    Anion gap 9 5 - 15  CBC     Status: None   Collection Time: 01/06/15  5:52 AM  Result Value Ref  Range   WBC 5.9 4.0 - 10.5 K/uL   RBC 4.41 4.22 - 5.81 MIL/uL   Hemoglobin 14.8 13.0 - 17.0 g/dL   HCT 41.5 64.0 - 81.6 %   MCV 99.1 78.0 - 100.0  fL   MCH 33.6 26.0 - 34.0 pg   MCHC 33.9 30.0 - 36.0 g/dL   RDW 85.4 46.2 - 04.9 %   Platelets 266 150 - 400 K/uL    ABGS  Recent Labs  01/05/15 1305  PHART 7.450  PO2ART 62.0*  HCO3 28.8*   CULTURES No results found for this or any previous visit (from the past 240 hour(s)). Studies/Results: Dg Chest 2 View  01/05/2015  CLINICAL DATA:  Two falls in 2 days. Left chest pain, landing on chest. History of rib fractures. Shortness of breath and productive cough for 2 weeks. EXAM: CHEST  2 VIEW COMPARISON:  12/31/2014 FINDINGS: Mild elevation of the right hemidiaphragm. Mild cardiomegaly. No confluent airspace opacities or effusions. Mild cardiomegaly. Old left rib fractures are again noted, stable. No visible acute rib fractures. No pneumothorax. IMPRESSION: Old left rib fractures.  No visible new/ acute rib fractures. Mild cardiomegaly. Electronically Signed   By: Charlett Nose M.D.   On: 01/05/2015 15:30   Dg Lumbar Spine Complete  01/05/2015  CLINICAL DATA:  Pain following recent falls EXAM: LUMBAR SPINE - COMPLETE 4+ VIEW COMPARISON:  December 31, 2014 FINDINGS: Frontal, lateral, spot lumbosacral lateral, and bilateral oblique views were obtained. There are 5 non-rib-bearing lumbar type vertebral bodies. There is lumbar dextroscoliosis. Anterior wedge compression fractures of L1, L2, and L3 are stable in appearance. There is also mild apparent concavity along the superior endplates of T11 and T12, stable and likely due in part to the scoliosis. There is no new fracture. No spondylolisthesis. There is mild disc space narrowing at L4-5 and L5-S1 as well as at L1-2, stable. There is facet osteoarthritic change at L4-5 and L5-S1 bilaterally. IMPRESSION: Anterior wedge compression fractures at L1, L2, and L3 are stable compared to recent prior study. No new fracture. No spondylolisthesis. Stable scoliosis and areas of arthropathy. Electronically Signed   By: Bretta Bang III M.D.   On:  01/05/2015 15:13   Ct Angio Chest Pe W/cm &/or Wo Cm  01/05/2015  CLINICAL DATA:  Initial encounter for 2 week history of dyspnea and left anterior chest pain with hypoxia. Increased D-dimer. EXAM: CT ANGIOGRAPHY CHEST WITH CONTRAST TECHNIQUE: Multidetector CT imaging of the chest was performed using the standard protocol during bolus administration of intravenous contrast. Multiplanar CT image reconstructions and MIPs were obtained to evaluate the vascular anatomy. CONTRAST:  OMNIPAQUE IOHEXOL 350 MG/ML SOLN COMPARISON:  08/15/2011. FINDINGS: Mediastinum / Lymph Nodes: There is no axillary lymphadenopathy. Scattered small lymph nodes in the mediastinum do not meet CT criteria for pathologic enlargement. There is no hilar lymphadenopathy. The esophagus has normal imaging features. The heart size is normal. No pericardial effusion. Coronary artery calcification is noted. No filling defect in the opacified pulmonary arteries to suggest the presence of an acute pulmonary embolus. No evidence for dissection flap in the thoracic aorta. Lungs / Pleura: There is some mucus identified in the mid to distal trachea. Subsegmental atelectasis evident in the lower lobes bilaterally. No pulmonary edema or pleural effusion. No suspicious pulmonary nodule or mass. No focal airspace consolidation. Upper Abdomen:  Unremarkable. MSK / Soft Tissues: Bone windows reveal no worrisome lytic or sclerotic osseous lesions. Review of the MIP images confirms the above findings.  IMPRESSION: 1. No CT evidence for acute pulmonary embolus. 2. Dependent subsegmental atelectasis in the lungs bilaterally. 3. No specific findings to explain the patient's history of difficulty breathing. Electronically Signed   By: Misty Stanley M.D.   On: 01/05/2015 17:56    Medications:  Prior to Admission:  Prescriptions prior to admission  Medication Sig Dispense Refill Last Dose  . albuterol (PROVENTIL HFA) 108 (90 BASE) MCG/ACT inhaler Inhale 2  puffs into the lungs every 6 (six) hours as needed for wheezing or shortness of breath.   01/05/2015 at Unknown time  . ALPRAZolam (XANAX) 1 MG tablet Take 1 mg by mouth 3 (three) times daily as needed. For anxiety   01/05/2015 at Unknown time  . amLODipine (NORVASC) 5 MG tablet Take 5 mg by mouth daily.   01/05/2015 at Unknown time  . atorvastatin (LIPITOR) 40 MG tablet Take 40 mg by mouth at bedtime.    01/04/2015 at Unknown time  . levETIRAcetam (KEPPRA) 1000 MG tablet Take 1,000 mg by mouth 3 (three) times daily.   01/05/2015 at Unknown time  . nitroGLYCERIN (NITROSTAT) 0.4 MG SL tablet Place 0.4 mg under the tongue every 5 (five) minutes as needed. Chest pain   unknown  . pantoprazole (PROTONIX) 40 MG tablet Take 40 mg by mouth daily.     01/05/2015 at Unknown time  . potassium chloride SA (K-DUR,KLOR-CON) 20 MEQ tablet Take 20 mEq by mouth 2 (two) times daily.   01/05/2015 at Unknown time  . traMADol (ULTRAM) 50 MG tablet Take 50 mg by mouth 4 (four) times daily.   01/05/2015 at Unknown time  . Valproic Acid (DEPAKENE) 250 MG/5ML SYRP syrup Take 250 mg by mouth 2 (two) times daily.    01/05/2015 at Unknown time   Scheduled: . albuterol  3 mL Inhalation TID  . amLODipine  5 mg Oral Daily  . atorvastatin  40 mg Oral QHS  . enoxaparin (LOVENOX) injection  40 mg Subcutaneous Q24H  . levETIRAcetam  1,000 mg Oral TID  . methylPREDNISolone (SOLU-MEDROL) injection  80 mg Intravenous Q12H  . midodrine  5 mg Oral TID WC  . pantoprazole  40 mg Oral Daily  . potassium chloride SA  20 mEq Oral BID  . sodium chloride  3 mL Intravenous Q12H  . Valproic Acid  250 mg Oral BID   Continuous:  [id number], ALPRAZolam, nitroGLYCERIN, ondansetron **OR** ondansetron (ZOFRAN) IV, oxyCODONE  Assesment: He was admitted with COPD exacerbation. He's had multiple falls. He did not show documented orthostasis but that has been a significant problem in the past so I'm going to leave him on treatment for that. It  has been recommended that he have skilled care facility and I'm going to discharge him to the skilled care facility today. His breathing is doing better. I'm going to put him on something for osteoporosis because he had multiple fractures and he will need a bone density done but that can be done as an outpatient Active Problems:   Essential hypertension   Atrial fibrillation ([place])   COPD exacerbation ([place])   Falls    Plan: Transfer to skilled care facility today    LOS: 2 days   Michaline Kindig L 01/07/2015, 10:14 AM

## 2015-01-10 ENCOUNTER — Inpatient Hospital Stay (HOSPITAL_COMMUNITY)
Admission: EM | Admit: 2015-01-10 | Discharge: 2015-01-12 | DRG: 871 | Disposition: A | Discharge status: Skilled Nursing Facility | Payer: Commercial Managed Care - HMO | Source: Skilled Nursing Facility | Attending: Internal Medicine | Admitting: Internal Medicine

## 2015-01-10 ENCOUNTER — Emergency Department (HOSPITAL_COMMUNITY): Payer: Commercial Managed Care - HMO

## 2015-01-10 ENCOUNTER — Encounter (HOSPITAL_COMMUNITY): Payer: Self-pay | Admitting: Emergency Medicine

## 2015-01-10 DIAGNOSIS — J449 Chronic obstructive pulmonary disease, unspecified: Secondary | ICD-10-CM | POA: Diagnosis not present

## 2015-01-10 DIAGNOSIS — I10 Essential (primary) hypertension: Secondary | ICD-10-CM | POA: Diagnosis not present

## 2015-01-10 DIAGNOSIS — I248 Other forms of acute ischemic heart disease: Secondary | ICD-10-CM | POA: Diagnosis present

## 2015-01-10 DIAGNOSIS — J019 Acute sinusitis, unspecified: Secondary | ICD-10-CM | POA: Diagnosis present

## 2015-01-10 DIAGNOSIS — G9389 Other specified disorders of brain: Secondary | ICD-10-CM | POA: Diagnosis not present

## 2015-01-10 DIAGNOSIS — I2510 Atherosclerotic heart disease of native coronary artery without angina pectoris: Secondary | ICD-10-CM | POA: Diagnosis not present

## 2015-01-10 DIAGNOSIS — F3160 Bipolar disorder, current episode mixed, unspecified: Secondary | ICD-10-CM | POA: Diagnosis present

## 2015-01-10 DIAGNOSIS — Z9181 History of falling: Secondary | ICD-10-CM

## 2015-01-10 DIAGNOSIS — R296 Repeated falls: Secondary | ICD-10-CM | POA: Diagnosis present

## 2015-01-10 DIAGNOSIS — R4182 Altered mental status, unspecified: Secondary | ICD-10-CM | POA: Diagnosis not present

## 2015-01-10 DIAGNOSIS — R569 Unspecified convulsions: Secondary | ICD-10-CM | POA: Diagnosis not present

## 2015-01-10 DIAGNOSIS — R509 Fever, unspecified: Secondary | ICD-10-CM | POA: Diagnosis not present

## 2015-01-10 DIAGNOSIS — I69354 Hemiplegia and hemiparesis following cerebral infarction affecting left non-dominant side: Secondary | ICD-10-CM | POA: Diagnosis not present

## 2015-01-10 DIAGNOSIS — A419 Sepsis, unspecified organism: Principal | ICD-10-CM | POA: Diagnosis present

## 2015-01-10 DIAGNOSIS — J440 Chronic obstructive pulmonary disease with acute lower respiratory infection: Secondary | ICD-10-CM | POA: Diagnosis present

## 2015-01-10 DIAGNOSIS — J189 Pneumonia, unspecified organism: Secondary | ICD-10-CM | POA: Diagnosis present

## 2015-01-10 DIAGNOSIS — F17210 Nicotine dependence, cigarettes, uncomplicated: Secondary | ICD-10-CM | POA: Diagnosis present

## 2015-01-10 DIAGNOSIS — Y95 Nosocomial condition: Secondary | ICD-10-CM | POA: Diagnosis present

## 2015-01-10 DIAGNOSIS — I4891 Unspecified atrial fibrillation: Secondary | ICD-10-CM | POA: Diagnosis not present

## 2015-01-10 DIAGNOSIS — G40909 Epilepsy, unspecified, not intractable, without status epilepticus: Secondary | ICD-10-CM | POA: Diagnosis not present

## 2015-01-10 DIAGNOSIS — Z8673 Personal history of transient ischemic attack (TIA), and cerebral infarction without residual deficits: Secondary | ICD-10-CM | POA: Diagnosis not present

## 2015-01-10 DIAGNOSIS — E7800 Pure hypercholesterolemia, unspecified: Secondary | ICD-10-CM | POA: Diagnosis not present

## 2015-01-10 DIAGNOSIS — R0902 Hypoxemia: Secondary | ICD-10-CM | POA: Diagnosis present

## 2015-01-10 DIAGNOSIS — F319 Bipolar disorder, unspecified: Secondary | ICD-10-CM | POA: Diagnosis not present

## 2015-01-10 DIAGNOSIS — I482 Chronic atrial fibrillation: Secondary | ICD-10-CM | POA: Diagnosis not present

## 2015-01-10 DIAGNOSIS — I481 Persistent atrial fibrillation: Secondary | ICD-10-CM | POA: Diagnosis not present

## 2015-01-10 DIAGNOSIS — F3162 Bipolar disorder, current episode mixed, moderate: Secondary | ICD-10-CM | POA: Diagnosis present

## 2015-01-10 HISTORY — DX: Unspecified atrial fibrillation: I48.91

## 2015-01-10 HISTORY — DX: Headache, unspecified: R51.9

## 2015-01-10 HISTORY — DX: Chronic obstructive pulmonary disease, unspecified: J44.9

## 2015-01-10 HISTORY — DX: Headache: R51

## 2015-01-10 HISTORY — DX: Bipolar disorder, unspecified: F31.9

## 2015-01-10 LAB — URINALYSIS, ROUTINE W REFLEX MICROSCOPIC
Bilirubin Urine: NEGATIVE
GLUCOSE, UA: 100 mg/dL — AB
HGB URINE DIPSTICK: NEGATIVE
KETONES UR: NEGATIVE mg/dL
Leukocytes, UA: NEGATIVE
Nitrite: NEGATIVE
PROTEIN: NEGATIVE mg/dL
Specific Gravity, Urine: 1.026 (ref 1.005–1.030)
pH: 6 (ref 5.0–8.0)

## 2015-01-10 LAB — COMPREHENSIVE METABOLIC PANEL
ALT: 24 U/L (ref 17–63)
AST: 26 U/L (ref 15–41)
Albumin: 3.1 g/dL — ABNORMAL LOW (ref 3.5–5.0)
Alkaline Phosphatase: 73 U/L (ref 38–126)
Anion gap: 8 (ref 5–15)
BILIRUBIN TOTAL: 0.9 mg/dL (ref 0.3–1.2)
BUN: 15 mg/dL (ref 6–20)
CO2: 26 mmol/L (ref 22–32)
Calcium: 8.9 mg/dL (ref 8.9–10.3)
Chloride: 106 mmol/L (ref 101–111)
Creatinine, Ser: 0.88 mg/dL (ref 0.61–1.24)
GFR calc Af Amer: >60 mL/min (ref >60)
Glucose, Bld: 117 mg/dL — ABNORMAL HIGH (ref 65–99)
POTASSIUM: 4.7 mmol/L (ref 3.5–5.1)
Sodium: 140 mmol/L (ref 135–145)
TOTAL PROTEIN: 6.3 g/dL — AB (ref 6.5–8.1)

## 2015-01-10 LAB — CBG MONITORING, ED: GLUCOSE-CAPILLARY: 114 mg/dL — AB (ref 65–99)

## 2015-01-10 LAB — I-STAT CHEM 8, ED
BUN: 23 mg/dL — ABNORMAL HIGH (ref 6–20)
CHLORIDE: 104 mmol/L (ref 101–111)
Calcium, Ion: 1.11 mmol/L — ABNORMAL LOW (ref 1.13–1.30)
Creatinine, Ser: 0.9 mg/dL (ref 0.61–1.24)
Glucose, Bld: 115 mg/dL — ABNORMAL HIGH (ref 65–99)
HEMATOCRIT: 50 % (ref 39.0–52.0)
Hemoglobin: 17 g/dL (ref 13.0–17.0)
POTASSIUM: 4.5 mmol/L (ref 3.5–5.1)
SODIUM: 139 mmol/L (ref 135–145)
TCO2: 26 mmol/L (ref 0–100)

## 2015-01-10 LAB — TROPONIN I: Troponin I: 0.08 ng/mL — ABNORMAL HIGH (ref <0.031)

## 2015-01-10 LAB — CBC WITH DIFFERENTIAL/PLATELET
BASOS ABS: 0 10*3/uL (ref 0.0–0.1)
Basophils Relative: 0 %
EOS ABS: 0 10*3/uL (ref 0.0–0.7)
EOS PCT: 0 %
HCT: 45.1 % (ref 39.0–52.0)
Hemoglobin: 15.3 g/dL (ref 13.0–17.0)
LYMPHS PCT: 17 %
Lymphs Abs: 2.3 10*3/uL (ref 0.7–4.0)
MCH: 32.9 pg (ref 26.0–34.0)
MCHC: 33.9 g/dL (ref 30.0–36.0)
MCV: 97 fL (ref 78.0–100.0)
Monocytes Absolute: 0.9 10*3/uL (ref 0.1–1.0)
Monocytes Relative: 6 %
Neutro Abs: 10.3 10*3/uL — ABNORMAL HIGH (ref 1.7–7.7)
Neutrophils Relative %: 77 %
PLATELETS: 205 10*3/uL (ref 150–400)
RBC: 4.65 MIL/uL (ref 4.22–5.81)
RDW: 14.9 % (ref 11.5–15.5)
WBC: 13.5 10*3/uL — AB (ref 4.0–10.5)

## 2015-01-10 LAB — I-STAT CG4 LACTIC ACID, ED
Lactic Acid, Venous: 1.08 mmol/L (ref 0.5–2.0)
Lactic Acid, Venous: 1.15 mmol/L (ref 0.5–2.0)

## 2015-01-10 LAB — I-STAT TROPONIN, ED: TROPONIN I, POC: 0.04 ng/mL (ref 0.00–0.08)

## 2015-01-10 MED ORDER — SODIUM CHLORIDE 0.9 % IV BOLUS (SEPSIS)
1000.0000 mL | Freq: Once | INTRAVENOUS | Status: AC
  Administered 2015-01-11: 1000 mL via INTRAVENOUS

## 2015-01-10 MED ORDER — SODIUM CHLORIDE 0.9 % IV BOLUS (SEPSIS)
1000.0000 mL | Freq: Once | INTRAVENOUS | Status: AC
  Administered 2015-01-10: 1000 mL via INTRAVENOUS

## 2015-01-10 MED ORDER — VANCOMYCIN HCL IN DEXTROSE 1-5 GM/200ML-% IV SOLN
1000.0000 mg | Freq: Once | INTRAVENOUS | Status: AC
  Administered 2015-01-10: 1000 mg via INTRAVENOUS
  Filled 2015-01-10: qty 200

## 2015-01-10 MED ORDER — ASPIRIN 81 MG PO CHEW
324.0000 mg | CHEWABLE_TABLET | Freq: Once | ORAL | Status: AC
  Administered 2015-01-11: 324 mg via ORAL
  Filled 2015-01-10: qty 4

## 2015-01-10 MED ORDER — PIPERACILLIN-TAZOBACTAM 3.375 G IVPB 30 MIN
3.3750 g | Freq: Once | INTRAVENOUS | Status: AC
  Administered 2015-01-10: 3.375 g via INTRAVENOUS
  Filled 2015-01-10: qty 50

## 2015-01-10 NOTE — ED Provider Notes (Signed)
CSN: [id number]     Arrival date & time 01/10/15  1947 History   First MD Initiated Contact with Patient 01/10/15 2010     Chief Complaint  Patient presents with  . Chest Pain  . Altered Mental Status   70 yo M w/PMH of CAD, stroke, HTN, and sz who presents by EMS from nursing facility for AMS, CP, and left-sided deficits. Patient seemed to be confused and was complaining of some mild chest pain. Of note he was just discharged from our hospital for failure to thrive and increasing falls. Patient endorses that he has left-sided chest pain as well as anterior chest pain, mild, nonradiating. He says he has a bruise where his chest hurts. He denies any falls, fevers, chills, nausea, vomiting, back pain, shortness of breath, abdominal pain.    (Consider location/radiation/quality/duration/timing/severity/associated sxs/prior Treatment) Patient is a 70 y.o. male presenting with altered mental status.  Altered Mental Status Presenting symptoms: confusion   Severity:  Moderate Most recent episode:  Today Timing:  Constant Progression:  Unchanged Chronicity:  New Associated symptoms: no abdominal pain, no fever, no headaches, no light-headedness, no nausea, no palpitations and no vomiting   Associated symptoms comment:  Left sided weakness/deficits and AMS   Past Medical History  Diagnosis Date  . Heart disease   . CAD (coronary artery disease)   . Stroke [facility])     left arm weakness  . Seizures ([place])   . Depression   . Hypertension    Past Surgical History  Procedure Laterality Date  . Ankle fracture surgery     Family History  Problem Relation Age of Onset  . Cancer Mother   . Cancer Father    Social History  Substance Use Topics  . Smoking status: Current Every Day Smoker -- 0.50 packs/day for 50 years    Types: Cigarettes  . Smokeless tobacco: Never Used  . Alcohol Use: No    Review of Systems  Constitutional: Negative for fever and chills.  Respiratory: Negative for  shortness of breath.   Cardiovascular: Positive for chest pain. Negative for palpitations and leg swelling.  Gastrointestinal: Negative for nausea, vomiting, abdominal pain, diarrhea, constipation and abdominal distention.  Genitourinary: Negative for dysuria, frequency, flank pain and decreased urine volume.  Neurological: Negative for dizziness, speech difficulty, light-headedness and headaches.  Psychiatric/Behavioral: Positive for confusion.  All other systems reviewed and are negative.     Allergies  Meperidine hcl  Home Medications   Prior to Admission medications   Medication Sig Start Date End Date Taking? Authorizing Provider  albuterol (PROVENTIL) (2.5 MG/3ML) 0.083% nebulizer solution Take 3 mLs (2.5 mg total) by nebulization every 2 (two) hours as needed for wheezing or shortness of breath. 01/07/15  Yes Sinda Du, MD  alendronate (FOSAMAX) 10 MG tablet Take 4 tablets (40 mg total) by mouth every 7 (seven) days. Take with a full glass of water on an empty stomach. 01/08/15  Yes Sinda Du, MD  ALPRAZolam Duanne Moron) 1 MG tablet Take 1 mg by mouth 3 (three) times daily as needed. For anxiety   Yes Historical Provider, MD  amLODipine (NORVASC) 5 MG tablet Take 5 mg by mouth daily.   Yes Historical Provider, MD  atorvastatin (LIPITOR) 40 MG tablet Take 40 mg by mouth at bedtime.    Yes Historical Provider, MD  budesonide-formoterol (SYMBICORT) 160-4.5 MCG/ACT inhaler Inhale 2 puffs into the lungs 2 (two) times daily. 01/07/15  Yes Sinda Du, MD  Cholecalciferol (VITAMIN D) 2000 units CAPS  Take 1 capsule by mouth daily.   Yes Historical Provider, MD  levETIRAcetam (KEPPRA) 1000 MG tablet Take 1,000 mg by mouth 3 (three) times daily.   Yes Historical Provider, MD  midodrine (PROAMATINE) 5 MG tablet Take 1 tablet (5 mg total) by mouth 3 (three) times daily with meals. 01/07/15  Yes Sinda Du, MD  oxyCODONE (OXY IR/ROXICODONE) 5 MG immediate release tablet Take 1 tablet  (5 mg total) by mouth every 4 (four) hours as needed for moderate pain. 01/07/15  Yes Sinda Du, MD  pantoprazole (PROTONIX) 40 MG tablet Take 40 mg by mouth daily.     Yes Historical Provider, MD  potassium chloride SA (K-DUR,KLOR-CON) 20 MEQ tablet Take 20 mEq by mouth 3 (three) times daily.    Yes Historical Provider, MD  predniSONE (DELTASONE) 20 MG tablet Take 2 tablets (40 mg total) by mouth daily with breakfast. 01/07/15  Yes Sinda Du, MD  tiotropium (SPIRIVA) 18 MCG inhalation capsule Place 1 capsule (18 mcg total) into inhaler and inhale daily. 01/07/15  Yes Sinda Du, MD  albuterol (PROVENTIL HFA) 108 (90 BASE) MCG/ACT inhaler Inhale 2 puffs into the lungs every 6 (six) hours as needed for wheezing or shortness of breath.    Historical Provider, MD  nitroGLYCERIN (NITROSTAT) 0.4 MG SL tablet Place 0.4 mg under the tongue every 5 (five) minutes as needed. Chest pain    Historical Provider, MD  Valproic Acid (DEPAKENE) 250 MG/5ML SYRP syrup Take 250 mg by mouth 2 (two) times daily.     Historical Provider, MD   BP 131/81 mmHg  Pulse 72  Temp(Src) 102.1 F (38.9 C) (Rectal)  Resp 22  SpO2 96% Physical Exam  Constitutional: He appears well-developed and well-nourished. No distress.  HENT:  Head: Normocephalic and atraumatic.  Eyes: Pupils are equal, round, and reactive to light.  Neck: Normal range of motion.  Cardiovascular: Normal rate, regular rhythm, normal heart sounds and intact distal pulses.  Exam reveals no gallop and no friction rub.   No murmur heard. Pulmonary/Chest: Effort normal and breath sounds normal. No respiratory distress. He has no wheezes. He has no rales. He exhibits no tenderness.  Abdominal: Soft. Bowel sounds are normal. He exhibits no distension and no mass. There is no tenderness. There is no rebound and no guarding.  Musculoskeletal: Normal range of motion.  Lymphadenopathy:    He has no cervical adenopathy.  Neurological: He is alert. No  cranial nerve deficit or sensory deficit. Coordination normal. GCS eye subscore is 4. GCS verbal subscore is 5. GCS motor subscore is 6.  Confused Left sided weakness, no facial droop.  Skin: Skin is warm and dry. He is not diaphoretic.  Nursing note and vitals reviewed.   ED Course  Procedures (including critical care time) Labs Review Labs Reviewed  COMPREHENSIVE METABOLIC PANEL - Abnormal; Notable for the following:    Glucose, Bld 117 (*)    Total Protein 6.3 (*)    Albumin 3.1 (*)    All other components within normal limits  CBC WITH DIFFERENTIAL/PLATELET - Abnormal; Notable for the following:    WBC 13.5 (*)    Neutro Abs 10.3 (*)    All other components within normal limits  URINALYSIS, ROUTINE W REFLEX MICROSCOPIC (NOT AT [facility]) - Abnormal; Notable for the following:    Glucose, UA 100 (*)    All other components within normal limits  TROPONIN I - Abnormal; Notable for the following:    Troponin I 0.08 (*)  All other components within normal limits  I-STAT CHEM 8, ED - Abnormal; Notable for the following:    BUN 23 (*)    Glucose, Bld 115 (*)    Calcium, Ion 1.11 (*)    All other components within normal limits  CBG MONITORING, ED - Abnormal; Notable for the following:    Glucose-Capillary 114 (*)    All other components within normal limits  CULTURE, BLOOD (ROUTINE X 2)  CULTURE, BLOOD (ROUTINE X 2)  URINE CULTURE  I-STAT CG4 LACTIC ACID, ED  I-STAT TROPOININ, ED  I-STAT CG4 LACTIC ACID, ED    Imaging Review Ct Head Wo Contrast  01/10/2015  CLINICAL DATA:  Altered mental status. Left-sided deficits. Chest pain. EXAM: CT HEAD WITHOUT CONTRAST TECHNIQUE: Contiguous axial images were obtained from the base of the skull through the vertex without intravenous contrast. COMPARISON:  Head CT 12/31/2014 FINDINGS: Sequela of remote right MCA distribution infarct again seen with multifocal encephalomalacia and ex vacuo dilatation of right lateral ventricle, unchanged in  appearance. Generalized atrophy and chronic small vessel ischemic change. No acute hemorrhage, cerebral edema or midline shift. No evidence of acute ischemia. Chronic opacification of left maxillary sinus, with progressive mucosal thickening of the remaining paranasal sinuses. No calvarial fracture. Posterior scalp staples are noted. IMPRESSION: 1. Remote right MCA distribution infarct, unchanged from prior. No acute intracranial abnormality. 2. Increased inflammatory change in the paranasal sinuses from prior. Electronically Signed   By: Jeb Levering M.D.   On: 01/10/2015 22:41   Dg Chest Portable 1 View  01/10/2015  CLINICAL DATA:  Acute onset of generalized chest pain, fever and altered mental status. Initial encounter. EXAM: PORTABLE CHEST 1 VIEW COMPARISON:  Chest radiograph and CTA of the chest performed 01/05/2015 FINDINGS: The lungs are well-aerated and clear. There is no evidence of focal opacification, pleural effusion or pneumothorax. The right lung apex is partially obscured by the patient's head. The cardiomediastinal silhouette is borderline enlarged. No acute osseous abnormalities are seen. IMPRESSION: Borderline cardiomegaly.  Lungs remain grossly clear. Electronically Signed   By: Garald Balding M.D.   On: 01/10/2015 22:05   I have personally reviewed and evaluated these images and lab results as part of my medical decision-making.   EKG Interpretation   Date/Time:  Tuesday January 10 2015 20:06:46 EST Ventricular Rate:  72 PR Interval:  139 QRS Duration: 96 QT Interval:  367 QTC Calculation: 402 R Axis:   82 Text Interpretation:  Sinus rhythm Atrial premature complexes Borderline  right axis deviation Borderline T abnormalities, anterior leads No  significant change since last tracing Confirmed by Maryan Rued  MD, Loree Fee  [phone]) on 01/10/2015 9:40:28 PM      MDM   Final diagnoses:  Fever, unspecified fever cause   70 year old male here with altered mental status. See  history of present illness for details. Febrile at 102 remainder vital stable. Lungs clear, abdomen soft, no physical sign of trauma. Given fever and altered mental status concern for infection. White count elevated at 13.5. Lactic acid normal. Chest x-ray benign. CT head with no acute intracranial abnormality.   After fluids patient mentation clearing. Likely an early infection, possibly pneumonia. Covered with broad-spectrum antibiotics.  Pt now much more clear/oriented. No longer complaining of CP. Trop elevated: 0.08, but no EKG changes. Given ASA. Will need to trend.   AMS 2/2 fever? Pt has no obvious infectious source aside from intermittent hypoxia in the low 90s. Early PNA? No abd pain, no neck pain. Considered,  but not c/w appendicitis, cholecystitis, pyleo, meningitis, encephalitis. He does have mild left sided weakness, but unclear if this is new. CT head w/no deficits, but will obtain MRI brain to better evaluate.  Admit to hospital for continued treatment.    Pt was seen under the supervision of Dr. Maryan Rued.     Sherian Maroon, MD 01/10/15 2352  Blanchie Dessert, MD 01/10/15 [id number]

## 2015-01-10 NOTE — ED Notes (Signed)
Per EMS, pt from [facility] at [place]. Here for altered mental status and chest pain. Pt has hx left side deficits. Pt also has c/o mild chest pain. BP-118/76, CBG-136, HR-80, and O2-95%

## 2015-01-10 NOTE — H&P (Signed)
History and Physical  Patient Name: Jeffrey Sweeney     [id number]    DOB: [birth year]    DOA: 01/10/2015 Referring physician: Sherian Maroon, MD PCP: Alonza Bogus, MD      Chief Complaint: From nursing home  HPI: Jeffrey Sweeney is a 70 y.o. male with a past medical history significant for HTN, CAD, COPD, Bipolar, Seizures, and AF not on warfarin who presents with left sided weakness.  The patient was recently admitted at [facility] for COPD exacerbation, treated with steroids, and discharged to a nursing home three days ago.  Today, the nursing home called EMS to transport the patient to the ER because of fever and left sided weakness.    In the ED, the patient had fever to 102F, tachycpnea, leukocytosis, and intermittent hypoxia.  The troponin was minimally elevated, the lactic acid level was normal. A chest x-ray was clear and a CT angiogram 4 days ago (when he was admitted at [facility]) was negative for PE or pulmonary opacity.  Blood cultures and urine cultures were drawn, the patient was started on broad-spectrum antibiotics, and [facility] was asked to admit.  The patient complained of chest congestion and cough. He also noted left-sided weakness, which he reports had completely resolved after his stroke.     Review of Systems:  Pt complains of chest congestion, cough, shortness of breath, chest discomfort, left sided weakness. Pt denies any confusion, neck pain, headache, photophobia, abdominal pain, diarrhea, dysuria, hematuria.  All other systems negative except as just noted or noted in the history of present illness.  Allergies  Allergen Reactions  . Meperidine Hcl Nausea And Vomiting    Prior to Admission medications   Medication Sig Start Date End Date Taking? Authorizing Provider  albuterol (PROVENTIL) (2.5 MG/3ML) 0.083% nebulizer solution Take 3 mLs (2.5 mg total) by nebulization every 2 (two) hours as needed for wheezing or shortness of breath. 01/07/15  Yes Sinda Du, MD  alendronate (FOSAMAX) 10 MG tablet Take 4 tablets (40 mg total) by mouth every 7 (seven) days. Take with a full glass of water on an empty stomach. 01/08/15  Yes Sinda Du, MD  ALPRAZolam Duanne Moron) 1 MG tablet Take 1 mg by mouth 3 (three) times daily as needed. For anxiety   Yes Historical Provider, MD  amLODipine (NORVASC) 5 MG tablet Take 5 mg by mouth daily.   Yes Historical Provider, MD  atorvastatin (LIPITOR) 40 MG tablet Take 40 mg by mouth at bedtime.    Yes Historical Provider, MD  budesonide-formoterol (SYMBICORT) 160-4.5 MCG/ACT inhaler Inhale 2 puffs into the lungs 2 (two) times daily. 01/07/15  Yes Sinda Du, MD  Cholecalciferol (VITAMIN D) 2000 units CAPS Take 1 capsule by mouth daily.   Yes Historical Provider, MD  levETIRAcetam (KEPPRA) 1000 MG tablet Take 1,000 mg by mouth 3 (three) times daily.   Yes Historical Provider, MD  midodrine (PROAMATINE) 5 MG tablet Take 1 tablet (5 mg total) by mouth 3 (three) times daily with meals. 01/07/15  Yes Sinda Du, MD  oxyCODONE (OXY IR/ROXICODONE) 5 MG immediate release tablet Take 1 tablet (5 mg total) by mouth every 4 (four) hours as needed for moderate pain. 01/07/15  Yes Sinda Du, MD  pantoprazole (PROTONIX) 40 MG tablet Take 40 mg by mouth daily.     Yes Historical Provider, MD  potassium chloride SA (K-DUR,KLOR-CON) 20 MEQ tablet Take 20 mEq by mouth 3 (three) times daily.    Yes Historical Provider, MD  predniSONE (DELTASONE) 20 MG tablet Take 2 tablets (40 mg total) by mouth daily with breakfast. 01/07/15  Yes Sinda Du, MD  tiotropium (SPIRIVA) 18 MCG inhalation capsule Place 1 capsule (18 mcg total) into inhaler and inhale daily. 01/07/15  Yes Sinda Du, MD  albuterol (PROVENTIL HFA) 108 (90 BASE) MCG/ACT inhaler Inhale 2 puffs into the lungs every 6 (six) hours as needed for wheezing or shortness of breath.    Historical Provider, MD  nitroGLYCERIN (NITROSTAT) 0.4 MG SL tablet Place 0.4 mg under  the tongue every 5 (five) minutes as needed. Chest pain    Historical Provider, MD  Valproic Acid (DEPAKENE) 250 MG/5ML SYRP syrup Take 250 mg by mouth 2 (two) times daily.     Historical Provider, MD    Past Medical History  Diagnosis Date  . Heart disease   . CAD (coronary artery disease)   . Stroke [facility])     left arm weakness  . Seizures ([place])   . Depression   . Hypertension     Past Surgical History  Procedure Laterality Date  . Ankle fracture surgery      Family history: family history includes Cancer in his father and mother.  Social History: Patient lives in rehab currently.  He is able to walk with a walker.  He is a smoker.        Physical Exam: BP 123/71 mmHg  Pulse 81  Temp(Src) 102.1 F (38.9 C) (Rectal)  Resp 14  SpO2 96% General appearance: Frail elderly  male, awake but somewhat confused.   Eyes: Anicteric, conjunctiva pink, lids and lashes normal.     ENT: No nasal deformity or epistaxis.  Clear nasal discharge.  Edentulous.  OP moist without lesions.   Lymph: No cervical, supraclavicular lymphadenopathy. Skin: Warm and dry.  No mottling. Cardiac: Tachcyardic, regular, nl S1-S2, no murmurs appreciated.  Capillary refill is brisk.  JVP not visible.  No LE edema.   Respiratory: Normal respiratory rate and rhythm.  No wheezes.  Diminished at Right base. Abdomen: Abdomen soft without rigidity.  No TTP. No ascites, distension.   MSK: No deformities or effusions. Neuro: Left sided weakness.  Cranial nerves normal.  Oriented to month, date, place, and situation actually, but tangential and attention wanders.  Speech is fluent.    Psych: Behavior appropriate.  Affect normal.  No evidence of aural or visual hallucinations or delusions.       Labs on Admission:  The metabolic panel shows normal sodium, potassium, bicarbonate, and renal function. Transaminases and bilirubin are normal. The troponin is minimally elevated. The lactic acid level is  normal. Urinalysis is clear. The complete blood count shows leukocytosis.   Radiological Exams on Admission: Personally reviewed: Dg Chest Portable 1 View Patchy opacities in RL base.  Ct Head Wo Contrast 01/10/2015 NAICP Sinusitis.    EKG: Independently reviewed. Sinus rate 72.  QTc 402.  Anterior TW flattening.      Assessment/Plan 1. Sepsis from suspected pneumonia:  This is new.  Suspected source pneumonia. Organism unknown. Patient meets criteria given tachypnea, fever, leukocytosis, and evidence of organ dysfunction (altered mental status).  Blood and urine cultures drawn.  Lactate normal.  MAP > 65 mmHg. -Antibiotics delivered in the ED.   -Will continue cefepime and vancomycin -If MRSA swab negative, discontinue vancomycin -30 ml/kg bolus given in ED -Telemetry -Respiratory virus panel and droplet precautions -Sputum culture and urine antigens are ordered    2. Left sided weakness:  This is new,  per patient, had completely resolved.  Suspect anamnestic response.  Low suspicion for meningitis. -Repeat MRI, consult Neurology and stroke protocol if abnormal  3. AF:  CHADS2Vasc 2.  Not on warfarin due to recurrent falls trauma.  Rate controlled without medicine.  4. HTN:  Unclear why the patient is on midodrine and amlodipine both -Hold amlodipine -Continue midodrine until sepsis resolves  5. COPD:  Stable.  -Continue home breathing treatments -Albuterol as needed -Will discontinue prednisone started during last hospitalization due to pneumonia  6. Seizures Stable.  -Continue home levetiracetam and Depakote.  7. Elevated troponin: Suspect demand ischemia in sepsis.   -Trend troponin and consult Cardiology if rising.     DVT PPx: Lovenox Diet: Regular Consultants: None Code Status: Full Family Communication: None Medical decision making: What exists of the patient's previous chart was reviewed in depth and the case was discussed with Dr.  Tamala Julian Patient seen 11:56 PM on 01/10/2015.  Disposition Plan:  Initially admitted to stepdown given sepsis syndrome, but observed in ER overnight and symptoms improving with treatment of fever.  Will admit to telemetry for sepsis with normal lactic acid and resolving after observation in the ER overnight.  Follow cultures.  Narrow antibiotics as able.      Edwin Dada [facility] Pager [phone]

## 2015-01-10 NOTE — ED Notes (Signed)
Pt given ice water with Dr.Palumbo's permission

## 2015-01-10 NOTE — ED Notes (Signed)
Son Rasheen Nicklaus- [phone] Daughter [phone] ([phone] Please call with updates and to let them know what is going on with patient.

## 2015-01-10 NOTE — ED Notes (Signed)
Patient transported to CT 

## 2015-01-11 ENCOUNTER — Encounter (HOSPITAL_COMMUNITY): Payer: Self-pay | Admitting: Family Medicine

## 2015-01-11 ENCOUNTER — Inpatient Hospital Stay (HOSPITAL_COMMUNITY): Payer: Commercial Managed Care - HMO

## 2015-01-11 DIAGNOSIS — G40909 Epilepsy, unspecified, not intractable, without status epilepticus: Secondary | ICD-10-CM

## 2015-01-11 DIAGNOSIS — I481 Persistent atrial fibrillation: Secondary | ICD-10-CM

## 2015-01-11 DIAGNOSIS — J449 Chronic obstructive pulmonary disease, unspecified: Secondary | ICD-10-CM

## 2015-01-11 DIAGNOSIS — J189 Pneumonia, unspecified organism: Secondary | ICD-10-CM | POA: Diagnosis present

## 2015-01-11 DIAGNOSIS — I10 Essential (primary) hypertension: Secondary | ICD-10-CM

## 2015-01-11 LAB — BASIC METABOLIC PANEL
Anion gap: 6 (ref 5–15)
BUN: 15 mg/dL (ref 6–20)
CALCIUM: 8.2 mg/dL — AB (ref 8.9–10.3)
CO2: 25 mmol/L (ref 22–32)
CREATININE: 0.95 mg/dL (ref 0.61–1.24)
Chloride: 109 mmol/L (ref 101–111)
GFR calc Af Amer: >60 mL/min (ref >60)
GLUCOSE: 106 mg/dL — AB (ref 65–99)
Potassium: 3.9 mmol/L (ref 3.5–5.1)
Sodium: 140 mmol/L (ref 135–145)

## 2015-01-11 LAB — TROPONIN I: Troponin I: 0.03 ng/mL (ref <0.031)

## 2015-01-11 LAB — INFLUENZA PANEL BY PCR (TYPE A & B)
H1N1FLUPCR: NOT DETECTED
Influenza A By PCR: NEGATIVE
Influenza B By PCR: NEGATIVE

## 2015-01-11 LAB — URINE CULTURE: CULTURE: NO GROWTH

## 2015-01-11 LAB — CBC
HCT: 42.2 % (ref 39.0–52.0)
Hemoglobin: 14.2 g/dL (ref 13.0–17.0)
MCH: 32.9 pg (ref 26.0–34.0)
MCHC: 33.6 g/dL (ref 30.0–36.0)
MCV: 97.7 fL (ref 78.0–100.0)
PLATELETS: 190 10*3/uL (ref 150–400)
RBC: 4.32 MIL/uL (ref 4.22–5.81)
RDW: 15.1 % (ref 11.5–15.5)
WBC: 11.2 10*3/uL — ABNORMAL HIGH (ref 4.0–10.5)

## 2015-01-11 LAB — STREP PNEUMONIAE URINARY ANTIGEN: STREP PNEUMO URINARY ANTIGEN: NEGATIVE

## 2015-01-11 LAB — MRSA PCR SCREENING: MRSA by PCR: NEGATIVE

## 2015-01-11 MED ORDER — ATORVASTATIN CALCIUM 40 MG PO TABS
40.0000 mg | ORAL_TABLET | Freq: Every day | ORAL | Status: DC
  Administered 2015-01-11: 40 mg via ORAL
  Filled 2015-01-11: qty 1

## 2015-01-11 MED ORDER — GADOBENATE DIMEGLUMINE 529 MG/ML IV SOLN
20.0000 mL | Freq: Once | INTRAVENOUS | Status: AC
  Administered 2015-01-11: 20 mL via INTRAVENOUS

## 2015-01-11 MED ORDER — PANTOPRAZOLE SODIUM 40 MG PO TBEC
40.0000 mg | DELAYED_RELEASE_TABLET | Freq: Every day | ORAL | Status: DC
  Administered 2015-01-11 – 2015-01-12 (×2): 40 mg via ORAL
  Filled 2015-01-11 (×2): qty 1

## 2015-01-11 MED ORDER — VANCOMYCIN HCL IN DEXTROSE 750-5 MG/150ML-% IV SOLN
750.0000 mg | Freq: Two times a day (BID) | INTRAVENOUS | Status: DC
Start: 2015-01-11 — End: 2015-01-12
  Administered 2015-01-11 – 2015-01-12 (×3): 750 mg via INTRAVENOUS
  Filled 2015-01-11 (×4): qty 150

## 2015-01-11 MED ORDER — ALBUTEROL SULFATE (2.5 MG/3ML) 0.083% IN NEBU: 2.5000 mg | INHALATION_SOLUTION | RESPIRATORY_TRACT | Status: DC | PRN

## 2015-01-11 MED ORDER — MIDODRINE HCL 5 MG PO TABS
5.0000 mg | ORAL_TABLET | Freq: Three times a day (TID) | ORAL | Status: DC
Start: 2015-01-11 — End: 2015-01-12
  Administered 2015-01-11 – 2015-01-12 (×5): 5 mg via ORAL
  Filled 2015-01-11 (×5): qty 1

## 2015-01-11 MED ORDER — ASPIRIN 325 MG PO TABS
325.0000 mg | ORAL_TABLET | Freq: Every day | ORAL | Status: DC
  Administered 2015-01-12: 325 mg via ORAL
  Filled 2015-01-11: qty 1

## 2015-01-11 MED ORDER — TIOTROPIUM BROMIDE MONOHYDRATE 18 MCG IN CAPS
18.0000 ug | ORAL_CAPSULE | Freq: Every day | RESPIRATORY_TRACT | Status: DC
  Administered 2015-01-11 – 2015-01-12 (×2): 18 ug via RESPIRATORY_TRACT
  Filled 2015-01-11: qty 5

## 2015-01-11 MED ORDER — OXYCODONE HCL 5 MG PO TABS
5.0000 mg | ORAL_TABLET | ORAL | Status: DC | PRN
  Administered 2015-01-12: 5 mg via ORAL
  Filled 2015-01-11: qty 1

## 2015-01-11 MED ORDER — CEFEPIME HCL 1 G IJ SOLR
1.0000 g | Freq: Three times a day (TID) | INTRAMUSCULAR | Status: DC
  Administered 2015-01-11 – 2015-01-12 (×4): 1 g via INTRAVENOUS
  Filled 2015-01-11 (×9): qty 1

## 2015-01-11 MED ORDER — POTASSIUM CHLORIDE CRYS ER 20 MEQ PO TBCR
20.0000 meq | EXTENDED_RELEASE_TABLET | Freq: Three times a day (TID) | ORAL | Status: DC
  Administered 2015-01-11 – 2015-01-12 (×5): 20 meq via ORAL
  Filled 2015-01-11 (×5): qty 1

## 2015-01-11 MED ORDER — SODIUM CHLORIDE 0.9 % IV SOLN
INTRAVENOUS | Status: DC
  Administered 2015-01-11: 16:00:00 via INTRAVENOUS
  Administered 2015-01-11: 500 mL via INTRAVENOUS
  Administered 2015-01-12: 05:00:00 via INTRAVENOUS

## 2015-01-11 MED ORDER — VALPROIC ACID 250 MG/5ML PO SYRP
250.0000 mg | ORAL_SOLUTION | Freq: Two times a day (BID) | ORAL | Status: DC
  Administered 2015-01-11 – 2015-01-12 (×3): 250 mg via ORAL
  Filled 2015-01-11 (×4): qty 5

## 2015-01-11 MED ORDER — ENOXAPARIN SODIUM 40 MG/0.4ML ~~LOC~~ SOLN
40.0000 mg | SUBCUTANEOUS | Status: DC
  Administered 2015-01-11 – 2015-01-12 (×2): 40 mg via SUBCUTANEOUS
  Filled 2015-01-11 (×2): qty 0.4

## 2015-01-11 MED ORDER — LEVETIRACETAM 500 MG PO TABS
1000.0000 mg | ORAL_TABLET | Freq: Three times a day (TID) | ORAL | Status: DC
  Administered 2015-01-11 – 2015-01-12 (×5): 1000 mg via ORAL
  Filled 2015-01-11 (×5): qty 2

## 2015-01-11 MED ORDER — BUDESONIDE-FORMOTEROL FUMARATE 160-4.5 MCG/ACT IN AERO
2.0000 | INHALATION_SPRAY | Freq: Two times a day (BID) | RESPIRATORY_TRACT | Status: DC
  Administered 2015-01-11 – 2015-01-12 (×3): 2 via RESPIRATORY_TRACT
  Filled 2015-01-11: qty 6

## 2015-01-11 MED ORDER — ALPRAZOLAM 0.5 MG PO TABS: 1.0000 mg | ORAL_TABLET | Freq: Three times a day (TID) | ORAL | Status: DC | PRN

## 2015-01-11 NOTE — Clinical Social Work Note (Signed)
Clinical Social Work Assessment  Patient Details  Name: Jeffrey Sweeney MRN: [id number] Date of Birth: [birth year]  Date of referral:  01/11/15               Reason for consult:  Facility Placement (Return to SNF)                Permission sought to share information with:  Facility Sport and exercise psychologist, Family Supports Permission granted to share information::  Yes, Verbal Permission Granted  Name::     [facility] [place], Daughter- Sports administrator      Housing/Transportation Living arrangements for the past 2 months:  [place] of Information:  Patient Patient Interpreter Needed:  None Criminal Activity/Legal Involvement Pertinent to Current Situation/Hospitalization:  No - Comment as needed Significant Relationships:  Adult Children Lives with:  Facility Resident (Placed at [facility]) Do you feel safe going back to the place where you live?  Yes Need for family participation in patient care:  No (Coment) ([place] with daughter's involvement)  Care giving concerns:  Patient placed at [facility] this past New Years Eve for short term rehab.  "I would like to go home from here but I guess I need more therapy."   Social Worker assessment / plan:  70 year old male new resident of [facility] [place]. [name] spoke with Irven Shelling, Admissions [facility]. She stated that they would like to accept patient back when medically stable.  Fl2 initiated and will be placed on chart for MD's signature.  Patient is alert and oriented all spheres.    Employment status:  Retired Forensic scientist:  [employer]) PT Recommendations:  Not assessed at this time [place] / Referral to community resources:   [place]  (return)  Patient/Family's Response to care:  Patient states "I am not feeling good right now- but glad I am in the hospital.  I want to get better."  Patient/Family's Understanding of and Emotional  Response to Diagnosis, Current Treatment, and Prognosis:  Patient seems to have a fairly limited understanding of his current diagnosis and prognosis. "My breathing got bad but I'm not sure what happened."   Patient verbalize a desire to return home when he is able to leave the hospital- however, after speaking with this [name]- he is agreeable to return to SNF for continued rehab until he is stronger.  Patient states that he hopes to eventually return home and would receive help from his daughter.   Emotional Assessment Appearance:  Appears stated age Attitude/Demeanor/Rapport:   (Rational, cooperative, appropriate for age and situation) Affect (typically observed):  Quiet, Accepting, Pleasant Orientation:  Oriented to Self, Oriented to Place, Oriented to  Time, Oriented to Situation Alcohol / Substance use:  Never Used Psych involvement (Current and /or in the community):  No (Comment)  Discharge Needs  Concerns to be addressed:  Care Coordination Readmission within the last 30 days:  Yes Current discharge risk:  None Barriers to Discharge:  Continued Medical Work up   Kendell Bane T, Marlinda Mike 01/11/2015, 9:59 PM

## 2015-01-11 NOTE — ED Notes (Signed)
Patient transported to MRI 

## 2015-01-11 NOTE — ED Notes (Signed)
Attempted to call report x 1  

## 2015-01-11 NOTE — Progress Notes (Signed)
[facility] PROGRESS NOTE  Jeffrey Sweeney [id number] DOB: 18-Apr-1945 DOA: 01/10/2015  PCP: Alonza Bogus, MD  Brief HPI: 70 year old Caucasian male presented from skilled nursing facility with fever and left-sided weakness. He was admitted at [facility] a few days ago for COPD exacerbation. Patient was hospitalized for further management. Concern was about possible pneumonia.  Past medical history:  Past Medical History  Diagnosis Date  . Heart disease   . CAD (coronary artery disease)   . Stroke [facility])     left arm weakness  . Seizures ([place])   . Depression   . Hypertension   . Headache   . COPD (chronic obstructive pulmonary disease) ([place])   . AF (atrial fibrillation) ([place])   . Bipolar disorder ([place])     Consultants: None  Procedures: None  Antibiotics: Vancomycin and cefepime  Subjective: Patient mildly confused. Denies any pain. Has had a cough with whitish expectoration. Denies any chest pain. Some shortness of breath. No nausea, vomiting.  Objective: Vital Signs  Filed Vitals:   01/11/15 0730 01/11/15 0747 01/11/15 0841 01/11/15 0959  BP: 124/68 136/75 135/65   Pulse: 67 118 50   Temp:  98.7 F (37.1 C) 98.5 F (36.9 C)   TempSrc:  Oral Oral   Resp: 18 17 18    Height:   5\' 8"  (1.727 m)   SpO2: 96% 94% 98% 92%   No intake or output data in the 24 hours ending 01/11/15 1121 There were no vitals filed for this visit.  General appearance: alert, cooperative, appears stated age and no distress Resp: Diminished air entry at the bases. Few crackles. No wheezing. No rhonchi. Cardio: regular rate and rhythm, S1, S2 normal, no murmur, click, rub or gallop GI: soft, non-tender; bowel sounds normal; no masses,  no organomegaly Extremities: extremities normal, atraumatic, no cyanosis or edema Neurologic: Left-sided deficits noted. Patient does have a history of previous stroke.  Lab Results:  Basic Metabolic Panel:  Recent Labs Lab  01/05/15 1300 01/06/15 0552 01/10/15 2039 01/10/15 2050 01/11/15 0930  NA 135 137 140 139 140  K 3.8 4.3 4.7 4.5 3.9  CL 96* 99* 106 104 109  CO2 32 29 26  --  25  GLUCOSE 103* 148* 117* 115* 106*  BUN 13 15 15  23* 15  CREATININE 0.92 0.73 0.88 0.90 0.95  CALCIUM 8.8* 8.9 8.9  --  8.2*   Liver Function Tests:  Recent Labs Lab 01/06/15 0552 01/10/15 2039  AST 46* 26  ALT 26 24  ALKPHOS 57 73  BILITOT 1.2 0.9  PROT 7.0 6.3*  ALBUMIN 3.4* 3.1*   CBC:  Recent Labs Lab 01/05/15 1300 01/06/15 0552 01/10/15 2039 01/10/15 2050 01/11/15 0930  WBC 9.3 5.9 13.5*  --  11.2*  NEUTROABS 6.8  --  10.3*  --   --   HGB 14.9 14.8 15.3 17.0 14.2  HCT 44.9 43.7 45.1 50.0 42.2  MCV 98.9 99.1 97.0  --  97.7  PLT 241 266 205  --  190   Cardiac Enzymes:  Recent Labs Lab 01/05/15 1300 01/10/15 2039 01/11/15 0930  CKTOTAL 201  --   --   TROPONINI <0.03 0.08* 0.03   CBG:  Recent Labs Lab 01/10/15 2057  GLUCAP 114*    Recent Results (from the past 240 hour(s))  Urine culture     Status: None (Preliminary result)   Collection Time: 01/10/15  9:08 PM  Result Value Ref Range Status   Specimen Description  URINE, CATHETERIZED  Final   Special Requests NONE  Final   Culture NO GROWTH < 12 HOURS  Final   Report Status PENDING  Incomplete  MRSA PCR Screening     Status: None   Collection Time: 01/11/15 12:26 AM  Result Value Ref Range Status   MRSA by PCR NEGATIVE NEGATIVE Final    Comment:        The GeneXpert MRSA Assay (FDA approved for NASAL specimens only), is one component of a comprehensive MRSA colonization surveillance program. It is not intended to diagnose MRSA infection nor to guide or monitor treatment for MRSA infections.       Studies/Results: Ct Head Wo Contrast  01/10/2015  CLINICAL DATA:  Altered mental status. Left-sided deficits. Chest pain. EXAM: CT HEAD WITHOUT CONTRAST TECHNIQUE: Contiguous axial images were obtained from the base of the  skull through the vertex without intravenous contrast. COMPARISON:  Head CT 12/31/2014 FINDINGS: Sequela of remote right MCA distribution infarct again seen with multifocal encephalomalacia and ex vacuo dilatation of right lateral ventricle, unchanged in appearance. Generalized atrophy and chronic small vessel ischemic change. No acute hemorrhage, cerebral edema or midline shift. No evidence of acute ischemia. Chronic opacification of left maxillary sinus, with progressive mucosal thickening of the remaining paranasal sinuses. No calvarial fracture. Posterior scalp staples are noted. IMPRESSION: 1. Remote right MCA distribution infarct, unchanged from prior. No acute intracranial abnormality. 2. Increased inflammatory change in the paranasal sinuses from prior. Electronically Signed   By: Jeb Levering M.D.   On: 01/10/2015 22:41   Mr Jeri Cos [id number] Contrast  01/11/2015  CLINICAL DATA:  Initial evaluation for left-sided deficits. EXAM: MRI HEAD WITHOUT AND WITH CONTRAST TECHNIQUE: Multiplanar, multiecho pulse sequences of the brain and surrounding structures were obtained without and with intravenous contrast. CONTRAST:  21mL MULTIHANCE GADOBENATE DIMEGLUMINE 529 MG/ML IV SOLN COMPARISON:  Prior CT from 01/09/2014. FINDINGS: No abnormal foci of restricted diffusion to suggest acute intracranial infarct. Major intracranial vascular flow voids are maintained. No acute intracranial hemorrhage. Extensive encephalomalacia throughout the right parieto-occipital and temporal region, consistent with remote right MCA territory infarct. Additional remote lacunar infarct within the right lentiform nucleus. Associated gliosis with trace chronic hemosiderin staining. Additional remote infarct within the left cerebellar hemisphere. Age-related cerebral atrophy with mild chronic small vessel ischemic disease. No mass lesion, midline shift, or mass effect. No hydrocephalus. Mild ex vacuo dilatation of the right lateral ventricle  related to the right cerebral encephalomalacia. No extra-axial fluid collection. No abnormal enhancement. Craniocervical junction within normal limits. It is of note made of a partially empty sella. No acute abnormality about the orbits. Fluid levels present within the left sphenoid and right max O sinuses, suggesting acute sinusitis. Scattered mucosal thickening throughout the remainder the paranasal sinuses. Chronic volume loss present within the left maxillary sinus. Trace opacity within the left mastoid air cells. Inner ear structures grossly normal. Bone marrow signal intensity within normal limits. No scalp soft tissue abnormality. IMPRESSION: 1. No acute intracranial infarct or other process identified. 2. Remote large right MCA territory infarct, with additional remote infarcts involving the right lentiform nucleus and left cerebellar hemisphere. 3. Age-related cerebral atrophy with mild chronic small vessel ischemic disease. 4. Fluid levels within the left sphenoid and right maxillary sinuses, suggesting acute sinusitis. Electronically Signed   By: Jeannine Boga M.D.   On: 01/11/2015 05:41   Dg Chest Portable 1 View  01/10/2015  CLINICAL DATA:  Acute onset of generalized chest pain, fever and  altered mental status. Initial encounter. EXAM: PORTABLE CHEST 1 VIEW COMPARISON:  Chest radiograph and CTA of the chest performed 01/05/2015 FINDINGS: The lungs are well-aerated and clear. There is no evidence of focal opacification, pleural effusion or pneumothorax. The right lung apex is partially obscured by the patient's head. The cardiomediastinal silhouette is borderline enlarged. No acute osseous abnormalities are seen. IMPRESSION: Borderline cardiomegaly.  Lungs remain grossly clear. Electronically Signed   By: Garald Balding M.D.   On: 01/10/2015 22:05    Medications:  Scheduled: . atorvastatin  40 mg Oral QHS  . budesonide-formoterol  2 puff Inhalation BID  . ceFEPime (MAXIPIME) IV  1 g  Intravenous 3 times per day  . enoxaparin (LOVENOX) injection  40 mg Subcutaneous Q24H  . levETIRAcetam  1,000 mg Oral TID  . midodrine  5 mg Oral TID WC  . pantoprazole  40 mg Oral Daily  . potassium chloride SA  20 mEq Oral TID  . tiotropium  18 mcg Inhalation Daily  . Valproic Acid  250 mg Oral BID  . vancomycin  750 mg Intravenous Q12H   Continuous: . sodium chloride 500 mL (01/11/15 0952)   [id number], ALPRAZolam, oxyCODONE  Assessment/Plan:  Principal Problem:   HCAP (healthcare-associated pneumonia) Active Problems:   Essential hypertension   CORONARY ATHEROSCLEROSIS NATIVE CORONARY ARTERY   Bipolar 1 disorder, mixed, moderate (HCC)   Seizure disorder (HCC)   COPD (chronic obstructive pulmonary disease) (HCC)   Atrial fibrillation (HCC)   Fever    Sepsis from suspected pneumonia:  Suspected source pneumonia. Influenza PCR is pending. Continue broad-spectrum antibiotics for now. Sepsis protocol was initiated. Respiratory panel is pending. Intraoperative precautions for now. Sputum culture and urine antigens are ordered.  Left sided weakness:  No new stroke identified on MRI. Previous right-sided stroke noted which is most likely reason for his left-sided deficits. PTOT evaluation. Fever may have exacerbated his symptoms. He is not noted to be on any antiplatelet agent. We'll initiate aspirin. He is on a statin.  Atrial fibrillation   CHADS2Vasc 2. Not on warfarin due to recurrent falls trauma. Rate controlled without medicine.  Essential HTN:  Unclear why the patient is on midodrine and amlodipine both. Holding amlodipine.   COPD:  Stable. Continue home breathing treatments. Albuterol as needed.   Seizures Stable. Continue home levetiracetam and Depakote.  Elevated troponin: Suspect demand ischemia in sepsis.Trend troponin and consult Cardiology if rising.  DVT Prophylaxis: Lovenox    Code Status: Full code  Family Communication: Discussed with  the patient. No family at bedside  Disposition Plan: Continue management as outlined above.    LOS: 1 day   [facility] Pager [phone] 01/11/2015, 11:21 AM  If 7PM-7AM, please contact night-coverage at [email or web address], password [facility]

## 2015-01-11 NOTE — Progress Notes (Signed)
Patient held in ER overnight because no stepdown bed available.  Now mentating well, fever improved, feels "chest congestions has loosed up".    Vitals     Blood pressure 136/68, pulse 72, temperature 100.9 F (38.3 C), temperature source Oral, resp. rate 18, SpO2 95 %. Heart:     Regular rate and rhythm Lungs:    Coarse airway sounds Capillary Refill:   <2 sec Peripheral Pulse:   Radial pulse palpable Skin:     Normal Color  Stable for telemetry bed.  Continue antibiotics and admit to telemetry for CAP.

## 2015-01-11 NOTE — Progress Notes (Signed)
ANTIBIOTIC CONSULT NOTE - INITIAL  Pharmacy Consult for cefepime/vanc Indication: rule out pneumonia  Allergies  Allergen Reactions  . Meperidine Hcl Nausea And Vomiting    Patient Measurements:   Adjusted Body Weight:   Vital Signs: Temp: 100.9 F (38.3 C) (01/04 0503) Temp Source: Oral (01/04 0503) BP: 136/68 mmHg (01/04 0645) Pulse Rate: 72 (01/04 0645) Intake/Output from previous day:   Intake/Output from this shift:    Labs:  Recent Labs  01/10/15 2039 01/10/15 2050  WBC 13.5*  --   HGB 15.3 17.0  PLT 205  --   CREATININE 0.88 0.90   Estimated Creatinine Clearance: 69.6 mL/min (by C-G formula based on Cr of 0.9). No results for input(s): VANCOTROUGH, VANCOPEAK, VANCORANDOM, GENTTROUGH, GENTPEAK, GENTRANDOM, TOBRATROUGH, TOBRAPEAK, TOBRARND, AMIKACINPEAK, AMIKACINTROU, AMIKACIN in the last 72 hours.   Microbiology: Recent Results (from the past 720 hour(s))  MRSA PCR Screening     Status: None   Collection Time: 01/11/15 12:26 AM  Result Value Ref Range Status   MRSA by PCR NEGATIVE NEGATIVE Final    Comment:        The GeneXpert MRSA Assay (FDA approved for NASAL specimens only), is one component of a comprehensive MRSA colonization surveillance program. It is not intended to diagnose MRSA infection nor to guide or monitor treatment for MRSA infections.     Medical History: Past Medical History  Diagnosis Date  . Heart disease   . CAD (coronary artery disease)   . Stroke [facility])     left arm weakness  . Seizures ([place])   . Depression   . Hypertension     Assessment: 82 yom from NH. Recently at [facility] for COPD exacerbation. Now to ED with fever, L-sided weakness. Pharmacy consulted to dose vanc/cefepime for HCAP/sepsis. Given 1x doses of Vanc/Zosyn in ED at ~2300 last night. Tmax/24h 102.1, wbc wnl. SCr 0.9 on admit, CrCl~70.  1/3 vanc>> 1/4 cefepime>> 1/3 zosyn x1  1/3 BCx2>> 1/3 UC>> 1/4 Resp virus panel>> MRSA PCR neg  Goal of Therapy:   Vancomycin trough level 15-20 mcg/ml  Plan:  Vanc 1g IV x1 given in ED; then Vanc 750mg  IV q12h Cefepime 1g IV q8h Monitor clinical progress, c/s, renal function, abx plan/LOT VT@SS  as indicated Consider d/c vanc - MRSA pcr neg  Elicia Lamp, PharmD, [facility] Clinical Pharmacist Pager [phone] 01/11/2015 7:30 AM

## 2015-01-11 NOTE — NC FL2 (Signed)
[place] LEVEL OF CARE SCREENING TOOL     IDENTIFICATION  Patient Name: Jeffrey Sweeney Birthdate: [birth year] Sex: male Admission Date (Current Location): 01/10/2015  [facility] and [place] Number:      Facility and Address:  [facility], [place] [address] [place], [place] [address]      Provider Number: [id number]  Attending Physician Name and Address:  Bonnielee Haff, MD  Relative Name and Phone Number:       Current Level of Care: Hospital Recommended Level of Care: [place] Prior Approval Number:    Date Approved/Denied:   PASRR Number:    Discharge Plan: SNF    Current Diagnoses: Patient Active Problem List   Diagnosis Date Noted  . HCAP (healthcare-associated pneumonia) 01/11/2015  . Fever 01/10/2015  . Osteoporosis 01/07/2015  . Compression fracture of lumbar spine, non-traumatic 01/07/2015  . COPD exacerbation ([place]) 01/05/2015  . Falls 01/05/2015  . Hypokalemia 08/14/2011  . DVT (deep venous thrombosis) ([place]) 08/14/2011  . Atrial fibrillation ([place]) 08/13/2011  . Rib fractures 08/07/2011  . Respiratory distress 08/07/2011  . COPD (chronic obstructive pulmonary disease) ([place]) 08/07/2011  . Atelectasis 08/07/2011  . H/O chest tube placement 08/07/2011  . Hemothorax 08/07/2011  . HYPERLIPIDEMIA 07/17/2010  . ANXIETY DEPRESSION 07/17/2010  . Essential hypertension 07/17/2010  . CAD 07/17/2010  . CORONARY ATHEROSCLEROSIS NATIVE CORONARY ARTERY 07/17/2010  . STROKE 07/17/2010  . CEREBROVASCULAR DISEASE 07/17/2010  . GERD 07/17/2010  . Seizure disorder ([place]) 07/17/2010  . SPLENIC INFARCTION 07/05/2009  . EMBOLISM&THROMBOSIS OF OTHER SPECIFIED ARTERY 07/05/2009  . EARLY SATIETY 07/05/2009  . WEIGHT LOSS, ABNORMAL 07/05/2009  . DIARRHEA 07/05/2009  . LUQ PAIN 07/05/2009  . PERSONAL HISTORY OF COLONIC POLYPS 07/05/2009  . Orthostatic hypotension 05/01/2009  . SYNCOPE 05/01/2009  . CHEST PAIN 05/01/2009   . LOW BACK PAIN, MILD 04/20/2009  . Bipolar 1 disorder, mixed, moderate ([place]) 04/20/2009    Orientation RESPIRATION BLADDER Height & Weight    Self, Time, Situation, Place  O2 (2L [place]) External catheter 5\' 8"  (172.7 cm) 140 lbs.  BEHAVIORAL SYMPTOMS/MOOD NEUROLOGICAL BOWEL NUTRITION STATUS    Convulsions/Seizures Continent Diet  AMBULATORY STATUS COMMUNICATION OF NEEDS Skin   Limited Assist Verbally Normal                       Personal Care Assistance Level of Assistance  Bathing, Feeding, Dressing Bathing Assistance: Limited assistance Feeding assistance: Limited assistance Dressing Assistance: Limited assistance     Functional Limitations Info  Sight, Hearing, Speech Sight Info: Adequate Hearing Info: Adequate Speech Info: Adequate    SPECIAL CARE FACTORS FREQUENCY  PT (By licensed PT)     PT Frequency: 5/wk              Contractures      Additional Factors Info  Code Status, Allergies Code Status Info: FULL Allergies Info: Meperidine Hcl           Current Medications (01/11/2015):  This is the current hospital active medication list Current Facility-Administered Medications  Medication Dose Route Frequency Provider Last Rate Last Dose  . 0.9 %  sodium chloride infusion   Intravenous Continuous Edwin Dada, MD 125 mL/hr at 01/11/15 1541    . albuterol (PROVENTIL) (2.5 MG/3ML) 0.083% nebulizer solution 2.5 mg  2.5 mg Nebulization Q2H PRN Edwin Dada, MD      . ALPRAZolam Duanne Moron) tablet 1 mg  1 mg Oral TID PRN Har[place] Gave  Shirleen Schirmer, MD      . Derrill Memo ON 01/12/2015] aspirin tablet 325 mg  325 mg Oral Daily Bonnielee Haff, MD      . atorvastatin (LIPITOR) tablet 40 mg  40 mg Oral QHS Edwin Dada, MD      . budesonide-formoterol (SYMBICORT) 160-4.5 MCG/ACT inhaler 2 puff  2 puff Inhalation BID Edwin Dada, MD   2 puff at 01/11/15 0957  . ceFEPIme (MAXIPIME) 1 g in dextrose 5 % 50 mL IVPB  1 g Intravenous 3 times per day  Edwin Dada, MD 100 mL/hr at 01/11/15 1541 1 g at 01/11/15 1541  . enoxaparin (LOVENOX) injection 40 mg  40 mg Subcutaneous Q24H Edwin Dada, MD   40 mg at 01/11/15 1218  . levETIRAcetam (KEPPRA) tablet 1,000 mg  1,000 mg Oral TID Edwin Dada, MD   1,000 mg at 01/11/15 1603  . midodrine (PROAMATINE) tablet 5 mg  5 mg Oral TID WC Edwin Dada, MD   5 mg at 01/11/15 1603  . oxyCODONE (Oxy IR/ROXICODONE) immediate release tablet 5 mg  5 mg Oral Q4H PRN Edwin Dada, MD      . pantoprazole (PROTONIX) EC tablet 40 mg  40 mg Oral Daily Edwin Dada, MD   40 mg at 01/11/15 1217  . potassium chloride SA (K-DUR,KLOR-CON) CR tablet 20 mEq  20 mEq Oral TID Edwin Dada, MD   20 mEq at 01/11/15 1602  . tiotropium (SPIRIVA) inhalation capsule 18 mcg  18 mcg Inhalation Daily Edwin Dada, MD   18 mcg at 01/11/15 0958  . Valproic Acid (DEPAKENE) 250 MG/5ML syrup SYRP 250 mg  250 mg Oral BID Edwin Dada, MD   250 mg at 01/11/15 1217  . vancomycin (VANCOCIN) IVPB 750 mg/150 ml premix  750 mg Intravenous Q12H Romona Curls, RPH   750 mg at 01/11/15 1217     Discharge Medications: Please see discharge summary for a list of discharge medications.  Relevant Imaging Results:  Relevant Lab Results:   Additional Information SS#: [id number], PASAR expires 1/29  Cranford Mon, [place]

## 2015-01-12 DIAGNOSIS — R569 Unspecified convulsions: Secondary | ICD-10-CM | POA: Diagnosis not present

## 2015-01-12 DIAGNOSIS — E784 Other hyperlipidemia: Secondary | ICD-10-CM | POA: Diagnosis not present

## 2015-01-12 DIAGNOSIS — N390 Urinary tract infection, site not specified: Secondary | ICD-10-CM | POA: Diagnosis not present

## 2015-01-12 DIAGNOSIS — J168 Pneumonia due to other specified infectious organisms: Secondary | ICD-10-CM | POA: Diagnosis not present

## 2015-01-12 DIAGNOSIS — F319 Bipolar disorder, unspecified: Secondary | ICD-10-CM | POA: Diagnosis not present

## 2015-01-12 DIAGNOSIS — J189 Pneumonia, unspecified organism: Secondary | ICD-10-CM

## 2015-01-12 DIAGNOSIS — I482 Chronic atrial fibrillation: Secondary | ICD-10-CM | POA: Diagnosis not present

## 2015-01-12 DIAGNOSIS — I2510 Atherosclerotic heart disease of native coronary artery without angina pectoris: Secondary | ICD-10-CM | POA: Diagnosis not present

## 2015-01-12 DIAGNOSIS — I6789 Other cerebrovascular disease: Secondary | ICD-10-CM | POA: Diagnosis not present

## 2015-01-12 DIAGNOSIS — J449 Chronic obstructive pulmonary disease, unspecified: Secondary | ICD-10-CM | POA: Diagnosis not present

## 2015-01-12 DIAGNOSIS — R410 Disorientation, unspecified: Secondary | ICD-10-CM | POA: Diagnosis not present

## 2015-01-12 DIAGNOSIS — R509 Fever, unspecified: Secondary | ICD-10-CM | POA: Diagnosis not present

## 2015-01-12 DIAGNOSIS — E7800 Pure hypercholesterolemia, unspecified: Secondary | ICD-10-CM | POA: Diagnosis not present

## 2015-01-12 DIAGNOSIS — I10 Essential (primary) hypertension: Secondary | ICD-10-CM | POA: Diagnosis not present

## 2015-01-12 LAB — LEGIONELLA ANTIGEN, URINE

## 2015-01-12 MED ORDER — OXYCODONE HCL 5 MG PO TABS: 5.0000 mg | ORAL_TABLET | ORAL | Status: DC | PRN

## 2015-01-12 MED ORDER — LEVOFLOXACIN 750 MG PO TABS: 750.0000 mg | ORAL_TABLET | Freq: Every day | ORAL | Status: DC

## 2015-01-12 MED ORDER — ALPRAZOLAM 1 MG PO TABS: 1.0000 mg | ORAL_TABLET | Freq: Three times a day (TID) | ORAL | Status: DC | PRN

## 2015-01-12 MED ORDER — ASPIRIN EC 81 MG PO TBEC: 81.0000 mg | DELAYED_RELEASE_TABLET | Freq: Every day | ORAL | Status: DC

## 2015-01-12 NOTE — Progress Notes (Signed)
Noted staples in back of patients head and MD was called to see what she wanted to do on the way to [facility] and did not want patient to go without orders

## 2015-01-12 NOTE — Progress Notes (Signed)
Patient will discharge to [facility] Anticipated discharge date: 1/5 Family notified: dtr Transportation by [facility]- called at 4pm- expect 1.5 hour delay  [name] signing off.  Domenica Reamer, [place] Social Worker [phone]

## 2015-01-12 NOTE — Consult Note (Signed)
   [facility] Inpatient Consult   01/12/2015  Jeffrey Sweeney 04/17/1945 [id number] Patient evaluat.  ed for community based chronic disease management services with [place] [facility] as a benefit of patient's [employer] Medicare Insurance. Met  with patient at bedside to explain [place] Management services.  Patient states that he is very weak and will go back to the skilled nursing facility to get his "strength back".  Patient states, "I am having difficulty at home managing at times especially since having my stroke.  I recently fell and bumped my head.  My daughter, Donella Stade is supportive and I have twin boys who are older than her that helps me out at times." Will follow patient for discharge planning for return home.  Consent form signed.   Patient will receive post hospital discharge call and will be evaluated for monthly home visits for assessments and disease process education.  Left contact information and [facility] literature at bedside. Made Inpatient Case Manager aware that [place] Management following. Of note, [facility] Care Management services does not replace or interfere with any services that are arranged by inpatient case management or social work.  For additional questions or referrals please contact:   Natividad Brood, RN BSN [facility] Liaison  [phone] business mobile phone

## 2015-01-12 NOTE — Discharge Summary (Signed)
Physician Discharge Summary  Jeffrey Sweeney MRN: [id number] DOB/AGE: 04/24/1945 70 y.o.  PCP: Alonza Bogus, MD   Admit date: 01/10/2015 Discharge date: 01/12/2015  Discharge Diagnoses:     Principal Problem:   HCAP (healthcare-associated pneumonia) Active Problems:   Essential hypertension   CORONARY ATHEROSCLEROSIS NATIVE CORONARY ARTERY   Bipolar 1 disorder, mixed, moderate (HCC)   Seizure disorder (HCC)   COPD (chronic obstructive pulmonary disease) (HCC)   Atrial fibrillation (HCC)   Fever  acute sinusitis   Follow-up recommendations Follow-up with PCP in 3-5 days , including all  additional recommended appointments as below Follow-up CBC, CMP in 3-5 days      Medication List    TAKE these medications        alendronate 10 MG tablet  Commonly known as:  FOSAMAX  Take 4 tablets (40 mg total) by mouth every 7 (seven) days. Take with a full glass of water on an empty stomach.     ALPRAZolam 1 MG tablet  Commonly known as:  XANAX  Take 1 tablet (1 mg total) by mouth 3 (three) times daily as needed. For anxiety     amLODipine 5 MG tablet  Commonly known as:  NORVASC  Take 5 mg by mouth daily.     aspirin EC 81 MG tablet  Take 1 tablet (81 mg total) by mouth daily.     atorvastatin 40 MG tablet  Commonly known as:  LIPITOR  Take 40 mg by mouth at bedtime.     budesonide-formoterol 160-4.5 MCG/ACT inhaler  Commonly known as:  SYMBICORT  Inhale 2 puffs into the lungs 2 (two) times daily.     levETIRAcetam 1000 MG tablet  Commonly known as:  KEPPRA  Take 1,000 mg by mouth 3 (three) times daily.     levofloxacin 750 MG tablet  Commonly known as:  LEVAQUIN  Take 1 tablet (750 mg total) by mouth daily.     midodrine 5 MG tablet  Commonly known as:  PROAMATINE  Take 1 tablet (5 mg total) by mouth 3 (three) times daily with meals.     nitroGLYCERIN 0.4 MG SL tablet  Commonly known as:  NITROSTAT  Place 0.4 mg under the tongue every 5 (five) minutes as  needed. Chest pain     oxyCODONE 5 MG immediate release tablet  Commonly known as:  Oxy IR/ROXICODONE  Take 1 tablet (5 mg total) by mouth every 4 (four) hours as needed for moderate pain.     pantoprazole 40 MG tablet  Commonly known as:  PROTONIX  Take 40 mg by mouth daily.     potassium chloride SA 20 MEQ tablet  Commonly known as:  K-DUR,KLOR-CON  Take 20 mEq by mouth 3 (three) times daily.     predniSONE 20 MG tablet  Commonly known as:  DELTASONE  Take 2 tablets (40 mg total) by mouth daily with breakfast.     albuterol (2.5 MG/3ML) 0.083% nebulizer solution  Commonly known as:  PROVENTIL  Take 3 mLs (2.5 mg total) by nebulization every 2 (two) hours as needed for wheezing or shortness of breath.     PROVENTIL HFA 108 (90 Base) MCG/ACT inhaler  Generic drug:  albuterol  Inhale 2 puffs into the lungs every 6 (six) hours as needed for wheezing or shortness of breath.     tiotropium 18 MCG inhalation capsule  Commonly known as:  SPIRIVA  Place 1 capsule (18 mcg total) into inhaler and inhale daily.  Valproic Acid 250 MG/5ML Syrp syrup  Commonly known as:  DEPAKENE  Take 250 mg by mouth 2 (two) times daily.     Vitamin D 2000 units Caps  Take 1 capsule by mouth daily.         Discharge Condition: Stable Discharge Instructions     Allergies  Allergen Reactions  . Meperidine Hcl Nausea And Vomiting      Disposition: 03-Skilled Nursing Facility   Consults:  None     Significant Diagnostic Studies:  Dg Chest 1 View  12/31/2014  CLINICAL DATA:  Fall. EXAM: CHEST 1 VIEW COMPARISON:  12/16/2012 FINDINGS: Mild cardiac enlargement. No pleural effusion or edema. No airspace consolidation. Left posterior rib fracture deformities are again noted, chronic. IMPRESSION: 1. No acute cardiopulmonary abnormalities. 2. Chronic left posterior rib fractures. Electronically Signed   By: Kerby Moors M.D.   On: 12/31/2014 10:52   Dg Chest 2 View  01/05/2015   CLINICAL DATA:  Two falls in 2 days. Left chest pain, landing on chest. History of rib fractures. Shortness of breath and productive cough for 2 weeks. EXAM: CHEST  2 VIEW COMPARISON:  12/31/2014 FINDINGS: Mild elevation of the right hemidiaphragm. Mild cardiomegaly. No confluent airspace opacities or effusions. Mild cardiomegaly. Old left rib fractures are again noted, stable. No visible acute rib fractures. No pneumothorax. IMPRESSION: Old left rib fractures.  No visible new/ acute rib fractures. Mild cardiomegaly. Electronically Signed   By: Rolm Baptise M.D.   On: 01/05/2015 15:30   Dg Lumbar Spine Complete  01/05/2015  CLINICAL DATA:  Pain following recent falls EXAM: LUMBAR SPINE - COMPLETE 4+ VIEW COMPARISON:  December 31, 2014 FINDINGS: Frontal, lateral, spot lumbosacral lateral, and bilateral oblique views were obtained. There are 5 non-rib-bearing lumbar type vertebral bodies. There is lumbar dextroscoliosis. Anterior wedge compression fractures of L1, L2, and L3 are stable in appearance. There is also mild apparent concavity along the superior endplates of P29 and J18, stable and likely due in part to the scoliosis. There is no new fracture. No spondylolisthesis. There is mild disc space narrowing at L4-5 and L5-S1 as well as at L1-2, stable. There is facet osteoarthritic change at L4-5 and L5-S1 bilaterally. IMPRESSION: Anterior wedge compression fractures at L1, L2, and L3 are stable compared to recent prior study. No new fracture. No spondylolisthesis. Stable scoliosis and areas of arthropathy. Electronically Signed   By: Lowella Grip III M.D.   On: 01/05/2015 15:13   Dg Lumbar Spine Complete  12/31/2014  CLINICAL DATA:  Status post fall at home with pain of lower back. EXAM: LUMBAR SPINE - COMPLETE 4+ VIEW COMPARISON:  CT abdomen pelvis August 07, 2011 FINDINGS: There is 50% compression deformity of L1. There is 20% compression deformity of L2 and L3. No definite cortical discontinuity is  identified in L1, L2 and L3. Degenerative joint changes are identified throughout lumbar spine. There is no dislocation. There is chronic posttraumatic change of the left tenth rib present on prior CT of 2013. IMPRESSION: Compression deformities of L1 through L3, probably chronic. There is no dislocation. Electronically Signed   By: Abelardo Diesel M.D.   On: 12/31/2014 10:51   Ct Head Wo Contrast  01/10/2015  CLINICAL DATA:  Altered mental status. Left-sided deficits. Chest pain. EXAM: CT HEAD WITHOUT CONTRAST TECHNIQUE: Contiguous axial images were obtained from the base of the skull through the vertex without intravenous contrast. COMPARISON:  Head CT 12/31/2014 FINDINGS: Sequela of remote right MCA distribution infarct again seen  with multifocal encephalomalacia and ex vacuo dilatation of right lateral ventricle, unchanged in appearance. Generalized atrophy and chronic small vessel ischemic change. No acute hemorrhage, cerebral edema or midline shift. No evidence of acute ischemia. Chronic opacification of left maxillary sinus, with progressive mucosal thickening of the remaining paranasal sinuses. No calvarial fracture. Posterior scalp staples are noted. IMPRESSION: 1. Remote right MCA distribution infarct, unchanged from prior. No acute intracranial abnormality. 2. Increased inflammatory change in the paranasal sinuses from prior. Electronically Signed   By: Jeb Levering M.D.   On: 01/10/2015 22:41   Ct Head Wo Contrast  12/31/2014  CLINICAL DATA:  Trauma. Fall. Hit head 1 hour ago. Loss of consciousness and laceration to posterior head. EXAM: CT HEAD WITHOUT CONTRAST CT CERVICAL SPINE WITHOUT CONTRAST TECHNIQUE: Multidetector CT imaging of the head and cervical spine was performed following the standard protocol without intravenous contrast. Multiplanar CT image reconstructions of the cervical spine were also generated. COMPARISON:  08/07/2011 FINDINGS: CT HEAD FINDINGS Encephalomalacia involving the  right cerebral hemisphere is again noted and appears unchanged from previous exam compatible with chronic MCA infarct. Prominence of the sulci and ventricles noted consistent with brain atrophy. No evidence for acute brain infarct, acute intracranial hemorrhage or mass. Chronic opacification of the left maxillary sinus identified. The mastoid air cells are clear. The the calvarium is intact. Posterior scout laceration and hematoma identified CT CERVICAL SPINE FINDINGS Normal alignment of the cervical spine. The vertebral body heights are well preserved. Disc space narrowing and ventral endplate spurring is noted. Most advanced at C5-6 and C6-7. The facet joints are all well aligned. The prevertebral soft tissue space appears normal. No fractures or subluxations identified. IMPRESSION: 1. No acute intracranial abnormalities. 2. Chronic right MCA infarct. 3. Chronic left maxillary sinus disease. 4. Posterior scalp laceration 5. No evidence for cervical spine fracture. 6. Cervical degenerative disc disease noted. Electronically Signed   By: Kerby Moors M.D.   On: 12/31/2014 11:12   Ct Angio Chest Pe W/cm &/or Wo Cm  01/05/2015  CLINICAL DATA:  Initial encounter for 2 week history of dyspnea and left anterior chest pain with hypoxia. Increased D-dimer. EXAM: CT ANGIOGRAPHY CHEST WITH CONTRAST TECHNIQUE: Multidetector CT imaging of the chest was performed using the standard protocol during bolus administration of intravenous contrast. Multiplanar CT image reconstructions and MIPs were obtained to evaluate the vascular anatomy. CONTRAST:  175m OMNIPAQUE IOHEXOL 350 MG/ML SOLN COMPARISON:  08/15/2011. FINDINGS: Mediastinum / Lymph Nodes: There is no axillary lymphadenopathy. Scattered small lymph nodes in the mediastinum do not meet CT criteria for pathologic enlargement. There is no hilar lymphadenopathy. The esophagus has normal imaging features. The heart size is normal. No pericardial effusion. Coronary artery  calcification is noted. No filling defect in the opacified pulmonary arteries to suggest the presence of an acute pulmonary embolus. No evidence for dissection flap in the thoracic aorta. Lungs / Pleura: There is some mucus identified in the mid to distal trachea. Subsegmental atelectasis evident in the lower lobes bilaterally. No pulmonary edema or pleural effusion. No suspicious pulmonary nodule or mass. No focal airspace consolidation. Upper Abdomen:  Unremarkable. MSK / Soft Tissues: Bone windows reveal no worrisome lytic or sclerotic osseous lesions. Review of the MIP images confirms the above findings. IMPRESSION: 1. No CT evidence for acute pulmonary embolus. 2. Dependent subsegmental atelectasis in the lungs bilaterally. 3. No specific findings to explain the patient's history of difficulty breathing. Electronically Signed   By: EVerda CuminsD.  On: 01/05/2015 17:56   Ct Cervical Spine Wo Contrast  12/31/2014  CLINICAL DATA:  Trauma. Fall. Hit head 1 hour ago. Loss of consciousness and laceration to posterior head. EXAM: CT HEAD WITHOUT CONTRAST CT CERVICAL SPINE WITHOUT CONTRAST TECHNIQUE: Multidetector CT imaging of the head and cervical spine was performed following the standard protocol without intravenous contrast. Multiplanar CT image reconstructions of the cervical spine were also generated. COMPARISON:  08/07/2011 FINDINGS: CT HEAD FINDINGS Encephalomalacia involving the right cerebral hemisphere is again noted and appears unchanged from previous exam compatible with chronic MCA infarct. Prominence of the sulci and ventricles noted consistent with brain atrophy. No evidence for acute brain infarct, acute intracranial hemorrhage or mass. Chronic opacification of the left maxillary sinus identified. The mastoid air cells are clear. The the calvarium is intact. Posterior scout laceration and hematoma identified CT CERVICAL SPINE FINDINGS Normal alignment of the cervical spine. The vertebral body  heights are well preserved. Disc space narrowing and ventral endplate spurring is noted. Most advanced at C5-6 and C6-7. The facet joints are all well aligned. The prevertebral soft tissue space appears normal. No fractures or subluxations identified. IMPRESSION: 1. No acute intracranial abnormalities. 2. Chronic right MCA infarct. 3. Chronic left maxillary sinus disease. 4. Posterior scalp laceration 5. No evidence for cervical spine fracture. 6. Cervical degenerative disc disease noted. Electronically Signed   By: Signa Kell M.D.   On: 12/31/2014 11:12   Mr Laqueta Jean WG Contrast  01/11/2015  CLINICAL DATA:  Initial evaluation for left-sided deficits. EXAM: MRI HEAD WITHOUT AND WITH CONTRAST TECHNIQUE: Multiplanar, multiecho pulse sequences of the brain and surrounding structures were obtained without and with intravenous contrast. CONTRAST:  73mL MULTIHANCE GADOBENATE DIMEGLUMINE 529 MG/ML IV SOLN COMPARISON:  Prior CT from 01/09/2014. FINDINGS: No abnormal foci of restricted diffusion to suggest acute intracranial infarct. Major intracranial vascular flow voids are maintained. No acute intracranial hemorrhage. Extensive encephalomalacia throughout the right parieto-occipital and temporal region, consistent with remote right MCA territory infarct. Additional remote lacunar infarct within the right lentiform nucleus. Associated gliosis with trace chronic hemosiderin staining. Additional remote infarct within the left cerebellar hemisphere. Age-related cerebral atrophy with mild chronic small vessel ischemic disease. No mass lesion, midline shift, or mass effect. No hydrocephalus. Mild ex vacuo dilatation of the right lateral ventricle related to the right cerebral encephalomalacia. No extra-axial fluid collection. No abnormal enhancement. Craniocervical junction within normal limits. It is of note made of a partially empty sella. No acute abnormality about the orbits. Fluid levels present within the left  sphenoid and right max O sinuses, suggesting acute sinusitis. Scattered mucosal thickening throughout the remainder the paranasal sinuses. Chronic volume loss present within the left maxillary sinus. Trace opacity within the left mastoid air cells. Inner ear structures grossly normal. Bone marrow signal intensity within normal limits. No scalp soft tissue abnormality. IMPRESSION: 1. No acute intracranial infarct or other process identified. 2. Remote large right MCA territory infarct, with additional remote infarcts involving the right lentiform nucleus and left cerebellar hemisphere. 3. Age-related cerebral atrophy with mild chronic small vessel ischemic disease. 4. Fluid levels within the left sphenoid and right maxillary sinuses, suggesting acute sinusitis. Electronically Signed   By: Rise Mu M.D.   On: 01/11/2015 05:41   Dg Chest Portable 1 View  01/10/2015  CLINICAL DATA:  Acute onset of generalized chest pain, fever and altered mental status. Initial encounter. EXAM: PORTABLE CHEST 1 VIEW COMPARISON:  Chest radiograph and CTA of the chest performed 01/05/2015  FINDINGS: The lungs are well-aerated and clear. There is no evidence of focal opacification, pleural effusion or pneumothorax. The right lung apex is partially obscured by the patient's head. The cardiomediastinal silhouette is borderline enlarged. No acute osseous abnormalities are seen. IMPRESSION: Borderline cardiomegaly.  Lungs remain grossly clear. Electronically Signed   By: Garald Balding M.D.   On: 01/10/2015 22:05          Filed Weights   01/11/15 0841  Weight: 99.2 kg (218 lb 11.1 oz)     Microbiology: Recent Results (from the past 240 hour(s))  Blood culture (routine x 2)     Status: None (Preliminary result)   Collection Time: 01/10/15  8:53 PM  Result Value Ref Range Status   Specimen Description BLOOD LEFT ANTECUBITAL  Final   Special Requests BOTTLES DRAWN AEROBIC AND ANAEROBIC 5CC  Final   Culture NO  GROWTH < 24 HOURS  Final   Report Status PENDING  Incomplete  Blood culture (routine x 2)     Status: None (Preliminary result)   Collection Time: 01/10/15  9:00 PM  Result Value Ref Range Status   Specimen Description BLOOD RIGHT ANTECUBITAL  Final   Special Requests BOTTLES DRAWN AEROBIC AND ANAEROBIC 5CC  Final   Culture NO GROWTH < 24 HOURS  Final   Report Status PENDING  Incomplete  Urine culture     Status: None   Collection Time: 01/10/15  9:08 PM  Result Value Ref Range Status   Specimen Description URINE, CATHETERIZED  Final   Special Requests NONE  Final   Culture NO GROWTH 1 DAY  Final   Report Status 01/11/2015 FINAL  Final  MRSA PCR Screening     Status: None   Collection Time: 01/11/15 12:26 AM  Result Value Ref Range Status   MRSA by PCR NEGATIVE NEGATIVE Final    Comment:        The GeneXpert MRSA Assay (FDA approved for NASAL specimens only), is one component of a comprehensive MRSA colonization surveillance program. It is not intended to diagnose MRSA infection nor to guide or monitor treatment for MRSA infections.        Blood Culture    Component Value Date/Time   SDES URINE, CATHETERIZED 01/10/2015 2108   SPECREQUEST NONE 01/10/2015 2108   CULT NO GROWTH 1 DAY 01/10/2015 2108   REPTSTATUS 01/11/2015 FINAL 01/10/2015 2108      Labs: Results for orders placed or performed during the hospital encounter of 01/10/15 (from the past 48 hour(s))  Comprehensive metabolic panel     Status: Abnormal   Collection Time: 01/10/15  8:39 PM  Result Value Ref Range   Sodium 140 135 - 145 mmol/L   Potassium 4.7 3.5 - 5.1 mmol/L   Chloride 106 101 - 111 mmol/L   CO2 26 22 - 32 mmol/L   Glucose, Bld 117 (H) 65 - 99 mg/dL   BUN 15 6 - 20 mg/dL   Creatinine, Ser 0.88 0.61 - 1.24 mg/dL   Calcium 8.9 8.9 - 10.3 mg/dL   Total Protein 6.3 (L) 6.5 - 8.1 g/dL   Albumin 3.1 (L) 3.5 - 5.0 g/dL   AST 26 15 - 41 U/L   ALT 24 17 - 63 U/L   Alkaline Phosphatase 73 38  - 126 U/L   Total Bilirubin 0.9 0.3 - 1.2 mg/dL   GFR calc non Af Amer >60 >60 mL/min   GFR calc Af Amer >60 >60 mL/min    Comment: (NOTE)  The eGFR has been calculated using the CKD EPI equation. This calculation has not been validated in all clinical situations. eGFR's persistently <60 mL/min signify possible Chronic Kidney Disease.    Anion gap 8 5 - 15  CBC with Differential     Status: Abnormal   Collection Time: 01/10/15  8:39 PM  Result Value Ref Range   WBC 13.5 (H) 4.0 - 10.5 K/uL   RBC 4.65 4.22 - 5.81 MIL/uL   Hemoglobin 15.3 13.0 - 17.0 g/dL   HCT 45.1 39.0 - 52.0 %   MCV 97.0 78.0 - 100.0 fL   MCH 32.9 26.0 - 34.0 pg   MCHC 33.9 30.0 - 36.0 g/dL   RDW 14.9 11.5 - 15.5 %   Platelets 205 150 - 400 K/uL   Neutrophils Relative % 77 %   Neutro Abs 10.3 (H) 1.7 - 7.7 K/uL   Lymphocytes Relative 17 %   Lymphs Abs 2.3 0.7 - 4.0 K/uL   Monocytes Relative 6 %   Monocytes Absolute 0.9 0.1 - 1.0 K/uL   Eosinophils Relative 0 %   Eosinophils Absolute 0.0 0.0 - 0.7 K/uL   Basophils Relative 0 %   Basophils Absolute 0.0 0.0 - 0.1 K/uL  Troponin I     Status: Abnormal   Collection Time: 01/10/15  8:39 PM  Result Value Ref Range   Troponin I 0.08 (H) <0.031 ng/mL    Comment:        PERSISTENTLY INCREASED TROPONIN VALUES IN THE RANGE OF 0.04-0.49 ng/mL CAN BE SEEN IN:       -UNSTABLE ANGINA       -CONGESTIVE HEART FAILURE       -MYOCARDITIS       -CHEST TRAUMA       -ARRYHTHMIAS       -LATE PRESENTING MYOCARDIAL INFARCTION       -COPD   CLINICAL FOLLOW-UP RECOMMENDED.   I-stat troponin, ED     Status: None   Collection Time: 01/10/15  8:47 PM  Result Value Ref Range   Troponin i, poc 0.04 0.00 - 0.08 ng/mL   Comment 3            Comment: Due to the release kinetics of cTnI, a negative result within the first hours of the onset of symptoms does not rule out myocardial infarction with certainty. If myocardial infarction is still suspected, repeat the test at  appropriate intervals.   I-Stat CG4 Lactic Acid, ED     Status: None   Collection Time: 01/10/15  8:49 PM  Result Value Ref Range   Lactic Acid, Venous 1.08 0.5 - 2.0 mmol/L  I-stat Chem 8, ED     Status: Abnormal   Collection Time: 01/10/15  8:50 PM  Result Value Ref Range   Sodium 139 135 - 145 mmol/L   Potassium 4.5 3.5 - 5.1 mmol/L   Chloride 104 101 - 111 mmol/L   BUN 23 (H) 6 - 20 mg/dL   Creatinine, Ser 0.90 0.61 - 1.24 mg/dL   Glucose, Bld 115 (H) 65 - 99 mg/dL   Calcium, Ion 1.11 (L) 1.13 - 1.30 mmol/L   TCO2 26 0 - 100 mmol/L   Hemoglobin 17.0 13.0 - 17.0 g/dL   HCT 50.0 39.0 - 52.0 %  Blood culture (routine x 2)     Status: None (Preliminary result)   Collection Time: 01/10/15  8:53 PM  Result Value Ref Range   Specimen Description BLOOD LEFT ANTECUBITAL  Special Requests BOTTLES DRAWN AEROBIC AND ANAEROBIC 5CC    Culture NO GROWTH < 24 HOURS    Report Status PENDING   CBG monitoring, ED     Status: Abnormal   Collection Time: 01/10/15  8:57 PM  Result Value Ref Range   Glucose-Capillary 114 (H) 65 - 99 mg/dL   Comment 1 Notify RN    Comment 2 Document in Chart   Blood culture (routine x 2)     Status: None (Preliminary result)   Collection Time: 01/10/15  9:00 PM  Result Value Ref Range   Specimen Description BLOOD RIGHT ANTECUBITAL    Special Requests BOTTLES DRAWN AEROBIC AND ANAEROBIC 5CC    Culture NO GROWTH < 24 HOURS    Report Status PENDING   Urine culture     Status: None   Collection Time: 01/10/15  9:08 PM  Result Value Ref Range   Specimen Description URINE, CATHETERIZED    Special Requests NONE    Culture NO GROWTH 1 DAY    Report Status 01/11/2015 FINAL   Urinalysis, Routine w reflex microscopic (not at [facility])     Status: Abnormal   Collection Time: 01/10/15  9:08 PM  Result Value Ref Range   Color, Urine YELLOW YELLOW   APPearance CLEAR CLEAR   Specific Gravity, Urine 1.026 1.005 - 1.030   pH 6.0 5.0 - 8.0   Glucose, UA 100 (A) NEGATIVE  mg/dL   Hgb urine dipstick NEGATIVE NEGATIVE   Bilirubin Urine NEGATIVE NEGATIVE   Ketones, ur NEGATIVE NEGATIVE mg/dL   Protein, ur NEGATIVE NEGATIVE mg/dL   Nitrite NEGATIVE NEGATIVE   Leukocytes, UA NEGATIVE NEGATIVE    Comment: MICROSCOPIC NOT DONE ON URINES WITH NEGATIVE PROTEIN, BLOOD, LEUKOCYTES, NITRITE, OR GLUCOSE <1000 mg/dL.  I-Stat CG4 Lactic Acid, ED     Status: None   Collection Time: 01/10/15 11:36 PM  Result Value Ref Range   Lactic Acid, Venous 1.15 0.5 - 2.0 mmol/L  MRSA PCR Screening     Status: None   Collection Time: 01/11/15 12:26 AM  Result Value Ref Range   MRSA by PCR NEGATIVE NEGATIVE    Comment:        The GeneXpert MRSA Assay (FDA approved for NASAL specimens only), is one component of a comprehensive MRSA colonization surveillance program. It is not intended to diagnose MRSA infection nor to guide or monitor treatment for MRSA infections.   CBC     Status: Abnormal   Collection Time: 01/11/15  9:30 AM  Result Value Ref Range   WBC 11.2 (H) 4.0 - 10.5 K/uL   RBC 4.32 4.22 - 5.81 MIL/uL   Hemoglobin 14.2 13.0 - 17.0 g/dL   HCT 26.3 47.3 - 07.4 %   MCV 97.7 78.0 - 100.0 fL   MCH 32.9 26.0 - 34.0 pg   MCHC 33.6 30.0 - 36.0 g/dL   RDW 76.2 77.2 - 89.3 %   Platelets 190 150 - 400 K/uL  Basic metabolic panel     Status: Abnormal   Collection Time: 01/11/15  9:30 AM  Result Value Ref Range   Sodium 140 135 - 145 mmol/L   Potassium 3.9 3.5 - 5.1 mmol/L   Chloride 109 101 - 111 mmol/L   CO2 25 22 - 32 mmol/L   Glucose, Bld 106 (H) 65 - 99 mg/dL   BUN 15 6 - 20 mg/dL   Creatinine, Ser 2.91 0.61 - 1.24 mg/dL   Calcium 8.2 (L) 8.9 - 10.3 mg/dL  GFR calc non Af Amer >60 >60 mL/min   GFR calc Af Amer >60 >60 mL/min    Comment: (NOTE) The eGFR has been calculated using the CKD EPI equation. This calculation has not been validated in all clinical situations. eGFR's persistently <60 mL/min signify possible Chronic Kidney Disease.    Anion gap 6 5  - 15  Troponin I     Status: None   Collection Time: 01/11/15  9:30 AM  Result Value Ref Range   Troponin I 0.03 <0.031 ng/mL    Comment:        NO INDICATION OF MYOCARDIAL INJURY.   Strep pneumoniae urinary antigen     Status: None   Collection Time: 01/11/15  4:03 PM  Result Value Ref Range   Strep Pneumo Urinary Antigen NEGATIVE NEGATIVE    Comment:        Infection due to S. pneumoniae cannot be absolutely ruled out since the antigen present may be below the detection limit of the test.   Influenza panel by PCR (type A & B, H1N1)     Status: None   Collection Time: 01/11/15  5:36 PM  Result Value Ref Range   Influenza A By PCR NEGATIVE NEGATIVE   Influenza B By PCR NEGATIVE NEGATIVE   H1N1 flu by pcr NOT DETECTED NOT DETECTED    Comment:        The Xpert Flu assay (FDA approved for nasal aspirates or washes and nasopharyngeal swab specimens), is intended as an aid in the diagnosis of influenza and should not be used as a sole basis for treatment.      Lipid Panel     Component Value Date/Time   CHOL 126 07/31/2010 0652   TRIG 94 07/31/2010 0652   HDL 27* 07/31/2010 0652   CHOLHDL 4.7 07/31/2010 0652   VLDL 19 07/31/2010 0652   LDLCALC 80 07/31/2010 0652     Lab Results  Component Value Date   HGBA1C 5.6 08/01/2010     Lab Results  Component Value Date   LDLCALC 80 07/31/2010   CREATININE 0.95 01/11/2015     HPI :*70 y.o. male with a past medical history significant for HTN, CAD, COPD, Bipolar, Seizures, and AF not on warfarin who presents with left sided weakness.  The patient was recently admitted at [facility] for COPD exacerbation, treated with steroids, and discharged to a nursing home three days ago. Today, the nursing home called EMS to transport the patient to the ER because of fever and left sided weakness.   In the ED, the patient had fever to 102F, tachycpnea, leukocytosis, and intermittent hypoxia. The troponin was minimally  elevated, the lactic acid level was normal. A chest x-ray was clear and a CT angiogram 4 days ago (when he was admitted at [facility]) was negative for PE or pulmonary opacity. Blood cultures and urine cultures were drawn, the patient was started on broad-spectrum antibiotics, and [facility] was asked to admit.  The patient complained of chest congestion and cough. He also noted left-sided weakness, which he reports had completely resolved after his stroke.  HOSPITAL COURSE:   Sepsis from suspected pneumonia:  Suspected source pneumonia although initial chest x-ray negative. Influenza PCR negative, respiratory virus panel pending. Started on broad-spectrum antibiotics vancomycin/cefepime, blood culture negative to date after 48 hours. Sepsis protocol was initiated. Respiratory panel is pending.  Sputum culture and urine antigens are ordered. Strep pneumo negative, Legionella antigen in the urine is pending. Patient will be  discharged home on Levaquin 750 mg by mouth daily 10 days given findings of left sphenoid and right maxillary sinusitis, and will receive extended antibiotic therapy.   Left sided weakness:  No new stroke identified on MRI. Remote large right-sided stroke noted which is most likely reason for his left-sided deficits. PTOT evaluation recommended SNF. Fever may have exacerbated his symptoms. He is not noted to be on any antiplatelet agent. Started on aspirin 81 mg a day He is on a statin.  Atrial fibrillation  CHADS2Vasc 2. Not on warfarin due to recurrent falls trauma. Rate controlled without medicine.  Essential HTN:  Resume midodrine and amlodipine  COPD:  Stable. Continue home breathing treatments. Albuterol as needed.   Seizures Stable. Continue home levetiracetam and Depakote.  Elevated troponin: Suspect demand ischemia in sepsis.Trend troponin and consult Cardiology if rising.    Discharge Exam:    Blood pressure 136/60, pulse 60, temperature 99.2 F (37.3  C), temperature source Oral, resp. rate 18, height '5\' 8"'$  (1.727 m), weight 99.2 kg (218 lb 11.1 oz), SpO2 96 %.   General appearance: alert, cooperative, appears stated age and no distress Resp: Diminished air entry at the bases. Few crackles. No wheezing. No rhonchi. Cardio: regular rate and rhythm, S1, S2 normal, no murmur, click, rub or gallop GI: soft, non-tender; bowel sounds normal; no masses, no organomegaly Extremities: extremities normal, atraumatic, no cyanosis or edema Neurologic: Left-sided deficits noted. Patient does have a history of previous stroke.       Follow-up Information    Follow up with HAWKINS,EDWARD L, MD. Schedule an appointment as soon as possible for a visit in 3 days.   Specialty:  Pulmonary Disease   Contact information:   [address] [place] [address] [phone]       Signed: Reyne Dumas 01/12/2015, 9:31 AM        Time spent >45 mins

## 2015-01-12 NOTE — Progress Notes (Signed)
[place] and made them aware of staples in head

## 2015-01-12 NOTE — Progress Notes (Signed)
Report called to [place], [place]

## 2015-01-12 NOTE — Progress Notes (Signed)
[place] for cefepime/vanc Indication: rule out pneumonia  Allergies  Allergen Reactions  . Meperidine Hcl Nausea And Vomiting    Assessment: 28 yom from NH. Recently at [facility] for COPD exacerbation. Now to ED with fever, L-sided weakness. Pharmacy consulted to dose vanc/cefepime for HCAP/sepsis. Given 1x doses of Vanc/Zosyn in ED at ~2300 last night. Tmax/24h 102.1, wbc wnl. SCr 0.9 on admit, CrCl~70.  1/3 vanc>> 1/4 cefepime>> 1/3 zosyn x1  1/3 BCx2>> 1/3 UC>> 1/4 Resp virus panel>> MRSA PCR neg  Goal of Therapy:  Vancomycin trough level 15-20 mcg/ml  Plan:  Vanc 1g IV x1 given in ED; then Vanc 750mg  IV q12h Cefepime 1g IV q8h Monitor clinical progress, c/s, renal function, abx plan/LOT  Consider d/c vanc - MRSA pcr neg   Thank you Anette Guarneri, PharmD [phone] 01/12/2015 10:15 AM

## 2015-01-12 NOTE — Progress Notes (Signed)
Utilization review completed. Yaneli Keithley, RN, BSN. 

## 2015-01-13 ENCOUNTER — Other Ambulatory Visit: Payer: Self-pay

## 2015-01-14 DIAGNOSIS — J168 Pneumonia due to other specified infectious organisms: Secondary | ICD-10-CM | POA: Diagnosis not present

## 2015-01-14 DIAGNOSIS — I10 Essential (primary) hypertension: Secondary | ICD-10-CM | POA: Diagnosis not present

## 2015-01-14 DIAGNOSIS — E784 Other hyperlipidemia: Secondary | ICD-10-CM | POA: Diagnosis not present

## 2015-01-14 DIAGNOSIS — I6789 Other cerebrovascular disease: Secondary | ICD-10-CM | POA: Diagnosis not present

## 2015-01-14 LAB — RESPIRATORY VIRUS PANEL
Adenovirus: NEGATIVE
INFLUENZA A: NEGATIVE
INFLUENZA B 1: NEGATIVE
METAPNEUMOVIRUS: NEGATIVE
PARAINFLUENZA 1 A: NEGATIVE
PARAINFLUENZA 3 A: NEGATIVE
Parainfluenza 2: NEGATIVE
RESPIRATORY SYNCYTIAL VIRUS A: NEGATIVE
Respiratory Syncytial Virus B: NEGATIVE
Rhinovirus: NEGATIVE

## 2015-01-15 LAB — CULTURE, BLOOD (ROUTINE X 2)
CULTURE: NO GROWTH
Culture: NO GROWTH

## 2015-01-16 ENCOUNTER — Encounter: Payer: Self-pay | Admitting: Licensed Clinical Social Worker

## 2015-01-16 ENCOUNTER — Other Ambulatory Visit: Payer: Self-pay | Admitting: Licensed Clinical Social Worker

## 2015-01-16 NOTE — Patient Outreach (Signed)
Assessment:  [name] received referral on Jeffrey Sweeney. [name] completed chart review on client.  Client sees Dr. Velvet Bathe as primary care physician. Client had been living alone at his home in the community. He was recently hospitalized at [facility] and later discharged that hospital to go to [facility] facility. He was hospitalized again at [facility] and discharged that hospital and transferred to [facility] in [place], [place]. D[place]line called [facility] in [place], [place] on 01/16/15.  [name] spoke via phone with client on 01/16/15. [name] verified identity of client. [name] introduced self to client and talked with client about [facility] program support. Client and [name] spoke of client needs. Client said he is receiving nursing care and physical therapy support at [facility] in [place].  Client said he has [employer]. Client said that physical therapy sessions he receives at [facility] are helpful to him. He said he has some support from his son and daughter.  Client has completed [facility] consent form. Client and [name] completed needed [facility] assessments for client. Client said he does have periodic pain in coccyx area. He has informed facility RN of this pain issue.  [name] and client spoke of client care plan. Client said that he would try to participate in all scheduled client physical therapy sessions for client at [facility] in next 30 days.  Client said he is using a rolling walker to help with ambulation.  He said he is taking medications as prescribed.  He said he enjoys talking and socializing with others.  He said he was not sure how long he would receive care at [facility].  He hopes to receive care at [facility] and then, when able, hopes to return home with needed supports in place. [name] to visit client at [facility], [place], [place] on 01/18/15. Client is in agreement with this plan. [name] thanked client for phone call with [name] on  01/16/15.  Plan:  Client to participate in scheduled physical therapy sessions for client at [place], [place] for next 30 days. [name] to conduct facility visit with client at [facility], [place], [place] on 01/18/15.   [name].Sabian Kuba MSW, LCSW Licensed Clinical Social Worker [facility] Care Management [phone]

## 2015-01-18 ENCOUNTER — Other Ambulatory Visit: Payer: Self-pay | Admitting: Licensed Clinical Social Worker

## 2015-01-18 NOTE — Patient Outreach (Signed)
[facility]) Care Management  [facility] Social Work  01/18/2015  Jeffrey Sweeney Apr 13, 1945 [id number]  Subjective:    Objective:   Current Medications:  Current Outpatient Prescriptions  Medication Sig Dispense Refill  . albuterol (PROVENTIL HFA) 108 (90 BASE) MCG/ACT inhaler Inhale 2 puffs into the lungs every 6 (six) hours as needed for wheezing or shortness of breath.    Marland Kitchen albuterol (PROVENTIL) (2.5 MG/3ML) 0.083% nebulizer solution Take 3 mLs (2.5 mg total) by nebulization every 2 (two) hours as needed for wheezing or shortness of breath. 75 mL 12  . alendronate (FOSAMAX) 10 MG tablet Take 4 tablets (40 mg total) by mouth every 7 (seven) days. Take with a full glass of water on an empty stomach.    . ALPRAZolam (XANAX) 1 MG tablet Take 1 tablet (1 mg total) by mouth 3 (three) times daily as needed. For anxiety 30 tablet 0  . amLODipine (NORVASC) 5 MG tablet Take 5 mg by mouth daily.    Marland Kitchen aspirin EC 81 MG tablet Take 1 tablet (81 mg total) by mouth daily. 30 tablet 2  . atorvastatin (LIPITOR) 40 MG tablet Take 40 mg by mouth at bedtime.     . budesonide-formoterol (SYMBICORT) 160-4.5 MCG/ACT inhaler Inhale 2 puffs into the lungs 2 (two) times daily. 1 Inhaler 12  . Cholecalciferol (VITAMIN D) 2000 units CAPS Take 1 capsule by mouth daily.    Marland Kitchen levETIRAcetam (KEPPRA) 1000 MG tablet Take 1,000 mg by mouth 3 (three) times daily.    Marland Kitchen levofloxacin (LEVAQUIN) 750 MG tablet Take 1 tablet (750 mg total) by mouth daily. 10 tablet 0  . midodrine (PROAMATINE) 5 MG tablet Take 1 tablet (5 mg total) by mouth 3 (three) times daily with meals.    . nitroGLYCERIN (NITROSTAT) 0.4 MG SL tablet Place 0.4 mg under the tongue every 5 (five) minutes as needed. Chest pain    . oxyCODONE (OXY IR/ROXICODONE) 5 MG immediate release tablet Take 1 tablet (5 mg total) by mouth every 4 (four) hours as needed for moderate pain. 30 tablet 0  . pantoprazole (PROTONIX) 40 MG tablet Take 40 mg by mouth  daily.      . potassium chloride SA (K-DUR,KLOR-CON) 20 MEQ tablet Take 20 mEq by mouth 3 (three) times daily.     . predniSONE (DELTASONE) 20 MG tablet Take 2 tablets (40 mg total) by mouth daily with breakfast.    . tiotropium (SPIRIVA) 18 MCG inhalation capsule Place 1 capsule (18 mcg total) into inhaler and inhale daily. 30 capsule 12  . Valproic Acid (DEPAKENE) 250 MG/5ML SYRP syrup Take 250 mg by mouth 2 (two) times daily.      No current facility-administered medications for this visit.    Functional Status:  In your present state of health, do you have any difficulty performing the following activities: 01/16/2015 01/11/2015  Hearing? Jeffrey Sweeney  Vision? Y N  Difficulty concentrating or making decisions? Jeffrey Sweeney  Walking or climbing stairs? Y Y  Dressing or bathing? Y N  Doing errands, shopping? Jeffrey Sweeney  Preparing Food and eating ? Y -  Using the Toilet? Y -  In the past six months, have you accidently leaked urine? N -  Do you have problems with loss of bowel control? N -  Managing your Medications? Y -  Managing your Finances? Y -  Housekeeping or managing your Housekeeping? Y -    Fall/Depression Screening:  PHQ 2/9 Scores 01/16/2015  PHQ - 2  Score 3  PHQ- 9 Score 8    Assessment:   [name] traveled to [place], [place] on 01/18/15 to meet with client at skilled nursing facility in [facility]. [name] met with client on 01/18/15 at [facility] in [place], [place]. Patient assessed in  [place] for continued care needs. [name] will continue to collaborate with the skilled nursing facility social worker to facilitate discharge planning needs and communicate with the patient and family.  Client said he is participating in physical therapy sessions for client at facility. He said these sessions are helping him.  [name] spoke with client about client care plan. Client said he will try to participate in all scheduled physical therapy sessions for client at facility.  Client said he has  family support from his sons and from his sister.  His sister, Jeffrey Sweeney, is talking with client about assisting client upon client discharge from facility (when client is able to discharge).  Client said he is taking medications as prescribed.  He said he is taking a prescribed pain medication.  He spoke of his recent fall and rib fractures. Client said he fell at his home two weeks ago.  Client is using oxygen via nasal canula continuously as prescribed at [facility] facility. Client said he was not sure of how long he would reside at facility. He said physical therapy sessions are helpful to him and he is still having left sided pain in rib area.  He has talked with RN at facility about his left sided pain. Client said he breathing fairly well at present but does use oxygen continuously as prescribed.  Client said he has had visits from his sons at facility. Client said he has [employer].  Client uses walker to help him ambulate.  [name] gave client [facility] [name] card and encouraged [name] to call [name] at [phone] to discuss social work needs of client. Client was very appreciative of visit of [name] with client on 01/18/15.   Plan:   Client to participate in scheduled physical therapy sessions for client for next 30 days at [facility] facility. [name] to call client in three weeks to assess needs of client at that time.   [name] MSW, LCSW Licensed Clinical Social Worker [facility] Care Management [phone]

## 2015-02-06 ENCOUNTER — Other Ambulatory Visit: Payer: Self-pay | Admitting: Licensed Clinical Social Worker

## 2015-02-06 DIAGNOSIS — I482 Chronic atrial fibrillation: Secondary | ICD-10-CM | POA: Diagnosis not present

## 2015-02-06 DIAGNOSIS — J189 Pneumonia, unspecified organism: Secondary | ICD-10-CM | POA: Diagnosis not present

## 2015-02-06 DIAGNOSIS — I10 Essential (primary) hypertension: Secondary | ICD-10-CM | POA: Diagnosis not present

## 2015-02-06 DIAGNOSIS — I69354 Hemiplegia and hemiparesis following cerebral infarction affecting left non-dominant side: Secondary | ICD-10-CM | POA: Diagnosis not present

## 2015-02-06 DIAGNOSIS — J440 Chronic obstructive pulmonary disease with acute lower respiratory infection: Secondary | ICD-10-CM | POA: Diagnosis not present

## 2015-02-06 NOTE — Patient Outreach (Signed)
[facility]) Care Management  [facility] Social Work  02/06/2015  EMMETT SCHURTZ Nov 14, 1945 [id number]  Subjective:    Objective:   Current Medications:  Current Outpatient Prescriptions  Medication Sig Dispense Refill  . albuterol (PROVENTIL HFA) 108 (90 BASE) MCG/ACT inhaler Inhale 2 puffs into the lungs every 6 (six) hours as needed for wheezing or shortness of breath.    Marland Kitchen albuterol (PROVENTIL) (2.5 MG/3ML) 0.083% nebulizer solution Take 3 mLs (2.5 mg total) by nebulization every 2 (two) hours as needed for wheezing or shortness of breath. 75 mL 12  . alendronate (FOSAMAX) 10 MG tablet Take 4 tablets (40 mg total) by mouth every 7 (seven) days. Take with a full glass of water on an empty stomach.    . ALPRAZolam (XANAX) 1 MG tablet Take 1 tablet (1 mg total) by mouth 3 (three) times daily as needed. For anxiety 30 tablet 0  . amLODipine (NORVASC) 5 MG tablet Take 5 mg by mouth daily.    Marland Kitchen aspirin EC 81 MG tablet Take 1 tablet (81 mg total) by mouth daily. 30 tablet 2  . atorvastatin (LIPITOR) 40 MG tablet Take 40 mg by mouth at bedtime.     . budesonide-formoterol (SYMBICORT) 160-4.5 MCG/ACT inhaler Inhale 2 puffs into the lungs 2 (two) times daily. 1 Inhaler 12  . Cholecalciferol (VITAMIN D) 2000 units CAPS Take 1 capsule by mouth daily.    Marland Kitchen levETIRAcetam (KEPPRA) 1000 MG tablet Take 1,000 mg by mouth 3 (three) times daily.    Marland Kitchen levofloxacin (LEVAQUIN) 750 MG tablet Take 1 tablet (750 mg total) by mouth daily. 10 tablet 0  . midodrine (PROAMATINE) 5 MG tablet Take 1 tablet (5 mg total) by mouth 3 (three) times daily with meals.    . nitroGLYCERIN (NITROSTAT) 0.4 MG SL tablet Place 0.4 mg under the tongue every 5 (five) minutes as needed. Chest pain    . oxyCODONE (OXY IR/ROXICODONE) 5 MG immediate release tablet Take 1 tablet (5 mg total) by mouth every 4 (four) hours as needed for moderate pain. 30 tablet 0  . pantoprazole (PROTONIX) 40 MG tablet Take 40 mg by mouth  daily.      . potassium chloride SA (K-DUR,KLOR-CON) 20 MEQ tablet Take 20 mEq by mouth 3 (three) times daily.     . predniSONE (DELTASONE) 20 MG tablet Take 2 tablets (40 mg total) by mouth daily with breakfast.    . tiotropium (SPIRIVA) 18 MCG inhalation capsule Place 1 capsule (18 mcg total) into inhaler and inhale daily. 30 capsule 12  . Valproic Acid (DEPAKENE) 250 MG/5ML SYRP syrup Take 250 mg by mouth 2 (two) times daily.      No current facility-administered medications for this visit.    Functional Status:  In your present state of health, do you have any difficulty performing the following activities: 01/16/2015 01/11/2015  Hearing? [name]  Vision? Y N  Difficulty concentrating or making decisions? [name]  Walking or climbing stairs? Y Y  Dressing or bathing? Y N  Doing errands, shopping? [name]  Preparing Food and eating ? Y -  Using the Toilet? Y -  In the past six months, have you accidently leaked urine? N -  Do you have problems with loss of bowel control? N -  Managing your Medications? Y -  Managing your Finances? Y -  Housekeeping or managing your Housekeeping? Y -    Fall/Depression Screening:  PHQ 2/9 Scores 01/16/2015  PHQ - 2  Score 3  PHQ- 9 Score 8    Assessment:   [name] traveled to [facility] in [place], [place] on 02/06/15 to visit client. [name] had scheduled facility visit with client. [name] was informed by facility social worker on 02/06/15  that client had disharged this past Saturday from that nursing center and had gone to the home of his daughter in [place], [place]. [place] informed facility social worker that [facility] had not received any communication regarding client discharge status.  [name] Theadore Nan had mentioned to facility SW on last visit of [name] with client at facility that [name] was working with client.  Social Worker at facility did not apologize for lack of communication of facility social worker with [facility] informed social worker that he had driven one hour to visit client at facility on 02/06/15.  [name] then called client on 02/06/15 at home number of Lucia Estelle, daughter of client.  [name] and client spoke via phone n 02/06/15.  [name] verified client identity.  Client gave [name] verbal permission on 02/06/15 for [name] to talk to [employer], as needed, regarding client needs. Client said he is eating well and sleeping well. Client and [name] spoke of client care plan. Client said that he had received scheduled physical therapy sessions for client at facility. Client said he discharged from nursing facilty this past Saturday. Client is scheduled to receive in home physical therapy sessions as scheduled through [facility]. Client said that physical therapist from [facility] is scheduled to visit him today at home of his daughter. Client said he has a rollator walker heruses to help him walk.   Client said he has his prescribed medications and is taking medicatoins as prescribed. Client and [name] spoke of client care plan. [name] encouraged client to participate in scheduled in home phy\ical therapy sessions of client with [facility] representative. [name] thanked client for phone call with [name] on 02/06/15.   Plan: Client to participate in all scheduled physical therapy sessions for client with [facility] representative in next 30 days. [name] to call client in three weeks to assess client needs.  [name].Tavone Caesar MSW, LCSW Licensed Clinical Social Worker [facility] Care Management [phone]

## 2015-02-07 ENCOUNTER — Ambulatory Visit: Payer: Self-pay | Admitting: Licensed Clinical Social Worker

## 2015-02-07 DIAGNOSIS — I10 Essential (primary) hypertension: Secondary | ICD-10-CM | POA: Diagnosis not present

## 2015-02-07 DIAGNOSIS — J189 Pneumonia, unspecified organism: Secondary | ICD-10-CM | POA: Diagnosis not present

## 2015-02-07 DIAGNOSIS — I69354 Hemiplegia and hemiparesis following cerebral infarction affecting left non-dominant side: Secondary | ICD-10-CM | POA: Diagnosis not present

## 2015-02-07 DIAGNOSIS — I482 Chronic atrial fibrillation: Secondary | ICD-10-CM | POA: Diagnosis not present

## 2015-02-07 DIAGNOSIS — J440 Chronic obstructive pulmonary disease with acute lower respiratory infection: Secondary | ICD-10-CM | POA: Diagnosis not present

## 2015-02-09 DIAGNOSIS — I69354 Hemiplegia and hemiparesis following cerebral infarction affecting left non-dominant side: Secondary | ICD-10-CM | POA: Diagnosis not present

## 2015-02-09 DIAGNOSIS — J189 Pneumonia, unspecified organism: Secondary | ICD-10-CM | POA: Diagnosis not present

## 2015-02-09 DIAGNOSIS — I482 Chronic atrial fibrillation: Secondary | ICD-10-CM | POA: Diagnosis not present

## 2015-02-09 DIAGNOSIS — J440 Chronic obstructive pulmonary disease with acute lower respiratory infection: Secondary | ICD-10-CM | POA: Diagnosis not present

## 2015-02-09 DIAGNOSIS — I10 Essential (primary) hypertension: Secondary | ICD-10-CM | POA: Diagnosis not present

## 2015-02-14 DIAGNOSIS — I69354 Hemiplegia and hemiparesis following cerebral infarction affecting left non-dominant side: Secondary | ICD-10-CM | POA: Diagnosis not present

## 2015-02-14 DIAGNOSIS — J440 Chronic obstructive pulmonary disease with acute lower respiratory infection: Secondary | ICD-10-CM | POA: Diagnosis not present

## 2015-02-14 DIAGNOSIS — J189 Pneumonia, unspecified organism: Secondary | ICD-10-CM | POA: Diagnosis not present

## 2015-02-14 DIAGNOSIS — I482 Chronic atrial fibrillation: Secondary | ICD-10-CM | POA: Diagnosis not present

## 2015-02-14 DIAGNOSIS — I10 Essential (primary) hypertension: Secondary | ICD-10-CM | POA: Diagnosis not present

## 2015-02-15 DIAGNOSIS — I69354 Hemiplegia and hemiparesis following cerebral infarction affecting left non-dominant side: Secondary | ICD-10-CM | POA: Diagnosis not present

## 2015-02-15 DIAGNOSIS — I482 Chronic atrial fibrillation: Secondary | ICD-10-CM | POA: Diagnosis not present

## 2015-02-15 DIAGNOSIS — J189 Pneumonia, unspecified organism: Secondary | ICD-10-CM | POA: Diagnosis not present

## 2015-02-15 DIAGNOSIS — I10 Essential (primary) hypertension: Secondary | ICD-10-CM | POA: Diagnosis not present

## 2015-02-15 DIAGNOSIS — J440 Chronic obstructive pulmonary disease with acute lower respiratory infection: Secondary | ICD-10-CM | POA: Diagnosis not present

## 2015-02-16 DIAGNOSIS — I10 Essential (primary) hypertension: Secondary | ICD-10-CM | POA: Diagnosis not present

## 2015-02-16 DIAGNOSIS — J440 Chronic obstructive pulmonary disease with acute lower respiratory infection: Secondary | ICD-10-CM | POA: Diagnosis not present

## 2015-02-16 DIAGNOSIS — I69354 Hemiplegia and hemiparesis following cerebral infarction affecting left non-dominant side: Secondary | ICD-10-CM | POA: Diagnosis not present

## 2015-02-16 DIAGNOSIS — J189 Pneumonia, unspecified organism: Secondary | ICD-10-CM | POA: Diagnosis not present

## 2015-02-16 DIAGNOSIS — I482 Chronic atrial fibrillation: Secondary | ICD-10-CM | POA: Diagnosis not present

## 2015-02-17 DIAGNOSIS — I69354 Hemiplegia and hemiparesis following cerebral infarction affecting left non-dominant side: Secondary | ICD-10-CM | POA: Diagnosis not present

## 2015-02-17 DIAGNOSIS — I482 Chronic atrial fibrillation: Secondary | ICD-10-CM | POA: Diagnosis not present

## 2015-02-17 DIAGNOSIS — I10 Essential (primary) hypertension: Secondary | ICD-10-CM | POA: Diagnosis not present

## 2015-02-17 DIAGNOSIS — J189 Pneumonia, unspecified organism: Secondary | ICD-10-CM | POA: Diagnosis not present

## 2015-02-17 DIAGNOSIS — J440 Chronic obstructive pulmonary disease with acute lower respiratory infection: Secondary | ICD-10-CM | POA: Diagnosis not present

## 2015-02-20 DIAGNOSIS — J189 Pneumonia, unspecified organism: Secondary | ICD-10-CM | POA: Diagnosis not present

## 2015-02-20 DIAGNOSIS — I69354 Hemiplegia and hemiparesis following cerebral infarction affecting left non-dominant side: Secondary | ICD-10-CM | POA: Diagnosis not present

## 2015-02-20 DIAGNOSIS — I482 Chronic atrial fibrillation: Secondary | ICD-10-CM | POA: Diagnosis not present

## 2015-02-20 DIAGNOSIS — I10 Essential (primary) hypertension: Secondary | ICD-10-CM | POA: Diagnosis not present

## 2015-02-20 DIAGNOSIS — J440 Chronic obstructive pulmonary disease with acute lower respiratory infection: Secondary | ICD-10-CM | POA: Diagnosis not present

## 2015-02-21 DIAGNOSIS — I69354 Hemiplegia and hemiparesis following cerebral infarction affecting left non-dominant side: Secondary | ICD-10-CM | POA: Diagnosis not present

## 2015-02-21 DIAGNOSIS — I10 Essential (primary) hypertension: Secondary | ICD-10-CM | POA: Diagnosis not present

## 2015-02-21 DIAGNOSIS — J189 Pneumonia, unspecified organism: Secondary | ICD-10-CM | POA: Diagnosis not present

## 2015-02-21 DIAGNOSIS — I482 Chronic atrial fibrillation: Secondary | ICD-10-CM | POA: Diagnosis not present

## 2015-02-21 DIAGNOSIS — J440 Chronic obstructive pulmonary disease with acute lower respiratory infection: Secondary | ICD-10-CM | POA: Diagnosis not present

## 2015-02-22 ENCOUNTER — Other Ambulatory Visit: Payer: Self-pay | Admitting: *Deleted

## 2015-02-22 NOTE — Patient Outreach (Signed)
Per 1/3/017 note by [facility] SW, Theadore Nan, patient discharged from Skilled facility Call to patient for transition of care call  Spoke with patient. Patient states he is doing ok with his sister, Betty's help, she is coming over every other day. He states he was at his daughter's home but she works a lot and he felt he needed to try and come home. He states his sister is assisting him with his medications. He states he has home care and he is getting PT, nurse, SW  and bath aide.  Patient agrees to home visit but requests RNCM call his sister Alcide Clever [phone] to schedule, when sister can be at visit. Plan: [facility] CM Care Plan Problem One        Most Recent Value   Care Plan Problem One  Client will participate in physical therapy sessions at nursing facility.     Role Documenting the Problem One  Clinical Social Worker   Care Plan for Problem One  Active   THN Long Term Goal (31-90 days)  Patient will be able toself manage at honme with assitance of family over the next 60 days   THN Long Term Goal Start Date  02/22/15   Interventions for Problem One Long Term Goal  Transition of care program started, scheduled home visit with patient and his sister for next week for assessment   THN CM Short Term Goal #1 (0-30 days)  Self management needs as evidenced by recent skilled nursing stay   [facility] CM Short Term Goal #1 Start Date  02/22/15   Interventions for Short Term Goal #1  patient will not need readmission to Skilled over the next 21 days     Plan to see patient next week and continue in transition of care program. Plan to contact MD office to schedule post SNF follow up appointment. Will call sister to scheduled appointment for next week. Royetta Crochet. Laymond Purser, RN, BSN, [place] [phone]

## 2015-02-22 NOTE — Patient Outreach (Signed)
Call to patient sister and discussed home visit Plan to see next week. Royetta Crochet. Laymond Purser, RN, BSN, [place] [phone]

## 2015-02-23 DIAGNOSIS — I482 Chronic atrial fibrillation: Secondary | ICD-10-CM | POA: Diagnosis not present

## 2015-02-23 DIAGNOSIS — I69354 Hemiplegia and hemiparesis following cerebral infarction affecting left non-dominant side: Secondary | ICD-10-CM | POA: Diagnosis not present

## 2015-02-23 DIAGNOSIS — I10 Essential (primary) hypertension: Secondary | ICD-10-CM | POA: Diagnosis not present

## 2015-02-23 DIAGNOSIS — J189 Pneumonia, unspecified organism: Secondary | ICD-10-CM | POA: Diagnosis not present

## 2015-02-23 DIAGNOSIS — J440 Chronic obstructive pulmonary disease with acute lower respiratory infection: Secondary | ICD-10-CM | POA: Diagnosis not present

## 2015-02-27 ENCOUNTER — Other Ambulatory Visit: Payer: Self-pay | Admitting: *Deleted

## 2015-02-27 ENCOUNTER — Other Ambulatory Visit: Payer: Self-pay | Admitting: Licensed Clinical Social Worker

## 2015-02-27 NOTE — Patient Outreach (Signed)
Call to MD office, left VM regarding MD appointment for patient.  Call back to [facility], she has made appointment for patient next week.  Call to patient to verify appointment. He has appointment on Monday. He verifies appointment with [facility] for Wednesday 03/01/15  Plan to see patient later this week. Jeffrey Sweeney. Laymond Purser, RN, BSN, [place] [phone]

## 2015-02-27 NOTE — Patient Outreach (Signed)
Assessment:  [name] called client home phone number on 02/27/15.[name] spoke via phone with client on 02/27/15.  [name] verified identity of client. [name] and client spoke of client needs.Client has been staying at his home for about one month. Client said he receives in home physical therapy sessions weekly as scheduled with [facility].  He said he thinks physical therapy sessions have been helpful to client.  Physical therapist has been conducting in home physical therapy sessions with client 2 times weekly.  Client said he has all his prescribed medications and is taking medications as prescribed.  He has support from his daughter and from his sister.  He said he is eating well.  He said family and friends are bringing him food to eat and this is very helpful.  He said his sister will help transport him as needed to upcoming medical appointments for client.  Client said this his sister, [name], had spoken previously with [facility] RN Burgess Amor about client nursing needs.  [name] and client spoke of client care plan. Client agreed that he would participate in all scheduled in home physical therapy sessions for client with [facility] in next 30 days.  He also agreed that he would attend all scheduled client medical appointments in next 30 days.   [name] thanked client for phone call on 02/27/15.  [name] encouraged client to call [name] at [phone] as needed to discuss social work needs of client.  Plan: Client to participate in all scheduled in home physical therapy sessions for client with [facility] in next 30 days. Client to attend all scheduled client medical appointments in next 30 days. [name] to collaborate as needed with RN Burgess Amor regarding ongoing client needs. [name] to call client in 4 weeks to assess client needs.  [name].Billyjack Trompeter MSW, LCSW Licensed Clinical Social Worker [facility] Care Management [phone]

## 2015-03-01 ENCOUNTER — Other Ambulatory Visit: Payer: Self-pay | Admitting: *Deleted

## 2015-03-01 ENCOUNTER — Encounter: Payer: Self-pay | Admitting: *Deleted

## 2015-03-01 DIAGNOSIS — I130 Hypertensive heart and chronic kidney disease with heart failure and stage 1 through stage 4 chronic kidney disease, or unspecified chronic kidney disease: Secondary | ICD-10-CM | POA: Diagnosis not present

## 2015-03-01 DIAGNOSIS — J449 Chronic obstructive pulmonary disease, unspecified: Secondary | ICD-10-CM | POA: Diagnosis not present

## 2015-03-01 DIAGNOSIS — I10 Essential (primary) hypertension: Secondary | ICD-10-CM | POA: Diagnosis not present

## 2015-03-01 DIAGNOSIS — E1040 Type 1 diabetes mellitus with diabetic neuropathy, unspecified: Secondary | ICD-10-CM | POA: Diagnosis not present

## 2015-03-01 DIAGNOSIS — I69354 Hemiplegia and hemiparesis following cerebral infarction affecting left non-dominant side: Secondary | ICD-10-CM | POA: Diagnosis not present

## 2015-03-01 DIAGNOSIS — E1022 Type 1 diabetes mellitus with diabetic chronic kidney disease: Secondary | ICD-10-CM | POA: Diagnosis not present

## 2015-03-01 DIAGNOSIS — I482 Chronic atrial fibrillation: Secondary | ICD-10-CM | POA: Diagnosis not present

## 2015-03-01 DIAGNOSIS — J189 Pneumonia, unspecified organism: Secondary | ICD-10-CM | POA: Diagnosis not present

## 2015-03-01 DIAGNOSIS — J440 Chronic obstructive pulmonary disease with acute lower respiratory infection: Secondary | ICD-10-CM | POA: Diagnosis not present

## 2015-03-01 NOTE — Patient Outreach (Signed)
[facility]) Care Management   03/01/2015  Jeffrey Sweeney 08-Sep-1945 [id number]  Jeffrey Sweeney is an 70 y.o. male   Co-visit with patient, sister, Jeffrey Sweeney, niece Jeffrey Sweeney and [place], Jeffrey Sweeney  Subjective:  Patient with no complaints today. He states he is still participating with [facility] and feels this is helping him get stronger and be less unsteady.  Patient has list of medications from daughter, shows RNCM bag of medications and list of questions to take to MD next week.  Sister states she is assisting patient with meals, "we don't let him cook". She states she is there every other day to assist patient as needed.  Also, other family is close by and checks in frequently.  They want to keep patient home and out of the skilled, voiced some disappointment in care that patient received while in skilled nursing facility.  Objective:   BP 120/64 mmHg  Pulse 92  Resp 20  Wt 212 lb (96.163 kg)  SpO2 96% Review of Systems  Constitutional: Negative.   HENT: Negative.   Eyes: Positive for blurred vision.       States he has cataracts   Respiratory: Negative.   Cardiovascular: Negative.   Gastrointestinal: Negative.   Genitourinary: Negative.   Musculoskeletal: Negative.   Skin: Negative.   Neurological: Positive for tingling.       Left leg weak from past stroke  Psychiatric/Behavioral: The patient is nervous/anxious.        GAD     Physical Exam  Constitutional: He is oriented to person, place, and time. He appears well-developed.  Cardiovascular: Normal rate.   GI: Soft. Bowel sounds are normal.  Musculoskeletal: Normal range of motion.  Neurological: He is alert and oriented to person, place, and time.  Skin: Skin is warm.  Psychiatric: He has a normal mood and affect. His behavior is normal. Judgment and thought content normal.    Current Medications:   Current Outpatient Prescriptions  Medication Sig Dispense Refill  . amLODipine (NORVASC) 5  MG tablet Take 5 mg by mouth daily.    Marland Kitchen aspirin EC 81 MG tablet Take 1 tablet (81 mg total) by mouth daily. 30 tablet 2  . atorvastatin (LIPITOR) 40 MG tablet Take 40 mg by mouth at bedtime.     . Cholecalciferol (VITAMIN D) 2000 units CAPS Take 1 capsule by mouth daily.    Marland Kitchen levETIRAcetam (KEPPRA) 1000 MG tablet Take 1,000 mg by mouth 3 (three) times daily.    . nitroGLYCERIN (NITROSTAT) 0.4 MG SL tablet Place 0.4 mg under the tongue every 5 (five) minutes as needed. Chest pain    . pantoprazole (PROTONIX) 40 MG tablet Take 40 mg by mouth daily.      . potassium chloride SA (K-DUR,KLOR-CON) 20 MEQ tablet Take 20 mEq by mouth 3 (three) times daily.     Marland Kitchen tiotropium (SPIRIVA) 18 MCG inhalation capsule Place 1 capsule (18 mcg total) into inhaler and inhale daily. 30 capsule 12  . Valproic Acid (DEPAKENE) 250 MG/5ML SYRP syrup Take 250 mg by mouth 2 (two) times daily.     Marland Kitchen albuterol (PROVENTIL HFA) 108 (90 BASE) MCG/ACT inhaler Inhale 2 puffs into the lungs every 6 (six) hours as needed for wheezing or shortness of breath.    Marland Kitchen albuterol (PROVENTIL) (2.5 MG/3ML) 0.083% nebulizer solution Take 3 mLs (2.5 mg total) by nebulization every 2 (two) hours as needed for wheezing or shortness of breath. 75 mL 12  .  alendronate (FOSAMAX) 10 MG tablet Take 4 tablets (40 mg total) by mouth every 7 (seven) days. Take with a full glass of water on an empty stomach.    . ALPRAZolam (XANAX) 1 MG tablet Take 1 tablet (1 mg total) by mouth 3 (three) times daily as needed. For anxiety 30 tablet 0  . budesonide-formoterol (SYMBICORT) 160-4.5 MCG/ACT inhaler Inhale 2 puffs into the lungs 2 (two) times daily. (Patient not taking: Reported on 03/01/2015) 1 Inhaler 12  . levofloxacin (LEVAQUIN) 750 MG tablet Take 1 tablet (750 mg total) by mouth daily. (Patient not taking: Reported on 03/01/2015) 10 tablet 0  . midodrine (PROAMATINE) 5 MG tablet Take 1 tablet (5 mg total) by mouth 3 (three) times daily with meals.    Marland Kitchen  oxyCODONE (OXY IR/ROXICODONE) 5 MG immediate release tablet Take 1 tablet (5 mg total) by mouth every 4 (four) hours as needed for moderate pain. (Patient not taking: Reported on 03/01/2015) 30 tablet 0  . predniSONE (DELTASONE) 20 MG tablet Take 2 tablets (40 mg total) by mouth daily with breakfast. (Patient not taking: Reported on 03/01/2015)     No current facility-administered medications for this visit.    Functional Status:   In your present state of health, do you have any difficulty performing the following activities: 03/01/2015 02/22/2015  Hearing? N -  Vision? N -  Difficulty concentrating or making decisions? Y -  Walking or climbing stairs? Y -  Dressing or bathing? Y -  Doing errands, shopping? Y -  Conservation officer, nature and eating ? N -  Using the Toilet? N -  In the past six months, have you accidently leaked urine? N -  Do you have problems with loss of bowel control? N -  Managing your Medications? Y Y  Managing your Finances? [name]  Housekeeping or managing your Housekeeping? Y Y    Fall/Depression Screening:    Fall Risk  03/01/2015 02/06/2015 02/06/2015 01/16/2015  Falls in the past year? Yes Yes Yes Yes  Number falls in past yr: - 2 or more 2 or more 2 or more  Injury with Fall? Yes Yes Yes Yes  Risk Factor Category  - High Fall Risk High Fall Risk High Fall Risk  Risk for fall due to : History of fall(s);Impaired balance/gait History of fall(s);Impaired balance/gait;Impaired mobility History of fall(s);Impaired balance/gait;Impaired mobility History of fall(s);Impaired balance/gait;Impaired mobility  Follow up Falls prevention discussed Falls prevention discussed Falls prevention discussed Falls prevention discussed   PHQ 2/9 Scores 03/01/2015 02/06/2015 02/06/2015 01/16/2015  PHQ - 2 Score 0 3 3 3   PHQ- 9 Score - 8 8 8     Assessment:   Safety: Patient needs assistance with ADL's and IADL's this is being provided by sister and daughter Self Management: Needs support from family  to remain home Medication management: Patient daughter is managing med box for patient weekly and patient able to take medications out of box. Question on midodrine, will call MD office Plan:  [facility] CM Care Plan Problem One        Most Recent Value   Care Plan Problem One  high risk for readmission as evidenced by recent hospitaliation and Skilled nursing stay   Role Documenting the Problem One  Care Management [place] for Problem One  Active   THN Long Term Goal (31-90 days)  Patient will be able toself manage at honme with assitance of family over the next 60 days   THN Long Term Goal  Start Date  03/01/15   Interventions for Problem One Long Term Goal  Transition of care program calls and visits, Coordinate care with Home Care and THN SW   THN CM Short Term Goal #1 (0-30 days)  Self management needs as evidenced by recent skilled nursing stay   [facility] CM Short Term Goal #1 Start Date  02/22/15   Interventions for Short Term Goal #1  Coordinate care with Home Care agency, Co visit with Alvis Lemmings SW today, encourage patient to continue to work with PT, Assessed for care needs and deficits with patient and family during visit     Patient sister will transport patient to MD office for medical appointment. Alvis Lemmings SW will get financial information for patient regarding Medicare and Legal Aide RNCM will coordinate care with [facility] LCSW, Theadore Nan and relay information of [place] SW RNCM call to Hanna at MD office regarding Midodrine, gave pharmacy information. RNCM will visit again March and continue Transition of Care calls  Royetta Crochet. Laymond Purser, RN, BSN, [place] [phone]

## 2015-03-02 ENCOUNTER — Encounter: Payer: Self-pay | Admitting: *Deleted

## 2015-03-02 ENCOUNTER — Other Ambulatory Visit: Payer: Self-pay | Admitting: *Deleted

## 2015-03-02 DIAGNOSIS — I69354 Hemiplegia and hemiparesis following cerebral infarction affecting left non-dominant side: Secondary | ICD-10-CM | POA: Diagnosis not present

## 2015-03-02 DIAGNOSIS — J189 Pneumonia, unspecified organism: Secondary | ICD-10-CM | POA: Diagnosis not present

## 2015-03-02 DIAGNOSIS — I482 Chronic atrial fibrillation: Secondary | ICD-10-CM | POA: Diagnosis not present

## 2015-03-02 DIAGNOSIS — I10 Essential (primary) hypertension: Secondary | ICD-10-CM | POA: Diagnosis not present

## 2015-03-02 DIAGNOSIS — J440 Chronic obstructive pulmonary disease with acute lower respiratory infection: Secondary | ICD-10-CM | POA: Diagnosis not present

## 2015-03-02 NOTE — Patient Outreach (Signed)
Call to [facility] at MD office to discuss medication midodrine. She will give message to MD and nurse and call in new prescription to [place] if he needs to continue medication.  Call to patient sister, [name] gave above information. Also, she requested RNCM change scheduled appointment to another day as she could not be at visit for 3/8. Rescheduled visit for 3/13.  Plan to continue transition of care program calls. Royetta Crochet. Laymond Purser, RN, BSN, [place] [phone]

## 2015-03-06 DIAGNOSIS — R569 Unspecified convulsions: Secondary | ICD-10-CM | POA: Diagnosis not present

## 2015-03-06 DIAGNOSIS — I10 Essential (primary) hypertension: Secondary | ICD-10-CM | POA: Diagnosis not present

## 2015-03-06 DIAGNOSIS — I951 Orthostatic hypotension: Secondary | ICD-10-CM | POA: Diagnosis not present

## 2015-03-06 DIAGNOSIS — J449 Chronic obstructive pulmonary disease, unspecified: Secondary | ICD-10-CM | POA: Diagnosis not present

## 2015-03-07 DIAGNOSIS — I10 Essential (primary) hypertension: Secondary | ICD-10-CM | POA: Diagnosis not present

## 2015-03-07 DIAGNOSIS — I482 Chronic atrial fibrillation: Secondary | ICD-10-CM | POA: Diagnosis not present

## 2015-03-07 DIAGNOSIS — J440 Chronic obstructive pulmonary disease with acute lower respiratory infection: Secondary | ICD-10-CM | POA: Diagnosis not present

## 2015-03-07 DIAGNOSIS — J189 Pneumonia, unspecified organism: Secondary | ICD-10-CM | POA: Diagnosis not present

## 2015-03-07 DIAGNOSIS — I69354 Hemiplegia and hemiparesis following cerebral infarction affecting left non-dominant side: Secondary | ICD-10-CM | POA: Diagnosis not present

## 2015-03-08 ENCOUNTER — Other Ambulatory Visit: Payer: Self-pay | Admitting: *Deleted

## 2015-03-08 NOTE — Patient Outreach (Signed)
Message left for RNCM to call patient or sister [name]. Call back to patient, no answer, left VM with RNCM contact. Plan to attempt to call again later. Royetta Crochet. [place], RN, BSN, [place] [phone]

## 2015-03-09 ENCOUNTER — Other Ambulatory Visit: Payer: Self-pay | Admitting: *Deleted

## 2015-03-09 DIAGNOSIS — J440 Chronic obstructive pulmonary disease with acute lower respiratory infection: Secondary | ICD-10-CM | POA: Diagnosis not present

## 2015-03-09 DIAGNOSIS — J189 Pneumonia, unspecified organism: Secondary | ICD-10-CM | POA: Diagnosis not present

## 2015-03-09 DIAGNOSIS — I10 Essential (primary) hypertension: Secondary | ICD-10-CM | POA: Diagnosis not present

## 2015-03-09 DIAGNOSIS — I69354 Hemiplegia and hemiparesis following cerebral infarction affecting left non-dominant side: Secondary | ICD-10-CM | POA: Diagnosis not present

## 2015-03-09 DIAGNOSIS — I482 Chronic atrial fibrillation: Secondary | ICD-10-CM | POA: Diagnosis not present

## 2015-03-09 NOTE — Patient Outreach (Signed)
Transition of Care call Spoke with patient and sister. Patient reports that MD called in the midodrine and reports daughter has filled med box with medication, he verbalizes that it is for his low BP, "to keep me from passing out".  Patient denies any breathing issues, states he is on a new inhaler, but denies any issues with his breathing this call. RNCM reviewed Inhaler and discussed usage, patient verbalized understanding.  Patient reports he still has [facility] care, PT was out to home today. Patient sister is preparing meals and providing oversight for patient. Patient sister expresses that she would like to see patient sell his home and move closer to town to an apartment, something for patient to consider in the future.  THN CM Care Plan Problem One        Most Recent Value   Care Plan Problem One  high risk for readmission as evidenced by recent hospitaliation and Skilled nursing stay   Role Documenting the Problem One  Care Management [place] for Problem One  Active   THN Long Term Goal (31-90 days)  Patient will be able toself manage at honme with assitance of family over the next 60 days   THN Long Term Goal Start Date  03/01/15   Interventions for Problem One Long Term Goal  Transition of care calls. Reeducated patient on signs and symptoms to self monitor around AFIB and COPD   THN CM Short Term Goal #1 (0-30 days)  Self management needs as evidenced by recent skilled nursing stay   [facility] CM Short Term Goal #1 Start Date  02/22/15   Interventions for Short Term Goal #1  Using teachback reviewed PT services, encouraged patient to participate in PT and perform exercises on his own to build up strength. Verified patient sister is still assisting patient with meals and  providing some oversight.      Patient and sister will call RNCM with any issues or concerns. Plan to continue transition of care calls and will visit later in month. Royetta Crochet. Laymond Purser, RN, BSN, [place] [phone]

## 2015-03-15 ENCOUNTER — Ambulatory Visit: Payer: Self-pay | Admitting: *Deleted

## 2015-03-16 ENCOUNTER — Other Ambulatory Visit: Payer: Self-pay | Admitting: *Deleted

## 2015-03-16 DIAGNOSIS — J189 Pneumonia, unspecified organism: Secondary | ICD-10-CM | POA: Diagnosis not present

## 2015-03-16 DIAGNOSIS — I482 Chronic atrial fibrillation: Secondary | ICD-10-CM | POA: Diagnosis not present

## 2015-03-16 DIAGNOSIS — J440 Chronic obstructive pulmonary disease with acute lower respiratory infection: Secondary | ICD-10-CM | POA: Diagnosis not present

## 2015-03-16 DIAGNOSIS — I69354 Hemiplegia and hemiparesis following cerebral infarction affecting left non-dominant side: Secondary | ICD-10-CM | POA: Diagnosis not present

## 2015-03-16 DIAGNOSIS — I10 Essential (primary) hypertension: Secondary | ICD-10-CM | POA: Diagnosis not present

## 2015-03-16 NOTE — Patient Outreach (Signed)
Transition of care call  Spoke with patient, patient states he is not feeling too well, he says he has the flu, thinks he picked it up when he was at the MD office 03/06/15. He is still getting [place] services, nurse assessed lungs today and told him he was clear. Denies any new issues or concerns for RNCM this call. Patient agrees to [facility] home visit next week.  Plan:  [facility] CM Care Plan Problem One        Most Recent Value   Care Plan Problem One  high risk for readmission as evidenced by recent hospitaliation and Skilled nursing stay   Role Documenting the Problem One  Care Management [place] for Problem One  Active   THN Long Term Goal (31-90 days)  Patient will be able toself manage at honme with assitance of family over the next 60 days   THN Long Term Goal Start Date  03/01/15   Interventions for Problem One Long Term Goal  Transition of care calls. Reeducated patient on signs/symptoms to report, instructed to drink plenty of  fluids and rest relating to recent viral infection   THN CM Short Term Goal #1 (0-30 days)  Self management needs as evidenced by recent skilled nursing stay   [facility] CM Short Term Goal #1 Start Date  02/22/15   Interventions for Short Term Goal #1  Encouraged patient in his endeavors to maintain independence.     Plan for visit next week. Royetta Crochet. Laymond Purser, RN, BSN, [place] [phone]

## 2015-03-17 DIAGNOSIS — I69354 Hemiplegia and hemiparesis following cerebral infarction affecting left non-dominant side: Secondary | ICD-10-CM | POA: Diagnosis not present

## 2015-03-17 DIAGNOSIS — J440 Chronic obstructive pulmonary disease with acute lower respiratory infection: Secondary | ICD-10-CM | POA: Diagnosis not present

## 2015-03-17 DIAGNOSIS — J189 Pneumonia, unspecified organism: Secondary | ICD-10-CM | POA: Diagnosis not present

## 2015-03-17 DIAGNOSIS — I482 Chronic atrial fibrillation: Secondary | ICD-10-CM | POA: Diagnosis not present

## 2015-03-17 DIAGNOSIS — I10 Essential (primary) hypertension: Secondary | ICD-10-CM | POA: Diagnosis not present

## 2015-03-20 ENCOUNTER — Other Ambulatory Visit: Payer: Self-pay | Admitting: *Deleted

## 2015-03-20 ENCOUNTER — Encounter: Payer: Self-pay | Admitting: *Deleted

## 2015-03-20 NOTE — Patient Outreach (Signed)
[facility]) Care Management   03/20/2015  Jeffrey Sweeney Feb 04, 1945 [id number]  Jeffrey Sweeney is an 70 y.o. male  Subjective:  Patient reporting he "jammed my thumb on the door" and it is "smarting" Patient still getting help from his sister for meals and housekeeping.  Patient still has [place] PT working with him on safe ambulation and increase strength.  Patient states daughter could not assist with medication box this week due to her work schedule.  Objective:   Patient using walker to ambulate. Patient left thumb with swelling. Patient medication box not filled correctly per daughter's list  BP 112/64 mmHg  Pulse 76  Resp 20  Wt 182 lb (82.555 kg)  SpO2 97% Review of Systems  Constitutional: Negative.   Eyes: Negative.   Respiratory: Negative.   Cardiovascular: Negative.   Genitourinary: Negative.   Musculoskeletal: Positive for joint pain.  Skin: Negative.   Endo/Heme/Allergies: Bruises/bleeds easily.  Psychiatric/Behavioral: Positive for depression.       Patient states he is "depressed over not being able to grip with his left thumb"    Physical Exam  Constitutional: He is oriented to person, place, and time. He appears well-developed.  Cardiovascular: Normal rate.   Respiratory: Effort normal and breath sounds normal.  GI: Soft. Bowel sounds are normal.  Neurological: He is alert and oriented to person, place, and time.  Skin: Skin is warm and dry.    Current Medications:   Current Outpatient Prescriptions  Medication Sig Dispense Refill  . albuterol (PROVENTIL HFA) 108 (90 BASE) MCG/ACT inhaler Inhale 2 puffs into the lungs every 6 (six) hours as needed for wheezing or shortness of breath.    Marland Kitchen albuterol (PROVENTIL) (2.5 MG/3ML) 0.083% nebulizer solution Take 3 mLs (2.5 mg total) by nebulization every 2 (two) hours as needed for wheezing or shortness of breath. 75 mL 12  . alendronate (FOSAMAX) 10 MG tablet Take 4 tablets (40 mg total) by  mouth every 7 (seven) days. Take with a full glass of water on an empty stomach.    Marland Kitchen amLODipine (NORVASC) 5 MG tablet Take 5 mg by mouth daily.    Marland Kitchen aspirin EC 81 MG tablet Take 1 tablet (81 mg total) by mouth daily. 30 tablet 2  . atorvastatin (LIPITOR) 40 MG tablet Take 40 mg by mouth at bedtime.     . Cholecalciferol (VITAMIN D) 2000 units CAPS Take 1 capsule by mouth daily.    . fluticasone furoate-vilanterol (BREO ELLIPTA) 100-25 MCG/INH AEPB Inhale 1 puff into the lungs daily.    Marland Kitchen levETIRAcetam (KEPPRA) 1000 MG tablet Take 1,000 mg by mouth 3 (three) times daily.    . midodrine (PROAMATINE) 5 MG tablet Take 1 tablet (5 mg total) by mouth 3 (three) times daily with meals.    . nitroGLYCERIN (NITROSTAT) 0.4 MG SL tablet Place 0.4 mg under the tongue every 5 (five) minutes as needed. Chest pain    . pantoprazole (PROTONIX) 40 MG tablet Take 40 mg by mouth daily.      . potassium chloride SA (K-DUR,KLOR-CON) 20 MEQ tablet Take 20 mEq by mouth 3 (three) times daily.     Marland Kitchen tiotropium (SPIRIVA) 18 MCG inhalation capsule Place 1 capsule (18 mcg total) into inhaler and inhale daily. 30 capsule 12  . Valproic Acid (DEPAKENE) 250 MG/5ML SYRP syrup Take 250 mg by mouth 2 (two) times daily.     Marland Kitchen ALPRAZolam (XANAX) 1 MG tablet Take 1 tablet (1 mg total) by  mouth 3 (three) times daily as needed. For anxiety (Patient not taking: Reported on 03/20/2015) 30 tablet 0  . budesonide-formoterol (SYMBICORT) 160-4.5 MCG/ACT inhaler Inhale 2 puffs into the lungs 2 (two) times daily. (Patient not taking: Reported on 03/01/2015) 1 Inhaler 12  . levofloxacin (LEVAQUIN) 750 MG tablet Take 1 tablet (750 mg total) by mouth daily. (Patient not taking: Reported on 03/01/2015) 10 tablet 0  . oxyCODONE (OXY IR/ROXICODONE) 5 MG immediate release tablet Take 1 tablet (5 mg total) by mouth every 4 (four) hours as needed for moderate pain. (Patient not taking: Reported on 03/01/2015) 30 tablet 0  . predniSONE (DELTASONE) 20 MG  tablet Take 2 tablets (40 mg total) by mouth daily with breakfast. (Patient not taking: Reported on 03/01/2015)     No current facility-administered medications for this visit.    Functional Status:   In your present state of health, do you have any difficulty performing the following activities: 03/01/2015 02/22/2015  Hearing? N -  Vision? N -  Difficulty concentrating or making decisions? Y -  Walking or climbing stairs? Y -  Dressing or bathing? Y -  Doing errands, shopping? Y -  Conservation officer, nature and eating ? N -  Using the Toilet? N -  In the past six months, have you accidently leaked urine? N -  Do you have problems with loss of bowel control? N -  Managing your Medications? Y Y  Managing your Finances? [name]  Housekeeping or managing your Housekeeping? Y Y    Fall/Depression Screening:    Fall Risk  03/01/2015 02/06/2015 02/06/2015 01/16/2015  Falls in the past year? Yes Yes Yes Yes  Number falls in past yr: - 2 or more 2 or more 2 or more  Injury with Fall? Yes Yes Yes Yes  Risk Factor Category  - High Fall Risk High Fall Risk High Fall Risk  Risk for fall due to : History of fall(s);Impaired balance/gait History of fall(s);Impaired balance/gait;Impaired mobility History of fall(s);Impaired balance/gait;Impaired mobility History of fall(s);Impaired balance/gait;Impaired mobility  Follow up Falls prevention discussed Falls prevention discussed Falls prevention discussed Falls prevention discussed   PHQ 2/9 Scores 03/20/2015 03/01/2015 02/06/2015 02/06/2015 01/16/2015  PHQ - 2 Score 0 0 3 3 3   PHQ- 9 Score - - 8 8 8     Assessment:   COPD: patient denies COPD diagnosis, uses medications Medication management: patient having to fill medication box as daughter not available this week to fill  Plan:  [facility] CM Care Plan Problem One        Most Recent Value   Care Plan Problem One  high risk for readmission as evidenced by recent hospitaliation and Skilled nursing stay   Role Documenting the  Problem One  Care Management [place] for Problem One  Active   THN Long Term Goal (31-90 days)  Patient will be able toself manage at honme with assitance of family over the next 60 days   Interventions for Problem One Long Term Goal  Reviewed support system, family still assisting but patient able to stay overnight alone, assisted patient with medication box today and re-wrote medication list for patient to use for medication management   THN CM Short Term Goal #1 (0-30 days)  Self management needs as evidenced by recent skilled nursing stay   [facility] CM Short Term Goal #1 Start Date  02/22/15   Interventions for Short Term Goal #1  Praised patient for working hard to remain at home  Patient will call MD if thumb pain gets worse, patient will try ice and hand exercises to assist with soreness Patient will call RNCM with questions or concerns RNCM will visit again next month Mary E. Laymond Purser, RN, BSN, [place] [facility] Liaison [phone]

## 2015-03-21 DIAGNOSIS — J189 Pneumonia, unspecified organism: Secondary | ICD-10-CM | POA: Diagnosis not present

## 2015-03-21 DIAGNOSIS — I482 Chronic atrial fibrillation: Secondary | ICD-10-CM | POA: Diagnosis not present

## 2015-03-21 DIAGNOSIS — J440 Chronic obstructive pulmonary disease with acute lower respiratory infection: Secondary | ICD-10-CM | POA: Diagnosis not present

## 2015-03-21 DIAGNOSIS — I10 Essential (primary) hypertension: Secondary | ICD-10-CM | POA: Diagnosis not present

## 2015-03-21 DIAGNOSIS — I69354 Hemiplegia and hemiparesis following cerebral infarction affecting left non-dominant side: Secondary | ICD-10-CM | POA: Diagnosis not present

## 2015-03-27 ENCOUNTER — Other Ambulatory Visit: Payer: Self-pay | Admitting: Licensed Clinical Social Worker

## 2015-03-27 NOTE — Patient Outreach (Signed)
Assessment:  [name] called client on 03/27/15 and spoke via phone with client. [name] verified client identity. [name] and client spoke of client needs.  Client said he has his prescribed medications and is taking his medications as prescribed. He said he is eating adequately and sleeping adequately. Client had been receiving physical therapy sessions, as scheduled, in the home environment.Client said that his home health physical therapy sessions were completed now for client. Client said he thinks home health nursing will continue to assist him in the home for a while.  Client has family support.  He has family members helping transport him to and from scheduled client medical appointments. Family members are helping client obtain needed food items; family members are preparing meals as needed for client. [name] and client spoke of client care plan. .Client said he will try to attend scheduled client medical appointments in the next 30 days.  [name] encouraged client to attend scheduled client medical appointments in the next 30 days.  Client said he is feeling better now.  He did not mention any pain issues.  He said he uses a walker to help him ambulate. He has not had any recent falls. He is trying to prepare light meals as needed for himself. He said he has been able to pay his bills as needed. [name] encouraged client to call [name] at [phone] to discuss social work needs of client. [name] thanked client for phone conversation with [name] on 03/27/15.    Plan: Client will attend all scheduled client medical appointments in next 30 days. [name] to collaborate with RN Burgess Amor, as needed, in monitoring needs of client. [name] to call client in 4 weeks to assess client needs.  [name].Rue Valladares MSW, LCSW Licensed Clinical Social Worker [facility] Care Management [phone]

## 2015-04-17 ENCOUNTER — Other Ambulatory Visit: Payer: Self-pay | Admitting: *Deleted

## 2015-04-17 ENCOUNTER — Encounter: Payer: Self-pay | Admitting: *Deleted
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
## 2015-04-25 ENCOUNTER — Other Ambulatory Visit: Payer: Self-pay | Admitting: *Deleted

## 2015-04-25 NOTE — Patient Outreach (Signed)
Call to patient to follow up on nurse line call. No answer, left VM requesting call back. Will attempt to call again later.  Jeffrey Sweeney. Laymond Purser, RN, BSN, [place] [phone]

## 2015-04-25 NOTE — Patient Outreach (Signed)
Message received to call patient to follow up on Nurse line call. Call to patient, no answer, left VM with RNCM contact information requesting call back. Will attempt to contact again later, if no call received. Royetta Crochet. Laymond Purser, RN, BSN, [place] [phone]

## 2015-04-26 ENCOUNTER — Other Ambulatory Visit: Payer: Self-pay | Admitting: Licensed Clinical Social Worker
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
## 2015-05-01 ENCOUNTER — Other Ambulatory Visit: Payer: Self-pay | Admitting: *Deleted

## 2015-05-01 VITALS — BP 108/60 | HR 100 | Resp 20

## 2015-05-01 DIAGNOSIS — Z79899 Other long term (current) drug therapy: Secondary | ICD-10-CM
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
## 2015-05-02 ENCOUNTER — Other Ambulatory Visit: Payer: Self-pay | Admitting: Licensed Clinical Social Worker

## 2015-05-02 ENCOUNTER — Other Ambulatory Visit: Payer: Self-pay | Admitting: *Deleted

## 2015-05-02 NOTE — Patient Outreach (Signed)
Jeffrey Sweeney) Care Management  [facility] Social Work  05/02/2015  Jeffrey Sweeney 22-Nov-1945 [id number]  Subjective:    Objective:   Encounter Medications:  Outpatient Encounter Prescriptions as of 05/02/2015  Medication Sig Note  . albuterol (PROVENTIL HFA) 108 (90 BASE) MCG/ACT inhaler Inhale 2 puffs into the lungs every 6 (six) hours as needed for wheezing or shortness of breath.   Marland Kitchen albuterol (PROVENTIL) (2.5 MG/3ML) 0.083% nebulizer solution Take 3 mLs (2.5 mg total) by nebulization every 2 (two) hours as needed for wheezing or shortness of breath. (Patient not taking: Reported on 05/01/2015)   . alendronate (FOSAMAX) 10 MG tablet Take 4 tablets (40 mg total) by mouth every 7 (seven) days. Take with a full glass of water on an empty stomach. (Patient not taking: Reported on 04/17/2015)   . ALPRAZolam (XANAX) 1 MG tablet Take 1 tablet (1 mg total) by mouth 3 (three) times daily as needed. For anxiety (Patient not taking: Reported on 04/17/2015) 04/17/2015: Patient reports this medication was stolen  . amLODipine (NORVASC) 5 MG tablet Take 5 mg by mouth daily.   Marland Kitchen aspirin EC 81 MG tablet Take 1 tablet (81 mg total) by mouth daily.   Marland Kitchen atorvastatin (LIPITOR) 40 MG tablet Take 40 mg by mouth at bedtime.    . budesonide-formoterol (SYMBICORT) 160-4.5 MCG/ACT inhaler Inhale 2 puffs into the lungs 2 (two) times daily. (Patient not taking: Reported on 03/01/2015)   . Cholecalciferol (VITAMIN D) 2000 units CAPS Take 1 capsule by mouth daily. Reported on 05/01/2015   . fluticasone furoate-vilanterol (BREO ELLIPTA) 100-25 MCG/INH AEPB Inhale 1 puff into the lungs daily. Reported on 05/01/2015   . levETIRAcetam (KEPPRA) 1000 MG tablet Take 1,000 mg by mouth 3 (three) times daily.   Marland Kitchen levofloxacin (LEVAQUIN) 750 MG tablet Take 1 tablet (750 mg total) by mouth daily. (Patient not taking: Reported on 03/01/2015)   . midodrine (PROAMATINE) 5 MG tablet Take 1 tablet (5 mg total) by mouth 3 (three)  times daily with meals. 03/01/2015: No bottle in the house Daughter is checking with MD office regarding this medication  . nitroGLYCERIN (NITROSTAT) 0.4 MG SL tablet Place 0.4 mg under the tongue every 5 (five) minutes as needed. Reported on 05/01/2015   . oxyCODONE (OXY IR/ROXICODONE) 5 MG immediate release tablet Take 1 tablet (5 mg total) by mouth every 4 (four) hours as needed for moderate pain. (Patient not taking: Reported on 03/01/2015) 04/17/2015: Patient reports medication was stolen  . pantoprazole (PROTONIX) 40 MG tablet Take 40 mg by mouth daily.     . potassium chloride SA (K-DUR,KLOR-CON) 20 MEQ tablet Take 20 mEq by mouth 3 (three) times daily.    . predniSONE (DELTASONE) 20 MG tablet Take 2 tablets (40 mg total) by mouth daily with breakfast. (Patient not taking: Reported on 03/01/2015)   . tiotropium (SPIRIVA) 18 MCG inhalation capsule Place 1 capsule (18 mcg total) into inhaler and inhale daily.   . Valproic Acid (DEPAKENE) 250 MG/5ML SYRP syrup Take 250 mg by mouth 2 (two) times daily.  04/17/2015: Pill not syrup   No facility-administered encounter medications on file as of 05/02/2015.    Functional Status:  In your present state of health, do you have any difficulty performing the following activities: 03/01/2015 02/22/2015  Hearing? N -  Vision? N -  Difficulty concentrating or making decisions? Y -  Walking or climbing stairs? Y -  Dressing or bathing? Y -  Doing errands, shopping? Y -  Preparing Food and eating ? N -  Using the Toilet? N -  In the past six months, have you accidently leaked urine? N -  Do you have problems with loss of bowel control? N -  Managing your Medications? Y Y  Managing your Finances? Jeffrey Sweeney  Housekeeping or managing your Housekeeping? Jeffrey Sweeney    Fall/Depression Screening:  PHQ 2/9 Scores 04/26/2015 03/20/2015 03/01/2015 02/06/2015 02/06/2015 01/16/2015  PHQ - 2 Score 2 0 0 3 3 3   PHQ- 9 Score 7 - - 8 8 8     Assessment:   [name] traveled to home of client  on 05/02/15.  [name] and client had scheduled for [name] to meet with client at home of client on 05/02/15 at 10:15 AM.  Client had previously agreed to this appointment with [name] on 05/02/15. [name] knocked on front and back doors at client home on 05/02/15  but client did not come to the door. [name] called client phone number numerous times on 05/02/15 but client did not answer phone.  [name] called Crystal, daughter of client; Jeffrey Sweeney was not aware of any emergency with client and was not aware of any medical appointment of client on 05/02/15.  [name] called Burgess Amor RN on 05/02/15. She said she called Jeffrey Sweeney, sister of client on 05/02/15. Jeffrey Sweeney informed RN that client did not want to meet with [name] for appointment on 05/02/15. Jeffrey Sweeney encouraged client to call [name] to cancel appointment for 05/02/15. Client did not notify [name] or did not communicate in any way with [name] regarding cancelling appointment. [name] drove over one hour one way to complete this appointment scheduled with client. While [name] was at home of client trying to contact client, local pharmacy tried to deliver client medications and pharmacy delivery service was not able to deliver client medications.  [name] discussed this situation with RN Burgess Amor on 05/02/15.   Plan:   Client to attend all scheduled client medical appointments in next 30 days. [name] to call client in 4 weeks to assess needs of client at that time. [name] to collaborate with RN Burgess Amor as needed to monitor needs of client.   Jeffrey Sweeney.Jeffrey Sweeney MSW, LCSW Licensed Clinical Social Worker [facility] Care Management [phone]

## 2015-05-02 NOTE — Patient Outreach (Signed)
Call to [facility] LCSW and gave him Betty's number to call her and talk to her regarding patient. I told him that patient was supposed to cancel appointment per sister. Plan to follow up with patient when patient available. Royetta Crochet. Laymond Purser, RN, BSN, [place] [phone]

## 2015-05-02 NOTE — Patient Outreach (Signed)
Call to patient sister, [name], she states that patient had planned to call SW to cancel today's appointment, as he was going to sleep due to the rain and did not want to talk today. She states she spent yesterday afternoon with patient and that she told him to be sure to call and cancel the appointment this morning.  She apologizes that he did not call. She reports she will check on patient later. Royetta Crochet. Laymond Purser, RN, BSN, [place] [phone]

## 2015-05-02 NOTE — Patient Outreach (Signed)
Call to patient, no answer, left message requesting call back. Royetta Crochet. Laymond Purser, RN, BSN, [place] [phone]

## 2015-05-02 NOTE — Patient Outreach (Signed)
VM rec'd from [place], he had appointment with patient, and cannot get him to door. Requesting call back.  Call back to [facility] LCSW, Scott Forrest, unable to reach.  Royetta Crochet. Laymond Purser, RN, BSN, [place] [phone]

## 2015-05-08 ENCOUNTER — Other Ambulatory Visit: Payer: Self-pay | Admitting: *Deleted

## 2015-05-08 ENCOUNTER — Encounter: Payer: Self-pay | Admitting: *Deleted

## 2015-05-08 NOTE — Patient Outreach (Addendum)
[place] [facility]) Care Management   05/08/2015  Jeffrey Sweeney 22-Jan-1945 [id number]  Jeffrey Sweeney is an 70 y.o. male  Subjective:  Patient states he is staying with the "bottles for medications", decided to not keep using the rx care bubble packs,  Stating it was "too confusing" Patient reports sister assisted with medbox filling and will assist patient. Patient agrees to let [facility] Pharmacist review medication management  Appointment with Dr. Luan Pulling May 17 Patient will f/u on driving, foot MD appointment and eye MD appointment  Objective:   BP 120/60 mmHg  Pulse 95  Resp 20  SpO2 96% Review of Systems  Constitutional: Negative.   Eyes: Positive for blurred vision.       States he has cataracts  Respiratory: Negative.   Cardiovascular: Negative.   Genitourinary: Negative.   Musculoskeletal: Negative.   Neurological:       Left sided weakness from stroke  Psychiatric/Behavioral: The patient is nervous/anxious.     Physical Exam  Constitutional: He appears well-developed and well-nourished.  Cardiovascular: Normal rate.   Respiratory: Breath sounds normal.  Musculoskeletal: Normal range of motion.  Neurological: He is alert.  Skin: Skin is warm.    Encounter Medications:   Outpatient Encounter Prescriptions as of 05/08/2015  Medication Sig Note  . albuterol (PROVENTIL HFA) 108 (90 BASE) MCG/ACT inhaler Inhale 2 puffs into the lungs every 6 (six) hours as needed for wheezing or shortness of breath.   Marland Kitchen albuterol (PROVENTIL) (2.5 MG/3ML) 0.083% nebulizer solution Take 3 mLs (2.5 mg total) by nebulization every 2 (two) hours as needed for wheezing or shortness of breath. (Patient not taking: Reported on 05/01/2015)   . alendronate (FOSAMAX) 10 MG tablet Take 4 tablets (40 mg total) by mouth every 7 (seven) days. Take with a full glass of water on an empty stomach. (Patient not taking: Reported on 04/17/2015)   . ALPRAZolam (XANAX) 1 MG tablet Take 1 tablet (1  mg total) by mouth 3 (three) times daily as needed. For anxiety (Patient not taking: Reported on 04/17/2015) 04/17/2015: Patient reports this medication was stolen  . amLODipine (NORVASC) 5 MG tablet Take 5 mg by mouth daily.   Marland Kitchen aspirin EC 81 MG tablet Take 1 tablet (81 mg total) by mouth daily.   Marland Kitchen atorvastatin (LIPITOR) 40 MG tablet Take 40 mg by mouth at bedtime.    . budesonide-formoterol (SYMBICORT) 160-4.5 MCG/ACT inhaler Inhale 2 puffs into the lungs 2 (two) times daily. (Patient not taking: Reported on 03/01/2015)   . Cholecalciferol (VITAMIN D) 2000 units CAPS Take 1 capsule by mouth daily. Reported on 05/01/2015   . fluticasone furoate-vilanterol (BREO ELLIPTA) 100-25 MCG/INH AEPB Inhale 1 puff into the lungs daily. Reported on 05/01/2015   . levETIRAcetam (KEPPRA) 1000 MG tablet Take 1,000 mg by mouth 3 (three) times daily.   Marland Kitchen levofloxacin (LEVAQUIN) 750 MG tablet Take 1 tablet (750 mg total) by mouth daily. (Patient not taking: Reported on 03/01/2015)   . midodrine (PROAMATINE) 5 MG tablet Take 1 tablet (5 mg total) by mouth 3 (three) times daily with meals. 03/01/2015: No bottle in the house Daughter is checking with MD office regarding this medication  . nitroGLYCERIN (NITROSTAT) 0.4 MG SL tablet Place 0.4 mg under the tongue every 5 (five) minutes as needed. Reported on 05/01/2015   . oxyCODONE (OXY IR/ROXICODONE) 5 MG immediate release tablet Take 1 tablet (5 mg total) by mouth every 4 (four) hours as needed for moderate pain. (Patient not taking:  Reported on 03/01/2015) 04/17/2015: Patient reports medication was stolen  . pantoprazole (PROTONIX) 40 MG tablet Take 40 mg by mouth daily.     . potassium chloride SA (K-DUR,KLOR-CON) 20 MEQ tablet Take 20 mEq by mouth 3 (three) times daily.    . predniSONE (DELTASONE) 20 MG tablet Take 2 tablets (40 mg total) by mouth daily with breakfast. (Patient not taking: Reported on 03/01/2015)   . tiotropium (SPIRIVA) 18 MCG inhalation capsule Place 1  capsule (18 mcg total) into inhaler and inhale daily.   . Valproic Acid (DEPAKENE) 250 MG/5ML SYRP syrup Take 250 mg by mouth 2 (two) times daily.  04/17/2015: Pill not syrup   No facility-administered encounter medications on file as of 05/08/2015.    Functional Status:   In your present state of health, do you have any difficulty performing the following activities: 03/01/2015 02/22/2015  Hearing? N -  Vision? N -  Difficulty concentrating or making decisions? Y -  Walking or climbing stairs? Y -  Dressing or bathing? Y -  Doing errands, shopping? Y -  Conservation officer, nature and eating ? N -  Using the Toilet? N -  In the past six months, have you accidently leaked urine? N -  Do you have problems with loss of bowel control? N -  Managing your Medications? Y Y  Managing your Finances? [name]  Housekeeping or managing your Housekeeping? [name]    Fall/Depression Screening:    Fall Risk  04/26/2015 03/01/2015 02/06/2015 02/06/2015 01/16/2015  Falls in the past year? Yes Yes Yes Yes Yes  Number falls in past yr: 2 or more - 2 or more 2 or more 2 or more  Injury with Fall? Yes Yes Yes Yes Yes  Risk Factor Category  High Fall Risk - High Fall Risk High Fall Risk High Fall Risk  Risk for fall due to : History of fall(s);Impaired balance/gait History of fall(s);Impaired balance/gait History of fall(s);Impaired balance/gait;Impaired mobility History of fall(s);Impaired balance/gait;Impaired mobility History of fall(s);Impaired balance/gait;Impaired mobility  Follow up Falls prevention discussed Falls prevention discussed Falls prevention discussed Falls prevention discussed Falls prevention discussed   PHQ 2/9 Scores 04/26/2015 03/20/2015 03/01/2015 02/06/2015 02/06/2015 01/16/2015  PHQ - 2 Score 2 0 0 3 3 3   PHQ- 9 Score 7 - - 8 8 8     Assessment:   COPD: needs to use COPD medication daily  Level of Care issues: Patient wants to remain at home and not seek placement at this time. Medication management: Patient  sister filled medbox, Gave medbox. Reminded of referral to [place]:  [place] Problem One        Most Recent Value   Care Plan Problem One  high risk for readmission as evidenced by recent hospitaliation and Skilled nursing stay   Role Documenting the Problem One  Care Management [place] for Problem One  Active   [facility] Long Term Goal (31-90 days)  Patient will be able toself manage at honme with assitance of family over the next 60 days   [facility] Long Term Goal Start Date  05/01/15   Interventions for Problem One Long Term Goal  using teachback, reviewed with patient his goal of remaining at currentl level of care, reviewed safety, encouraged patient to allow family to continue to assist     [facility] CM Care Plan Problem Two        Most Recent Value   Care Plan Problem Two  COPD  knowledge deficit as evidenced by patient verbalizing questions around disease   Role Documenting the Problem Two  Care Management [place] for Problem Two  Active   Interventions for Problem Two Long Term Goal   reviewed COPD action plan, reviewed importance of using inhalers,   THN Long Term Goal (31-90) days  Patient will be familiar with the COPD zones over the next 31 days   [facility] Long Term Goal Start Date  04/17/15     Plan to visit again in June. Patient will call RNCM with questions or concerns Patient will speak with MD about foot MD appointment  Royetta Crochet. Laymond Purser, RN, BSN, [place] [phone]

## 2015-05-24 DIAGNOSIS — I951 Orthostatic hypotension: Secondary | ICD-10-CM | POA: Diagnosis not present

## 2015-05-24 DIAGNOSIS — R569 Unspecified convulsions: Secondary | ICD-10-CM | POA: Diagnosis not present

## 2015-05-24 DIAGNOSIS — I130 Hypertensive heart and chronic kidney disease with heart failure and stage 1 through stage 4 chronic kidney disease, or unspecified chronic kidney disease: Secondary | ICD-10-CM | POA: Diagnosis not present

## 2015-05-24 DIAGNOSIS — I10 Essential (primary) hypertension: Secondary | ICD-10-CM | POA: Diagnosis not present

## 2015-05-25 ENCOUNTER — Other Ambulatory Visit: Payer: Self-pay | Admitting: Licensed Clinical Social Worker

## 2015-05-25 NOTE — Patient Outreach (Signed)
Assessment:  [name] called home phone number for client on 05/25/15.  [name] spoke via phone with client on 05/25/15. [name] verified identity of client. [name] and client spoke of client needs. Client said he had appointment with Dr. Luan Pulling, his primary care doctor yesterday.  Client said he has completed in home physical therapy support at present. Client said he is working with RN Burgess Amor to address nursing needs of client.  Client's sister, [name], transports client to and from client's scheduled medical appointments.  He is eating well and sleeping well. Client said he ambulates with use of a walker.  He did not speak of any pain issues.  [name] and client spoke of client care plan. [name] encouraged client to attend all scheduled client medical appointments in next 30 days. Client and [name] spoke of bill client owes to skilled nursing facility. [name] encouraged client to call skilled nursing facility to see if he could make payment plan arrangements for money owed to facility.  Client said he would call skilled nursing facility and talk with representative of facility about setting up payment plan to facility for client. [name] thanked client for phone call with [name] on 05/25/15.    Plan:  Client to attend all scheduled client medical appointments in next 30 days. [name] to call client in 4 weeks to assess needs of client.  [name].Alyson Ki MSW, LCSW Licensed Clinical Social Worker [facility] Care Management [phone]

## 2015-05-30 ENCOUNTER — Other Ambulatory Visit: Payer: Self-pay | Admitting: Pharmacist

## 2015-05-30 NOTE — Patient Outreach (Signed)
[facility]) Care Management  05/30/2015  Jeffrey Sweeney 02-12-1945 [id number]  Patient was referred to [place] by Stanton Kidney [place].  Pharmacist placed call to patient and patient verified name and date of birth.  Pharmacist explained purpose of call.  Patient had previously obtained medications from [facility] in [place] with blister packing, but did not like this method per Stanton Kidney, [facility] RN.    Asked patient if it would be okay for pharmacist to complete a home visit with patient at [facility] next scheduled [facility] visit and patient was okay with this.   Plan:  Stanton Kidney has a home visit scheduled with patient for 06/12/15---will plan to complete a co-visit with Stanton Kidney at that time.     Karrie Meres, PharmD, [place] [phone]

## 2015-06-01 ENCOUNTER — Encounter: Payer: Self-pay | Admitting: Licensed Clinical Social Worker

## 2015-06-01 ENCOUNTER — Other Ambulatory Visit: Payer: Self-pay | Admitting: Licensed Clinical Social Worker

## 2015-06-01 NOTE — Patient Outreach (Signed)
Assessment:  [name] spoke via phone with client on 06/01/15. [name] verified client identity. [name] and client spoke of client needs. Client said he had his prescribed medications and is taking medications as prescribed. He said he has support from his sister, [name], in transporting him to and from scheduled medical appointments. He said he is eating well and sleeping well. Marland Kitchen He continues to receive [facility] nursing support with RN Burgess Amor.  He desires to continue living at his home. He has completed all in home physical therapy sessions for client. [name] informed [place] on 06/01/15 that client had met his care plan goals with [name] services. Thus, [name] informed [place] on 06/01/15 that [facility] would discharge client from [facility] [place] only on 06/01/15. Client agreed to this plan. Client will continue to receive [facility] nursing support with RN Burgess Amor.  [name] congratulated client on reaching his care plan goals with [name] services.  Plan:  [name] is discharging [employer]. [place] from [place] services only on 06/01/15 since client has met care plan goals with [name] services. [name] to inform Josepha Pigg that [facility] discharged client from [place] services only on 06/01/15. [name] to fax letter to Dr. Luan Pulling on 06/01/15 informing Dr. Luan Pulling that [facility] discharged client on 06/01/15 from [place] services only.  [name].Lorree Millar MSW, LCSW Licensed Clinical Social Worker [facility] Care Management [phone]

## 2015-06-12 ENCOUNTER — Other Ambulatory Visit: Payer: Self-pay | Admitting: *Deleted

## 2015-06-12 ENCOUNTER — Ambulatory Visit: Payer: Self-pay | Admitting: Pharmacist

## 2015-06-12 ENCOUNTER — Other Ambulatory Visit: Payer: Self-pay | Admitting: Pharmacist

## 2015-06-12 NOTE — Patient Outreach (Signed)
[facility]) Care Management  06/12/2015  Jeffrey Sweeney 12-19-1945 [id number]   Call from case management assistant, Mickel Baas, patient called nurse line to cancel today's scheduled appointment. Call to patient to reschedule. No answer, left message requesting call back Plan to reschedule if patient still would like to reschedule. [facility] pharmacist made aware of cancellation. Royetta Crochet. Laymond Purser, RN, BSN, [place] [phone]

## 2015-06-12 NOTE — Patient Outreach (Signed)
[facility]) Care Management  06/12/2015  TAL MCCREEDY 03-17-1945 [id number]  Initial pharmacy home visit was scheduled for today to be a co-visit with Merrily Brittle, [place].  Patient cancelled this joint home visit appointment.   Plan:  Will work with Stanton Kidney to attempt to reschedule home visit.   Karrie Meres, PharmD, [place] [phone]

## 2015-06-21 ENCOUNTER — Other Ambulatory Visit: Payer: Self-pay | Admitting: *Deleted

## 2015-06-21 NOTE — Patient Outreach (Signed)
Call to patient to re-schedule appointment No answer, left voicemail. Plan will await call back, if no call back will follow up next week. Royetta Crochet. Laymond Purser, RN, BSN, [place] [phone]

## 2015-06-22 ENCOUNTER — Other Ambulatory Visit: Payer: Self-pay | Admitting: Pharmacist

## 2015-06-22 NOTE — Patient Outreach (Signed)
Attempted to reach patient via phone to reschedule home visit patient cancelled on 06/12/15.  Patient did not answer.  HIPAA compliant message left for patient.   [place], Stanton Kidney has also been unsuccessful reaching patient.   Plan:  Will attempt to reach patient again next week if no return call.   Karrie Meres, PharmD, [place] [phone]

## 2015-06-26 ENCOUNTER — Encounter: Payer: Self-pay | Admitting: *Deleted

## 2015-06-26 ENCOUNTER — Ambulatory Visit: Payer: Self-pay | Admitting: Licensed Clinical Social Worker

## 2015-06-26 ENCOUNTER — Other Ambulatory Visit: Payer: Self-pay | Admitting: Pharmacist

## 2015-06-26 ENCOUNTER — Other Ambulatory Visit: Payer: Self-pay | Admitting: *Deleted

## 2015-06-26 NOTE — Patient Outreach (Signed)
Call to patient No answer, left VM, 3rd attempt with no return call. Plan to send letter and if no return contact from patient will close case. Royetta Crochet. Laymond Purser, RN, BSN, [place] [phone]

## 2015-06-26 NOTE — Patient Outreach (Signed)
Unsuccessful attempt to reach patient following patient cancelling home visit on 06/12/15.    Left HIPAA compliant message requesting a return call.    [place], has also been unsuccessful reaching patient and sent unsuccessful outreach letter.    If patient does not return call or reply to outreach letter from [facility], will plan to close case.    Karrie Meres, PharmD, [place] [phone]

## 2015-07-14 ENCOUNTER — Encounter: Payer: Self-pay | Admitting: *Deleted

## 2015-07-14 NOTE — Patient Outreach (Signed)
No response from unable to reach letter. Case closed per protocol. Royetta Crochet. Laymond Purser, RN, BSN, [place] [phone]

## 2015-07-18 ENCOUNTER — Other Ambulatory Visit: Payer: Self-pay | Admitting: Pharmacist

## 2015-07-18 NOTE — Patient Outreach (Signed)
[facility]) Care Management  07/18/2015  MIRAC MCTIERNAN 1945/12/13 [id number]  [facility] has been unable to maintain contact with patient despite multiple phone calls from pharmacy and [place].  Patient did not respond to outreach letter sent by [place] Coordinator and [facility] RN Community has closed patient case.   Will close pharmacy case at this time due to no patient response to phone calls or outreach letter.

## 2015-08-24 DIAGNOSIS — J449 Chronic obstructive pulmonary disease, unspecified: Secondary | ICD-10-CM | POA: Diagnosis not present

## 2015-08-24 DIAGNOSIS — Z125 Encounter for screening for malignant neoplasm of prostate: Secondary | ICD-10-CM | POA: Diagnosis not present

## 2015-08-24 DIAGNOSIS — I951 Orthostatic hypotension: Secondary | ICD-10-CM | POA: Diagnosis not present

## 2015-08-24 DIAGNOSIS — R569 Unspecified convulsions: Secondary | ICD-10-CM | POA: Diagnosis not present

## 2015-08-24 DIAGNOSIS — I10 Essential (primary) hypertension: Secondary | ICD-10-CM | POA: Diagnosis not present

## 2016-01-03 ENCOUNTER — Emergency Department (HOSPITAL_COMMUNITY): Payer: Commercial Managed Care - HMO

## 2016-01-03 ENCOUNTER — Encounter (HOSPITAL_COMMUNITY): Payer: Self-pay | Admitting: *Deleted

## 2016-01-03 ENCOUNTER — Inpatient Hospital Stay (HOSPITAL_COMMUNITY)
Admission: EM | Admit: 2016-01-03 | Discharge: 2016-01-10 | DRG: 100 | Disposition: A | Discharge status: Skilled Nursing Facility | Payer: Commercial Managed Care - HMO | Attending: Pulmonary Disease | Admitting: Pulmonary Disease

## 2016-01-03 DIAGNOSIS — I69959 Hemiplegia and hemiparesis following unspecified cerebrovascular disease affecting unspecified side: Secondary | ICD-10-CM

## 2016-01-03 DIAGNOSIS — G9349 Other encephalopathy: Secondary | ICD-10-CM | POA: Diagnosis present

## 2016-01-03 DIAGNOSIS — I4891 Unspecified atrial fibrillation: Secondary | ICD-10-CM | POA: Diagnosis present

## 2016-01-03 DIAGNOSIS — K219 Gastro-esophageal reflux disease without esophagitis: Secondary | ICD-10-CM | POA: Diagnosis present

## 2016-01-03 DIAGNOSIS — I639 Cerebral infarction, unspecified: Secondary | ICD-10-CM | POA: Diagnosis not present

## 2016-01-03 DIAGNOSIS — I2510 Atherosclerotic heart disease of native coronary artery without angina pectoris: Secondary | ICD-10-CM | POA: Diagnosis not present

## 2016-01-03 DIAGNOSIS — I6789 Other cerebrovascular disease: Secondary | ICD-10-CM | POA: Diagnosis not present

## 2016-01-03 DIAGNOSIS — Z23 Encounter for immunization: Secondary | ICD-10-CM | POA: Diagnosis not present

## 2016-01-03 DIAGNOSIS — S299XXA Unspecified injury of thorax, initial encounter: Secondary | ICD-10-CM | POA: Diagnosis not present

## 2016-01-03 DIAGNOSIS — E7800 Pure hypercholesterolemia, unspecified: Secondary | ICD-10-CM | POA: Diagnosis not present

## 2016-01-03 DIAGNOSIS — J449 Chronic obstructive pulmonary disease, unspecified: Secondary | ICD-10-CM | POA: Diagnosis present

## 2016-01-03 DIAGNOSIS — R404 Transient alteration of awareness: Secondary | ICD-10-CM | POA: Diagnosis not present

## 2016-01-03 DIAGNOSIS — Z7982 Long term (current) use of aspirin: Secondary | ICD-10-CM

## 2016-01-03 DIAGNOSIS — F3162 Bipolar disorder, current episode mixed, moderate: Secondary | ICD-10-CM | POA: Diagnosis present

## 2016-01-03 DIAGNOSIS — R262 Difficulty in walking, not elsewhere classified: Secondary | ICD-10-CM

## 2016-01-03 DIAGNOSIS — R569 Unspecified convulsions: Secondary | ICD-10-CM

## 2016-01-03 DIAGNOSIS — E785 Hyperlipidemia, unspecified: Secondary | ICD-10-CM | POA: Diagnosis present

## 2016-01-03 DIAGNOSIS — F319 Bipolar disorder, unspecified: Secondary | ICD-10-CM | POA: Diagnosis not present

## 2016-01-03 DIAGNOSIS — Z87891 Personal history of nicotine dependence: Secondary | ICD-10-CM

## 2016-01-03 DIAGNOSIS — R4182 Altered mental status, unspecified: Secondary | ICD-10-CM | POA: Diagnosis not present

## 2016-01-03 DIAGNOSIS — G9340 Encephalopathy, unspecified: Secondary | ICD-10-CM | POA: Diagnosis not present

## 2016-01-03 DIAGNOSIS — I10 Essential (primary) hypertension: Secondary | ICD-10-CM | POA: Diagnosis present

## 2016-01-03 DIAGNOSIS — I482 Chronic atrial fibrillation: Secondary | ICD-10-CM | POA: Diagnosis not present

## 2016-01-03 DIAGNOSIS — F341 Dysthymic disorder: Secondary | ICD-10-CM | POA: Diagnosis present

## 2016-01-03 DIAGNOSIS — G9389 Other specified disorders of brain: Secondary | ICD-10-CM | POA: Diagnosis not present

## 2016-01-03 DIAGNOSIS — Z79899 Other long term (current) drug therapy: Secondary | ICD-10-CM | POA: Diagnosis not present

## 2016-01-03 DIAGNOSIS — F3160 Bipolar disorder, current episode mixed, unspecified: Secondary | ICD-10-CM | POA: Diagnosis present

## 2016-01-03 DIAGNOSIS — R279 Unspecified lack of coordination: Secondary | ICD-10-CM | POA: Diagnosis not present

## 2016-01-03 DIAGNOSIS — E876 Hypokalemia: Secondary | ICD-10-CM | POA: Diagnosis present

## 2016-01-03 DIAGNOSIS — R55 Syncope and collapse: Secondary | ICD-10-CM | POA: Diagnosis not present

## 2016-01-03 DIAGNOSIS — G4089 Other seizures: Secondary | ICD-10-CM | POA: Diagnosis not present

## 2016-01-03 DIAGNOSIS — J189 Pneumonia, unspecified organism: Secondary | ICD-10-CM | POA: Diagnosis not present

## 2016-01-03 DIAGNOSIS — G40901 Epilepsy, unspecified, not intractable, with status epilepticus: Principal | ICD-10-CM | POA: Diagnosis present

## 2016-01-03 DIAGNOSIS — G40909 Epilepsy, unspecified, not intractable, without status epilepticus: Secondary | ICD-10-CM

## 2016-01-03 DIAGNOSIS — G459 Transient cerebral ischemic attack, unspecified: Secondary | ICD-10-CM | POA: Diagnosis not present

## 2016-01-03 DIAGNOSIS — F418 Other specified anxiety disorders: Secondary | ICD-10-CM | POA: Diagnosis present

## 2016-01-03 DIAGNOSIS — R29818 Other symptoms and signs involving the nervous system: Secondary | ICD-10-CM | POA: Diagnosis not present

## 2016-01-03 DIAGNOSIS — Z7401 Bed confinement status: Secondary | ICD-10-CM | POA: Diagnosis not present

## 2016-01-03 DIAGNOSIS — R509 Fever, unspecified: Secondary | ICD-10-CM | POA: Diagnosis not present

## 2016-01-03 LAB — COMPREHENSIVE METABOLIC PANEL
ALBUMIN: 4.2 g/dL (ref 3.5–5.0)
ALT: 10 U/L — ABNORMAL LOW (ref 17–63)
AST: 25 U/L (ref 15–41)
Alkaline Phosphatase: 102 U/L (ref 38–126)
Anion gap: 12 (ref 5–15)
BUN: 12 mg/dL (ref 6–20)
CHLORIDE: 96 mmol/L — AB (ref 101–111)
CO2: 26 mmol/L (ref 22–32)
Calcium: 9.5 mg/dL (ref 8.9–10.3)
Creatinine, Ser: 0.97 mg/dL (ref 0.61–1.24)
GFR calc Af Amer: >60 mL/min (ref >60)
Glucose, Bld: 105 mg/dL — ABNORMAL HIGH (ref 65–99)
POTASSIUM: 4.6 mmol/L (ref 3.5–5.1)
Sodium: 134 mmol/L — ABNORMAL LOW (ref 135–145)
Total Bilirubin: 1.1 mg/dL (ref 0.3–1.2)
Total Protein: 8 g/dL (ref 6.5–8.1)

## 2016-01-03 LAB — APTT: APTT: 28 s (ref 24–36)

## 2016-01-03 LAB — CBC
HCT: 50.8 % (ref 39.0–52.0)
Hemoglobin: 17 g/dL (ref 13.0–17.0)
MCH: 32.5 pg (ref 26.0–34.0)
MCHC: 33.5 g/dL (ref 30.0–36.0)
MCV: 97.1 fL (ref 78.0–100.0)
PLATELETS: 244 10*3/uL (ref 150–400)
RBC: 5.23 MIL/uL (ref 4.22–5.81)
RDW: 14.6 % (ref 11.5–15.5)
WBC: 12.5 10*3/uL — ABNORMAL HIGH (ref 4.0–10.5)

## 2016-01-03 LAB — PROTIME-INR
INR: 0.93
PROTHROMBIN TIME: 12.5 s (ref 11.4–15.2)

## 2016-01-03 LAB — DIFFERENTIAL
BASOS ABS: 0 10*3/uL (ref 0.0–0.1)
BASOS PCT: 0 %
EOS ABS: 0 10*3/uL (ref 0.0–0.7)
Eosinophils Relative: 0 %
Lymphocytes Relative: 12 %
Lymphs Abs: 1.5 10*3/uL (ref 0.7–4.0)
Monocytes Absolute: 0.7 10*3/uL (ref 0.1–1.0)
Monocytes Relative: 6 %
NEUTROS PCT: 82 %
Neutro Abs: 10.3 10*3/uL — ABNORMAL HIGH (ref 1.7–7.7)

## 2016-01-03 LAB — I-STAT CHEM 8, ED
BUN: 13 mg/dL (ref 6–20)
Calcium, Ion: 1.06 mmol/L — ABNORMAL LOW (ref 1.15–1.40)
Chloride: 101 mmol/L (ref 101–111)
Creatinine, Ser: 0.8 mg/dL (ref 0.61–1.24)
Glucose, Bld: 91 mg/dL (ref 65–99)
HEMATOCRIT: 50 % (ref 39.0–52.0)
HEMOGLOBIN: 17 g/dL (ref 13.0–17.0)
POTASSIUM: 5 mmol/L (ref 3.5–5.1)
Sodium: 136 mmol/L (ref 135–145)
TCO2: 25 mmol/L (ref 0–100)

## 2016-01-03 LAB — ETHANOL

## 2016-01-03 LAB — VALPROIC ACID LEVEL: VALPROIC ACID LVL: 56 ug/mL (ref 50.0–100.0)

## 2016-01-03 LAB — CBG MONITORING, ED: Glucose-Capillary: 103 mg/dL — ABNORMAL HIGH (ref 65–99)

## 2016-01-03 MED ORDER — LORAZEPAM 2 MG/ML IJ SOLN
INTRAMUSCULAR | Status: AC
  Administered 2016-01-03: 2 mg
  Filled 2016-01-03: qty 1

## 2016-01-03 MED ORDER — LEVETIRACETAM IN NACL 1000 MG/100ML IV SOLN
1000.0000 mg | Freq: Once | INTRAVENOUS | Status: AC
  Administered 2016-01-03: 1000 mg via INTRAVENOUS
  Filled 2016-01-03: qty 100

## 2016-01-03 NOTE — ED Provider Notes (Signed)
[facility] DEPT Provider Note   CSN: [id number] Arrival date & time: 01/03/16  2015 By signing my name below, I, Dyke Brackett, attest that this documentation has been prepared under the direction and in the presence of Fredia Sorrow, MD . Electronically Signed: Dyke Brackett, Scribe. 01/03/2016. 8:44 PM.   History   Chief Complaint Chief Complaint  Patient presents with  . Code Stroke   LEVEL 5 CAVEAT: PT NONVERBAL  HPI Jeffrey Sweeney is a 69 y.o. male with a hx of A-fib, CAD, COPD, stroke, and seizure who presents to the Emergency Department due to altered mental status, last seen normal several hours PTA Per EMS, pt was found laying on the floor by his son. Pt has since been nonverbal and confused; pt has left arm contracted and pt is pulling to the left.  Pt is ambulatory and vocal at baseline. Stats on 2L are 92-93%.  The history is provided by the EMS personnel. The history is limited by the condition of the patient. No language interpreter was used.   Past Medical History:  Diagnosis Date  . AF (atrial fibrillation) ([place])   . Bipolar disorder ([place])   . CAD (coronary artery disease)   . COPD (chronic obstructive pulmonary disease) ([place])   . Depression   . Headache   . Heart disease   . Hypertension   . Seizures ([place])   . Stroke [facility])    left arm weakness    Patient Active Problem List   Diagnosis Date Noted  . HCAP (healthcare-associated pneumonia) 01/11/2015  . Fever 01/10/2015  . Osteoporosis 01/07/2015  . Compression fracture of lumbar spine, non-traumatic ([place]) 01/07/2015  . COPD exacerbation ([place]) 01/05/2015  . Falls 01/05/2015  . Hypokalemia 08/14/2011  . DVT (deep venous thrombosis) ([place]) 08/14/2011  . Atrial fibrillation ([place]) 08/13/2011  . Rib fractures 08/07/2011  . Respiratory distress 08/07/2011  . COPD (chronic obstructive pulmonary disease) ([place]) 08/07/2011  . Atelectasis 08/07/2011  . H/O chest tube placement 08/07/2011  . Hemothorax  08/07/2011  . Hyperlipidemia 07/17/2010  . ANXIETY DEPRESSION 07/17/2010  . Essential hypertension 07/17/2010  . Coronary atherosclerosis 07/17/2010  . CORONARY ATHEROSCLEROSIS NATIVE CORONARY ARTERY 07/17/2010  . STROKE 07/17/2010  . CEREBROVASCULAR DISEASE 07/17/2010  . GERD 07/17/2010  . Seizure disorder ([place]) 07/17/2010  . SPLENIC INFARCTION 07/05/2009  . EMBOLISM&THROMBOSIS OF OTHER SPECIFIED ARTERY 07/05/2009  . EARLY SATIETY 07/05/2009  . WEIGHT LOSS, ABNORMAL 07/05/2009  . DIARRHEA 07/05/2009  . LUQ PAIN 07/05/2009  . PERSONAL HISTORY OF COLONIC POLYPS 07/05/2009  . Orthostatic hypotension 05/01/2009  . SYNCOPE 05/01/2009  . CHEST PAIN 05/01/2009  . LOW BACK PAIN, MILD 04/20/2009  . Bipolar 1 disorder, mixed, moderate ([place]) 04/20/2009    Past Surgical History:  Procedure Laterality Date  . ANKLE FRACTURE SURGERY      Home Medications    Prior to Admission medications   Medication Sig Start Date End Date Taking? Authorizing Provider  albuterol (PROVENTIL HFA) 108 (90 BASE) MCG/ACT inhaler Inhale 2 puffs into the lungs every 6 (six) hours as needed for wheezing or shortness of breath.    Historical Provider, MD  albuterol (PROVENTIL) (2.5 MG/3ML) 0.083% nebulizer solution Take 3 mLs (2.5 mg total) by nebulization every 2 (two) hours as needed for wheezing or shortness of breath. Patient not taking: Reported on 05/01/2015 01/07/15   Sinda Du, MD  alendronate (FOSAMAX) 10 MG tablet Take 4 tablets (40 mg total) by mouth every 7 (seven) days. Take with a full glass  of water on an empty stomach. Patient not taking: Reported on 04/17/2015 01/08/15   Sinda Du, MD  ALPRAZolam Duanne Moron) 1 MG tablet Take 1 tablet (1 mg total) by mouth 3 (three) times daily as needed. For anxiety Patient not taking: Reported on 04/17/2015 01/12/15   Reyne Dumas, MD  amLODipine (NORVASC) 5 MG tablet Take 5 mg by mouth daily.    Historical Provider, MD  aspirin EC 81 MG tablet Take 1 tablet (81  mg total) by mouth daily. 01/12/15   Reyne Dumas, MD  atorvastatin (LIPITOR) 40 MG tablet Take 40 mg by mouth at bedtime.     Historical Provider, MD  budesonide-formoterol (SYMBICORT) 160-4.5 MCG/ACT inhaler Inhale 2 puffs into the lungs 2 (two) times daily. Patient not taking: Reported on 03/01/2015 01/07/15   Sinda Du, MD  Cholecalciferol (VITAMIN D) 2000 units CAPS Take 1 capsule by mouth daily. Reported on 05/01/2015    Historical Provider, MD  fluticasone furoate-vilanterol (BREO ELLIPTA) 100-25 MCG/INH AEPB Inhale 1 puff into the lungs daily. Reported on 05/01/2015    Historical Provider, MD  levETIRAcetam (KEPPRA) 1000 MG tablet Take 1,000 mg by mouth 3 (three) times daily.    Historical Provider, MD  levofloxacin (LEVAQUIN) 750 MG tablet Take 1 tablet (750 mg total) by mouth daily. Patient not taking: Reported on 03/01/2015 01/12/15   Reyne Dumas, MD  midodrine (PROAMATINE) 5 MG tablet Take 1 tablet (5 mg total) by mouth 3 (three) times daily with meals. 01/07/15   Sinda Du, MD  nitroGLYCERIN (NITROSTAT) 0.4 MG SL tablet Place 0.4 mg under the tongue every 5 (five) minutes as needed. Reported on 05/01/2015    Historical Provider, MD  oxyCODONE (OXY IR/ROXICODONE) 5 MG immediate release tablet Take 1 tablet (5 mg total) by mouth every 4 (four) hours as needed for moderate pain. 01/12/15   Reyne Dumas, MD  pantoprazole (PROTONIX) 40 MG tablet Take 40 mg by mouth daily.      Historical Provider, MD  potassium chloride SA (K-DUR,KLOR-CON) 20 MEQ tablet Take 20 mEq by mouth 3 (three) times daily.     Historical Provider, MD  predniSONE (DELTASONE) 20 MG tablet Take 2 tablets (40 mg total) by mouth daily with breakfast. Patient not taking: Reported on 03/01/2015 01/07/15   Sinda Du, MD  tiotropium (SPIRIVA) 18 MCG inhalation capsule Place 1 capsule (18 mcg total) into inhaler and inhale daily. 01/07/15   Sinda Du, MD  Valproic Acid (DEPAKENE) 250 MG/5ML SYRP syrup Take 250 mg by  mouth 2 (two) times daily.     Historical Provider, MD    Family History Family History  Problem Relation Age of Onset  . Cancer Mother   . Cancer Father     Social History Social History  Substance Use Topics  . Smoking status: Former Smoker    Packs/day: 0.50    Years: 50.00    Types: Cigarettes    Quit date: 01/07/2014  . Smokeless tobacco: Never Used  . Alcohol use No     Allergies   Meperidine hcl   Review of Systems Review of Systems  Unable to perform ROS: Patient nonverbal   Physical Exam Updated Vital Signs BP 111/65   Pulse 65   Temp 98.5 F (36.9 C) (Rectal)   Resp 17   Ht 5\' 10"  (1.778 m)   Wt 77.1 kg   SpO2 92%   BMI 24.39 kg/m   Physical Exam  Constitutional: He appears well-developed and well-nourished. No distress.  HENT:  Head: Normocephalic and atraumatic.  Eyes: Conjunctivae are normal. Pupils are equal, round, and reactive to light.  Eyes deviated to left  Cardiovascular: Normal rate, regular rhythm and normal heart sounds.  Exam reveals no gallop and no friction rub.   No murmur heard. Pulmonary/Chest: Effort normal and breath sounds normal. No respiratory distress. He has no wheezes. He has no rales.  Lungs are clear bilaterally  Abdominal: He exhibits no distension.  Neurological:  Purposefully moving right side; pulling towards left side  Skin: Skin is warm and dry.  Psychiatric: He has a normal mood and affect.  Nursing note and vitals reviewed.  ED Treatments / Results  DIAGNOSTIC STUDIES:  8:44 PM Oxygen Saturation is 93% on 2L , low by my interpretation.    Labs (all labs ordered are listed, but only abnormal results are displayed) Labs Reviewed  CBC - Abnormal; Notable for the following:       Result Value   WBC 12.5 (*)    All other components within normal limits  DIFFERENTIAL - Abnormal; Notable for the following:    Neutro Abs 10.3 (*)    All other components within normal limits  COMPREHENSIVE METABOLIC PANEL  - Abnormal; Notable for the following:    Sodium 134 (*)    Chloride 96 (*)    Glucose, Bld 105 (*)    ALT 10 (*)    All other components within normal limits  CBG MONITORING, ED - Abnormal; Notable for the following:    Glucose-Capillary 103 (*)    All other components within normal limits  ETHANOL  PROTIME-INR  APTT  VALPROIC ACID LEVEL  RAPID URINE DRUG SCREEN, HOSP PERFORMED  URINALYSIS, ROUTINE W REFLEX MICROSCOPIC  I-STAT CHEM 8, ED  I-STAT TROPOININ, ED    EKG  EKG Interpretation  Date/Time:  Wednesday January 03 2016 20:23:05 EST Ventricular Rate:  86 PR Interval:    QRS Duration: 99 QT Interval:  353 QTC Calculation: 423 R Axis:   83 Text Interpretation:  Sinus rhythm Atrial premature complexes Borderline right axis deviation Nonspecific T abnormalities, anterior leads Baseline wander in lead(s) V2 Confirmed by Rogene Houston  MD, Kaushal Vannice [phone]) on 01/03/2016 9:14:22 PM       Radiology Dg Chest Port 1 View  Result Date: 01/03/2016 CLINICAL DATA:  Patient found on floor.  Nonverbal and confused. EXAM: PORTABLE CHEST 1 VIEW COMPARISON:  01/10/2015 CXR FINDINGS: Stable borderline cardiomegaly with aortic atherosclerosis. Slightly low lung volumes without pneumonic consolidation or CHF. No effusion or pneumothorax. No suspicious osseous abnormality. IMPRESSION: Stable borderline cardiomegaly with aortic atherosclerosis. No acute pulmonary disease. Electronically Signed   By: Ashley Royalty M.D.   On: 01/03/2016 21:33   Ct Head Code Stroke Wo Contrast`  Result Date: 01/03/2016 CLINICAL DATA:  Code stroke. Found down by son, nonverbal and confused. History of stroke, hypertension, seizures. EXAM: CT HEAD WITHOUT CONTRAST TECHNIQUE: Contiguous axial images were obtained from the base of the skull through the vertex without intravenous contrast. COMPARISON:  MRI of the head January 11, 2015 and CT HEAD January 10, 2015 FINDINGS: BRAIN: The ventricles and sulci are normal for age. No  intraparenchymal hemorrhage, mass effect nor midline shift. Old small LEFT cerebellar infarcts. RIGHT frontotemporal parietal encephalomalacia involving the RIGHT basal ganglia. Mild ex vacuo dilatation RIGHT lateral ventricle, no hydrocephalus. Patchy white matter changes compatible with mild chronic small vessel ischemic disease. No acute large vascular territory infarcts. No abnormal extra-axial fluid collections. Basal cisterns are patent. VASCULAR: Moderate calcific  atherosclerosis of the carotid siphons. SKULL: No skull fracture. No significant scalp soft tissue swelling. SINUSES/ORBITS: Mild maxillary sinus mucosal thickening. The included ocular globes and orbital contents are non-suspicious. OTHER: None. ASPECTS [facility] Stroke Program Early CT Score) - Ganglionic level infarction (caudate, lentiform nuclei, internal capsule, insula, M1-M3 cortex): 7 - Supraganglionic infarction (M4-M6 cortex): 3 Total score (0-10 with 10 being normal): 10 IMPRESSION: 1. No acute intracranial process. Stable examination including old large RIGHT MCA territory infarct and small LEFT cerebellar infarcts. 2. ASPECTS is 10. Critical Value/emergent results were called by telephone at the time of interpretation on 01/03/2016 at 8:45 pm to Dr. Fredia Sorrow , who verbally acknowledged these results. Electronically Signed   By: Elon Alas M.D.   On: 01/03/2016 20:45    Procedures Procedures (including critical care time)  CRITICAL CARE Performed by: Fredia Sorrow Total critical care time: 30 minutes Critical care time was exclusive of separately billable procedures and treating other patients. Critical care was necessary to treat or prevent imminent or life-threatening deterioration. Critical care was time spent personally by me on the following activities: development of treatment plan with patient and/or surrogate as well as nursing, discussions with consultants, evaluation of patient's response to  treatment, examination of patient, obtaining history from patient or surrogate, ordering and performing treatments and interventions, ordering and review of laboratory studies, ordering and review of radiographic studies, pulse oximetry and re-evaluation of patient's condition.   Medications Ordered in ED Medications  LORazepam (ATIVAN) 2 MG/ML injection (2 mg  Given 01/03/16 2040)  levETIRAcetam (KEPPRA) IVPB 1000 mg/100 mL premix (1,000 mg Intravenous Given 01/03/16 2126)     Initial Impression / Assessment and Plan / ED Course  I have reviewed the triage vital signs and the nursing notes.  Pertinent labs & imaging results that were available during my care of the patient were reviewed by me and considered in my medical decision making (see chart for details).  Clinical Course as of Jan 02 2310  Wed Jan 03, 2016  2122 Neuro hospitalist thinks pt had a seizure and is postictal, recommends Keppra. Does not think pt had a stroke.   [EH]    Clinical Course User Index [EH] Dyke Brackett    Patient arrived by EMS as a code stroke. Patient evaluated since a head CT. CT showed no acute changes just evidence of a chronic old large right MCA infarct.  According to EMS and according to the son at home but did not come in. Patient was fine 2 hours prior is usual self. In the son found him on the floor waited about 30 minutes he did not improve patient was nonverbal nonverbal upon arrival.  After coming back from head CT patient does seem to be having seizure activity with tonic-clonic movement of the left side. Patient given 2 Ativan. With improvement. Patient evaluated by telephone neurologist as part of the code stroke protocol. They felt that he was post ictal and that this was seizure activity no evidence of stroke.  Patient loaded with Keppra 1000 mg IV.  Patient has had spontaneous movement of his right side but seems not to be purposeful. When you call his name he wants to turn to the  left. Has had some movement of his left hand and left leg. Eyes seem to track. Patient cannot follow commands.  Vital signs normal labs normal. Depakote level is therapeutic. Discussed with hospitalist they will admit for the altered mental status and probably seizure activity. Do not feel  patient is actively seizing currently.  Final Clinical Impressions(s) / ED Diagnoses   Final diagnoses:  Stroke (cerebrum) ([place])  Seizure ([place])    New Prescriptions New Prescriptions   No medications on file  I personally performed the services described in this documentation, which was scribed in my presence. The recorded information has been reviewed and is accurate.      Fredia Sorrow, MD 01/03/16 [phone]

## 2016-01-03 NOTE — ED Notes (Signed)
This RN spoke with pt's son Celestine Laviola, he stated pt was found on the floor, son states he tried to assist pt up, but pt was unable to get up and unable to speak; son then called ems;  Son states pt was c/o left arm contracting yesterday and stomach cramping today

## 2016-01-03 NOTE — Progress Notes (Signed)
NO BEEPER CALL FROM ER 822PM EXAM FINISHED 835PM EXAM SENT TO SOC AND CALLED GR 837PM

## 2016-01-03 NOTE — ED Triage Notes (Signed)
Pt was found lying in floor by son; son states pt is usually up and walking and talking; pt has been non verbal and confused; pt is looking to left and pulling to left; left arm is contracted; pt is unable to follow commands

## 2016-01-03 NOTE — ED Notes (Signed)
Pt's son, Reynaldo Canjura [phone]

## 2016-01-03 NOTE — H&P (Signed)
History and Physical    Jeffrey Sweeney [id number] DOB: [birth year] DOA: 01/03/2016  PCP: Alonza Bogus, MD   Patient coming from: Home.  Chief Complaint: AMS.  HPI: Jeffrey Sweeney is a 70 y.o. male with medical history significant of atrial fibrillation, bipolar disorder, CAD, COPD, depression, headaches, hypertension, seizure disorder, history of CVA with residual left arm weakness who was brought to the emergency department today after the son found him lying down on the floor. He will last seen at his baseline several hours before his son found him. The patient normally is able to ambulate with assistive devices and is able to converse. No further history was provided by the son.  ED Course: Code stroke was called. CT scan was negative for acute event, but shows large previous R MCA stroke and several small  Left cerebellar infarcts. Tele-neurology consultant suggested Keppra loading due to seizure.   ICU course: The patient had a 100.7 temperature which was treated aggressively with Tylenol PR and Toradol 15 mg IVP. Ativan 1 mg IVP was given for restlessness and seizure prevention. BCx2 drawn, UA is still pending. Vanco and Zosyn per pharmacy were ordered.  Review of Systems: As per HPI otherwise 10 point review of systems negative.    Past Medical History:  Diagnosis Date  . AF (atrial fibrillation) ([place])   . Bipolar disorder ([place])   . CAD (coronary artery disease)   . COPD (chronic obstructive pulmonary disease) ([place])   . Depression   . Headache   . Heart disease   . Hypertension   . Seizures ([place])   . Stroke [facility])    left arm weakness    Past Surgical History:  Procedure Laterality Date  . ANKLE FRACTURE SURGERY       reports that he quit smoking about 1 years ago. His smoking use included Cigarettes. He has a 25.00 pack-year smoking history. He has never used smokeless tobacco. He reports that he does not drink alcohol or use drugs.  Allergies  Allergen  Reactions  . Meperidine Hcl Nausea And Vomiting    Family History  Problem Relation Age of Onset  . Cancer Mother   . Cancer Father     Prior to Admission medications   Medication Sig Start Date End Date Taking? Authorizing Provider  albuterol (PROVENTIL HFA) 108 (90 BASE) MCG/ACT inhaler Inhale 2 puffs into the lungs every 6 (six) hours as needed for wheezing or shortness of breath.    Historical Provider, MD  albuterol (PROVENTIL) (2.5 MG/3ML) 0.083% nebulizer solution Take 3 mLs (2.5 mg total) by nebulization every 2 (two) hours as needed for wheezing or shortness of breath. Patient not taking: Reported on 05/01/2015 01/07/15   Sinda Du, MD  alendronate (FOSAMAX) 10 MG tablet Take 4 tablets (40 mg total) by mouth every 7 (seven) days. Take with a full glass of water on an empty stomach. Patient not taking: Reported on 04/17/2015 01/08/15   Sinda Du, MD  ALPRAZolam Duanne Moron) 1 MG tablet Take 1 tablet (1 mg total) by mouth 3 (three) times daily as needed. For anxiety Patient not taking: Reported on 04/17/2015 01/12/15   Reyne Dumas, MD  amLODipine (NORVASC) 5 MG tablet Take 5 mg by mouth daily.    Historical Provider, MD  aspirin EC 81 MG tablet Take 1 tablet (81 mg total) by mouth daily. 01/12/15   Reyne Dumas, MD  atorvastatin (LIPITOR) 40 MG tablet Take 40 mg by mouth at bedtime.     Historical  Provider, MD  budesonide-formoterol (SYMBICORT) 160-4.5 MCG/ACT inhaler Inhale 2 puffs into the lungs 2 (two) times daily. Patient not taking: Reported on 03/01/2015 01/07/15   Sinda Du, MD  Cholecalciferol (VITAMIN D) 2000 units CAPS Take 1 capsule by mouth daily. Reported on 05/01/2015    Historical Provider, MD  fluticasone furoate-vilanterol (BREO ELLIPTA) 100-25 MCG/INH AEPB Inhale 1 puff into the lungs daily. Reported on 05/01/2015    Historical Provider, MD  levETIRAcetam (KEPPRA) 1000 MG tablet Take 1,000 mg by mouth 3 (three) times daily.    Historical Provider, MD  levofloxacin  (LEVAQUIN) 750 MG tablet Take 1 tablet (750 mg total) by mouth daily. Patient not taking: Reported on 03/01/2015 01/12/15   Reyne Dumas, MD  midodrine (PROAMATINE) 5 MG tablet Take 1 tablet (5 mg total) by mouth 3 (three) times daily with meals. 01/07/15   Sinda Du, MD  nitroGLYCERIN (NITROSTAT) 0.4 MG SL tablet Place 0.4 mg under the tongue every 5 (five) minutes as needed. Reported on 05/01/2015    Historical Provider, MD  oxyCODONE (OXY IR/ROXICODONE) 5 MG immediate release tablet Take 1 tablet (5 mg total) by mouth every 4 (four) hours as needed for moderate pain. 01/12/15   Reyne Dumas, MD  pantoprazole (PROTONIX) 40 MG tablet Take 40 mg by mouth daily.      Historical Provider, MD  potassium chloride SA (K-DUR,KLOR-CON) 20 MEQ tablet Take 20 mEq by mouth 3 (three) times daily.     Historical Provider, MD  predniSONE (DELTASONE) 20 MG tablet Take 2 tablets (40 mg total) by mouth daily with breakfast. Patient not taking: Reported on 03/01/2015 01/07/15   Sinda Du, MD  tiotropium (SPIRIVA) 18 MCG inhalation capsule Place 1 capsule (18 mcg total) into inhaler and inhale daily. 01/07/15   Sinda Du, MD  Valproic Acid (DEPAKENE) 250 MG/5ML SYRP syrup Take 250 mg by mouth 2 (two) times daily.     Historical Provider, MD    Physical Exam:  Constitutional: NAD, calm, comfortable Vitals:   01/03/16 2200 01/03/16 2215 01/03/16 2230 01/03/16 2245  BP: 109/67  118/83 111/65  Pulse:   64 65  Resp: 19 22 22 17   Temp:      TempSrc:      SpO2:   93% 92%  Weight:      Height:       Eyes: PERRL, lids and conjunctivae normal ENMT: Mucous membranes are moist. Posterior pharynx clear of any exudate or lesions. Absent and poor state of repair of remaining teeth.  Neck: Normal, supple, no masses, no thyromegaly Respiratory: Clear to auscultation bilaterally, no wheezing, no crackles. Normal respiratory effort. No accessory muscle use.  Cardiovascular: Regular rate and rhythm, no murmurs /  rubs / gallops. No extremity edema. 2+ pedal pulses. No carotid bruits.  Abdomen: Soft, no tenderness, no masses palpated. No hepatosplenomegaly. Bowel sounds positive.  Musculoskeletal: No clubbing / cyanosis. Good ROM, left upper extremity contracture.  Skin: No significant  rashes, lesions, ulcers. Neurologic: Postictal, gazes to the left, left upper extremity contracture, unable to fully evaluate.  Psychiatric: Postictal.  Labs on Admission: I have personally reviewed following labs and imaging studies  CBC:  Recent Labs Lab 01/03/16 2015  WBC 12.5*  NEUTROABS 10.3*  HGB 17.0  HCT 50.8  MCV 97.1  PLT [id number]   Basic Metabolic Panel:  Recent Labs Lab 01/03/16 2015  NA 134*  K 4.6  CL 96*  CO2 26  GLUCOSE 105*  BUN 12  CREATININE 0.97  CALCIUM  9.5   GFR: Estimated Creatinine Clearance: 73.2 mL/min (by C-G formula based on SCr of 0.97 mg/dL). Liver Function Tests:  Recent Labs Lab 01/03/16 2015  AST 25  ALT 10*  ALKPHOS 102  BILITOT 1.1  PROT 8.0  ALBUMIN 4.2   No results for input(s): LIPASE, AMYLASE in the last 168 hours. No results for input(s): AMMONIA in the last 168 hours. Coagulation Profile:  Recent Labs Lab 01/03/16 2015  INR 0.93   Cardiac Enzymes: No results for input(s): CKTOTAL, CKMB, CKMBINDEX, TROPONINI in the last 168 hours. BNP (last 3 results) No results for input(s): PROBNP in the last 8760 hours. HbA1C: No results for input(s): HGBA1C in the last 72 hours. CBG:  Recent Labs Lab 01/03/16 2019  GLUCAP 103*   Lipid Profile: No results for input(s): CHOL, HDL, LDLCALC, TRIG, CHOLHDL, LDLDIRECT in the last 72 hours. Thyroid Function Tests: No results for input(s): TSH, T4TOTAL, FREET4, T3FREE, THYROIDAB in the last 72 hours. Anemia Panel: No results for input(s): VITAMINB12, FOLATE, FERRITIN, TIBC, IRON, RETICCTPCT in the last 72 hours. Urine analysis:    Component Value Date/Time   COLORURINE YELLOW 01/10/2015 2108    APPEARANCEUR CLEAR 01/10/2015 2108   LABSPEC 1.026 01/10/2015 2108   PHURINE 6.0 01/10/2015 2108   GLUCOSEU 100 (A) 01/10/2015 2108   HGBUR NEGATIVE 01/10/2015 2108   BILIRUBINUR NEGATIVE 01/10/2015 2108   KETONESUR NEGATIVE 01/10/2015 2108   PROTEINUR NEGATIVE 01/10/2015 2108   UROBILINOGEN 2.0 (H) 08/10/2011 1731   NITRITE NEGATIVE 01/10/2015 2108   LEUKOCYTESUR NEGATIVE 01/10/2015 2108    Radiological Exams on Admission: Dg Chest Port 1 View  Result Date: 01/03/2016 CLINICAL DATA:  Patient found on floor.  Nonverbal and confused. EXAM: PORTABLE CHEST 1 VIEW COMPARISON:  01/10/2015 CXR FINDINGS: Stable borderline cardiomegaly with aortic atherosclerosis. Slightly low lung volumes without pneumonic consolidation or CHF. No effusion or pneumothorax. No suspicious osseous abnormality. IMPRESSION: Stable borderline cardiomegaly with aortic atherosclerosis. No acute pulmonary disease. Electronically Signed   By: Ashley Royalty M.D.   On: 01/03/2016 21:33   Ct Head Code Stroke Wo Contrast`  Result Date: 01/03/2016 CLINICAL DATA:  Code stroke. Found down by son, nonverbal and confused. History of stroke, hypertension, seizures. EXAM: CT HEAD WITHOUT CONTRAST TECHNIQUE: Contiguous axial images were obtained from the base of the skull through the vertex without intravenous contrast. COMPARISON:  MRI of the head January 11, 2015 and CT HEAD January 10, 2015 FINDINGS: BRAIN: The ventricles and sulci are normal for age. No intraparenchymal hemorrhage, mass effect nor midline shift. Old small LEFT cerebellar infarcts. RIGHT frontotemporal parietal encephalomalacia involving the RIGHT basal ganglia. Mild ex vacuo dilatation RIGHT lateral ventricle, no hydrocephalus. Patchy white matter changes compatible with mild chronic small vessel ischemic disease. No acute large vascular territory infarcts. No abnormal extra-axial fluid collections. Basal cisterns are patent. VASCULAR: Moderate calcific atherosclerosis of  the carotid siphons. SKULL: No skull fracture. No significant scalp soft tissue swelling. SINUSES/ORBITS: Mild maxillary sinus mucosal thickening. The included ocular globes and orbital contents are non-suspicious. OTHER: None. ASPECTS [facility] Stroke Program Early CT Score) - Ganglionic level infarction (caudate, lentiform nuclei, internal capsule, insula, M1-M3 cortex): 7 - Supraganglionic infarction (M4-M6 cortex): 3 Total score (0-10 with 10 being normal): 10 IMPRESSION: 1. No acute intracranial process. Stable examination including old large RIGHT MCA territory infarct and small LEFT cerebellar infarcts. 2. ASPECTS is 10. Critical Value/emergent results were called by telephone at the time of interpretation on 01/03/2016 at 8:45 pm to Dr.  SCOTT ZACKOWSKI , who verbally acknowledged these results. Electronically Signed   By: Elon Alas M.D.   On: 01/03/2016 20:45    EKG: Independently reviewed. Vent. rate 86 BPM PR interval * ms QRS duration 99 ms QT/QTc 353/423 ms P-R-T axes 50 83 42 Sinus rhythm Atrial premature complexes Borderline right axis deviation Nonspecific T abnormalities, anterior leads Baseline wander in lead(s) V2  Assessment/Plan Principal Problem:   Seizure disorder (HCC) Admit to stepdown unit/inpatient. Continue supplemental oxygen. Continue Keppra. Consider MRI imaging if persists. Neurology evaluation.  Active Problems:   Fever Chest radiograph does not show acute infiltrate. Urine analysis is still pending. Continue vancomycin and Zosyn per pharmacy. Blood cultures are pending.    Stroke [facility]) Supportive care.    Hyperlipidemia Resume Atorvastatin once cleared for oral intake.    ANXIETY DEPRESSION Lorazepam IV PRN for restlessness. Not an antidepressant.    Essential hypertension Amlodipine is on hold. The patient is also on midodrine? Monitor blood pressure.    Coronary atherosclerosis Resume aspirin atorvastatin once tolerating oral  intake      GERD Famotidine 20 mg IVP every 12 hours.     DVT prophylaxis: Lovenox SQ. Code Status: Full code. Family Communication:  Disposition Plan: Admit for IV antibiotic therapy Consults called: Admission status: Inpatient/stepdown.   Reubin Milan MD [facility] Pager [phone].   If 7PM-7AM, please contact night-coverage [email or web address] Password [email or web address]  01/03/2016, 11:12 PM

## 2016-01-04 ENCOUNTER — Inpatient Hospital Stay (HOSPITAL_COMMUNITY): Payer: Commercial Managed Care - HMO

## 2016-01-04 DIAGNOSIS — G9340 Encephalopathy, unspecified: Secondary | ICD-10-CM | POA: Diagnosis not present

## 2016-01-04 DIAGNOSIS — R569 Unspecified convulsions: Secondary | ICD-10-CM | POA: Diagnosis not present

## 2016-01-04 LAB — COMPREHENSIVE METABOLIC PANEL
ALBUMIN: 3.6 g/dL (ref 3.5–5.0)
ALT: 9 U/L — ABNORMAL LOW (ref 17–63)
AST: 28 U/L (ref 15–41)
Alkaline Phosphatase: 81 U/L (ref 38–126)
Anion gap: 9 (ref 5–15)
BILIRUBIN TOTAL: 1.2 mg/dL (ref 0.3–1.2)
BUN: 13 mg/dL (ref 6–20)
CO2: 27 mmol/L (ref 22–32)
CREATININE: 0.91 mg/dL (ref 0.61–1.24)
Calcium: 8.8 mg/dL — ABNORMAL LOW (ref 8.9–10.3)
Chloride: 99 mmol/L — ABNORMAL LOW (ref 101–111)
GFR calc Af Amer: >60 mL/min (ref >60)
GFR calc non Af Amer: >60 mL/min (ref >60)
GLUCOSE: 104 mg/dL — AB (ref 65–99)
Potassium: 3.9 mmol/L (ref 3.5–5.1)
Sodium: 135 mmol/L (ref 135–145)
TOTAL PROTEIN: 6.9 g/dL (ref 6.5–8.1)

## 2016-01-04 LAB — URINALYSIS, ROUTINE W REFLEX MICROSCOPIC
GLUCOSE, UA: NEGATIVE mg/dL
Hgb urine dipstick: NEGATIVE
KETONES UR: 40 mg/dL — AB
Leukocytes, UA: NEGATIVE
Nitrite: NEGATIVE
PH: 6 (ref 5.0–8.0)
Protein, ur: NEGATIVE mg/dL

## 2016-01-04 LAB — CBC WITH DIFFERENTIAL/PLATELET
BASOS ABS: 0 10*3/uL (ref 0.0–0.1)
BASOS PCT: 0 %
Eosinophils Absolute: 0 10*3/uL (ref 0.0–0.7)
Eosinophils Relative: 0 %
HEMATOCRIT: 47.1 % (ref 39.0–52.0)
Hemoglobin: 15.5 g/dL (ref 13.0–17.0)
LYMPHS PCT: 26 %
Lymphs Abs: 3.1 10*3/uL (ref 0.7–4.0)
MCH: 31.8 pg (ref 26.0–34.0)
MCHC: 32.9 g/dL (ref 30.0–36.0)
MCV: 96.7 fL (ref 78.0–100.0)
MONOS PCT: 9 %
Monocytes Absolute: 1.1 10*3/uL — ABNORMAL HIGH (ref 0.1–1.0)
NEUTROS ABS: 7.6 10*3/uL (ref 1.7–7.7)
NEUTROS PCT: 65 %
Platelets: 243 10*3/uL (ref 150–400)
RBC: 4.87 MIL/uL (ref 4.22–5.81)
RDW: 14.5 % (ref 11.5–15.5)
WBC: 11.8 10*3/uL — ABNORMAL HIGH (ref 4.0–10.5)

## 2016-01-04 LAB — I-STAT TROPONIN, ED: TROPONIN I, POC: 0.01 ng/mL (ref 0.00–0.08)

## 2016-01-04 LAB — MRSA PCR SCREENING: MRSA BY PCR: POSITIVE — AB

## 2016-01-04 LAB — RAPID URINE DRUG SCREEN, HOSP PERFORMED
Amphetamines: NOT DETECTED
BARBITURATES: NOT DETECTED
BENZODIAZEPINES: POSITIVE — AB
COCAINE: NOT DETECTED
Opiates: NOT DETECTED
TETRAHYDROCANNABINOL: NOT DETECTED

## 2016-01-04 MED ORDER — FAMOTIDINE IN NACL 20-0.9 MG/50ML-% IV SOLN
20.0000 mg | Freq: Two times a day (BID) | INTRAVENOUS | Status: DC
  Administered 2016-01-04 – 2016-01-08 (×10): 20 mg via INTRAVENOUS
  Filled 2016-01-04 (×11): qty 50

## 2016-01-04 MED ORDER — PIPERACILLIN-TAZOBACTAM 3.375 G IVPB
3.3750 g | Freq: Three times a day (TID) | INTRAVENOUS | Status: DC
Start: 2016-01-04 — End: 2016-01-07
  Administered 2016-01-04 – 2016-01-07 (×9): 3.375 g via INTRAVENOUS
  Filled 2016-01-04 (×9): qty 50

## 2016-01-04 MED ORDER — INFLUENZA VAC SPLIT QUAD 0.5 ML IM SUSY
0.5000 mL | PREFILLED_SYRINGE | INTRAMUSCULAR | Status: AC
  Administered 2016-01-05: 0.5 mL via INTRAMUSCULAR
  Filled 2016-01-04: qty 0.5

## 2016-01-04 MED ORDER — MUPIROCIN 2 % EX OINT
1.0000 "application " | TOPICAL_OINTMENT | Freq: Two times a day (BID) | CUTANEOUS | Status: AC
  Administered 2016-01-04 – 2016-01-08 (×10): 1 via NASAL
  Filled 2016-01-04: qty 22

## 2016-01-04 MED ORDER — SODIUM CHLORIDE 0.9 % IV SOLN
1000.0000 mg | Freq: Two times a day (BID) | INTRAVENOUS | Status: DC
  Filled 2016-01-04: qty 10

## 2016-01-04 MED ORDER — LEVETIRACETAM IN NACL 1000 MG/100ML IV SOLN
1000.0000 mg | Freq: Two times a day (BID) | INTRAVENOUS | Status: DC
  Administered 2016-01-04 – 2016-01-05 (×3): 1000 mg via INTRAVENOUS
  Filled 2016-01-04 (×11): qty 100

## 2016-01-04 MED ORDER — CHLORHEXIDINE GLUCONATE CLOTH 2 % EX PADS
6.0000 | MEDICATED_PAD | Freq: Every day | CUTANEOUS | Status: AC
  Administered 2016-01-04 – 2016-01-08 (×4): 6 via TOPICAL

## 2016-01-04 MED ORDER — ACETAMINOPHEN 650 MG RE SUPP
650.0000 mg | RECTAL | Status: DC | PRN
  Administered 2016-01-04: 650 mg via RECTAL
  Filled 2016-01-04: qty 1

## 2016-01-04 MED ORDER — PIPERACILLIN-TAZOBACTAM 3.375 G IVPB
3.3750 g | Freq: Once | INTRAVENOUS | Status: AC
  Administered 2016-01-04: 3.375 g via INTRAVENOUS
  Filled 2016-01-04: qty 50

## 2016-01-04 MED ORDER — LORAZEPAM 2 MG/ML IJ SOLN
3.0000 mg | Freq: Once | INTRAMUSCULAR | Status: AC
  Administered 2016-01-04: 3 mg via INTRAVENOUS

## 2016-01-04 MED ORDER — DEXTROSE-NACL 5-0.9 % IV SOLN
INTRAVENOUS | Status: DC
  Administered 2016-01-04 – 2016-01-05 (×3): via INTRAVENOUS
  Administered 2016-01-05: 1000 mL via INTRAVENOUS
  Administered 2016-01-06 – 2016-01-09 (×6): via INTRAVENOUS

## 2016-01-04 MED ORDER — SODIUM CHLORIDE 0.9 % IV BOLUS (SEPSIS)
1000.0000 mL | Freq: Once | INTRAVENOUS | Status: AC
  Administered 2016-01-04: 1000 mL via INTRAVENOUS

## 2016-01-04 MED ORDER — LORAZEPAM 2 MG/ML IJ SOLN
1.0000 mg | INTRAMUSCULAR | Status: DC | PRN
  Administered 2016-01-04 – 2016-01-08 (×16): 1 mg via INTRAVENOUS
  Filled 2016-01-04 (×17): qty 1

## 2016-01-04 MED ORDER — KETOROLAC TROMETHAMINE 15 MG/ML IJ SOLN
15.0000 mg | Freq: Once | INTRAMUSCULAR | Status: AC
  Administered 2016-01-04: 15 mg via INTRAVENOUS
  Filled 2016-01-04: qty 1

## 2016-01-04 MED ORDER — ENOXAPARIN SODIUM 40 MG/0.4ML ~~LOC~~ SOLN
40.0000 mg | SUBCUTANEOUS | Status: DC
  Administered 2016-01-04 – 2016-01-10 (×7): 40 mg via SUBCUTANEOUS
  Filled 2016-01-04 (×7): qty 0.4

## 2016-01-04 MED ORDER — VANCOMYCIN HCL IN DEXTROSE 1-5 GM/200ML-% IV SOLN
1000.0000 mg | Freq: Once | INTRAVENOUS | Status: AC
  Administered 2016-01-04: 1000 mg via INTRAVENOUS
  Filled 2016-01-04: qty 200

## 2016-01-04 MED ORDER — VANCOMYCIN HCL IN DEXTROSE 1-5 GM/200ML-% IV SOLN
1000.0000 mg | Freq: Two times a day (BID) | INTRAVENOUS | Status: DC
  Administered 2016-01-04 – 2016-01-07 (×6): 1000 mg via INTRAVENOUS
  Filled 2016-01-04 (×6): qty 200

## 2016-01-04 MED ORDER — LORAZEPAM 2 MG/ML IJ SOLN
1.0000 mg | Freq: Once | INTRAMUSCULAR | Status: AC
  Administered 2016-01-04: 1 mg via INTRAVENOUS
  Filled 2016-01-04: qty 1

## 2016-01-04 MED ORDER — LORAZEPAM 2 MG/ML IJ SOLN
INTRAMUSCULAR | Status: AC
  Filled 2016-01-04: qty 2

## 2016-01-04 NOTE — Consult Note (Signed)
Cody A. Merlene Laughter, MD     [email or web address]          Jeffrey Sweeney is an 70 y.o. male.   ASSESSMENT/PLAN: Acute encephalopathy with the etiology currently unclear.  The most likely scenario is that the patient had a unwitnessed seizure. Given the prolonged encephalopathy, I am concerned about ongoing nonconvulsive seizures. We will therefore restart the patient's Depakote which for some reason has not been restarted since being admitted. The dose will also be increased. He is to continue with the [place]. We will also obtain an EEG. Repeat neurological examination will be done.    The patient is 70 year old white male who has a baseline history of seizures. The patient presented on the status. He currently is on Depakote. The workup has been unrevealing for acute causes of the patient's encephalopathy. His chart reviewed indicates that he was on Keppra in January of this year. It is unclear why this medication was discontinued. His Depakote level is  just in the therapeutic range. The patient's baseline is that he ambulates without assisted devices seizure in a cane or walker. He apparently converses decently. He was loaded with Keppra on admission. The patient has remained confused and encephalopathic and unresponsive and hence the consultation. He appears to have improved modestly in the last few hours. Review of systems is imited given the unresponsiveness.    GENERAL: Patient lays in bed unresponsive.  HEENT: Supple. Atraumatic normocephalic - no oral trauma appreciated.   ABDOMEN: soft  EXTREMITIES: No edema   BACK: Normal.  SKIN: Normal by inspection.    MENTAL STATUS: The patient's eyes are closed. He does open his eyes to deep painful sternal rub. He does not follow commands. He speaks in 2-3 word sentences and says that he is doing okay. Speech is often nonsensical at times. However, he does not follow commands.  CRANIAL NERVES: Pupils are equal,  round and reactive to light; extra ocular movements are full, there is no significant nystagmus; visual fields are limited but appears to be full; upper and lower facial muscles are normal in strength and symmetric, there is no flattening of the nasolabial folds; tongue is midline.  MOTOR: The patient moves both sides well without clear focality. Bulk and tone are normal throughout.  COORDINATION: There is no evidence of dysmetria or tremors. No parkinsonism.  REFLEXES: Deep tendon reflexes are symmetrical and normal.   SENSATION: He responds to pain bilaterally.        Blood pressure 109/65, pulse (!) 45, temperature 97.4 F (36.3 C), resp. rate 15, height '5\' 7"'$  (1.702 m), weight 169 lb 15.6 oz (77.1 kg), SpO2 93 %.  Past Medical History:  Diagnosis Date  . AF (atrial fibrillation) ([place])   . Bipolar disorder ([place])   . CAD (coronary artery disease)   . COPD (chronic obstructive pulmonary disease) ([place])   . Depression   . Headache   . Heart disease   . Hypertension   . Seizures ([place])   . Stroke [facility])    left arm weakness    Past Surgical History:  Procedure Laterality Date  . ANKLE FRACTURE SURGERY      Family History  Problem Relation Age of Onset  . Cancer Mother   . Cancer Father     Social History:  reports that he quit smoking about 1 years ago. His smoking use included Cigarettes. He has a 25.00 pack-year smoking history. He has never used smokeless tobacco. He reports that he does  not drink alcohol or use drugs.  Allergies:  Allergies  Allergen Reactions  . Meperidine Hcl Nausea And Vomiting    Medications: Prior to Admission medications   Medication Sig Start Date End Date Taking? Authorizing Provider  albuterol (PROVENTIL HFA) 108 (90 BASE) MCG/ACT inhaler Inhale 2 puffs into the lungs every 6 (six) hours as needed for wheezing or shortness of breath.   Yes Historical Provider, MD  ALPRAZolam Duanne Moron) 1 MG tablet Take 1 mg by mouth 4 (four) times daily  as needed for anxiety.   Yes Historical Provider, MD  atorvastatin (LIPITOR) 40 MG tablet Take 40 mg by mouth at bedtime.    Yes Historical Provider, MD  midodrine (PROAMATINE) 5 MG tablet Take 1 tablet (5 mg total) by mouth 3 (three) times daily with meals. 01/07/15  Yes Sinda Du, MD  nitroGLYCERIN (NITROSTAT) 0.4 MG SL tablet Place 0.4 mg under the tongue every 5 (five) minutes as needed for chest pain.    Yes Historical Provider, MD  pantoprazole (PROTONIX) 40 MG tablet Take 40 mg by mouth daily.     Yes Historical Provider, MD  valproic acid (DEPAKENE) 250 MG capsule Take 250 mg by mouth 3 (three) times daily.   Yes Historical Provider, MD    Scheduled Meds: . Chlorhexidine Gluconate Cloth  6 each Topical Q0600  . enoxaparin (LOVENOX) injection  40 mg Subcutaneous Q24H  . famotidine (PEPCID) IV  20 mg Intravenous Q12H  . [START ON 01/05/2016] Influenza vac split quadrivalent PF  0.5 mL Intramuscular Tomorrow-1000  . levETIRAcetam  1,000 mg Intravenous Q12H  . mupirocin ointment  1 application Nasal BID  . piperacillin-tazobactam (ZOSYN)  IV  3.375 g Intravenous Q8H  . vancomycin  1,000 mg Intravenous Q12H   Continuous Infusions: . dextrose 5 % and 0.9% NaCl 100 mL/hr at 01/04/16 1700   PRN Meds:.acetaminophen, LORazepam     Results for orders placed or performed during the hospital encounter of 01/03/16 (from the past 48 hour(s))  Ethanol     Status: None   Collection Time: 01/03/16  8:15 PM  Result Value Ref Range   Alcohol, Ethyl (B) <5 <5 mg/dL    Comment:        LOWEST DETECTABLE LIMIT FOR SERUM ALCOHOL IS 5 mg/dL FOR MEDICAL PURPOSES ONLY   Protime-INR     Status: None   Collection Time: 01/03/16  8:15 PM  Result Value Ref Range   Prothrombin Time 12.5 11.4 - 15.2 seconds   INR 0.93   APTT     Status: None   Collection Time: 01/03/16  8:15 PM  Result Value Ref Range   aPTT 28 24 - 36 seconds  CBC     Status: Abnormal   Collection Time: 01/03/16  8:15 PM    Result Value Ref Range   WBC 12.5 (H) 4.0 - 10.5 K/uL   RBC 5.23 4.22 - 5.81 MIL/uL   Hemoglobin 17.0 13.0 - 17.0 g/dL   HCT 50.8 39.0 - 52.0 %   MCV 97.1 78.0 - 100.0 fL   MCH 32.5 26.0 - 34.0 pg   MCHC 33.5 30.0 - 36.0 g/dL   RDW 14.6 11.5 - 15.5 %   Platelets 244 150 - 400 K/uL  Differential     Status: Abnormal   Collection Time: 01/03/16  8:15 PM  Result Value Ref Range   Neutrophils Relative % 82 %   Neutro Abs 10.3 (H) 1.7 - 7.7 K/uL   Lymphocytes Relative 12 %  Lymphs Abs 1.5 0.7 - 4.0 K/uL   Monocytes Relative 6 %   Monocytes Absolute 0.7 0.1 - 1.0 K/uL   Eosinophils Relative 0 %   Eosinophils Absolute 0.0 0.0 - 0.7 K/uL   Basophils Relative 0 %   Basophils Absolute 0.0 0.0 - 0.1 K/uL  Comprehensive metabolic panel     Status: Abnormal   Collection Time: 01/03/16  8:15 PM  Result Value Ref Range   Sodium 134 (L) 135 - 145 mmol/L   Potassium 4.6 3.5 - 5.1 mmol/L   Chloride 96 (L) 101 - 111 mmol/L   CO2 26 22 - 32 mmol/L   Glucose, Bld 105 (H) 65 - 99 mg/dL   BUN 12 6 - 20 mg/dL   Creatinine, Ser 0.97 0.61 - 1.24 mg/dL   Calcium 9.5 8.9 - 10.3 mg/dL   Total Protein 8.0 6.5 - 8.1 g/dL   Albumin 4.2 3.5 - 5.0 g/dL   AST 25 15 - 41 U/L   ALT 10 (L) 17 - 63 U/L   Alkaline Phosphatase 102 38 - 126 U/L   Total Bilirubin 1.1 0.3 - 1.2 mg/dL   GFR calc non Af Amer >60 >60 mL/min   GFR calc Af Amer >60 >60 mL/min    Comment: (NOTE) The eGFR has been calculated using the CKD EPI equation. This calculation has not been validated in all clinical situations. eGFR's persistently <60 mL/min signify possible Chronic Kidney Disease.    Anion gap 12 5 - 15  Valproic acid level     Status: None   Collection Time: 01/03/16  8:15 PM  Result Value Ref Range   Valproic Acid Lvl 56 50.0 - 100.0 ug/mL  CBG monitoring, ED     Status: Abnormal   Collection Time: 01/03/16  8:19 PM  Result Value Ref Range   Glucose-Capillary 103 (H) 65 - 99 mg/dL  I-Stat Chem 8, ED  (not at [facility],  [id number] [facility])     Status: Abnormal   Collection Time: 01/03/16 11:56 PM  Result Value Ref Range   Sodium 136 135 - 145 mmol/L   Potassium 5.0 3.5 - 5.1 mmol/L   Chloride 101 101 - 111 mmol/L   BUN 13 6 - 20 mg/dL   Creatinine, Ser 0.80 0.61 - 1.24 mg/dL   Glucose, Bld 91 65 - 99 mg/dL   Calcium, Ion 1.06 (L) 1.15 - 1.40 mmol/L   TCO2 25 0 - 100 mmol/L   Hemoglobin 17.0 13.0 - 17.0 g/dL   HCT 50.0 39.0 - 52.0 %  I-stat troponin, ED (not at [facility], [facility])     Status: None   Collection Time: 01/03/16 11:56 PM  Result Value Ref Range   Troponin i, poc 0.01 0.00 - 0.08 ng/mL   Comment 3            Comment: Due to the release kinetics of cTnI, a negative result within the first hours of the onset of symptoms does not rule out myocardial infarction with certainty. If myocardial infarction is still suspected, repeat the test at appropriate intervals.   Culture, blood (routine x 2)     Status: None (Preliminary result)   Collection Time: 01/04/16  1:50 AM  Result Value Ref Range   Specimen Description BLOOD LEFT FOREARM    Special Requests BOTTLES DRAWN AEROBIC AND ANAEROBIC 10CC EACH    Culture PENDING    Report Status PENDING   Culture, blood (routine x 2)     Status: None (Preliminary result)  Collection Time: 01/04/16  1:58 AM  Result Value Ref Range   Specimen Description BLOOD LEFT ANTECUBITAL    Special Requests BOTTLES DRAWN AEROBIC AND ANAEROBIC 10CC EACH    Culture PENDING    Report Status PENDING   MRSA PCR Screening     Status: Abnormal   Collection Time: 01/04/16  2:04 AM  Result Value Ref Range   MRSA by PCR POSITIVE (A) NEGATIVE    Comment:        The GeneXpert MRSA Assay (FDA approved for NASAL specimens only), is one component of a comprehensive MRSA colonization surveillance program. It is not intended to diagnose MRSA infection nor to guide or monitor treatment for MRSA infections. RESULT CALLED TO, READ BACK BY AND VERIFIED WITH: Inocencio Homes AT 0401 ON [id number] BY  FORSYTH K   CBC WITH DIFFERENTIAL     Status: Abnormal   Collection Time: 01/04/16  5:22 AM  Result Value Ref Range   WBC 11.8 (H) 4.0 - 10.5 K/uL   RBC 4.87 4.22 - 5.81 MIL/uL   Hemoglobin 15.5 13.0 - 17.0 g/dL   HCT 47.1 39.0 - 52.0 %   MCV 96.7 78.0 - 100.0 fL   MCH 31.8 26.0 - 34.0 pg   MCHC 32.9 30.0 - 36.0 g/dL   RDW 14.5 11.5 - 15.5 %   Platelets 243 150 - 400 K/uL   Neutrophils Relative % 65 %   Neutro Abs 7.6 1.7 - 7.7 K/uL   Lymphocytes Relative 26 %   Lymphs Abs 3.1 0.7 - 4.0 K/uL   Monocytes Relative 9 %   Monocytes Absolute 1.1 (H) 0.1 - 1.0 K/uL   Eosinophils Relative 0 %   Eosinophils Absolute 0.0 0.0 - 0.7 K/uL   Basophils Relative 0 %   Basophils Absolute 0.0 0.0 - 0.1 K/uL  Comprehensive metabolic panel     Status: Abnormal   Collection Time: 01/04/16  5:22 AM  Result Value Ref Range   Sodium 135 135 - 145 mmol/L   Potassium 3.9 3.5 - 5.1 mmol/L    Comment: DELTA CHECK NOTED   Chloride 99 (L) 101 - 111 mmol/L   CO2 27 22 - 32 mmol/L   Glucose, Bld 104 (H) 65 - 99 mg/dL   BUN 13 6 - 20 mg/dL   Creatinine, Ser 0.91 0.61 - 1.24 mg/dL   Calcium 8.8 (L) 8.9 - 10.3 mg/dL   Total Protein 6.9 6.5 - 8.1 g/dL   Albumin 3.6 3.5 - 5.0 g/dL   AST 28 15 - 41 U/L   ALT 9 (L) 17 - 63 U/L   Alkaline Phosphatase 81 38 - 126 U/L   Total Bilirubin 1.2 0.3 - 1.2 mg/dL   GFR calc non Af Amer >60 >60 mL/min   GFR calc Af Amer >60 >60 mL/min    Comment: (NOTE) The eGFR has been calculated using the CKD EPI equation. This calculation has not been validated in all clinical situations. eGFR's persistently <60 mL/min signify possible Chronic Kidney Disease.    Anion gap 9 5 - 15  Urinalysis, Routine w reflex microscopic     Status: Abnormal   Collection Time: 01/04/16  2:59 PM  Result Value Ref Range   Color, Urine AMBER (A) YELLOW    Comment: BIOCHEMICALS MAY BE AFFECTED BY COLOR   APPearance HAZY (A) CLEAR   Specific Gravity, Urine >1.030 (H) 1.005 - 1.030   pH 6.0  5.0 - 8.0   Glucose, UA NEGATIVE NEGATIVE mg/dL  Hgb urine dipstick NEGATIVE NEGATIVE   Bilirubin Urine SMALL (A) NEGATIVE   Ketones, ur 40 (A) NEGATIVE mg/dL   Protein, ur NEGATIVE NEGATIVE mg/dL   Nitrite NEGATIVE NEGATIVE   Leukocytes, UA NEGATIVE NEGATIVE    Comment: Microscopic not done on urines with negative protein, blood, leukocytes, nitrite, or glucose < 500 mg/dL.    Studies/Results:   BRAIN MRI Motion and susceptibility artifact limits diagnostic accuracy. Small or subtle lesions could be overlooked.  Brain: No evidence for acute infarction, hemorrhage, mass lesion, or extra-axial fluid. Generalized atrophy. Chronic microvascular ischemic change. Large remote RIGHT MCA territory infarct, affecting the frontal, parietal, and temporal lobes as well as the insula and basal ganglia. Small chronic LEFT cerebellar infarcts. Hydrocephalus ex vacuo.  Vascular: Flow voids are maintained. Chronic hemorrhage accompanies the old RIGHT-sided infarct, with hemosiderin standing of the gyri as well as deep within the area of encephalomalacia. This localized superficial siderosis could contribute to focal or generalized seizures.  Skull and upper cervical spine: Normal marrow signal.  Sinuses/Orbits: Chronically inspissated secretions and atelectasis of the LEFT maxillary sinus. Mild mucosal thickening elsewhere.  Other: None.  IMPRESSION: Markedly motion degraded study demonstrating no acute intracranial findings. Specifically, no acute RIGHT hemisphere infarct or gyriform restricted diffusion.  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         The brain MRI scan is reviewed in person. There is a large area of encephalomalacia involving the right parietal temporal area. This is associated with marked white matter increased signal on FLAIR and T2. Nothing acute is seen on DWI. There is evidence of  petechial hemorrhage/chronic hemorrhage on SWI involving this area. There is moderate confluent periventricular  leukoencephalopathy.      Brenn Deziel A. Merlene Laughter, M.D.  Diplomate, Tax adviser of Psychiatry and Neurology ( Neurology). 01/04/2016, 7:24 PM

## 2016-01-04 NOTE — Progress Notes (Signed)
Subjective: He was admitted last night with what seems to have been a seizure and post ictal state. He is arousable but confused.  Objective: Vital signs in last 24 hours: Temp:  [98.5 F (36.9 C)-100.7 F (38.2 C)] 100.4 F (38 C) (12/28 0726) Pulse Rate:  [50-91] 50 (12/28 0726) Resp:  [14-29] 14 (12/28 0726) BP: (109-166)/(64-93) 127/78 (12/28 0100) SpO2:  [92 %-97 %] 97 % (12/28 0726) Weight:  [77.1 kg (169 lb 15.6 oz)-77.1 kg (170 lb)] 77.1 kg (169 lb 15.6 oz) (12/28 0200) Weight change:  Last BM Date:  (unknown)  Intake/Output from previous day: No intake/output data recorded.  PHYSICAL EXAM General appearance: Sleepy, arousable, confused Resp: rhonchi bilaterally Cardio: regular rate and rhythm, S1, S2 normal, no murmur, click, rub or gallop GI: soft, non-tender; bowel sounds normal; no masses,  no organomegaly Extremities: extremities normal, atraumatic, no cyanosis or edema Mucous membranes are dry. Pupils react. Skin warm and dry  Lab Results:  Results for orders placed or performed during the hospital encounter of 01/03/16 (from the past 48 hour(s))  Ethanol     Status: None   Collection Time: 01/03/16  8:15 PM  Result Value Ref Range   Alcohol, Ethyl (B) <5 <5 mg/dL    Comment:        LOWEST DETECTABLE LIMIT FOR SERUM ALCOHOL IS 5 mg/dL FOR MEDICAL PURPOSES ONLY   Protime-INR     Status: None   Collection Time: 01/03/16  8:15 PM  Result Value Ref Range   Prothrombin Time 12.5 11.4 - 15.2 seconds   INR 0.93   APTT     Status: None   Collection Time: 01/03/16  8:15 PM  Result Value Ref Range   aPTT 28 24 - 36 seconds  CBC     Status: Abnormal   Collection Time: 01/03/16  8:15 PM  Result Value Ref Range   WBC 12.5 (H) 4.0 - 10.5 K/uL   RBC 5.23 4.22 - 5.81 MIL/uL   Hemoglobin 17.0 13.0 - 17.0 g/dL   HCT 50.8 39.0 - 52.0 %   MCV 97.1 78.0 - 100.0 fL   MCH 32.5 26.0 - 34.0 pg   MCHC 33.5 30.0 - 36.0 g/dL   RDW 14.6 11.5 - 15.5 %   Platelets 244 150 -  400 K/uL  Differential     Status: Abnormal   Collection Time: 01/03/16  8:15 PM  Result Value Ref Range   Neutrophils Relative % 82 %   Neutro Abs 10.3 (H) 1.7 - 7.7 K/uL   Lymphocytes Relative 12 %   Lymphs Abs 1.5 0.7 - 4.0 K/uL   Monocytes Relative 6 %   Monocytes Absolute 0.7 0.1 - 1.0 K/uL   Eosinophils Relative 0 %   Eosinophils Absolute 0.0 0.0 - 0.7 K/uL   Basophils Relative 0 %   Basophils Absolute 0.0 0.0 - 0.1 K/uL  Comprehensive metabolic panel     Status: Abnormal   Collection Time: 01/03/16  8:15 PM  Result Value Ref Range   Sodium 134 (Sweeney) 135 - 145 mmol/Sweeney   Potassium 4.6 3.5 - 5.1 mmol/Sweeney   Chloride 96 (Sweeney) 101 - 111 mmol/Sweeney   CO2 26 22 - 32 mmol/Sweeney   Glucose, Bld 105 (H) 65 - 99 mg/dL   BUN 12 6 - 20 mg/dL   Creatinine, Ser 0.97 0.61 - 1.24 mg/dL   Calcium 9.5 8.9 - 10.3 mg/dL   Total Protein 8.0 6.5 - 8.1 g/dL   Albumin 4.2  3.5 - 5.0 g/dL   AST 25 15 - 41 U/Sweeney   ALT 10 (Sweeney) 17 - 63 U/Sweeney   Alkaline Phosphatase 102 38 - 126 U/Sweeney   Total Bilirubin 1.1 0.3 - 1.2 mg/dL   GFR calc non Af Amer >60 >60 mL/min   GFR calc Af Amer >60 >60 mL/min    Comment: (NOTE) The eGFR has been calculated using the CKD EPI equation. This calculation has not been validated in all clinical situations. eGFR's persistently <60 mL/min signify possible Chronic Kidney Disease.    Anion gap 12 5 - 15  Valproic acid level     Status: None   Collection Time: 01/03/16  8:15 PM  Result Value Ref Range   Valproic Acid Lvl 56 50.0 - 100.0 ug/mL  CBG monitoring, ED     Status: Abnormal   Collection Time: 01/03/16  8:19 PM  Result Value Ref Range   Glucose-Capillary 103 (H) 65 - 99 mg/dL  I-Stat Chem 8, ED  (not at [facility], [facility])     Status: Abnormal   Collection Time: 01/03/16 11:56 PM  Result Value Ref Range   Sodium 136 135 - 145 mmol/Sweeney   Potassium 5.0 3.5 - 5.1 mmol/Sweeney   Chloride 101 101 - 111 mmol/Sweeney   BUN 13 6 - 20 mg/dL   Creatinine, Ser 0.80 0.61 - 1.24 mg/dL   Glucose, Bld 91 65 - 99  mg/dL   Calcium, Ion 1.06 (Sweeney) 1.15 - 1.40 mmol/Sweeney   TCO2 25 0 - 100 mmol/Sweeney   Hemoglobin 17.0 13.0 - 17.0 g/dL   HCT 50.0 39.0 - 52.0 %  I-stat troponin, ED (not at [facility], [facility])     Status: None   Collection Time: 01/03/16 11:56 PM  Result Value Ref Range   Troponin i, poc 0.01 0.00 - 0.08 ng/mL   Comment 3            Comment: Due to the release kinetics of cTnI, a negative result within the first hours of the onset of symptoms does not rule out myocardial infarction with certainty. If myocardial infarction is still suspected, repeat the test at appropriate intervals.   Culture, blood (routine x 2)     Status: None (Preliminary result)   Collection Time: 01/04/16  1:50 AM  Result Value Ref Range   Specimen Description BLOOD LEFT FOREARM    Special Requests BOTTLES DRAWN AEROBIC AND ANAEROBIC 10CC EACH    Culture PENDING    Report Status PENDING   Culture, blood (routine x 2)     Status: None (Preliminary result)   Collection Time: 01/04/16  1:58 AM  Result Value Ref Range   Specimen Description BLOOD LEFT ANTECUBITAL    Special Requests BOTTLES DRAWN AEROBIC AND ANAEROBIC 10CC EACH    Culture PENDING    Report Status PENDING   MRSA PCR Screening     Status: Abnormal   Collection Time: 01/04/16  2:04 AM  Result Value Ref Range   MRSA by PCR POSITIVE (A) NEGATIVE    Comment:        The GeneXpert MRSA Assay (FDA approved for NASAL specimens only), is one component of a comprehensive MRSA colonization surveillance program. It is not intended to diagnose MRSA infection nor to guide or monitor treatment for MRSA infections. RESULT CALLED TO, READ BACK BY AND VERIFIED WITH: Inocencio Homes AT 0401 ON [id number] BY FORSYTH K   CBC WITH DIFFERENTIAL     Status: Abnormal   Collection Time: 01/04/16  5:22 AM  Result Value Ref Range   WBC 11.8 (H) 4.0 - 10.5 K/uL   RBC 4.87 4.22 - 5.81 MIL/uL   Hemoglobin 15.5 13.0 - 17.0 g/dL   HCT 47.1 39.0 - 52.0 %   MCV 96.7 78.0 - 100.0 fL   MCH 31.8  26.0 - 34.0 pg   MCHC 32.9 30.0 - 36.0 g/dL   RDW 14.5 11.5 - 15.5 %   Platelets 243 150 - 400 K/uL   Neutrophils Relative % 65 %   Neutro Abs 7.6 1.7 - 7.7 K/uL   Lymphocytes Relative 26 %   Lymphs Abs 3.1 0.7 - 4.0 K/uL   Monocytes Relative 9 %   Monocytes Absolute 1.1 (H) 0.1 - 1.0 K/uL   Eosinophils Relative 0 %   Eosinophils Absolute 0.0 0.0 - 0.7 K/uL   Basophils Relative 0 %   Basophils Absolute 0.0 0.0 - 0.1 K/uL  Comprehensive metabolic panel     Status: Abnormal   Collection Time: 01/04/16  5:22 AM  Result Value Ref Range   Sodium 135 135 - 145 mmol/Sweeney   Potassium 3.9 3.5 - 5.1 mmol/Sweeney    Comment: DELTA CHECK NOTED   Chloride 99 (Sweeney) 101 - 111 mmol/Sweeney   CO2 27 22 - 32 mmol/Sweeney   Glucose, Bld 104 (H) 65 - 99 mg/dL   BUN 13 6 - 20 mg/dL   Creatinine, Ser 0.91 0.61 - 1.24 mg/dL   Calcium 8.8 (Sweeney) 8.9 - 10.3 mg/dL   Total Protein 6.9 6.5 - 8.1 g/dL   Albumin 3.6 3.5 - 5.0 g/dL   AST 28 15 - 41 U/Sweeney   ALT 9 (Sweeney) 17 - 63 U/Sweeney   Alkaline Phosphatase 81 38 - 126 U/Sweeney   Total Bilirubin 1.2 0.3 - 1.2 mg/dL   GFR calc non Af Amer >60 >60 mL/min   GFR calc Af Amer >60 >60 mL/min    Comment: (NOTE) The eGFR has been calculated using the CKD EPI equation. This calculation has not been validated in all clinical situations. eGFR's persistently <60 mL/min signify possible Chronic Kidney Disease.    Anion gap 9 5 - 15    ABGS  Recent Labs  01/03/16 2356  TCO2 25   CULTURES Recent Results (from the past 240 hour(s))  Culture, blood (routine x 2)     Status: None (Preliminary result)   Collection Time: 01/04/16  1:50 AM  Result Value Ref Range Status   Specimen Description BLOOD LEFT FOREARM  Final   Special Requests BOTTLES DRAWN AEROBIC AND ANAEROBIC 10CC EACH  Final   Culture PENDING  Incomplete   Report Status PENDING  Incomplete  Culture, blood (routine x 2)     Status: None (Preliminary result)   Collection Time: 01/04/16  1:58 AM  Result Value Ref Range Status    Specimen Description BLOOD LEFT ANTECUBITAL  Final   Special Requests BOTTLES DRAWN AEROBIC AND ANAEROBIC 10CC EACH  Final   Culture PENDING  Incomplete   Report Status PENDING  Incomplete  MRSA PCR Screening     Status: Abnormal   Collection Time: 01/04/16  2:04 AM  Result Value Ref Range Status   MRSA by PCR POSITIVE (A) NEGATIVE Final    Comment:        The GeneXpert MRSA Assay (FDA approved for NASAL specimens only), is one component of a comprehensive MRSA colonization surveillance program. It is not intended to diagnose MRSA infection nor to guide or monitor treatment for  MRSA infections. RESULT CALLED TO, READ BACK BY AND VERIFIED WITH: Inocencio Homes AT 0401 ON [id number] BY Mikel Cella K    Studies/Results: Dg Chest Port 1 View  Result Date: 01/03/2016 CLINICAL DATA:  Patient found on floor.  Nonverbal and confused. EXAM: PORTABLE CHEST 1 VIEW COMPARISON:  01/10/2015 CXR FINDINGS: Stable borderline cardiomegaly with aortic atherosclerosis. Slightly low lung volumes without pneumonic consolidation or CHF. No effusion or pneumothorax. No suspicious osseous abnormality. IMPRESSION: Stable borderline cardiomegaly with aortic atherosclerosis. No acute pulmonary disease. Electronically Signed   By: Ashley Royalty M.D.   On: 01/03/2016 21:33   Ct Head Code Stroke Wo Contrast`  Result Date: 01/03/2016 CLINICAL DATA:  Code stroke. Found down by son, nonverbal and confused. History of stroke, hypertension, seizures. EXAM: CT HEAD WITHOUT CONTRAST TECHNIQUE: Contiguous axial images were obtained from the base of the skull through the vertex without intravenous contrast. COMPARISON:  MRI of the head January 11, 2015 and CT HEAD January 10, 2015 FINDINGS: BRAIN: The ventricles and sulci are normal for age. No intraparenchymal hemorrhage, mass effect nor midline shift. Old small LEFT cerebellar infarcts. RIGHT frontotemporal parietal encephalomalacia involving the RIGHT basal ganglia. Mild ex vacuo dilatation  RIGHT lateral ventricle, no hydrocephalus. Patchy white matter changes compatible with mild chronic small vessel ischemic disease. No acute large vascular territory infarcts. No abnormal extra-axial fluid collections. Basal cisterns are patent. VASCULAR: Moderate calcific atherosclerosis of the carotid siphons. SKULL: No skull fracture. No significant scalp soft tissue swelling. SINUSES/ORBITS: Mild maxillary sinus mucosal thickening. The included ocular globes and orbital contents are non-suspicious. OTHER: None. ASPECTS [facility] Stroke Program Early CT Score) - Ganglionic level infarction (caudate, lentiform nuclei, internal capsule, insula, M1-M3 cortex): 7 - Supraganglionic infarction (M4-M6 cortex): 3 Total score (0-10 with 10 being normal): 10 IMPRESSION: 1. No acute intracranial process. Stable examination including old large RIGHT MCA territory infarct and small LEFT cerebellar infarcts. 2. ASPECTS is 10. Critical Value/emergent results were called by telephone at the time of interpretation on 01/03/2016 at 8:45 pm to Dr. Fredia Sorrow , who verbally acknowledged these results. Electronically Signed   By: Elon Alas M.D.   On: 01/03/2016 20:45    Medications:  Prior to Admission:  Prescriptions Prior to Admission  Medication Sig Dispense Refill Last Dose  . albuterol (PROVENTIL HFA) 108 (90 BASE) MCG/ACT inhaler Inhale 2 puffs into the lungs every 6 (six) hours as needed for wheezing or shortness of breath.   Taking  . albuterol (PROVENTIL) (2.5 MG/3ML) 0.083% nebulizer solution Take 3 mLs (2.5 mg total) by nebulization every 2 (two) hours as needed for wheezing or shortness of breath. (Patient not taking: Reported on 05/01/2015) 75 mL 12 Not Taking  . alendronate (FOSAMAX) 10 MG tablet Take 4 tablets (40 mg total) by mouth every 7 (seven) days. Take with a full glass of water on an empty stomach. (Patient not taking: Reported on 04/17/2015)   Not Taking  . ALPRAZolam (XANAX) 1 MG tablet  Take 1 tablet (1 mg total) by mouth 3 (three) times daily as needed. For anxiety (Patient not taking: Reported on 04/17/2015) 30 tablet 0 Not Taking  . amLODipine (NORVASC) 5 MG tablet Take 5 mg by mouth daily.   Taking  . aspirin EC 81 MG tablet Take 1 tablet (81 mg total) by mouth daily. 30 tablet 2 Taking  . atorvastatin (LIPITOR) 40 MG tablet Take 40 mg by mouth at bedtime.    Taking  . budesonide-formoterol (SYMBICORT) 160-4.5 MCG/ACT inhaler Inhale  2 puffs into the lungs 2 (two) times daily. (Patient not taking: Reported on 03/01/2015) 1 Inhaler 12 Not Taking  . Cholecalciferol (VITAMIN D) 2000 units CAPS Take 1 capsule by mouth daily. Reported on 05/01/2015   Not Taking  . fluticasone furoate-vilanterol (BREO ELLIPTA) 100-25 MCG/INH AEPB Inhale 1 puff into the lungs daily. Reported on 05/01/2015   Taking  . levETIRAcetam (KEPPRA) 1000 MG tablet Take 1,000 mg by mouth 3 (three) times daily.   Taking  . levofloxacin (LEVAQUIN) 750 MG tablet Take 1 tablet (750 mg total) by mouth daily. (Patient not taking: Reported on 03/01/2015) 10 tablet 0 Not Taking  . midodrine (PROAMATINE) 5 MG tablet Take 1 tablet (5 mg total) by mouth 3 (three) times daily with meals.   Taking  . nitroGLYCERIN (NITROSTAT) 0.4 MG SL tablet Place 0.4 mg under the tongue every 5 (five) minutes as needed. Reported on 05/01/2015   Taking  . oxyCODONE (OXY IR/ROXICODONE) 5 MG immediate release tablet Take 1 tablet (5 mg total) by mouth every 4 (four) hours as needed for moderate pain. 30 tablet 0 Taking  . pantoprazole (PROTONIX) 40 MG tablet Take 40 mg by mouth daily.     Taking  . potassium chloride SA (K-DUR,KLOR-CON) 20 MEQ tablet Take 20 mEq by mouth 3 (three) times daily.    Taking  . predniSONE (DELTASONE) 20 MG tablet Take 2 tablets (40 mg total) by mouth daily with breakfast. (Patient not taking: Reported on 03/01/2015)   Not Taking  . tiotropium (SPIRIVA) 18 MCG inhalation capsule Place 1 capsule (18 mcg total) into inhaler  and inhale daily. 30 capsule 12 Taking  . Valproic Acid (DEPAKENE) 250 MG/5ML SYRP syrup Take 250 mg by mouth 2 (two) times daily.    Taking   Scheduled: . Chlorhexidine Gluconate Cloth  6 each Topical Q0600  . enoxaparin (LOVENOX) injection  40 mg Subcutaneous Q24H  . famotidine (PEPCID) IV  20 mg Intravenous Q12H  . [START ON 01/05/2016] Influenza vac split quadrivalent PF  0.5 mL Intramuscular Tomorrow-1000  . levETIRAcetam  1,000 mg Intravenous Q12H  . mupirocin ointment  1 application Nasal BID  . sodium chloride  1,000 mL Intravenous Once   Continuous: . dextrose 5 % and 0.9% NaCl 100 mL/hr at 01/04/16 0149   DGL:OVFIEPPIRJJOA, LORazepam  Assesment:He was admitted after a seizure and in a postictal state. He had a severe stroke several years ago. He has hemiparesis still from that although he is able to get around. He is known to have coronary disease at baseline. He has had severe problems with anxiety and depression requiring psychiatric help in the past but that seems better now. Principal Problem:   Seizure disorder [facility]) Active Problems:   Hyperlipidemia   ANXIETY DEPRESSION   Essential hypertension   Coronary atherosclerosis   Stroke [facility])   GERD    Plan: Continue Keppra. Continue other treatments. Request neurology consultation.    LOS: 1 day   Jeffrey Sweeney 01/04/2016, 8:04 AM

## 2016-01-04 NOTE — Progress Notes (Signed)
Pharmacy Antibiotic Note  CARNELL CANTERBERRY is a 70 y.o. male admitted on 01/03/2016 with fever workup.  Pharmacy has been consulted for Vancomycin and zosyn dosing.  Plan: Vancomycin 1000mg  IV every 12 hours.  Goal trough 15-20 mcg/mL.  Zosyn 3.375gm IV q8h EID, infuse over 4 hours F/U cultures and clinical progress Monitor V/S and labs Vancomycin tr levels as indicated  Height: 5\' 7"  (170.2 cm) Weight: 169 lb 15.6 oz (77.1 kg) IBW/kg (Calculated) : 66.1  Temp (24hrs), Avg:100 F (37.8 C), Min:98.5 F (36.9 C), Max:100.7 F (38.2 C)   Recent Labs Lab 01/03/16 2015 01/03/16 2356 01/04/16 0522  WBC 12.5*  --  11.8*  CREATININE 0.97 0.80 0.91    Estimated Creatinine Clearance: 70.6 mL/min (by C-G formula based on SCr of 0.91 mg/dL).    Allergies  Allergen Reactions  . Meperidine Hcl Nausea And Vomiting    Antimicrobials this admission: Vancomycin 12/28 >>  Zosyn 12/28 >>   Dose adjustments this admission: n/a  Microbiology results: 12/28 BCx: pending 12/28 MRSA PCR: +  Thank you for allowing pharmacy to be a part of this patient's care.  Isac Sarna, BS Pharm D, [place] Clinical Pharmacist Pager [phone] 01/04/2016 8:26 AM

## 2016-01-04 NOTE — Progress Notes (Signed)
ANTIBIOTIC CONSULT NOTE-Preliminary  Pharmacy Consult for Vancomycin and Zosyn Indication: fever  Allergies  Allergen Reactions  . Meperidine Hcl Nausea And Vomiting    Patient Measurements: Height: 5\' 7"  (170.2 cm) Weight: 169 lb 15.6 oz (77.1 kg) IBW/kg (Calculated) : 66.1  Vital Signs: Temp: 100.3 F (37.9 C) (12/28 0200) Temp Source: Axillary (12/28 0200) BP: 127/78 (12/28 0100) Pulse Rate: 65 (12/27 2245)  Labs:  Recent Labs  01/03/16 2015 01/03/16 2356  WBC 12.5*  --   HGB 17.0 17.0  PLT 244  --   CREATININE 0.97 0.80    Estimated Creatinine Clearance: 80.3 mL/min (by C-G formula based on SCr of 0.8 mg/dL).  No results for input(s): VANCOTROUGH, VANCOPEAK, VANCORANDOM, GENTTROUGH, GENTPEAK, GENTRANDOM, TOBRATROUGH, TOBRAPEAK, TOBRARND, AMIKACINPEAK, AMIKACINTROU, AMIKACIN in the last 72 hours.   Microbiology: Recent Results (from the past 720 hour(s))  MRSA PCR Screening     Status: Abnormal   Collection Time: 01/04/16  2:04 AM  Result Value Ref Range Status   MRSA by PCR POSITIVE (A) NEGATIVE Final    Comment:        The GeneXpert MRSA Assay (FDA approved for NASAL specimens only), is one component of a comprehensive MRSA colonization surveillance program. It is not intended to diagnose MRSA infection nor to guide or monitor treatment for MRSA infections. RESULT CALLED TO, READ BACK BY AND VERIFIED WITH: Inocencio Homes AT 0401 ON [id number] BY FORSYTH K     Medical History: Past Medical History:  Diagnosis Date  . AF (atrial fibrillation) ([place])   . Bipolar disorder ([place])   . CAD (coronary artery disease)   . COPD (chronic obstructive pulmonary disease) ([place])   . Depression   . Headache   . Heart disease   . Hypertension   . Seizures ([place])   . Stroke [facility])    left arm weakness    Medications:   Assessment: 70 yo male seen in the ED due to altered mental status; Pt has hx of seizure dx. Broad spectrum antibiotics ordered empirically due to  fever.  Goal of Therapy:  Vancomycin troughs 15-20 mcg/ml Eradicate infection  Plan:  Preliminary review of pertinent patient information completed.  Protocol will be initiated with one-time doses of vancomycin 1000 mg IV and Zosyn 3.375 Gm IV.  Forestine Na clinical pharmacist will complete review during morning rounds to assess patient and finalize treatment regimen.  Norberto Sorenson, [facility] 01/04/2016,4:04 AM

## 2016-01-04 NOTE — Progress Notes (Signed)
Jeffrey Sweeney has been very restless, combative and disoriented all shift. Patient occasionally will mumble words, and smile when you call his name. Patient cannot not answer any questions appropriately.  Patient does not follow commands.  Ativan is only helpful temporarily.  3mg  had to be given in order for patient to remain still enough to complete MRI.  Patients HR has been 35-55.  SBP 90's  Patient safety mitts are still in place. Bed in lowest position and patient favors Left side. He remains on his left side and refuses to roll to his back.  Foley was placed due to urinary retention.   Patient had temp at 0730. Tylenol suppository given. No temp for rest of shift.   Margaret Pyle, RN

## 2016-01-05 ENCOUNTER — Other Ambulatory Visit (HOSPITAL_COMMUNITY): Payer: Self-pay

## 2016-01-05 DIAGNOSIS — R569 Unspecified convulsions: Secondary | ICD-10-CM | POA: Diagnosis not present

## 2016-01-05 DIAGNOSIS — G9340 Encephalopathy, unspecified: Secondary | ICD-10-CM | POA: Diagnosis not present

## 2016-01-05 LAB — CBC WITH DIFFERENTIAL/PLATELET
BASOS ABS: 0 10*3/uL (ref 0.0–0.1)
Basophils Relative: 0 %
Eosinophils Absolute: 0.1 10*3/uL (ref 0.0–0.7)
Eosinophils Relative: 1 %
HEMATOCRIT: 45.3 % (ref 39.0–52.0)
Hemoglobin: 14.9 g/dL (ref 13.0–17.0)
LYMPHS ABS: 2.9 10*3/uL (ref 0.7–4.0)
LYMPHS PCT: 33 %
MCH: 32.1 pg (ref 26.0–34.0)
MCHC: 32.9 g/dL (ref 30.0–36.0)
MCV: 97.6 fL (ref 78.0–100.0)
MONO ABS: 0.9 10*3/uL (ref 0.1–1.0)
MONOS PCT: 10 %
NEUTROS ABS: 4.9 10*3/uL (ref 1.7–7.7)
Neutrophils Relative %: 56 %
Platelets: 215 10*3/uL (ref 150–400)
RBC: 4.64 MIL/uL (ref 4.22–5.81)
RDW: 14.6 % (ref 11.5–15.5)
WBC: 8.8 10*3/uL (ref 4.0–10.5)

## 2016-01-05 LAB — COMPREHENSIVE METABOLIC PANEL
ALT: 9 U/L — ABNORMAL LOW (ref 17–63)
AST: 42 U/L — AB (ref 15–41)
Albumin: 3.4 g/dL — ABNORMAL LOW (ref 3.5–5.0)
Alkaline Phosphatase: 74 U/L (ref 38–126)
Anion gap: 8 (ref 5–15)
BILIRUBIN TOTAL: 1.2 mg/dL (ref 0.3–1.2)
BUN: 8 mg/dL (ref 6–20)
CO2: 29 mmol/L (ref 22–32)
CREATININE: 0.74 mg/dL (ref 0.61–1.24)
Calcium: 8.6 mg/dL — ABNORMAL LOW (ref 8.9–10.3)
Chloride: 102 mmol/L (ref 101–111)
GFR calc Af Amer: >60 mL/min (ref >60)
Glucose, Bld: 85 mg/dL (ref 65–99)
POTASSIUM: 3.5 mmol/L (ref 3.5–5.1)
Sodium: 139 mmol/L (ref 135–145)
TOTAL PROTEIN: 6.4 g/dL — AB (ref 6.5–8.1)

## 2016-01-05 MED ORDER — SODIUM CHLORIDE 0.9 % IV SOLN
1500.0000 mg | Freq: Two times a day (BID) | INTRAVENOUS | Status: DC
  Administered 2016-01-05 – 2016-01-06 (×3): 1500 mg via INTRAVENOUS
  Filled 2016-01-05 (×8): qty 15

## 2016-01-05 MED ORDER — LEVETIRACETAM IN NACL 500 MG/100ML IV SOLN
INTRAVENOUS | Status: AC
  Filled 2016-01-05: qty 100

## 2016-01-05 MED ORDER — VALPROATE SODIUM 500 MG/5ML IV SOLN
500.0000 mg | Freq: Two times a day (BID) | INTRAVENOUS | Status: DC
  Administered 2016-01-05: 500 mg via INTRAVENOUS
  Filled 2016-01-05 (×9): qty 5

## 2016-01-05 MED ORDER — VALPROATE SODIUM 500 MG/5ML IV SOLN
500.0000 mg | Freq: Three times a day (TID) | INTRAVENOUS | Status: DC
  Administered 2016-01-05 – 2016-01-07 (×5): 500 mg via INTRAVENOUS
  Filled 2016-01-05 (×11): qty 5

## 2016-01-05 NOTE — Evaluation (Signed)
Clinical/Bedside Swallow Evaluation Patient Details  Name: Jeffrey Sweeney MRN: [id number] Date of Birth: [birth year]  Today's Date: 01/05/2016 Time: SLP Start Time (ACUTE ONLY): [id number] SLP Stop Time (ACUTE ONLY): 1743 SLP Time Calculation (min) (ACUTE ONLY): 23 min  Past Medical History:  Past Medical History:  Diagnosis Date  . AF (atrial fibrillation) ([place])   . Bipolar disorder ([place])   . CAD (coronary artery disease)   . COPD (chronic obstructive pulmonary disease) ([place])   . Depression   . Headache   . Heart disease   . Hypertension   . Seizures ([place])   . Stroke [facility])    left arm weakness   Past Surgical History:  Past Surgical History:  Procedure Laterality Date  . ANKLE FRACTURE SURGERY     HPI:  The patient is 70 year old white male who has a baseline history of seizures. The patient presented on the status. He currently is on Depakote. The workup has been unrevealing for acute causes of the patient's encephalopathy. His chart reviewed indicates that he was on Keppra in January of this year. It is unclear why this medication was discontinued. His Depakote level is just in the therapeutic range. The patient's baseline is that he ambulates without assisted devices seizure in a cane or walker. He apparently converses decently. He was loaded with Keppra on admission. Chest x-ray shows:Stable borderline cardiomegaly with aortic atherosclerosis. No acute   Assessment / Plan / Recommendation Clinical Impression  Pt seen at bedside for clinical swallow evaluation. Family in room and report that they have witnessed at least three seizures in the last hour (turns his head off to the left for a few seconds and becomes confused following). Oral motor examination reveals poor dentition with obvious decay around lower teeth. Pt reports that he had 9 teeth pulled and that he uses his electric toothbrush regularly (but currently out of batteries). Tongue deviates slightly to the right upon  protrusion. Pt states that he consumes soft foods at home due to sore gums. Pt assessed with ice chips, thin water, puree, and mech soft textures. No overt signs or symptoms of aspiration with consistencies presented. Pt did initially expectorate the graham cracker due to hurting his gums. SLP softened the cracker by coating it in applesauce and pt allowed to dissolve in his mouth until soft enough to masticate. SLP observed Pt with what appeared to be seizure activity, as he did not respond to SLP question and then turned his head very far back and to his left for ~15 seconds. He then turned back to SLP and began conversing with family/SLP. RN alerted. Recommend D2/chopped and thin liquids with 100% supervision with all eating/drinking due to risk of seizure activity. Family in agreement. SLP will follow PRN.    Aspiration Risk  Mild aspiration risk    Diet Recommendation Dysphagia 2 (Fine chop);Thin liquid   Liquid Administration via: Cup;Straw Medication Administration: Whole meds with puree Supervision: Patient able to self feed;Full supervision/cueing for compensatory strategies (supervision due to risk of seizure) Compensations: Slow rate;Small sips/bites Postural Changes: Seated upright at 90 degrees;Remain upright for at least 30 minutes after po intake    Other  Recommendations Oral Care Recommendations: Oral care BID;Staff/trained caregiver to provide oral care Other Recommendations: Clarify dietary restrictions   Follow up Recommendations None      Frequency and Duration min 2x/week  1 week       Prognosis Prognosis for Safe Diet Advancement: Good Barriers to Reach Goals: Medication  Swallow Study   General Date of Onset: 01/03/16 HPI: The patient is 70 year old white male who has a baseline history of seizures. The patient presented on the status. He currently is on Depakote. The workup has been unrevealing for acute causes of the patient's encephalopathy. His chart  reviewed indicates that he was on Keppra in January of this year. It is unclear why this medication was discontinued. His Depakote level is just in the therapeutic range. The patient's baseline is that he ambulates without assisted devices seizure in a cane or walker. He apparently converses decently. He was loaded with Keppra on admission. Chest x-ray shows:Stable borderline cardiomegaly with aortic atherosclerosis. No acute Type of Study: Bedside Swallow Evaluation Previous Swallow Assessment: None on record Diet Prior to this Study: NPO (soft foods at home due to poor dentition) Temperature Spikes Noted: No Respiratory Status: Room air History of Recent Intubation: No Behavior/Cognition: Alert;Cooperative;Pleasant mood;Confused Oral Cavity Assessment: Within Functional Limits (poor dentition) Oral Care Completed by SLP: Yes Oral Cavity - Dentition: Poor condition;Missing dentition Vision: Functional for self-feeding Self-Feeding Abilities: Able to feed self;Needs set up Patient Positioning: Upright in bed Baseline Vocal Quality: Normal;Low vocal intensity Volitional Cough: Strong Volitional Swallow: Able to elicit    Oral/Motor/Sensory Function Overall Oral Motor/Sensory Function: Within functional limits (mild lingual deviation to the right)   Ice Chips Ice chips: Within functional limits Presentation: Spoon Other Comments: Cues for attention to task   Thin Liquid Thin Liquid: Within functional limits Presentation: Cup;Straw;Spoon    Nectar Thick Nectar Thick Liquid: Not tested   Honey Thick Honey Thick Liquid: Not tested   Puree Puree: Within functional limits Presentation: Spoon   Solid    Thank you,  Jeffrey Sweeney, CCC-SLP [phone]    Solid: Impaired Presentation: Spoon Oral Phase Impairments: Impaired mastication (due to poor dentition) Oral Phase Functional Implications: Prolonged oral transit;Impaired mastication        Jeffrey Sweeney 01/05/2016,5:50  PM

## 2016-01-05 NOTE — Progress Notes (Signed)
EEG tech came from [facility] to complete EEG, patient had just had hair washed as tech walked in. Patients hair was wet and EEG unable to be completed. EEG will be re attempted as schedule permits.

## 2016-01-05 NOTE — Progress Notes (Addendum)
Bath A. Merlene Laughter, MD     [email or web address]          Jeffrey Sweeney is an 70 y.o. male.   ASSESSMENT/PLAN: Acute encephalopathy with the etiology currently unclear.  The most likely scenario is that the patient had a unwitnessed seizure. Given the prolonged encephalopathy, I am concerned about ongoing nonconvulsive seizures. We will therefore restart the patient's Depakote which for some reason has not been restarted since being admitted. The dose will also be increased. He is to continue with the [place]. We will also obtain an EEG. Repeat neurological examination will be done.    UPDATE  Complex partial status epilepticus  The patient's son is at the bedside. The patient reports that since he has been at the bedside and patient has had 6 episodes of head turning towards the left associated with altered responsiveness and confusion. It appears that the nurses of also reported the patient having these spells. There seemed to be some improvement however. The nurse reports that previously he was turning constantly towards the left. He seems to be more oriented although he is having these frequent spells. We will therefore increase the dose of Keppra to 1500 mg twice a day and also increase the Depakote to 3 times a day dosing. If he continues to have episodes we may have to start a third agent preferably vimpat 100 mg twice a day.     GENERAL: Patient lays in bed and more responsive tonight.  HEENT: Supple. Atraumatic normocephalic - no oral trauma appreciated.   ABDOMEN: soft  EXTREMITIES: No edema   BACK: Normal.  SKIN: Normal by inspection.    MENTAL STATUS: The patient is more responsive today. He did have one episode while I was seeing the patient return towards the left and was unresponsive for about 10 seconds. His eyes were deviated towards the left additionally. When awake and he does speak in 3 word sentences and states that he is doing okay. He  follows commands although disoriented.  CRANIAL NERVES: Pupils are equal, round and reactive to light; extra ocular movements are full, there is no significant nystagmus; visual fields are limited but appears to be full; upper and lower facial muscles are normal in strength and symmetric, there is no flattening of the nasolabial folds; tongue is midline.  MOTOR: The patient moves both sides well without clear focality. Bulk and tone are normal throughout.  COORDINATION: There is no evidence of dysmetria or tremors. No parkinsonism.  REFLEXES: Deep tendon reflexes are symmetrical and normal.   SENSATION: He responds to pain bilaterally.        Blood pressure 93/61, pulse 65, temperature 98.4 F (36.9 C), temperature source Oral, resp. rate 14, height _0  (1.702 m), weight 171 lb 8.3 oz (77.8 kg), SpO2 93 %.  Past Medical History:  Diagnosis Date  . AF (atrial fibrillation) ([place])   . Bipolar disorder ([place])   . CAD (coronary artery disease)   . COPD (chronic obstructive pulmonary disease) ([place])   . Depression   . Headache   . Heart disease   . Hypertension   . Seizures ([place])   . Stroke [facility])    left arm weakness    Past Surgical History:  Procedure Laterality Date  . ANKLE FRACTURE SURGERY      Family History  Problem Relation Age of Onset  . Cancer Mother   . Cancer Father     Social History:  reports that he quit smoking about  1 years ago. His smoking use included Cigarettes. He has a 25.00 pack-year smoking history. He has never used smokeless tobacco. He reports that he does not drink alcohol or use drugs.  Allergies:  Allergies  Allergen Reactions  . Meperidine Hcl Nausea And Vomiting    Medications: Prior to Admission medications   Medication Sig Start Date End Date Taking? Authorizing Provider  albuterol (PROVENTIL HFA) 108 (90 BASE) MCG/ACT inhaler Inhale 2 puffs into the lungs every 6 (six) hours as needed for wheezing or shortness of breath.   Yes  Historical Provider, MD  ALPRAZolam Duanne Moron) 1 MG tablet Take 1 mg by mouth 4 (four) times daily as needed for anxiety.   Yes Historical Provider, MD  atorvastatin (LIPITOR) 40 MG tablet Take 40 mg by mouth at bedtime.    Yes Historical Provider, MD  midodrine (PROAMATINE) 5 MG tablet Take 1 tablet (5 mg total) by mouth 3 (three) times daily with meals. 01/07/15  Yes Sinda Du, MD  nitroGLYCERIN (NITROSTAT) 0.4 MG SL tablet Place 0.4 mg under the tongue every 5 (five) minutes as needed for chest pain.    Yes Historical Provider, MD  pantoprazole (PROTONIX) 40 MG tablet Take 40 mg by mouth daily.     Yes Historical Provider, MD  valproic acid (DEPAKENE) 250 MG capsule Take 250 mg by mouth 3 (three) times daily.   Yes Historical Provider, MD    Scheduled Meds: . Chlorhexidine Gluconate Cloth  6 each Topical Q0600  . enoxaparin (LOVENOX) injection  40 mg Subcutaneous Q24H  . famotidine (PEPCID) IV  20 mg Intravenous Q12H  . levETIRAcetam  1,000 mg Intravenous Q12H  . mupirocin ointment  1 application Nasal BID  . piperacillin-tazobactam (ZOSYN)  IV  3.375 g Intravenous Q8H  . valproate sodium  500 mg Intravenous Q12H  . vancomycin  1,000 mg Intravenous Q12H   Continuous Infusions: . dextrose 5 % and 0.9% NaCl 100 mL/hr at 01/05/16 0756   PRN Meds:.acetaminophen, LORazepam     Results for orders placed or performed during the hospital encounter of 01/03/16 (from the past 48 hour(s))  Ethanol     Status: None   Collection Time: 01/03/16  8:15 PM  Result Value Ref Range   Alcohol, Ethyl (B) <5 <5 mg/dL    Comment:        LOWEST DETECTABLE LIMIT FOR SERUM ALCOHOL IS 5 mg/dL FOR MEDICAL PURPOSES ONLY   Protime-INR     Status: None   Collection Time: 01/03/16  8:15 PM  Result Value Ref Range   Prothrombin Time 12.5 11.4 - 15.2 seconds   INR 0.93   APTT     Status: None   Collection Time: 01/03/16  8:15 PM  Result Value Ref Range   aPTT 28 24 - 36 seconds  CBC     Status:  Abnormal   Collection Time: 01/03/16  8:15 PM  Result Value Ref Range   WBC 12.5 (H) 4.0 - 10.5 K/uL   RBC 5.23 4.22 - 5.81 MIL/uL   Hemoglobin 17.0 13.0 - 17.0 g/dL   HCT 50.8 39.0 - 52.0 %   MCV 97.1 78.0 - 100.0 fL   MCH 32.5 26.0 - 34.0 pg   MCHC 33.5 30.0 - 36.0 g/dL   RDW 14.6 11.5 - 15.5 %   Platelets 244 150 - 400 K/uL  Differential     Status: Abnormal   Collection Time: 01/03/16  8:15 PM  Result Value Ref Range   Neutrophils Relative %  82 %   Neutro Abs 10.3 (H) 1.7 - 7.7 K/uL   Lymphocytes Relative 12 %   Lymphs Abs 1.5 0.7 - 4.0 K/uL   Monocytes Relative 6 %   Monocytes Absolute 0.7 0.1 - 1.0 K/uL   Eosinophils Relative 0 %   Eosinophils Absolute 0.0 0.0 - 0.7 K/uL   Basophils Relative 0 %   Basophils Absolute 0.0 0.0 - 0.1 K/uL  Comprehensive metabolic panel     Status: Abnormal   Collection Time: 01/03/16  8:15 PM  Result Value Ref Range   Sodium 134 (L) 135 - 145 mmol/L   Potassium 4.6 3.5 - 5.1 mmol/L   Chloride 96 (L) 101 - 111 mmol/L   CO2 26 22 - 32 mmol/L   Glucose, Bld 105 (H) 65 - 99 mg/dL   BUN 12 6 - 20 mg/dL   Creatinine, Ser 0.97 0.61 - 1.24 mg/dL   Calcium 9.5 8.9 - 10.3 mg/dL   Total Protein 8.0 6.5 - 8.1 g/dL   Albumin 4.2 3.5 - 5.0 g/dL   AST 25 15 - 41 U/L   ALT 10 (L) 17 - 63 U/L   Alkaline Phosphatase 102 38 - 126 U/L   Total Bilirubin 1.1 0.3 - 1.2 mg/dL   GFR calc non Af Amer >60 >60 mL/min   GFR calc Af Amer >60 >60 mL/min    Comment: (NOTE) The eGFR has been calculated using the CKD EPI equation. This calculation has not been validated in all clinical situations. eGFR's persistently <60 mL/min signify possible Chronic Kidney Disease.    Anion gap 12 5 - 15  Valproic acid level     Status: None   Collection Time: 01/03/16  8:15 PM  Result Value Ref Range   Valproic Acid Lvl 56 50.0 - 100.0 ug/mL  CBG monitoring, ED     Status: Abnormal   Collection Time: 01/03/16  8:19 PM  Result Value Ref Range   Glucose-Capillary 103 (H)  65 - 99 mg/dL  I-Stat Chem 8, ED  (not at [facility], [facility])     Status: Abnormal   Collection Time: 01/03/16 11:56 PM  Result Value Ref Range   Sodium 136 135 - 145 mmol/L   Potassium 5.0 3.5 - 5.1 mmol/L   Chloride 101 101 - 111 mmol/L   BUN 13 6 - 20 mg/dL   Creatinine, Ser 0.80 0.61 - 1.24 mg/dL   Glucose, Bld 91 65 - 99 mg/dL   Calcium, Ion 1.06 (L) 1.15 - 1.40 mmol/L   TCO2 25 0 - 100 mmol/L   Hemoglobin 17.0 13.0 - 17.0 g/dL   HCT 50.0 39.0 - 52.0 %  I-stat troponin, ED (not at [facility], [facility])     Status: None   Collection Time: 01/03/16 11:56 PM  Result Value Ref Range   Troponin i, poc 0.01 0.00 - 0.08 ng/mL   Comment 3            Comment: Due to the release kinetics of cTnI, a negative result within the first hours of the onset of symptoms does not rule out myocardial infarction with certainty. If myocardial infarction is still suspected, repeat the test at appropriate intervals.   Culture, blood (routine x 2)     Status: None (Preliminary result)   Collection Time: 01/04/16  1:50 AM  Result Value Ref Range   Specimen Description BLOOD LEFT FOREARM    Special Requests BOTTLES DRAWN AEROBIC AND ANAEROBIC 10CC EACH    Culture NO GROWTH  1 DAY    Report Status PENDING   Culture, blood (routine x 2)     Status: None (Preliminary result)   Collection Time: 01/04/16  1:58 AM  Result Value Ref Range   Specimen Description BLOOD LEFT ANTECUBITAL    Special Requests BOTTLES DRAWN AEROBIC AND ANAEROBIC 10CC EACH    Culture NO GROWTH 1 DAY    Report Status PENDING   MRSA PCR Screening     Status: Abnormal   Collection Time: 01/04/16  2:04 AM  Result Value Ref Range   MRSA by PCR POSITIVE (A) NEGATIVE    Comment:        The GeneXpert MRSA Assay (FDA approved for NASAL specimens only), is one component of a comprehensive MRSA colonization surveillance program. It is not intended to diagnose MRSA infection nor to guide or monitor treatment for MRSA infections. RESULT CALLED TO,  READ BACK BY AND VERIFIED WITH: Inocencio Homes AT 0401 ON [id number] BY FORSYTH K   CBC WITH DIFFERENTIAL     Status: Abnormal   Collection Time: 01/04/16  5:22 AM  Result Value Ref Range   WBC 11.8 (H) 4.0 - 10.5 K/uL   RBC 4.87 4.22 - 5.81 MIL/uL   Hemoglobin 15.5 13.0 - 17.0 g/dL   HCT 47.1 39.0 - 52.0 %   MCV 96.7 78.0 - 100.0 fL   MCH 31.8 26.0 - 34.0 pg   MCHC 32.9 30.0 - 36.0 g/dL   RDW 14.5 11.5 - 15.5 %   Platelets 243 150 - 400 K/uL   Neutrophils Relative % 65 %   Neutro Abs 7.6 1.7 - 7.7 K/uL   Lymphocytes Relative 26 %   Lymphs Abs 3.1 0.7 - 4.0 K/uL   Monocytes Relative 9 %   Monocytes Absolute 1.1 (H) 0.1 - 1.0 K/uL   Eosinophils Relative 0 %   Eosinophils Absolute 0.0 0.0 - 0.7 K/uL   Basophils Relative 0 %   Basophils Absolute 0.0 0.0 - 0.1 K/uL  Comprehensive metabolic panel     Status: Abnormal   Collection Time: 01/04/16  5:22 AM  Result Value Ref Range   Sodium 135 135 - 145 mmol/L   Potassium 3.9 3.5 - 5.1 mmol/L    Comment: DELTA CHECK NOTED   Chloride 99 (L) 101 - 111 mmol/L   CO2 27 22 - 32 mmol/L   Glucose, Bld 104 (H) 65 - 99 mg/dL   BUN 13 6 - 20 mg/dL   Creatinine, Ser 0.91 0.61 - 1.24 mg/dL   Calcium 8.8 (L) 8.9 - 10.3 mg/dL   Total Protein 6.9 6.5 - 8.1 g/dL   Albumin 3.6 3.5 - 5.0 g/dL   AST 28 15 - 41 U/L   ALT 9 (L) 17 - 63 U/L   Alkaline Phosphatase 81 38 - 126 U/L   Total Bilirubin 1.2 0.3 - 1.2 mg/dL   GFR calc non Af Amer >60 >60 mL/min   GFR calc Af Amer >60 >60 mL/min    Comment: (NOTE) The eGFR has been calculated using the CKD EPI equation. This calculation has not been validated in all clinical situations. eGFR's persistently <60 mL/min signify possible Chronic Kidney Disease.    Anion gap 9 5 - 15  Urinalysis, Routine w reflex microscopic     Status: Abnormal   Collection Time: 01/04/16  2:59 PM  Result Value Ref Range   Color, Urine AMBER (A) YELLOW    Comment: BIOCHEMICALS MAY BE AFFECTED BY COLOR   APPearance  HAZY (A) CLEAR     Specific Gravity, Urine >1.030 (H) 1.005 - 1.030   pH 6.0 5.0 - 8.0   Glucose, UA NEGATIVE NEGATIVE mg/dL   Hgb urine dipstick NEGATIVE NEGATIVE   Bilirubin Urine SMALL (A) NEGATIVE   Ketones, ur 40 (A) NEGATIVE mg/dL   Protein, ur NEGATIVE NEGATIVE mg/dL   Nitrite NEGATIVE NEGATIVE   Leukocytes, UA NEGATIVE NEGATIVE    Comment: Microscopic not done on urines with negative protein, blood, leukocytes, nitrite, or glucose < 500 mg/dL.  Urine rapid drug screen (hosp performed)     Status: Abnormal   Collection Time: 01/04/16  2:59 PM  Result Value Ref Range   Opiates NONE DETECTED NONE DETECTED   Cocaine NONE DETECTED NONE DETECTED   Benzodiazepines POSITIVE (A) NONE DETECTED   Amphetamines NONE DETECTED NONE DETECTED   Tetrahydrocannabinol NONE DETECTED NONE DETECTED   Barbiturates NONE DETECTED NONE DETECTED    Comment:        DRUG SCREEN FOR MEDICAL PURPOSES ONLY.  IF CONFIRMATION IS NEEDED FOR ANY PURPOSE, NOTIFY LAB WITHIN 5 DAYS.        LOWEST DETECTABLE LIMITS FOR URINE DRUG SCREEN Drug Class       Cutoff (ng/mL) Amphetamine      1000 Barbiturate      200 Benzodiazepine   270 Tricyclics       350 Opiates          300 Cocaine          300 THC              50   CBC with Differential/Platelet     Status: None   Collection Time: 01/05/16  5:20 AM  Result Value Ref Range   WBC 8.8 4.0 - 10.5 K/uL   RBC 4.64 4.22 - 5.81 MIL/uL   Hemoglobin 14.9 13.0 - 17.0 g/dL   HCT 45.3 39.0 - 52.0 %   MCV 97.6 78.0 - 100.0 fL   MCH 32.1 26.0 - 34.0 pg   MCHC 32.9 30.0 - 36.0 g/dL   RDW 14.6 11.5 - 15.5 %   Platelets 215 150 - 400 K/uL   Neutrophils Relative % 56 %   Neutro Abs 4.9 1.7 - 7.7 K/uL   Lymphocytes Relative 33 %   Lymphs Abs 2.9 0.7 - 4.0 K/uL   Monocytes Relative 10 %   Monocytes Absolute 0.9 0.1 - 1.0 K/uL   Eosinophils Relative 1 %   Eosinophils Absolute 0.1 0.0 - 0.7 K/uL   Basophils Relative 0 %   Basophils Absolute 0.0 0.0 - 0.1 K/uL  Comprehensive  metabolic panel     Status: Abnormal   Collection Time: 01/05/16  5:20 AM  Result Value Ref Range   Sodium 139 135 - 145 mmol/L   Potassium 3.5 3.5 - 5.1 mmol/L   Chloride 102 101 - 111 mmol/L   CO2 29 22 - 32 mmol/L   Glucose, Bld 85 65 - 99 mg/dL   BUN 8 6 - 20 mg/dL   Creatinine, Ser 0.74 0.61 - 1.24 mg/dL   Calcium 8.6 (L) 8.9 - 10.3 mg/dL   Total Protein 6.4 (L) 6.5 - 8.1 g/dL   Albumin 3.4 (L) 3.5 - 5.0 g/dL   AST 42 (H) 15 - 41 U/L   ALT 9 (L) 17 - 63 U/L   Alkaline Phosphatase 74 38 - 126 U/L   Total Bilirubin 1.2 0.3 - 1.2 mg/dL   GFR calc non Af Amer >60 >  60 mL/min   GFR calc Af Amer >60 >60 mL/min    Comment: (NOTE) The eGFR has been calculated using the CKD EPI equation. This calculation has not been validated in all clinical situations. eGFR's persistently <60 mL/min signify possible Chronic Kidney Disease.    Anion gap 8 5 - 15    Studies/Results:   BRAIN MRI Motion and susceptibility artifact limits diagnostic accuracy. Small or subtle lesions could be overlooked.  Brain: No evidence for acute infarction, hemorrhage, mass lesion, or extra-axial fluid. Generalized atrophy. Chronic microvascular ischemic change. Large remote RIGHT MCA territory infarct, affecting the frontal, parietal, and temporal lobes as well as the insula and basal ganglia. Small chronic LEFT cerebellar infarcts. Hydrocephalus ex vacuo.  Vascular: Flow voids are maintained. Chronic hemorrhage accompanies the old RIGHT-sided infarct, with hemosiderin standing of the gyri as well as deep within the area of encephalomalacia. This localized superficial siderosis could contribute to focal or generalized seizures.  Skull and upper cervical spine: Normal marrow signal.  Sinuses/Orbits: Chronically inspissated secretions and atelectasis of the LEFT maxillary sinus. Mild mucosal thickening elsewhere.  Other: None.  IMPRESSION: Markedly motion degraded study demonstrating no acute  intracranial findings. Specifically, no acute RIGHT hemisphere infarct or gyriform restricted diffusion.  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         The brain MRI scan is reviewed in person. There is a large area of encephalomalacia involving the right parietal temporal area. This is associated with marked white matter increased signal on FLAIR and T2. Nothing acute is seen on DWI. There is evidence of petechial hemorrhage/chronic hemorrhage on SWI involving this area. There is moderate confluent periventricular  leukoencephalopathy.      Chessa Barrasso A. Merlene Laughter, M.D.  Diplomate, Tax adviser of Psychiatry and Neurology ( Neurology). 01/05/2016, 5:20 PM

## 2016-01-05 NOTE — Care Management Important Message (Signed)
Important Message  Patient Details  Name: ANTRONE ZEH MRN: [id number] Date of Birth: 10-14-1945   Medicare Important Message Given:  Yes    Demoni Parmar, Chauncey Reading, RN 01/05/2016, 4:35 PM

## 2016-01-05 NOTE — Progress Notes (Signed)
Subjective: He is calm her this morning. He recognizes me this morning. He is able to talk a little bit. He had MRI yesterday and although he was still moving and has some motion artifact it does not appear that he has had an acute stroke. Working diagnosis is that he has had a seizure and postictal state was prolonged. He apparently was seen by neurology last night but note is not available yet. He is on antibiotics at this point until I can discuss with neurology and see if they feel there is any meningitis/encephalitis that might need [place] to continue antibiotics. Had low-grade fever.  Objective: Vital signs in last 24 hours: Temp:  [97.4 F (36.3 C)-98 F (36.7 C)] 98 F (36.7 C) (12/29 0400) Pulse Rate:  [36-89] 77 (12/29 0800) Resp:  [13-29] 16 (12/29 0800) BP: (89-133)/(53-86) 106/66 (12/29 0800) SpO2:  [91 %-99 %] 93 % (12/29 0800) Weight:  [77.8 kg (171 lb 8.3 oz)] 77.8 kg (171 lb 8.3 oz) (12/29 0458) Weight change: 0.689 kg (1 lb 8.3 oz) Last BM Date:  (unsure)  Intake/Output from previous day: 12/28 0701 - 12/29 0700 In: 1818.3 [I.V.:1518.3; IV Piggyback:300] Out: 1200 [Urine:1200]  PHYSICAL EXAM General appearance: More alert. Able to answer some questions Resp: rhonchi bilaterally Cardio: regular rate and rhythm, S1, S2 normal, no murmur, click, rub or gallop GI: soft, non-tender; bowel sounds normal; no masses,  no organomegaly Extremities: extremities normal, atraumatic, no cyanosis or edema Pupils react. Mucous membranes still slightly dry. Still confused but better than yesterday  Lab Results:  Results for orders placed or performed during the hospital encounter of 01/03/16 (from the past 48 hour(s))  Ethanol     Status: None   Collection Time: 01/03/16  8:15 PM  Result Value Ref Range   Alcohol, Ethyl (B) <5 <5 mg/dL    Comment:        LOWEST DETECTABLE LIMIT FOR SERUM ALCOHOL IS 5 mg/dL FOR MEDICAL PURPOSES ONLY   Protime-INR     Status: None   Collection  Time: 01/03/16  8:15 PM  Result Value Ref Range   Prothrombin Time 12.5 11.4 - 15.2 seconds   INR 0.93   APTT     Status: None   Collection Time: 01/03/16  8:15 PM  Result Value Ref Range   aPTT 28 24 - 36 seconds  CBC     Status: Abnormal   Collection Time: 01/03/16  8:15 PM  Result Value Ref Range   WBC 12.5 (H) 4.0 - 10.5 K/uL   RBC 5.23 4.22 - 5.81 MIL/uL   Hemoglobin 17.0 13.0 - 17.0 g/dL   HCT 28.9 79.1 - 50.4 %   MCV 97.1 78.0 - 100.0 fL   MCH 32.5 26.0 - 34.0 pg   MCHC 33.5 30.0 - 36.0 g/dL   RDW 13.6 43.8 - 37.7 %   Platelets 244 150 - 400 K/uL  Differential     Status: Abnormal   Collection Time: 01/03/16  8:15 PM  Result Value Ref Range   Neutrophils Relative % 82 %   Neutro Abs 10.3 (H) 1.7 - 7.7 K/uL   Lymphocytes Relative 12 %   Lymphs Abs 1.5 0.7 - 4.0 K/uL   Monocytes Relative 6 %   Monocytes Absolute 0.7 0.1 - 1.0 K/uL   Eosinophils Relative 0 %   Eosinophils Absolute 0.0 0.0 - 0.7 K/uL   Basophils Relative 0 %   Basophils Absolute 0.0 0.0 - 0.1 K/uL  Comprehensive metabolic panel  Status: Abnormal   Collection Time: 01/03/16  8:15 PM  Result Value Ref Range   Sodium 134 (L) 135 - 145 mmol/L   Potassium 4.6 3.5 - 5.1 mmol/L   Chloride 96 (L) 101 - 111 mmol/L   CO2 26 22 - 32 mmol/L   Glucose, Bld 105 (H) 65 - 99 mg/dL   BUN 12 6 - 20 mg/dL   Creatinine, Ser 0.97 0.61 - 1.24 mg/dL   Calcium 9.5 8.9 - 10.3 mg/dL   Total Protein 8.0 6.5 - 8.1 g/dL   Albumin 4.2 3.5 - 5.0 g/dL   AST 25 15 - 41 U/L   ALT 10 (L) 17 - 63 U/L   Alkaline Phosphatase 102 38 - 126 U/L   Total Bilirubin 1.1 0.3 - 1.2 mg/dL   GFR calc non Af Amer >60 >60 mL/min   GFR calc Af Amer >60 >60 mL/min    Comment: (NOTE) The eGFR has been calculated using the CKD EPI equation. This calculation has not been validated in all clinical situations. eGFR's persistently <60 mL/min signify possible Chronic Kidney Disease.    Anion gap 12 5 - 15  Valproic acid level     Status: None    Collection Time: 01/03/16  8:15 PM  Result Value Ref Range   Valproic Acid Lvl 56 50.0 - 100.0 ug/mL  CBG monitoring, ED     Status: Abnormal   Collection Time: 01/03/16  8:19 PM  Result Value Ref Range   Glucose-Capillary 103 (H) 65 - 99 mg/dL  I-Stat Chem 8, ED  (not at [facility], [facility])     Status: Abnormal   Collection Time: 01/03/16 11:56 PM  Result Value Ref Range   Sodium 136 135 - 145 mmol/L   Potassium 5.0 3.5 - 5.1 mmol/L   Chloride 101 101 - 111 mmol/L   BUN 13 6 - 20 mg/dL   Creatinine, Ser 0.80 0.61 - 1.24 mg/dL   Glucose, Bld 91 65 - 99 mg/dL   Calcium, Ion 1.06 (L) 1.15 - 1.40 mmol/L   TCO2 25 0 - 100 mmol/L   Hemoglobin 17.0 13.0 - 17.0 g/dL   HCT 50.0 39.0 - 52.0 %  I-stat troponin, ED (not at [facility], [facility])     Status: None   Collection Time: 01/03/16 11:56 PM  Result Value Ref Range   Troponin i, poc 0.01 0.00 - 0.08 ng/mL   Comment 3            Comment: Due to the release kinetics of cTnI, a negative result within the first hours of the onset of symptoms does not rule out myocardial infarction with certainty. If myocardial infarction is still suspected, repeat the test at appropriate intervals.   Culture, blood (routine x 2)     Status: None (Preliminary result)   Collection Time: 01/04/16  1:50 AM  Result Value Ref Range   Specimen Description BLOOD LEFT FOREARM    Special Requests BOTTLES DRAWN AEROBIC AND ANAEROBIC 10CC EACH    Culture PENDING    Report Status PENDING   Culture, blood (routine x 2)     Status: None (Preliminary result)   Collection Time: 01/04/16  1:58 AM  Result Value Ref Range   Specimen Description BLOOD LEFT ANTECUBITAL    Special Requests BOTTLES DRAWN AEROBIC AND ANAEROBIC 10CC EACH    Culture PENDING    Report Status PENDING   MRSA PCR Screening     Status: Abnormal   Collection Time: 01/04/16  2:04 AM  Result Value Ref Range   MRSA by PCR POSITIVE (A) NEGATIVE    Comment:        The GeneXpert MRSA Assay (FDA approved for  NASAL specimens only), is one component of a comprehensive MRSA colonization surveillance program. It is not intended to diagnose MRSA infection nor to guide or monitor treatment for MRSA infections. RESULT CALLED TO, READ BACK BY AND VERIFIED WITH: Inocencio Homes AT 0401 ON [id number] BY FORSYTH K   CBC WITH DIFFERENTIAL     Status: Abnormal   Collection Time: 01/04/16  5:22 AM  Result Value Ref Range   WBC 11.8 (H) 4.0 - 10.5 K/uL   RBC 4.87 4.22 - 5.81 MIL/uL   Hemoglobin 15.5 13.0 - 17.0 g/dL   HCT 47.1 39.0 - 52.0 %   MCV 96.7 78.0 - 100.0 fL   MCH 31.8 26.0 - 34.0 pg   MCHC 32.9 30.0 - 36.0 g/dL   RDW 14.5 11.5 - 15.5 %   Platelets 243 150 - 400 K/uL   Neutrophils Relative % 65 %   Neutro Abs 7.6 1.7 - 7.7 K/uL   Lymphocytes Relative 26 %   Lymphs Abs 3.1 0.7 - 4.0 K/uL   Monocytes Relative 9 %   Monocytes Absolute 1.1 (H) 0.1 - 1.0 K/uL   Eosinophils Relative 0 %   Eosinophils Absolute 0.0 0.0 - 0.7 K/uL   Basophils Relative 0 %   Basophils Absolute 0.0 0.0 - 0.1 K/uL  Comprehensive metabolic panel     Status: Abnormal   Collection Time: 01/04/16  5:22 AM  Result Value Ref Range   Sodium 135 135 - 145 mmol/L   Potassium 3.9 3.5 - 5.1 mmol/L    Comment: DELTA CHECK NOTED   Chloride 99 (L) 101 - 111 mmol/L   CO2 27 22 - 32 mmol/L   Glucose, Bld 104 (H) 65 - 99 mg/dL   BUN 13 6 - 20 mg/dL   Creatinine, Ser 0.91 0.61 - 1.24 mg/dL   Calcium 8.8 (L) 8.9 - 10.3 mg/dL   Total Protein 6.9 6.5 - 8.1 g/dL   Albumin 3.6 3.5 - 5.0 g/dL   AST 28 15 - 41 U/L   ALT 9 (L) 17 - 63 U/L   Alkaline Phosphatase 81 38 - 126 U/L   Total Bilirubin 1.2 0.3 - 1.2 mg/dL   GFR calc non Af Amer >60 >60 mL/min   GFR calc Af Amer >60 >60 mL/min    Comment: (NOTE) The eGFR has been calculated using the CKD EPI equation. This calculation has not been validated in all clinical situations. eGFR's persistently <60 mL/min signify possible Chronic Kidney Disease.    Anion gap 9 5 - 15  Urinalysis,  Routine w reflex microscopic     Status: Abnormal   Collection Time: 01/04/16  2:59 PM  Result Value Ref Range   Color, Urine AMBER (A) YELLOW    Comment: BIOCHEMICALS MAY BE AFFECTED BY COLOR   APPearance HAZY (A) CLEAR   Specific Gravity, Urine >1.030 (H) 1.005 - 1.030   pH 6.0 5.0 - 8.0   Glucose, UA NEGATIVE NEGATIVE mg/dL   Hgb urine dipstick NEGATIVE NEGATIVE   Bilirubin Urine SMALL (A) NEGATIVE   Ketones, ur 40 (A) NEGATIVE mg/dL   Protein, ur NEGATIVE NEGATIVE mg/dL   Nitrite NEGATIVE NEGATIVE   Leukocytes, UA NEGATIVE NEGATIVE    Comment: Microscopic not done on urines with negative protein, blood, leukocytes, nitrite, or glucose <  500 mg/dL.  Urine rapid drug screen (hosp performed)     Status: Abnormal   Collection Time: 01/04/16  2:59 PM  Result Value Ref Range   Opiates NONE DETECTED NONE DETECTED   Cocaine NONE DETECTED NONE DETECTED   Benzodiazepines POSITIVE (A) NONE DETECTED   Amphetamines NONE DETECTED NONE DETECTED   Tetrahydrocannabinol NONE DETECTED NONE DETECTED   Barbiturates NONE DETECTED NONE DETECTED    Comment:        DRUG SCREEN FOR MEDICAL PURPOSES ONLY.  IF CONFIRMATION IS NEEDED FOR ANY PURPOSE, NOTIFY LAB WITHIN 5 DAYS.        LOWEST DETECTABLE LIMITS FOR URINE DRUG SCREEN Drug Class       Cutoff (ng/mL) Amphetamine      1000 Barbiturate      200 Benzodiazepine   563 Tricyclics       875 Opiates          300 Cocaine          300 THC              50   CBC with Differential/Platelet     Status: None   Collection Time: 01/05/16  5:20 AM  Result Value Ref Range   WBC 8.8 4.0 - 10.5 K/uL   RBC 4.64 4.22 - 5.81 MIL/uL   Hemoglobin 14.9 13.0 - 17.0 g/dL   HCT 45.3 39.0 - 52.0 %   MCV 97.6 78.0 - 100.0 fL   MCH 32.1 26.0 - 34.0 pg   MCHC 32.9 30.0 - 36.0 g/dL   RDW 14.6 11.5 - 15.5 %   Platelets 215 150 - 400 K/uL   Neutrophils Relative % 56 %   Neutro Abs 4.9 1.7 - 7.7 K/uL   Lymphocytes Relative 33 %   Lymphs Abs 2.9 0.7 - 4.0 K/uL    Monocytes Relative 10 %   Monocytes Absolute 0.9 0.1 - 1.0 K/uL   Eosinophils Relative 1 %   Eosinophils Absolute 0.1 0.0 - 0.7 K/uL   Basophils Relative 0 %   Basophils Absolute 0.0 0.0 - 0.1 K/uL  Comprehensive metabolic panel     Status: Abnormal   Collection Time: 01/05/16  5:20 AM  Result Value Ref Range   Sodium 139 135 - 145 mmol/L   Potassium 3.5 3.5 - 5.1 mmol/L   Chloride 102 101 - 111 mmol/L   CO2 29 22 - 32 mmol/L   Glucose, Bld 85 65 - 99 mg/dL   BUN 8 6 - 20 mg/dL   Creatinine, Ser 0.74 0.61 - 1.24 mg/dL   Calcium 8.6 (L) 8.9 - 10.3 mg/dL   Total Protein 6.4 (L) 6.5 - 8.1 g/dL   Albumin 3.4 (L) 3.5 - 5.0 g/dL   AST 42 (H) 15 - 41 U/L   ALT 9 (L) 17 - 63 U/L   Alkaline Phosphatase 74 38 - 126 U/L   Total Bilirubin 1.2 0.3 - 1.2 mg/dL   GFR calc non Af Amer >60 >60 mL/min   GFR calc Af Amer >60 >60 mL/min    Comment: (NOTE) The eGFR has been calculated using the CKD EPI equation. This calculation has not been validated in all clinical situations. eGFR's persistently <60 mL/min signify possible Chronic Kidney Disease.    Anion gap 8 5 - 15    ABGS  Recent Labs  01/03/16 2356  TCO2 25   CULTURES Recent Results (from the past 240 hour(s))  Culture, blood (routine x 2)  Status: None (Preliminary result)   Collection Time: 01/04/16  1:50 AM  Result Value Ref Range Status   Specimen Description BLOOD LEFT FOREARM  Final   Special Requests BOTTLES DRAWN AEROBIC AND ANAEROBIC 10CC EACH  Final   Culture PENDING  Incomplete   Report Status PENDING  Incomplete  Culture, blood (routine x 2)     Status: None (Preliminary result)   Collection Time: 01/04/16  1:58 AM  Result Value Ref Range Status   Specimen Description BLOOD LEFT ANTECUBITAL  Final   Special Requests BOTTLES DRAWN AEROBIC AND ANAEROBIC 10CC EACH  Final   Culture PENDING  Incomplete   Report Status PENDING  Incomplete  MRSA PCR Screening     Status: Abnormal   Collection Time: 01/04/16  2:04  AM  Result Value Ref Range Status   MRSA by PCR POSITIVE (A) NEGATIVE Final    Comment:        The GeneXpert MRSA Assay (FDA approved for NASAL specimens only), is one component of a comprehensive MRSA colonization surveillance program. It is not intended to diagnose MRSA infection nor to guide or monitor treatment for MRSA infections. RESULT CALLED TO, READ BACK BY AND VERIFIED WITH: Inocencio Homes AT 0401 ON [id number] BY Mikel Cella K    Studies/Results: Mr Brain Wo Contrast  Result Date: 01/04/2016 CLINICAL DATA:  Unresponsive. Altered mental status. Found down by son, nonverbal and confused. History of stroke, hypertension, and seizures. LEFT-sided weakness is reported. EXAM: MRI HEAD WITHOUT CONTRAST TECHNIQUE: Multiplanar, multiecho pulse sequences of the brain and surrounding structures were obtained without intravenous contrast. COMPARISON:  CT head 01/03/2016.  Most recent MR brain 01/11/2015. FINDINGS: Motion and susceptibility artifact limits diagnostic accuracy. Small or subtle lesions could be overlooked. Brain: No evidence for acute infarction, hemorrhage, mass lesion, or extra-axial fluid. Generalized atrophy. Chronic microvascular ischemic change. Large remote RIGHT MCA territory infarct, affecting the frontal, parietal, and temporal lobes as well as the insula and basal ganglia. Small chronic LEFT cerebellar infarcts. Hydrocephalus ex vacuo. Vascular: Flow voids are maintained. Chronic hemorrhage accompanies the old RIGHT-sided infarct, with hemosiderin standing of the gyri as well as deep within the area of encephalomalacia. This localized superficial siderosis could contribute to focal or generalized seizures. Skull and upper cervical spine: Normal marrow signal. Sinuses/Orbits: Chronically inspissated secretions and atelectasis of the LEFT maxillary sinus. Mild mucosal thickening elsewhere. Other: None. IMPRESSION: Markedly motion degraded study demonstrating no acute intracranial  findings. Specifically, no acute RIGHT hemisphere infarct or gyriform restricted diffusion. 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 In the appropriate setting, LEFT-sided weakness could result from postictal phenomenon. Electronically Signed   By: Staci Righter M.D.   On: 01/04/2016 16:32   Dg Chest Port 1 View  Result Date: 01/03/2016 CLINICAL DATA:  Patient found on floor.  Nonverbal and confused. EXAM: PORTABLE CHEST 1 VIEW COMPARISON:  01/10/2015 CXR FINDINGS: Stable borderline cardiomegaly with aortic atherosclerosis. Slightly low lung volumes without pneumonic consolidation or CHF. No effusion or pneumothorax. No suspicious osseous abnormality. IMPRESSION: Stable borderline cardiomegaly with aortic atherosclerosis. No acute pulmonary disease. Electronically Signed   By: Ashley Royalty M.D.   On: 01/03/2016 21:33   Ct Head Code Stroke Wo Contrast`  Result Date: 01/03/2016 CLINICAL DATA:  Code stroke. Found down by son, nonverbal and confused. History of stroke, hypertension, seizures. EXAM: CT HEAD WITHOUT CONTRAST TECHNIQUE: Contiguous axial images were obtained from the base of the skull through the vertex without intravenous  contrast. COMPARISON:  MRI of the head January 11, 2015 and CT HEAD January 10, 2015 FINDINGS: BRAIN: The ventricles and sulci are normal for age. No intraparenchymal hemorrhage, mass effect nor midline shift. Old small LEFT cerebellar infarcts. RIGHT frontotemporal parietal encephalomalacia involving the RIGHT basal ganglia. Mild ex vacuo dilatation RIGHT lateral ventricle, no hydrocephalus. Patchy white matter changes compatible with mild chronic small vessel ischemic disease. No acute large vascular territory infarcts. No abnormal extra-axial fluid collections. Basal cisterns are patent. VASCULAR: Moderate calcific atherosclerosis of the carotid siphons. SKULL: No skull fracture. No significant scalp soft tissue  swelling. SINUSES/ORBITS: Mild maxillary sinus mucosal thickening. The included ocular globes and orbital contents are non-suspicious. OTHER: None. ASPECTS [facility] Stroke Program Early CT Score) - Ganglionic level infarction (caudate, lentiform nuclei, internal capsule, insula, M1-M3 cortex): 7 - Supraganglionic infarction (M4-M6 cortex): 3 Total score (0-10 with 10 being normal): 10 IMPRESSION: 1. No acute intracranial process. Stable examination including old large RIGHT MCA territory infarct and small LEFT cerebellar infarcts. 2. ASPECTS is 10. Critical Value/emergent results were called by telephone at the time of interpretation on 01/03/2016 at 8:45 pm to Dr. Fredia Sorrow , who verbally acknowledged these results. Electronically Signed   By: Elon Alas M.D.   On: 01/03/2016 20:45    Medications:  Prior to Admission:  Prescriptions Prior to Admission  Medication Sig Dispense Refill Last Dose  . albuterol (PROVENTIL HFA) 108 (90 BASE) MCG/ACT inhaler Inhale 2 puffs into the lungs every 6 (six) hours as needed for wheezing or shortness of breath.   01/03/2016 at Unknown time  . ALPRAZolam (XANAX) 1 MG tablet Take 1 mg by mouth 4 (four) times daily as needed for anxiety.   01/03/2016 at Unknown time  . atorvastatin (LIPITOR) 40 MG tablet Take 40 mg by mouth at bedtime.    01/03/2016 at Unknown time  . midodrine (PROAMATINE) 5 MG tablet Take 1 tablet (5 mg total) by mouth 3 (three) times daily with meals.   01/03/2016 at Unknown time  . nitroGLYCERIN (NITROSTAT) 0.4 MG SL tablet Place 0.4 mg under the tongue every 5 (five) minutes as needed for chest pain.    Past Month at Unknown time  . pantoprazole (PROTONIX) 40 MG tablet Take 40 mg by mouth daily.     01/03/2016 at Unknown time  . valproic acid (DEPAKENE) 250 MG capsule Take 250 mg by mouth 3 (three) times daily.   01/03/2016 at Unknown time   Scheduled: . Chlorhexidine Gluconate Cloth  6 each Topical Q0600  . enoxaparin (LOVENOX)  injection  40 mg Subcutaneous Q24H  . famotidine (PEPCID) IV  20 mg Intravenous Q12H  . Influenza vac split quadrivalent PF  0.5 mL Intramuscular Tomorrow-1000  . levETIRAcetam  1,000 mg Intravenous Q12H  . mupirocin ointment  1 application Nasal BID  . piperacillin-tazobactam (ZOSYN)  IV  3.375 g Intravenous Q8H  . vancomycin  1,000 mg Intravenous Q12H   Continuous: . dextrose 5 % and 0.9% NaCl 100 mL/hr at 01/05/16 0756   ZDG:[id number], LORazepam  Assesment:He was admitted with altered mental status felt to be related to seizure with prolonged postictal state. He is known to have had a previous stroke but does not appear to have had another period he was very restless but that's better. At baseline he has hypertension coronary disease the previous stroke and he has anxiety and depression at baseline and has had trouble controlling that and required psychiatric care for many years. Principal Problem:  Seizure disorder [facility]) Active Problems:   Hyperlipidemia   ANXIETY DEPRESSION   Essential hypertension   Coronary atherosclerosis   Stroke [facility])   GERD    Plan: No change in treatments right now. He is improving. He has had to go to skilled care facility in the past and I suspect that he will have to do that again but I don't think he is quite ready for PT consultation at this point.    LOS: 2 days   Samyuktha Brau L 01/05/2016, 8:08 AM

## 2016-01-06 NOTE — Progress Notes (Signed)
Subjective: He has continued having some seizure activity despite being on Keppra and Depakote. He was awake most of the night and he sleepy now but he is arousable in clearly better than on admission.  Objective: Vital signs in last 24 hours: Temp:  [97.1 F (36.2 C)-98.5 F (36.9 C)] 97.1 F (36.2 C) (12/30 0400) Pulse Rate:  [46-86] 55 (12/30 0600) Resp:  [12-23] 16 (12/30 0600) BP: (93-148)/(51-119) 127/77 (12/30 0600) SpO2:  [88 %-96 %] 89 % (12/30 0600) Weight:  [79.4 kg (175 lb 0.7 oz)] 79.4 kg (175 lb 0.7 oz) (12/30 0400) Weight change: 1.6 kg (3 lb 8.4 oz) Last BM Date: 01/05/16  Intake/Output from previous day: 12/29 0701 - 12/30 0700 In: 5525 [P.O.:480; I.V.:3335; IV Piggyback:1710] Out: 2900 [Urine:2900]  PHYSICAL EXAM General appearance: Sleepy but responsive. Chronically ill Resp: rhonchi bilaterally Cardio: regular rate and rhythm, S1, S2 normal, no murmur, click, rub or gallop GI: soft, non-tender; bowel sounds normal; no masses,  no organomegaly Extremities: extremities normal, atraumatic, no cyanosis or edema Eyes: Pupils react. Ears nose mouth and throat his mucous membranes are dry. He is missing multiple teeth. Skin: Warm and dry  Lab Results:  Results for orders placed or performed during the hospital encounter of 01/03/16 (from the past 48 hour(s))  Urinalysis, Routine w reflex microscopic     Status: Abnormal   Collection Time: 01/04/16  2:59 PM  Result Value Ref Range   Color, Urine AMBER (A) YELLOW    Comment: BIOCHEMICALS MAY BE AFFECTED BY COLOR   APPearance HAZY (A) CLEAR   Specific Gravity, Urine >1.030 (H) 1.005 - 1.030   pH 6.0 5.0 - 8.0   Glucose, UA NEGATIVE NEGATIVE mg/dL   Hgb urine dipstick NEGATIVE NEGATIVE   Bilirubin Urine SMALL (A) NEGATIVE   Ketones, ur 40 (A) NEGATIVE mg/dL   Protein, ur NEGATIVE NEGATIVE mg/dL   Nitrite NEGATIVE NEGATIVE   Leukocytes, UA NEGATIVE NEGATIVE    Comment: Microscopic not done on urines with  negative protein, blood, leukocytes, nitrite, or glucose < 500 mg/dL.  Urine rapid drug screen (hosp performed)     Status: Abnormal   Collection Time: 01/04/16  2:59 PM  Result Value Ref Range   Opiates NONE DETECTED NONE DETECTED   Cocaine NONE DETECTED NONE DETECTED   Benzodiazepines POSITIVE (A) NONE DETECTED   Amphetamines NONE DETECTED NONE DETECTED   Tetrahydrocannabinol NONE DETECTED NONE DETECTED   Barbiturates NONE DETECTED NONE DETECTED    Comment:        DRUG SCREEN FOR MEDICAL PURPOSES ONLY.  IF CONFIRMATION IS NEEDED FOR ANY PURPOSE, NOTIFY LAB WITHIN 5 DAYS.        LOWEST DETECTABLE LIMITS FOR URINE DRUG SCREEN Drug Class       Cutoff (ng/mL) Amphetamine      1000 Barbiturate      200 Benzodiazepine   509 Tricyclics       326 Opiates          300 Cocaine          300 THC              50   CBC with Differential/Platelet     Status: None   Collection Time: 01/05/16  5:20 AM  Result Value Ref Range   WBC 8.8 4.0 - 10.5 K/uL   RBC 4.64 4.22 - 5.81 MIL/uL   Hemoglobin 14.9 13.0 - 17.0 g/dL   HCT 45.3 39.0 - 52.0 %   MCV 97.6 78.0 -  100.0 fL   MCH 32.1 26.0 - 34.0 pg   MCHC 32.9 30.0 - 36.0 g/dL   RDW 14.6 11.5 - 15.5 %   Platelets 215 150 - 400 K/uL   Neutrophils Relative % 56 %   Neutro Abs 4.9 1.7 - 7.7 K/uL   Lymphocytes Relative 33 %   Lymphs Abs 2.9 0.7 - 4.0 K/uL   Monocytes Relative 10 %   Monocytes Absolute 0.9 0.1 - 1.0 K/uL   Eosinophils Relative 1 %   Eosinophils Absolute 0.1 0.0 - 0.7 K/uL   Basophils Relative 0 %   Basophils Absolute 0.0 0.0 - 0.1 K/uL  Comprehensive metabolic panel     Status: Abnormal   Collection Time: 01/05/16  5:20 AM  Result Value Ref Range   Sodium 139 135 - 145 mmol/L   Potassium 3.5 3.5 - 5.1 mmol/L   Chloride 102 101 - 111 mmol/L   CO2 29 22 - 32 mmol/L   Glucose, Bld 85 65 - 99 mg/dL   BUN 8 6 - 20 mg/dL   Creatinine, Ser 0.74 0.61 - 1.24 mg/dL   Calcium 8.6 (L) 8.9 - 10.3 mg/dL   Total Protein 6.4 (L) 6.5  - 8.1 g/dL   Albumin 3.4 (L) 3.5 - 5.0 g/dL   AST 42 (H) 15 - 41 U/L   ALT 9 (L) 17 - 63 U/L   Alkaline Phosphatase 74 38 - 126 U/L   Total Bilirubin 1.2 0.3 - 1.2 mg/dL   GFR calc non Af Amer >60 >60 mL/min   GFR calc Af Amer >60 >60 mL/min    Comment: (NOTE) The eGFR has been calculated using the CKD EPI equation. This calculation has not been validated in all clinical situations. eGFR's persistently <60 mL/min signify possible Chronic Kidney Disease.    Anion gap 8 5 - 15    ABGS  Recent Labs  01/03/16 2356  TCO2 25   CULTURES Recent Results (from the past 240 hour(s))  Culture, blood (routine x 2)     Status: None (Preliminary result)   Collection Time: 01/04/16  1:50 AM  Result Value Ref Range Status   Specimen Description BLOOD LEFT FOREARM  Final   Special Requests BOTTLES DRAWN AEROBIC AND ANAEROBIC 10CC EACH  Final   Culture NO GROWTH 1 DAY  Final   Report Status PENDING  Incomplete  Culture, blood (routine x 2)     Status: None (Preliminary result)   Collection Time: 01/04/16  1:58 AM  Result Value Ref Range Status   Specimen Description BLOOD LEFT ANTECUBITAL  Final   Special Requests BOTTLES DRAWN AEROBIC AND ANAEROBIC 10CC EACH  Final   Culture NO GROWTH 1 DAY  Final   Report Status PENDING  Incomplete  MRSA PCR Screening     Status: Abnormal   Collection Time: 01/04/16  2:04 AM  Result Value Ref Range Status   MRSA by PCR POSITIVE (A) NEGATIVE Final    Comment:        The GeneXpert MRSA Assay (FDA approved for NASAL specimens only), is one component of a comprehensive MRSA colonization surveillance program. It is not intended to diagnose MRSA infection nor to guide or monitor treatment for MRSA infections. RESULT CALLED TO, READ BACK BY AND VERIFIED WITH: Inocencio Homes AT 0401 ON [id number] BY Mikel Cella K    Studies/Results: Mr Brain Wo Contrast  Result Date: 01/04/2016 CLINICAL DATA:  Unresponsive. Altered mental status. Found down by son, nonverbal  and confused. History  of stroke, hypertension, and seizures. LEFT-sided weakness is reported. EXAM: MRI HEAD WITHOUT CONTRAST TECHNIQUE: Multiplanar, multiecho pulse sequences of the brain and surrounding structures were obtained without intravenous contrast. COMPARISON:  CT head 01/03/2016.  Most recent MR brain 01/11/2015. FINDINGS: Motion and susceptibility artifact limits diagnostic accuracy. Small or subtle lesions could be overlooked. Brain: No evidence for acute infarction, hemorrhage, mass lesion, or extra-axial fluid. Generalized atrophy. Chronic microvascular ischemic change. Large remote RIGHT MCA territory infarct, affecting the frontal, parietal, and temporal lobes as well as the insula and basal ganglia. Small chronic LEFT cerebellar infarcts. Hydrocephalus ex vacuo. Vascular: Flow voids are maintained. Chronic hemorrhage accompanies the old RIGHT-sided infarct, with hemosiderin standing of the gyri as well as deep within the area of encephalomalacia. This localized superficial siderosis could contribute to focal or generalized seizures. Skull and upper cervical spine: Normal marrow signal. Sinuses/Orbits: Chronically inspissated secretions and atelectasis of the LEFT maxillary sinus. Mild mucosal thickening elsewhere. Other: None. IMPRESSION: Markedly motion degraded study demonstrating no acute intracranial findings. Specifically, no acute RIGHT hemisphere infarct or gyriform restricted diffusion. 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 In the appropriate setting, LEFT-sided weakness could result from postictal phenomenon. Electronically Signed   By: Staci Righter M.D.   On: 01/04/2016 16:32    Medications:  Prior to Admission:  Prescriptions Prior to Admission  Medication Sig Dispense Refill Last Dose  . albuterol (PROVENTIL HFA) 108 (90 BASE) MCG/ACT inhaler Inhale 2 puffs into the lungs every 6 (six) hours as needed for  wheezing or shortness of breath.   01/03/2016 at Unknown time  . ALPRAZolam (XANAX) 1 MG tablet Take 1 mg by mouth 4 (four) times daily as needed for anxiety.   01/03/2016 at Unknown time  . atorvastatin (LIPITOR) 40 MG tablet Take 40 mg by mouth at bedtime.    01/03/2016 at Unknown time  . midodrine (PROAMATINE) 5 MG tablet Take 1 tablet (5 mg total) by mouth 3 (three) times daily with meals.   01/03/2016 at Unknown time  . nitroGLYCERIN (NITROSTAT) 0.4 MG SL tablet Place 0.4 mg under the tongue every 5 (five) minutes as needed for chest pain.    Past Month at Unknown time  . pantoprazole (PROTONIX) 40 MG tablet Take 40 mg by mouth daily.     01/03/2016 at Unknown time  . valproic acid (DEPAKENE) 250 MG capsule Take 250 mg by mouth 3 (three) times daily.   01/03/2016 at Unknown time   Scheduled: . Chlorhexidine Gluconate Cloth  6 each Topical Q0600  . enoxaparin (LOVENOX) injection  40 mg Subcutaneous Q24H  . famotidine (PEPCID) IV  20 mg Intravenous Q12H  . levETIRAcetam  1,500 mg Intravenous Q12H  . mupirocin ointment  1 application Nasal BID  . piperacillin-tazobactam (ZOSYN)  IV  3.375 g Intravenous Q8H  . valproate sodium  500 mg Intravenous Q8H  . vancomycin  1,000 mg Intravenous Q12H   Continuous: . dextrose 5 % and 0.9% NaCl 100 mL/hr at 01/06/16 0849   TKW:[id number], LORazepam  Assesment:He has seizure disorder with prolonged postictal state. He has had a previous stroke. No new stroke documented by CT or MRI. He is continuing to have seizures despite IV Keppra and Depakote. I'd like to see how he does over the next 24 hours but he keeps having seizures we will have to add further medication for seizures. At baseline he has severe anxiety and depression/bipolar disease. He has had significant orthostasis over  the last 10 years and has had multiple falls. At baseline he has coronary disease but is not having any symptoms Principal Problem:   Seizure disorder [facility]) Active  Problems:   Hyperlipidemia   ANXIETY DEPRESSION   Essential hypertension   Coronary atherosclerosis   Stroke [facility])   GERD    Plan: Continue Depakote and Keppra IV. See if we can get his seizures controlled with that if not we'll need to add further seizure medication.    LOS: 3 days   Ming Kunka L 01/06/2016, 8:54 AM

## 2016-01-07 LAB — VALPROIC ACID LEVEL: Valproic Acid Lvl: 90 ug/mL (ref 50.0–100.0)

## 2016-01-07 MED ORDER — LEVETIRACETAM IN NACL 1500 MG/100ML IV SOLN
1500.0000 mg | Freq: Two times a day (BID) | INTRAVENOUS | Status: DC
  Administered 2016-01-07 – 2016-01-09 (×6): 1500 mg via INTRAVENOUS
  Filled 2016-01-07 (×9): qty 100

## 2016-01-07 MED ORDER — VALPROATE SODIUM 500 MG/5ML IV SOLN
750.0000 mg | Freq: Three times a day (TID) | INTRAVENOUS | Status: DC
  Administered 2016-01-07 – 2016-01-10 (×9): 750 mg via INTRAVENOUS
  Filled 2016-01-07 (×12): qty 7.5

## 2016-01-07 NOTE — Progress Notes (Signed)
Subjective: He is overall about the same. He is having some more atypical seizure activity this morning. This consists of being unresponsive to verbal pending turning to his left and staring. He is still on Keppra and Depakote. No other new problems. He's not had nausea or vomiting. He has eaten well. No cough or congestion. No chest pain.  Objective: Vital signs in last 24 hours: Temp:  [97.5 F (36.4 C)-98.2 F (36.8 C)] 97.6 F (36.4 C) (12/31 0800) Pulse Rate:  [48-146] 61 (12/31 0900) Resp:  [13-22] 16 (12/31 0900) BP: (106-161)/(65-110) 121/84 (12/31 0900) SpO2:  [80 %-99 %] 94 % (12/31 0900) Weight:  [80.7 kg (177 lb 14.6 oz)] 80.7 kg (177 lb 14.6 oz) (12/31 0500) Weight change: 1.3 kg (2 lb 13.9 oz) Last BM Date: 01/05/16  Intake/Output from previous day: 12/30 0701 - 12/31 0700 In: 4140 [P.O.:890; I.V.:2400; IV Piggyback:850] Out: 3000 [Urine:3000]  PHYSICAL EXAM General appearance: He is alert when he is not having the seizure activity. Resp: rhonchi bilaterally Cardio: regular rate and rhythm, S1, S2 normal, no murmur, click, rub or gallop GI: soft, non-tender; bowel sounds normal; no masses,  no organomegaly Extremities: extremities normal, atraumatic, no cyanosis or edema Skin warm and dry. Pupils react  Lab Results:  No results found for this or any previous visit (from the past 48 hour(s)).  ABGS No results for input(s): PHART, PO2ART, TCO2, HCO3 in the last 72 hours.  Invalid input(s): PCO2 CULTURES Recent Results (from the past 240 hour(s))  Culture, blood (routine x 2)     Status: None (Preliminary result)   Collection Time: 01/04/16  1:50 AM  Result Value Ref Range Status   Specimen Description BLOOD LEFT FOREARM  Final   Special Requests BOTTLES DRAWN AEROBIC AND ANAEROBIC 10CC EACH  Final   Culture NO GROWTH 2 DAYS  Final   Report Status PENDING  Incomplete  Culture, blood (routine x 2)     Status: None (Preliminary result)   Collection Time:  01/04/16  1:58 AM  Result Value Ref Range Status   Specimen Description BLOOD LEFT ANTECUBITAL  Final   Special Requests BOTTLES DRAWN AEROBIC AND ANAEROBIC 10CC EACH  Final   Culture NO GROWTH 2 DAYS  Final   Report Status PENDING  Incomplete  MRSA PCR Screening     Status: Abnormal   Collection Time: 01/04/16  2:04 AM  Result Value Ref Range Status   MRSA by PCR POSITIVE (A) NEGATIVE Final    Comment:        The GeneXpert MRSA Assay (FDA approved for NASAL specimens only), is one component of a comprehensive MRSA colonization surveillance program. It is not intended to diagnose MRSA infection nor to guide or monitor treatment for MRSA infections. RESULT CALLED TO, READ BACK BY AND VERIFIED WITH: Inocencio Homes AT 0401 ON [id number] BY FORSYTH K    Studies/Results: No results found.  Medications:  Prior to Admission:  Prescriptions Prior to Admission  Medication Sig Dispense Refill Last Dose  . albuterol (PROVENTIL HFA) 108 (90 BASE) MCG/ACT inhaler Inhale 2 puffs into the lungs every 6 (six) hours as needed for wheezing or shortness of breath.   01/03/2016 at Unknown time  . ALPRAZolam (XANAX) 1 MG tablet Take 1 mg by mouth 4 (four) times daily as needed for anxiety.   01/03/2016 at Unknown time  . atorvastatin (LIPITOR) 40 MG tablet Take 40 mg by mouth at bedtime.    01/03/2016 at Unknown time  . midodrine (  PROAMATINE) 5 MG tablet Take 1 tablet (5 mg total) by mouth 3 (three) times daily with meals.   01/03/2016 at Unknown time  . nitroGLYCERIN (NITROSTAT) 0.4 MG SL tablet Place 0.4 mg under the tongue every 5 (five) minutes as needed for chest pain.    Past Month at Unknown time  . pantoprazole (PROTONIX) 40 MG tablet Take 40 mg by mouth daily.     01/03/2016 at Unknown time  . valproic acid (DEPAKENE) 250 MG capsule Take 250 mg by mouth 3 (three) times daily.   01/03/2016 at Unknown time   Scheduled: . Chlorhexidine Gluconate Cloth  6 each Topical Q0600  . enoxaparin (LOVENOX)  injection  40 mg Subcutaneous Q24H  . famotidine (PEPCID) IV  20 mg Intravenous Q12H  . levETIRAcetam  1,500 mg Intravenous Q12H  . mupirocin ointment  1 application Nasal BID  . valproate sodium  500 mg Intravenous Q8H   Continuous: . dextrose 5 % and 0.9% NaCl 100 mL/hr at 01/07/16 0600   [id number], LORazepam  Assesment:He was admitted with seizures. He is still having seizures despite his current treatment. He has had previous problems with seizure disorder. He has had a severe stroke in the past and that is the precipitating event for his seizure disorder. He has hypertension which is pre-well controlled. He has had severe problems with anxiety and depression/bipolar disease and that is stable Principal Problem:   Seizure disorder [facility]) Active Problems:   Hyperlipidemia   ANXIETY DEPRESSION   Essential hypertension   Coronary atherosclerosis   Stroke [facility])   GERD    Plan: Increase Depakote. Continue the rest of his treatments. Check Depakote level. Check Keppra level. Discontinue antibiotics as we have no evidence of infection at this time    LOS: 4 days   Shawnita Krizek L 01/07/2016, 9:56 AM

## 2016-01-08 LAB — BASIC METABOLIC PANEL
ANION GAP: 7 (ref 5–15)
BUN: 11 mg/dL (ref 6–20)
CO2: 32 mmol/L (ref 22–32)
Calcium: 9 mg/dL (ref 8.9–10.3)
Chloride: 101 mmol/L (ref 101–111)
Creatinine, Ser: 0.78 mg/dL (ref 0.61–1.24)
GFR calc Af Amer: >60 mL/min (ref >60)
GLUCOSE: 104 mg/dL — AB (ref 65–99)
POTASSIUM: 3.1 mmol/L — AB (ref 3.5–5.1)
Sodium: 140 mmol/L (ref 135–145)

## 2016-01-08 MED ORDER — POTASSIUM CHLORIDE CRYS ER 20 MEQ PO TBCR
20.0000 meq | EXTENDED_RELEASE_TABLET | Freq: Two times a day (BID) | ORAL | Status: DC
  Administered 2016-01-08 – 2016-01-10 (×5): 20 meq via ORAL
  Filled 2016-01-08 (×5): qty 1

## 2016-01-08 NOTE — Progress Notes (Signed)
Subjective: He is overall about the same. Very sleepy this morning. It's not clear if he's had more seizure activity or not. He will arouse  Objective: Vital signs in last 24 hours: Temp:  [97.3 F (36.3 C)-98.5 F (36.9 C)] 97.3 F (36.3 C) (01/01 0821) Pulse Rate:  [48-89] 63 (01/01 0600) Resp:  [12-24] 12 (01/01 0600) BP: (100-163)/(59-114) 128/59 (01/01 0600) SpO2:  [92 %-99 %] 98 % (01/01 0600) Weight:  [81.6 kg (179 lb 14.3 oz)] 81.6 kg (179 lb 14.3 oz) (01/01 0500) Weight change: 0.9 kg (1 lb 15.8 oz) Last BM Date: 01/08/16  Intake/Output from previous day: 12/31 0701 - 01/01 0700 In: 3290 [P.O.:1440; I.V.:1600; IV Piggyback:250] Out: 3225 [Urine:3225]  PHYSICAL EXAM General appearance: Sleepy but arousable. Questionable postictal Resp: rhonchi bilaterally Cardio: regular rate and rhythm, S1, S2 normal, no murmur, click, rub or gallop GI: soft, non-tender; bowel sounds normal; no masses,  no organomegaly Extremities: extremities normal, atraumatic, no cyanosis or edema Skin warm and dry. Mucous membranes dry  Lab Results:  Results for orders placed or performed during the hospital encounter of 01/03/16 (from the past 48 hour(s))  Valproic acid level     Status: None   Collection Time: 01/07/16  9:28 AM  Result Value Ref Range   Valproic Acid Lvl 90 50.0 - 100.0 ug/mL  Basic metabolic panel     Status: Abnormal   Collection Time: 01/08/16  4:56 AM  Result Value Ref Range   Sodium 140 135 - 145 mmol/L   Potassium 3.1 (L) 3.5 - 5.1 mmol/L   Chloride 101 101 - 111 mmol/L   CO2 32 22 - 32 mmol/L   Glucose, Bld 104 (H) 65 - 99 mg/dL   BUN 11 6 - 20 mg/dL   Creatinine, Ser 0.78 0.61 - 1.24 mg/dL   Calcium 9.0 8.9 - 10.3 mg/dL   GFR calc non Af Amer >60 >60 mL/min   GFR calc Af Amer >60 >60 mL/min    Comment: (NOTE) The eGFR has been calculated using the CKD EPI equation. This calculation has not been validated in all clinical situations. eGFR's persistently <60  mL/min signify possible Chronic Kidney Disease.    Anion gap 7 5 - 15    ABGS No results for input(s): PHART, PO2ART, TCO2, HCO3 in the last 72 hours.  Invalid input(s): PCO2 CULTURES Recent Results (from the past 240 hour(s))  Culture, blood (routine x 2)     Status: None (Preliminary result)   Collection Time: 01/04/16  1:50 AM  Result Value Ref Range Status   Specimen Description BLOOD LEFT FOREARM  Final   Special Requests BOTTLES DRAWN AEROBIC AND ANAEROBIC 10CC EACH  Final   Culture NO GROWTH 3 DAYS  Final   Report Status PENDING  Incomplete  Culture, blood (routine x 2)     Status: None (Preliminary result)   Collection Time: 01/04/16  1:58 AM  Result Value Ref Range Status   Specimen Description BLOOD LEFT ANTECUBITAL  Final   Special Requests BOTTLES DRAWN AEROBIC AND ANAEROBIC 10CC EACH  Final   Culture NO GROWTH 3 DAYS  Final   Report Status PENDING  Incomplete  MRSA PCR Screening     Status: Abnormal   Collection Time: 01/04/16  2:04 AM  Result Value Ref Range Status   MRSA by PCR POSITIVE (A) NEGATIVE Final    Comment:        The GeneXpert MRSA Assay (FDA approved for NASAL specimens only), is one  component of a comprehensive MRSA colonization surveillance program. It is not intended to diagnose MRSA infection nor to guide or monitor treatment for MRSA infections. RESULT CALLED TO, READ BACK BY AND VERIFIED WITH: Inocencio Homes AT 0401 ON [id number] BY FORSYTH K    Studies/Results: No results found.  Medications:  Prior to Admission:  Prescriptions Prior to Admission  Medication Sig Dispense Refill Last Dose  . albuterol (PROVENTIL HFA) 108 (90 BASE) MCG/ACT inhaler Inhale 2 puffs into the lungs every 6 (six) hours as needed for wheezing or shortness of breath.   01/03/2016 at Unknown time  . ALPRAZolam (XANAX) 1 MG tablet Take 1 mg by mouth 4 (four) times daily as needed for anxiety.   01/03/2016 at Unknown time  . atorvastatin (LIPITOR) 40 MG tablet Take 40 mg  by mouth at bedtime.    01/03/2016 at Unknown time  . midodrine (PROAMATINE) 5 MG tablet Take 1 tablet (5 mg total) by mouth 3 (three) times daily with meals.   01/03/2016 at Unknown time  . nitroGLYCERIN (NITROSTAT) 0.4 MG SL tablet Place 0.4 mg under the tongue every 5 (five) minutes as needed for chest pain.    Past Month at Unknown time  . pantoprazole (PROTONIX) 40 MG tablet Take 40 mg by mouth daily.     01/03/2016 at Unknown time  . valproic acid (DEPAKENE) 250 MG capsule Take 250 mg by mouth 3 (three) times daily.   01/03/2016 at Unknown time   Scheduled: . Chlorhexidine Gluconate Cloth  6 each Topical Q0600  . enoxaparin (LOVENOX) injection  40 mg Subcutaneous Q24H  . famotidine (PEPCID) IV  20 mg Intravenous Q12H  . levETIRAcetam  1,500 mg Intravenous Q12H  . mupirocin ointment  1 application Nasal BID  . potassium chloride  20 mEq Oral BID  . valproate sodium  750 mg Intravenous Q8H   Continuous: . dextrose 5 % and 0.9% NaCl 100 mL/hr at 01/08/16 0600   WVX:[id number], LORazepam  Assesment:He has complex partial status epilepticus. He is still sleepy. I increased the dose of Depakote yesterday but his Depakote level was therapeutic. He has had a severe stroke with no evidence of recurrence. I don't think he can be safely moved off a step down yet Principal Problem:   Seizure disorder [facility]) Active Problems:   Hyperlipidemia   ANXIETY DEPRESSION   Essential hypertension   Coronary atherosclerosis   Stroke [facility])   GERD    Plan: Continue current treatments. Continue higher dose of Depakote. Observe for further seizures.    LOS: 5 days   Jeffrey Sweeney L 01/08/2016, 9:33 AM

## 2016-01-08 NOTE — Progress Notes (Signed)
PT Cancellation Note  Patient Details Name: Jeffrey Sweeney MRN: [id number] DOB: 08-Feb-1945   Cancelled Treatment:    Per MD note on 1/1 it is unclear if patient is continuing to experience ongoing active seizure activity. Will evaluate on 1/2 if seizure activity appears more stable.   Deniece Ree PT, DPT [phone]

## 2016-01-09 ENCOUNTER — Inpatient Hospital Stay (HOSPITAL_COMMUNITY)
Admit: 2016-01-09 | Discharge: 2016-01-09 | Disposition: A | Discharge status: Home / Self Care | Payer: Commercial Managed Care - HMO | Attending: Neurology | Admitting: Neurology

## 2016-01-09 DIAGNOSIS — G9340 Encephalopathy, unspecified: Secondary | ICD-10-CM | POA: Diagnosis not present

## 2016-01-09 DIAGNOSIS — G40901 Epilepsy, unspecified, not intractable, with status epilepticus: Secondary | ICD-10-CM | POA: Diagnosis present

## 2016-01-09 DIAGNOSIS — R569 Unspecified convulsions: Secondary | ICD-10-CM | POA: Diagnosis not present

## 2016-01-09 LAB — CULTURE, BLOOD (ROUTINE X 2)
CULTURE: NO GROWTH
Culture: NO GROWTH

## 2016-01-09 LAB — VALPROIC ACID LEVEL: Valproic Acid Lvl: 77 ug/mL (ref 50.0–100.0)

## 2016-01-09 MED ORDER — PANTOPRAZOLE SODIUM 40 MG PO TBEC
40.0000 mg | DELAYED_RELEASE_TABLET | Freq: Every day | ORAL | Status: DC
  Administered 2016-01-09 – 2016-01-10 (×2): 40 mg via ORAL
  Filled 2016-01-09 (×2): qty 1

## 2016-01-09 NOTE — Progress Notes (Signed)
Speech Language Pathology Treatment: Dysphagia  Patient Details Name: Jeffrey Sweeney MRN: [id number] DOB: 10-Aug-1945 Today's Date: 01/09/2016 Time: 1946-2000 SLP Time Calculation (min) (ACUTE ONLY): 14 min  Assessment / Plan / Recommendation Clinical Impression  Pt seen for diet tolerance and implementation of strategies. Pt was alert and sitting upright in bed upon SLP arrival. Pt had just finished dinner meal and reports no difficulty with eating/drinking/swallowing. Pt observed with thin liquids via self presented straw sips and shows no signs of decreased airway protection. Pt states that he has been satisfied with D2/chopped due to poor dentition, so will keep diet as is. Pt oriented to January. When SLP saw pt on Friday, he had several family members in the room (ICU) with him. Today, he tells me that he lives alone,drives, and has little involvement with family. Unsure if pt is still somewhat confused. No family here at present. SLP will sign off from a dysphagia perspective. If MD has concerns regarding cognition and safety at home, please order SLE.   HPI HPI: The patient is 71 year old white male who has a baseline history of seizures. The patient presented on the status. He currently is on Depakote. The workup has been unrevealing for acute causes of the patient's encephalopathy. His chart reviewed indicates that he was on Keppra in January of this year. It is unclear why this medication was discontinued. His Depakote level is just in the therapeutic range. The patient's baseline is that he ambulates without assisted devices seizure in a cane or walker. He apparently converses decently. He was loaded with Keppra on admission. Chest x-ray shows:Stable borderline cardiomegaly with aortic atherosclerosis. No acute      SLP Plan  All goals met;Discharge SLP treatment due to (comment)     Recommendations  Diet recommendations: Dysphagia 2 (fine chop);Thin liquid (per pt request due to poor  dentition) Liquids provided via: Cup;Straw Medication Administration: Whole meds with liquid Supervision: Patient able to self feed Compensations: Slow rate;Small sips/bites Postural Changes and/or Swallow Maneuvers: Out of bed for meals;Seated upright 90 degrees;Upright 30-60 min after meal                Follow up Recommendations: None (? baseline cognition) Plan: All goals met;Discharge SLP treatment due to (comment)       Thank you,  Genene Churn, [place]                 [place] 01/09/2016, 9:16 PM

## 2016-01-09 NOTE — Evaluation (Signed)
Physical Therapy Evaluation Patient Details Name: Jeffrey Sweeney MRN: [id number] DOB: 03/31/1945 Today's Date: 01/09/2016   History of Present Illness  Jeffrey Sweeney is a 71 y.o. male with medical history significant of atrial fibrillation, bipolar disorder, CAD, COPD, depression, headaches, hypertension, seizure disorder, history of CVA with residual left arm weakness who was brought to the emergency department today after the son found him lying down on the floor. He will last seen at his baseline several hours before his son found him. The patient normally is able to ambulate with assistive devices and is able to converse  Clinical Impression  PT normally lives alone and states that he is able to prepare his own meals.  Pt agreeable to therapy and follows commands but is slightly disoriented with therapist needing to remind the pt several times that he was in the hospital.  Therapist recommends OT consult as well as SNF placement at this time.   Discharge placement may change if pt progresses in the hospital.   Follow Up Recommendations SNF    Equipment Recommendations  None recommended by PT    Recommendations for Other Services   OT    Precautions / Restrictions Precautions Precautions: Fall Restrictions Weight Bearing Restrictions: No      Mobility  Bed Mobility Overal bed mobility: Needs Assistance Bed Mobility: Supine to Sit     Supine to sit: Min assist        Transfers Overall transfer level: Needs assistance Equipment used: Rolling walker (2 wheeled) Transfers: Stand Pivot Transfers   Stand pivot transfers: Min guard          Ambulation/Gait Ambulation/Gait assistance: Min guard Ambulation Distance (Feet): 4 Feet Assistive device: Rolling walker (2 wheeled) Gait Pattern/deviations: Decreased step length - right;Decreased step length - left   Gait velocity interpretation: Below normal speed for age/gender    Stairs            Wheelchair  Mobility    Modified Rankin (Stroke Patients Only)       Balance                                             Pertinent Vitals/Pain  NONE    Home Living Family/patient expects to be discharged to:: Private residence Living Arrangements: Alone Available Help at Discharge: Family Type of Home: House Home Access: Level entry     Home Layout: One level Home Equipment: Environmental consultant - 4 wheels Additional Comments: Jeffrey Sweeney is a 71 y.o. male with medical history significant of atrial fibrillation, bipolar disorder, CAD, COPD, depression, headaches, hypertension, seizure disorder, history of CVA with residual left arm weakness who was brought to the emergency department today after the son found him lying down on the floor. He will last seen at his baseline several hours before his son found him. The patient normally is able to ambulate with assistive devices and is able to converse. No further history was provided by the son.    Prior Function Level of Independence: Independent with assistive device(s)               Hand Dominance        Extremity/Trunk Assessment   Upper Extremity Assessment Upper Extremity Assessment: Defer to OT evaluation    Lower Extremity Assessment Lower Extremity Assessment: Generalized weakness       Communication   Communication:  No difficulties  Cognition Arousal/Alertness: Awake/alert Behavior During Therapy: WFL for tasks assessed/performed Overall Cognitive Status: Impaired/Different from baseline Area of Impairment: Orientation Orientation Level: Place;Situation             General Comments: Pt able to follow commands but thought he was at home.     General Comments      Exercises General Exercises - Lower Extremity Ankle Circles/Pumps: Both;10 reps Quad Sets: Both;10 reps Heel Slides: Both;10 reps Hip ABduction/ADduction: Both;10 reps Straight Leg Raises: Both;5 reps   Assessment/Plan    PT  Assessment Patient needs continued PT services  PT Problem List Decreased strength;Decreased activity tolerance;Decreased balance;Decreased mobility          PT Treatment Interventions Gait training;Therapeutic exercise;Balance training;Therapeutic activities    PT Goals (Current goals can be found in the Care Plan section)  Acute Rehab PT Goals PT Goal Formulation: With patient Time For Goal Achievement: 01/12/16 Potential to Achieve Goals: Good    Frequency Min 3X/week   Barriers to discharge        Co-evaluation               End of Session Equipment Utilized During Treatment: Gait belt   Patient left: in chair;with chair alarm set;with call bell/phone within reach Nurse Communication: Mobility status         Time: 1001-1039 PT Time Calculation (min) (ACUTE ONLY): 38 min   Charges:   PT Evaluation $PT Eval Moderate Complexity: 1 Procedure PT Treatments $Therapeutic Exercise: 8-22 mins   PT G CodesRayetta Sweeney, PT CLT [phone] 01/09/2016, 10:39 AM

## 2016-01-09 NOTE — Progress Notes (Signed)
[place] and report on patient given to Janace Aris, RN. Patient is transferring to room# 335.

## 2016-01-09 NOTE — Progress Notes (Signed)
Subjective: He is much better this morning. Much less confusion he is able to talk to me he is requesting the telephone. No obvious seizure activity has been noted. This is over the last 24 hours. He denies chest pain shortness of breath cough or congestion  Objective: Vital signs in last 24 hours: Temp:  [97.1 F (36.2 C)-98.7 F (37.1 C)] 98 F (36.7 C) (01/02 0400) Pulse Rate:  [59] 59 (01/01 0900) Resp:  [13] 13 (01/01 0900) BP: (113)/(56) 113/56 (01/01 0900) SpO2:  [95 %] 95 % (01/01 0900) Weight:  [82.4 kg (181 lb 10.5 oz)] 82.4 kg (181 lb 10.5 oz) (01/02 0500) Weight change: 0.8 kg (1 lb 12.2 oz) Last BM Date: 01/08/16  Intake/Output from previous day: 01/01 0701 - 01/02 0700 In: 1687.5 [P.O.:480; I.V.:1000; IV Piggyback:207.5] Out: 3700 [Urine:3700]  PHYSICAL EXAM General appearance: alert, cooperative and mild distress Resp: rhonchi bilaterally Cardio: regular rate and rhythm, S1, S2 normal, no murmur, click, rub or gallop GI: soft, non-tender; bowel sounds normal; no masses,  no organomegaly Extremities: extremities normal, atraumatic, no cyanosis or edema Skin warm and dry. Pupils react. Mucous membranes are still somewhat dry  Lab Results:  Results for orders placed or performed during the hospital encounter of 01/03/16 (from the past 48 hour(s))  Valproic acid level     Status: None   Collection Time: 01/07/16  9:28 AM  Result Value Ref Range   Valproic Acid Lvl 90 50.0 - 100.0 ug/mL  Basic metabolic panel     Status: Abnormal   Collection Time: 01/08/16  4:56 AM  Result Value Ref Range   Sodium 140 135 - 145 mmol/L   Potassium 3.1 (L) 3.5 - 5.1 mmol/L   Chloride 101 101 - 111 mmol/L   CO2 32 22 - 32 mmol/L   Glucose, Bld 104 (H) 65 - 99 mg/dL   BUN 11 6 - 20 mg/dL   Creatinine, Ser 2.42 0.61 - 1.24 mg/dL   Calcium 9.0 8.9 - 44.4 mg/dL   GFR calc non Af Amer >60 >60 mL/min   GFR calc Af Amer >60 >60 mL/min    Comment: (NOTE) The eGFR has been calculated  using the CKD EPI equation. This calculation has not been validated in all clinical situations. eGFR's persistently <60 mL/min signify possible Chronic Kidney Disease.    Anion gap 7 5 - 15    ABGS No results for input(s): PHART, PO2ART, TCO2, HCO3 in the last 72 hours.  Invalid input(s): PCO2 CULTURES Recent Results (from the past 240 hour(s))  Culture, blood (routine x 2)     Status: None (Preliminary result)   Collection Time: 01/04/16  1:50 AM  Result Value Ref Range Status   Specimen Description BLOOD LEFT FOREARM  Final   Special Requests BOTTLES DRAWN AEROBIC AND ANAEROBIC 10CC EACH  Final   Culture NO GROWTH 3 DAYS  Final   Report Status PENDING  Incomplete  Culture, blood (routine x 2)     Status: None (Preliminary result)   Collection Time: 01/04/16  1:58 AM  Result Value Ref Range Status   Specimen Description BLOOD LEFT ANTECUBITAL  Final   Special Requests BOTTLES DRAWN AEROBIC AND ANAEROBIC 10CC EACH  Final   Culture NO GROWTH 3 DAYS  Final   Report Status PENDING  Incomplete  MRSA PCR Screening     Status: Abnormal   Collection Time: 01/04/16  2:04 AM  Result Value Ref Range Status   MRSA by PCR POSITIVE (A)  NEGATIVE Final    Comment:        The GeneXpert MRSA Assay (FDA approved for NASAL specimens only), is one component of a comprehensive MRSA colonization surveillance program. It is not intended to diagnose MRSA infection nor to guide or monitor treatment for MRSA infections. RESULT CALLED TO, READ BACK BY AND VERIFIED WITH: Inocencio Homes AT 0401 ON 197588 BY FORSYTH K    Studies/Results: No results found.  Medications:  Prior to Admission:  Prescriptions Prior to Admission  Medication Sig Dispense Refill Last Dose  . albuterol (PROVENTIL HFA) 108 (90 BASE) MCG/ACT inhaler Inhale 2 puffs into the lungs every 6 (six) hours as needed for wheezing or shortness of breath.   01/03/2016 at Unknown time  . ALPRAZolam (XANAX) 1 MG tablet Take 1 mg by mouth 4  (four) times daily as needed for anxiety.   01/03/2016 at Unknown time  . atorvastatin (LIPITOR) 40 MG tablet Take 40 mg by mouth at bedtime.    01/03/2016 at Unknown time  . midodrine (PROAMATINE) 5 MG tablet Take 1 tablet (5 mg total) by mouth 3 (three) times daily with meals.   01/03/2016 at Unknown time  . nitroGLYCERIN (NITROSTAT) 0.4 MG SL tablet Place 0.4 mg under the tongue every 5 (five) minutes as needed for chest pain.    Past Month at Unknown time  . pantoprazole (PROTONIX) 40 MG tablet Take 40 mg by mouth daily.     01/03/2016 at Unknown time  . valproic acid (DEPAKENE) 250 MG capsule Take 250 mg by mouth 3 (three) times daily.   01/03/2016 at Unknown time   Scheduled: . enoxaparin (LOVENOX) injection  40 mg Subcutaneous Q24H  . famotidine (PEPCID) IV  20 mg Intravenous Q12H  . levETIRAcetam  1,500 mg Intravenous Q12H  . potassium chloride  20 mEq Oral BID  . valproate sodium  750 mg Intravenous Q8H   Continuous: . dextrose 5 % and 0.9% NaCl 100 mL/hr at 01/09/16 0106   TGP:QDIYMEBRAXENM, LORazepam  Assesment:He was admitted with atypical seizures altered mental status likely post ictal and complex partial status epilepticus. His seizures are from a previous stroke. He also has coronary disease but is not having any chest pain. He has COPD but his breathing seems to be doing okay. He has hypertension and his blood pressure is pretty well controlled. His potassium was low yesterday he is on replacement and that will be rechecked tomorrow. At baseline he has pretty significant orthostasis and he has severe anxiety and depression/bipolar disease. Principal Problem:   Seizure disorder [facility]) Active Problems:   Hyperlipidemia   ANXIETY DEPRESSION   Essential hypertension   Coronary atherosclerosis   Stroke [facility])   GERD    Plan: Transfer out of ICU. Continue with oral anticonvulsants.    LOS: 6 days   Marco Adelson L 01/09/2016, 7:46 AM

## 2016-01-09 NOTE — Progress Notes (Signed)
[place] A. Merlene Laughter, MD     [email or web address]          Jeffrey Sweeney is an 71 y.o. male.   ASSESSMENT/PLAN:     UPDATE  Complex partial status epilepticus - This appears to have resolved. I will check a Depakote level. We will continue with the current high doses of both Keppra and Depakote for now. An EEG unfortunate cannot be done but one is still pending.    The patient was noted to be quite drowsy throughout the weekend. The dose of Depakote was increased. It appears the patient has improved significantly this morning however.   GENERAL: Patient lays in bed and more responsive tonight.  HEENT: Supple. Atraumatic normocephalic - no oral trauma appreciated.   ABDOMEN: soft  EXTREMITIES: No edema   BACK: Normal.  SKIN: Normal by inspection.    MENTAL STATUS: he is awake and alert. I see no episodes of deviation of the head her eyes to the left as he has had repeatedly in the past. He knows that he he is in the hospital at [facility] and that is 2018. He follows commands well.   CRANIAL NERVES: Pupils are equal, round and reactive to light; extra ocular movements are full, there is no significant nystagmus; visual fields are limited but appears to be full; upper and lower facial muscles are normal in strength and symmetric, there is no flattening of the nasolabial folds; tongue is midline.  MOTOR: The patient moves both sides well without clear focality. Bulk and tone are normal throughout.  COORDINATION: There is no evidence of dysmetria or tremors. No parkinsonism.  REFLEXES: Deep tendon reflexes are symmetrical and normal.   SENSATION: He responds to pain bilaterally.        Blood pressure (!) 113/56, pulse (!) 59, temperature 98 F (36.7 C), temperature source Axillary, resp. rate 13, height _0  (1.702 m), weight 181 lb 10.5 oz (82.4 kg), SpO2 95 %.  Past Medical History:  Diagnosis Date  . AF (atrial fibrillation) ([place])   .  Bipolar disorder ([place])   . CAD (coronary artery disease)   . COPD (chronic obstructive pulmonary disease) ([place])   . Depression   . Headache   . Heart disease   . Hypertension   . Seizures ([place])   . Stroke [facility])    left arm weakness    Past Surgical History:  Procedure Laterality Date  . ANKLE FRACTURE SURGERY      Family History  Problem Relation Age of Onset  . Cancer Mother   . Cancer Father     Social History:  reports that he quit smoking about 2 years ago. His smoking use included Cigarettes. He has a 25.00 pack-year smoking history. He has never used smokeless tobacco. He reports that he does not drink alcohol or use drugs.  Allergies:  Allergies  Allergen Reactions  . Meperidine Hcl Nausea And Vomiting    Medications: Prior to Admission medications   Medication Sig Start Date End Date Taking? Authorizing Provider  albuterol (PROVENTIL HFA) 108 (90 BASE) MCG/ACT inhaler Inhale 2 puffs into the lungs every 6 (six) hours as needed for wheezing or shortness of breath.   Yes Historical Provider, MD  ALPRAZolam Duanne Moron) 1 MG tablet Take 1 mg by mouth 4 (four) times daily as needed for anxiety.   Yes Historical Provider, MD  atorvastatin (LIPITOR) 40 MG tablet Take 40 mg by mouth at bedtime.    Yes Historical Provider, MD  midodrine (PROAMATINE) 5 MG tablet Take 1 tablet (5 mg total) by mouth 3 (three) times daily with meals. 01/07/15  Yes Sinda Du, MD  nitroGLYCERIN (NITROSTAT) 0.4 MG SL tablet Place 0.4 mg under the tongue every 5 (five) minutes as needed for chest pain.    Yes Historical Provider, MD  pantoprazole (PROTONIX) 40 MG tablet Take 40 mg by mouth daily.     Yes Historical Provider, MD  valproic acid (DEPAKENE) 250 MG capsule Take 250 mg by mouth 3 (three) times daily.   Yes Historical Provider, MD    Scheduled Meds: . enoxaparin (LOVENOX) injection  40 mg Subcutaneous Q24H  . levETIRAcetam  1,500 mg Intravenous Q12H  . pantoprazole  40 mg Oral Q1200    . potassium chloride  20 mEq Oral BID  . valproate sodium  750 mg Intravenous Q8H   Continuous Infusions: . dextrose 5 % and 0.9% NaCl 100 mL/hr at 01/09/16 0106   PRN Meds:.acetaminophen, LORazepam     Results for orders placed or performed during the hospital encounter of 01/03/16 (from the past 48 hour(s))  Valproic acid level     Status: None   Collection Time: 01/07/16  9:28 AM  Result Value Ref Range   Valproic Acid Lvl 90 50.0 - 100.0 ug/mL  Basic metabolic panel     Status: Abnormal   Collection Time: 01/08/16  4:56 AM  Result Value Ref Range   Sodium 140 135 - 145 mmol/L   Potassium 3.1 (L) 3.5 - 5.1 mmol/L   Chloride 101 101 - 111 mmol/L   CO2 32 22 - 32 mmol/L   Glucose, Bld 104 (H) 65 - 99 mg/dL   BUN 11 6 - 20 mg/dL   Creatinine, Ser 0.78 0.61 - 1.24 mg/dL   Calcium 9.0 8.9 - 10.3 mg/dL   GFR calc non Af Amer >60 >60 mL/min   GFR calc Af Amer >60 >60 mL/min    Comment: (NOTE) The eGFR has been calculated using the CKD EPI equation. This calculation has not been validated in all clinical situations. eGFR's persistently <60 mL/min signify possible Chronic Kidney Disease.    Anion gap 7 5 - 15    Studies/Results:   BRAIN MRI Motion and susceptibility artifact limits diagnostic accuracy. Small or subtle lesions could be overlooked.  Brain: No evidence for acute infarction, hemorrhage, mass lesion, or extra-axial fluid. Generalized atrophy. Chronic microvascular ischemic change. Large remote RIGHT MCA territory infarct, affecting the frontal, parietal, and temporal lobes as well as the insula and basal ganglia. Small chronic LEFT cerebellar infarcts. Hydrocephalus ex vacuo.  Vascular: Flow voids are maintained. Chronic hemorrhage accompanies the old RIGHT-sided infarct, with hemosiderin standing of the gyri as well as deep within the area of encephalomalacia. This localized superficial siderosis could contribute to focal or  generalized seizures.  Skull and upper cervical spine: Normal marrow signal.  Sinuses/Orbits: Chronically inspissated secretions and atelectasis of the LEFT maxillary sinus. Mild mucosal thickening elsewhere.  Other: None.  IMPRESSION: Markedly motion degraded study demonstrating no acute intracranial findings. Specifically, no acute RIGHT hemisphere infarct or gyriform restricted diffusion.  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         The brain MRI scan is reviewed in person. There is a large area of encephalomalacia involving the right parietal temporal area. This is associated with marked white matter increased signal on FLAIR and T2. Nothing acute is seen on DWI. There is evidence of  petechial hemorrhage/chronic hemorrhage on SWI involving this area. There is moderate confluent periventricular  leukoencephalopathy.      Tyquavious Gamel A. Merlene Laughter, M.D.  Diplomate, Tax adviser of Psychiatry and Neurology ( Neurology). 01/09/2016, 7:50 AM

## 2016-01-09 NOTE — Progress Notes (Signed)
0806 Patient's breakfast tray set up for patient. Mitts removed from patients hands and patient sitting up in bed feeding himself w/o difficulty at this time. Patient noted talking more and eager to eat. Good appetite noted this morning. New order to transfer patient to Dept 300 telemetry, bed request sent.

## 2016-01-09 NOTE — Clinical Social Work Note (Signed)
Clinical Social Work Assessment  Patient Details  Name: Jeffrey Sweeney MRN: [id number] Date of Birth: 1945-06-24  Date of referral:  01/09/16               Reason for consult:  Discharge Planning                Permission sought to share information with:    Permission granted to share information::     Name::        Agency::     Relationship::     Contact Information:     Housing/Transportation Living arrangements for the past 2 months:  Single Family Home Source of Information:  Patient Patient Interpreter Needed:  None Criminal Activity/Legal Involvement Pertinent to Current Situation/Hospitalization:  No - Comment as needed Significant Relationships:  Adult Children Lives with:  Self Do you feel safe going back to the place where you live?  No Need for family participation in patient care:  No (Coment)  Care giving concerns:  None identified by patient.    Social Worker assessment / plan:  Patient stated that he has participated in rehab in the past. He stated that he does not desire to go to rehab and that he would prefer it at home. [place] stated "I feel like I can take my medications and pretty well look after myself." Patient stated "not really" when asked if he had family support. [name] discussed PT recommendation. Patient continued to indicated that he did not desire to go to SNF for rehab.  [name] signing off.  Employment status:  Part-Time Nurse, adult PT Recommendations:  [place] / Referral to community resources:  [place]  Patient/Family's Response to care:  Patient is not agreeable to go to SNF.   Patient/Family's Understanding of and Emotional Response to Diagnosis, Current Treatment, and Prognosis:  Patient understands his diagnosis, treatment and prognosis.   Emotional Assessment Appearance:  Appears stated age Attitude/Demeanor/Rapport:   (Cooperative) Affect (typically observed):   Calm Orientation:  Oriented to Self, Oriented to Place, Oriented to  Time, Oriented to Situation Alcohol / Substance use:  Not Applicable Psych involvement (Current and /or in the community):  No (Comment)  Discharge Needs  Concerns to be addressed:  No discharge needs identified Readmission within the last 30 days:  No Current discharge risk:  Lives alone Barriers to Discharge:  No Barriers Identified   Ihor Gully, LCSW 01/09/2016, 1:34 PM

## 2016-01-09 NOTE — Progress Notes (Signed)
EEG Completed; Results Pending  

## 2016-01-10 DIAGNOSIS — R509 Fever, unspecified: Secondary | ICD-10-CM | POA: Diagnosis not present

## 2016-01-10 DIAGNOSIS — R279 Unspecified lack of coordination: Secondary | ICD-10-CM | POA: Diagnosis not present

## 2016-01-10 DIAGNOSIS — G4089 Other seizures: Secondary | ICD-10-CM | POA: Diagnosis not present

## 2016-01-10 DIAGNOSIS — F319 Bipolar disorder, unspecified: Secondary | ICD-10-CM | POA: Diagnosis not present

## 2016-01-10 DIAGNOSIS — E784 Other hyperlipidemia: Secondary | ICD-10-CM | POA: Diagnosis not present

## 2016-01-10 DIAGNOSIS — R569 Unspecified convulsions: Secondary | ICD-10-CM | POA: Diagnosis not present

## 2016-01-10 DIAGNOSIS — J449 Chronic obstructive pulmonary disease, unspecified: Secondary | ICD-10-CM | POA: Diagnosis not present

## 2016-01-10 DIAGNOSIS — I10 Essential (primary) hypertension: Secondary | ICD-10-CM | POA: Diagnosis not present

## 2016-01-10 DIAGNOSIS — I482 Chronic atrial fibrillation: Secondary | ICD-10-CM | POA: Diagnosis not present

## 2016-01-10 DIAGNOSIS — J189 Pneumonia, unspecified organism: Secondary | ICD-10-CM | POA: Diagnosis not present

## 2016-01-10 DIAGNOSIS — Z7401 Bed confinement status: Secondary | ICD-10-CM | POA: Diagnosis not present

## 2016-01-10 DIAGNOSIS — R4182 Altered mental status, unspecified: Secondary | ICD-10-CM | POA: Diagnosis not present

## 2016-01-10 DIAGNOSIS — I2510 Atherosclerotic heart disease of native coronary artery without angina pectoris: Secondary | ICD-10-CM | POA: Diagnosis not present

## 2016-01-10 DIAGNOSIS — Z23 Encounter for immunization: Secondary | ICD-10-CM | POA: Diagnosis not present

## 2016-01-10 DIAGNOSIS — E7800 Pure hypercholesterolemia, unspecified: Secondary | ICD-10-CM | POA: Diagnosis not present

## 2016-01-10 DIAGNOSIS — K219 Gastro-esophageal reflux disease without esophagitis: Secondary | ICD-10-CM | POA: Diagnosis not present

## 2016-01-10 DIAGNOSIS — E038 Other specified hypothyroidism: Secondary | ICD-10-CM | POA: Diagnosis not present

## 2016-01-10 LAB — LEVETIRACETAM LEVEL: Levetiracetam Lvl: 28.4 ug/mL (ref 10.0–40.0)

## 2016-01-10 LAB — BASIC METABOLIC PANEL
Anion gap: 5 (ref 5–15)
BUN: 11 mg/dL (ref 6–20)
CHLORIDE: 103 mmol/L (ref 101–111)
CO2: 32 mmol/L (ref 22–32)
CREATININE: 0.7 mg/dL (ref 0.61–1.24)
Calcium: 8.6 mg/dL — ABNORMAL LOW (ref 8.9–10.3)
GFR calc Af Amer: >60 mL/min (ref >60)
GFR calc non Af Amer: >60 mL/min (ref >60)
GLUCOSE: 86 mg/dL (ref 65–99)
POTASSIUM: 3.4 mmol/L — AB (ref 3.5–5.1)
SODIUM: 140 mmol/L (ref 135–145)

## 2016-01-10 MED ORDER — VALPROIC ACID 250 MG PO CAPS
750.0000 mg | ORAL_CAPSULE | Freq: Three times a day (TID) | ORAL | Status: DC
  Filled 2016-01-10 (×6): qty 3

## 2016-01-10 MED ORDER — LEVETIRACETAM 750 MG PO TABS: 1500.0000 mg | ORAL_TABLET | Freq: Two times a day (BID) | ORAL | Status: DC

## 2016-01-10 MED ORDER — LEVETIRACETAM 500 MG PO TABS
750.0000 mg | ORAL_TABLET | Freq: Two times a day (BID) | ORAL | Status: DC
  Administered 2016-01-10: 11:00:00 750 mg via ORAL
  Filled 2016-01-10: qty 1

## 2016-01-10 MED ORDER — DIVALPROEX SODIUM 250 MG PO DR TAB: 750.0000 mg | DELAYED_RELEASE_TABLET | Freq: Three times a day (TID) | ORAL | Status: DC

## 2016-01-10 NOTE — Procedures (Signed)
  [place] A. 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   MEDICATIONS: Scheduled Meds: . enoxaparin (LOVENOX) injection  40 mg Subcutaneous Q24H  . levETIRAcetam  1,500 mg Intravenous Q12H  . pantoprazole  40 mg Oral Q1200  . potassium chloride  20 mEq Oral BID  . valproate sodium  750 mg Intravenous Q8H   Continuous Infusions: . dextrose 5 % and 0.9% NaCl 100 mL/hr at 01/09/16 0106   PRN Meds:.acetaminophen, LORazepam  Prior to Admission medications   Medication Sig Start Date End Date Taking? Authorizing Provider  albuterol (PROVENTIL HFA) 108 (90 BASE) MCG/ACT inhaler Inhale 2 puffs into the lungs every 6 (six) hours as needed for wheezing or shortness of breath.   Yes Historical Provider, MD  ALPRAZolam Duanne Moron) 1 MG tablet Take 1 mg by mouth 4 (four) times daily as needed for anxiety.   Yes Historical Provider, MD  atorvastatin (LIPITOR) 40 MG tablet Take 40 mg by mouth at bedtime.    Yes Historical Provider, MD  midodrine (PROAMATINE) 5 MG tablet Take 1 tablet (5 mg total) by mouth 3 (three) times daily with meals. 01/07/15  Yes Sinda Du, MD  nitroGLYCERIN (NITROSTAT) 0.4 MG SL tablet Place 0.4 mg under the tongue every 5 (five) minutes as needed for chest pain.    Yes Historical Provider, MD  pantoprazole (PROTONIX) 40 MG tablet Take 40 mg by mouth daily.     Yes Historical Provider, MD  valproic acid (DEPAKENE) 250 MG capsule Take 250 mg by mouth 3 (three) times daily.   Yes Historical Provider, MD      ANALYSIS: A 16 channel recording using standard 10 20 measurements is conducted for 20 minutes. The background activity gets as high as 7 Hz. However, the recording shows multiple episodes of generalized delta slowing. Beta activity is observed in the frontal areas. Awake and drowsy activities are observed. There are  several episodes of sharp wave activity that phase reverses at T5. No clear ongoing episodes of electrographic seizures are observed however. Photic stimulation and hyperventilation not conducted.    IMPRESSION: This recording is abnormal for the following reasons: 1. Multiple episodes of sharp wave activity/epileptiform activity involving the left frontal areas but no clear evidence of ongoing electrographic seizures. 2. Moderate global slowing.      Nijel Flink A. Merlene Laughter, M.D.  Diplomate, Tax adviser of Psychiatry and Neurology ( Neurology).

## 2016-01-10 NOTE — Clinical Social Work Note (Addendum)
[name] spoke with pt's sister, [name] who states she can pick up pt around 4:00 today.   Authorization received: [id number]  Next review 01/12/16. RUG level [facility]. [facility] updated.   Update: [name] and sister request transport via [facility].   Benay Pike, [place]

## 2016-01-10 NOTE — NC FL2 (Signed)
[employer] LEVEL OF CARE SCREENING TOOL     IDENTIFICATION  Patient Name: Jeffrey Sweeney Birthdate: [birth year] Sex: male Admission Date (Current Location): 01/03/2016  [facility] and [place] Number:  [employer] and Address:  [place] [address], [place]      Provider Number: [id number]  Attending Physician Name and Address:  Sinda Du, MD  Relative Name and Phone Number:       Current Level of Care: Hospital Recommended Level of Care: [place] Prior Approval Number:    Date Approved/Denied:   PASRR Number: [id number]  DATES 01/10/2016-02/09/2016  Discharge Plan: SNF    Current Diagnoses: Patient Active Problem List   Diagnosis Date Noted  . Status epilepticus ([place]) 01/09/2016  . HCAP (healthcare-associated pneumonia) 01/11/2015  . Fever 01/10/2015  . Osteoporosis 01/07/2015  . Compression fracture of lumbar spine, non-traumatic ([place]) 01/07/2015  . COPD exacerbation ([place]) 01/05/2015  . Falls 01/05/2015  . Hypokalemia 08/14/2011  . DVT (deep venous thrombosis) ([place]) 08/14/2011  . Atrial fibrillation ([place]) 08/13/2011  . Rib fractures 08/07/2011  . Respiratory distress 08/07/2011  . COPD (chronic obstructive pulmonary disease) ([place]) 08/07/2011  . Atelectasis 08/07/2011  . H/O chest tube placement 08/07/2011  . Hemothorax 08/07/2011  . Hyperlipidemia 07/17/2010  . ANXIETY DEPRESSION 07/17/2010  . Essential hypertension 07/17/2010  . Coronary atherosclerosis 07/17/2010  . CORONARY ATHEROSCLEROSIS NATIVE CORONARY ARTERY 07/17/2010  . Stroke ([place]) 07/17/2010  . CEREBROVASCULAR DISEASE 07/17/2010  . GERD 07/17/2010  . Seizure disorder ([place]) 07/17/2010  . SPLENIC INFARCTION 07/05/2009  . EMBOLISM&THROMBOSIS OF OTHER SPECIFIED ARTERY 07/05/2009  . EARLY SATIETY 07/05/2009  . WEIGHT LOSS, ABNORMAL 07/05/2009  . DIARRHEA 07/05/2009  . LUQ PAIN 07/05/2009  . PERSONAL HISTORY OF COLONIC POLYPS  07/05/2009  . Orthostatic hypotension 05/01/2009  . SYNCOPE 05/01/2009  . CHEST PAIN 05/01/2009  . LOW BACK PAIN, MILD 04/20/2009  . Bipolar 1 disorder, mixed, moderate ([place]) 04/20/2009    Orientation RESPIRATION BLADDER Height & Weight     Self  Normal Continent Weight: 181 lb 10.5 oz (82.4 kg) Height:  5\' 7"  (170.2 cm)  BEHAVIORAL SYMPTOMS/MOOD NEUROLOGICAL BOWEL NUTRITION STATUS    Convulsions/Seizures Continent Diet (DIET DYS 2. 100% Supervision wiht all meals due ot risk of seizure. Pt must be alert and upright for all eating/drinking. PO meds ok whole in puree or with water)  AMBULATORY STATUS COMMUNICATION OF NEEDS Skin   Limited Assist Verbally Normal                       Personal Care Assistance Level of Assistance  Bathing, Feeding, Dressing Bathing Assistance: Limited assistance Feeding assistance: Independent Dressing Assistance: Limited assistance     Functional Limitations Info  Sight, Hearing, Speech Sight Info: Adequate Hearing Info: Adequate Speech Info: Adequate    SPECIAL CARE FACTORS FREQUENCY  PT (By licensed PT), Speech therapy     PT Frequency: 5x/week       Speech Therapy Frequency: 3x/week      Contractures Contractures Info: Not present    Additional Factors Info  Allergies, Isolation Precautions   Allergies Info: Meperidine Hcl     Isolation Precautions Info: MRSA by PCR 01/04/16     Current Medications (01/10/2016):  This is the current hospital active medication list Current Facility-Administered Medications  Medication Dose Route Frequency Provider Last Rate Last Dose  . acetaminophen (TYLENOL) suppository 650 mg  650 mg Rectal Q4H PRN Shanon Brow  Judieth Keens, MD   650 mg at 01/04/16 0759  . dextrose 5 %-0.9 % sodium chloride infusion   Intravenous Continuous Reubin Milan, MD 100 mL/hr at 01/09/16 0106    . enoxaparin (LOVENOX) injection 40 mg  40 mg Subcutaneous Q24H Reubin Milan, MD   40 mg at 01/09/16 1110  .  levETIRAcetam (KEPPRA) tablet 750 mg  750 mg Oral BID Sinda Du, MD      . LORazepam (ATIVAN) injection 1 mg  1 mg Intravenous Q4H PRN Reubin Milan, MD   1 mg at 01/08/16 0004  . pantoprazole (PROTONIX) EC tablet 40 mg  40 mg Oral Q1200 Sinda Du, MD   40 mg at 01/09/16 1110  . potassium chloride SA (K-DUR,KLOR-CON) CR tablet 20 mEq  20 mEq Oral BID Sinda Du, MD   20 mEq at 01/09/16 2137  . valproic acid (DEPAKENE) 250 MG capsule 750 mg  750 mg Oral TID Sinda Du, MD         Discharge Medications: Please see discharge summary for a list of discharge medications.  Relevant Imaging Results:  Relevant Lab Results:   Additional Information SS#: [id number]  Ihor Gully, LCSW

## 2016-01-10 NOTE — Progress Notes (Signed)
[place] A. Merlene Laughter, MD     [email or web address]          Jeffrey Sweeney is an 71 y.o. male.   ASSESSMENT/PLAN:     UPDATE  Complex partial status epilepticus - This appears to have resolved. I will switch SZ to PO   NO Clinical Sz.    GENERAL: Patient lays in bed and more responsive tonight.  HEENT: Supple. Atraumatic normocephalic - no oral trauma appreciated.   ABDOMEN: soft  EXTREMITIES: No edema   BACK: Normal.  SKIN: Normal by inspection.    MENTAL STATUS: he is awake and alert. I see no episodes of deviation of the head her eyes to the left as he has had repeatedly in the past. He knows that he he is in [place] but this he is at an apartment; knows it's Jan 2018. He follows commands well.   CRANIAL NERVES: Pupils are equal, round and reactive to light; extra ocular movements are full, there is no significant nystagmus; visual fields are limited but appears to be full; upper and lower facial muscles are normal in strength and symmetric, there is no flattening of the nasolabial folds; tongue is midline.  MOTOR: The patient moves both sides well without clear focality. Bulk and tone are normal throughout.  COORDINATION: There is no evidence of dysmetria or tremors. No parkinsonism.  REFLEXES: Deep tendon reflexes are symmetrical and normal.   SENSATION: He responds to pain bilaterally.        Blood pressure 117/64, pulse (!) 52, temperature 98.3 F (36.8 C), temperature source Oral, resp. rate 18, height 5\' 7"  (1.702 m), weight 181 lb 10.5 oz (82.4 kg), SpO2 97 %.  Past Medical History:  Diagnosis Date  . AF (atrial fibrillation) ([place])   . Bipolar disorder ([place])   . CAD (coronary artery disease)   . COPD (chronic obstructive pulmonary disease) ([place])   . Depression   . Headache   . Heart disease   . Hypertension   . Seizures ([place])   . Stroke [facility])    left arm weakness    Past Surgical History:  Procedure Laterality Date   . ANKLE FRACTURE SURGERY      Family History  Problem Relation Age of Onset  . Cancer Mother   . Cancer Father     Social History:  reports that he quit smoking about 2 years ago. His smoking use included Cigarettes. He has a 25.00 pack-year smoking history. He has never used smokeless tobacco. He reports that he does not drink alcohol or use drugs.  Allergies:  Allergies  Allergen Reactions  . Meperidine Hcl Nausea And Vomiting    Medications: Prior to Admission medications   Medication Sig Start Date End Date Taking? Authorizing Provider  albuterol (PROVENTIL HFA) 108 (90 BASE) MCG/ACT inhaler Inhale 2 puffs into the lungs every 6 (six) hours as needed for wheezing or shortness of breath.   Yes Historical Provider, MD  ALPRAZolam Duanne Moron) 1 MG tablet Take 1 mg by mouth 4 (four) times daily as needed for anxiety.   Yes Historical Provider, MD  atorvastatin (LIPITOR) 40 MG tablet Take 40 mg by mouth at bedtime.    Yes Historical Provider, MD  midodrine (PROAMATINE) 5 MG tablet Take 1 tablet (5 mg total) by mouth 3 (three) times daily with meals. 01/07/15  Yes Sinda Du, MD  nitroGLYCERIN (NITROSTAT) 0.4 MG SL tablet Place 0.4 mg under the tongue every 5 (five) minutes as needed for chest pain.  Yes Historical Provider, MD  pantoprazole (PROTONIX) 40 MG tablet Take 40 mg by mouth daily.     Yes Historical Provider, MD  valproic acid (DEPAKENE) 250 MG capsule Take 250 mg by mouth 3 (three) times daily.   Yes Historical Provider, MD    Scheduled Meds: . enoxaparin (LOVENOX) injection  40 mg Subcutaneous Q24H  . levETIRAcetam  1,500 mg Intravenous Q12H  . pantoprazole  40 mg Oral Q1200  . potassium chloride  20 mEq Oral BID  . valproate sodium  750 mg Intravenous Q8H   Continuous Infusions: . dextrose 5 % and 0.9% NaCl 100 mL/hr at 01/09/16 0106   PRN Meds:.acetaminophen, LORazepam     Results for orders placed or performed during the hospital encounter of 01/03/16  (from the past 48 hour(s))  Valproic acid level     Status: None   Collection Time: 01/09/16 10:28 PM  Result Value Ref Range   Valproic Acid Lvl 77 50.0 - 100.0 ug/mL    Studies/Results:   BRAIN MRI Motion and susceptibility artifact limits diagnostic accuracy. Small or subtle lesions could be overlooked.  Brain: No evidence for acute infarction, hemorrhage, mass lesion, or extra-axial fluid. Generalized atrophy. Chronic microvascular ischemic change. Large remote RIGHT MCA territory infarct, affecting the frontal, parietal, and temporal lobes as well as the insula and basal ganglia. Small chronic LEFT cerebellar infarcts. Hydrocephalus ex vacuo.  Vascular: Flow voids are maintained. Chronic hemorrhage accompanies the old RIGHT-sided infarct, with hemosiderin standing of the gyri as well as deep within the area of encephalomalacia. This localized superficial siderosis could contribute to focal or generalized seizures.  Skull and upper cervical spine: Normal marrow signal.  Sinuses/Orbits: Chronically inspissated secretions and atelectasis of the LEFT maxillary sinus. Mild mucosal thickening elsewhere.  Other: None.  IMPRESSION: Markedly motion degraded study demonstrating no acute intracranial findings. Specifically, no acute RIGHT hemisphere infarct or gyriform restricted diffusion.  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         The brain MRI scan is reviewed in person. There is a large area of encephalomalacia involving the right parietal temporal area. This is associated with marked white matter increased signal on FLAIR and T2. Nothing acute is seen on DWI. There is evidence of petechial hemorrhage/chronic hemorrhage on SWI involving this area. There is moderate confluent periventricular  leukoencephalopathy.      Sohil Timko A. Merlene Laughter, M.D.  Diplomate, Tax adviser of Psychiatry and  Neurology ( Neurology). 01/10/2016, 8:47 AM

## 2016-01-10 NOTE — Progress Notes (Signed)
Foley d/c'd at 1300. Pt tolerated the procedure well.

## 2016-01-10 NOTE — Discharge Summary (Signed)
Physician Discharge Summary  Patient ID: Jeffrey Sweeney MRN: [id number] DOB/AGE: Feb 07, 1945 71 y.o. Primary Care Physician:Yvonnie Schinke L, MD Admit date: 01/03/2016 Discharge date: 01/10/2016    Discharge Diagnoses:   Principal Problem:   Seizure disorder [facility]) Active Problems:   Hyperlipidemia   ANXIETY DEPRESSION   Essential hypertension   Coronary atherosclerosis   Stroke (HCC)   GERD   Bipolar 1 disorder, mixed, moderate (HCC)   COPD (chronic obstructive pulmonary disease) (HCC)   Hypokalemia   Status epilepticus (HCC)   Allergies as of 01/10/2016      Reactions   Meperidine Hcl Nausea And Vomiting      Medication List    STOP taking these medications   valproic acid 250 MG capsule Commonly known as:  DEPAKENE     TAKE these medications   ALPRAZolam 1 MG tablet Commonly known as:  XANAX Take 1 mg by mouth 4 (four) times daily as needed for anxiety.   atorvastatin 40 MG tablet Commonly known as:  LIPITOR Take 40 mg by mouth at bedtime.   divalproex 250 MG DR tablet Commonly known as:  DEPAKOTE Take 3 tablets (750 mg total) by mouth 3 (three) times daily.   levETIRAcetam 750 MG tablet Commonly known as:  KEPPRA Take 2 tablets (1,500 mg total) by mouth 2 (two) times daily.   midodrine 5 MG tablet Commonly known as:  PROAMATINE Take 1 tablet (5 mg total) by mouth 3 (three) times daily with meals.   nitroGLYCERIN 0.4 MG SL tablet Commonly known as:  NITROSTAT Place 0.4 mg under the tongue every 5 (five) minutes as needed for chest pain.   pantoprazole 40 MG tablet Commonly known as:  PROTONIX Take 40 mg by mouth daily.   PROVENTIL HFA 108 (90 Base) MCG/ACT inhaler Generic drug:  albuterol Inhale 2 puffs into the lungs every 6 (six) hours as needed for wheezing or shortness of breath.       Discharged Condition:Improved    Consults: Neurology  Significant Diagnostic Studies: Mr Brain Wo Contrast  Result Date: 01/04/2016 CLINICAL DATA:   Unresponsive. Altered mental status. Found down by son, nonverbal and confused. History of stroke, hypertension, and seizures. LEFT-sided weakness is reported. EXAM: MRI HEAD WITHOUT CONTRAST TECHNIQUE: Multiplanar, multiecho pulse sequences of the brain and surrounding structures were obtained without intravenous contrast. COMPARISON:  CT head 01/03/2016.  Most recent MR brain 01/11/2015. FINDINGS: Motion and susceptibility artifact limits diagnostic accuracy. Small or subtle lesions could be overlooked. Brain: No evidence for acute infarction, hemorrhage, mass lesion, or extra-axial fluid. Generalized atrophy. Chronic microvascular ischemic change. Large remote RIGHT MCA territory infarct, affecting the frontal, parietal, and temporal lobes as well as the insula and basal ganglia. Small chronic LEFT cerebellar infarcts. Hydrocephalus ex vacuo. Vascular: Flow voids are maintained. Chronic hemorrhage accompanies the old RIGHT-sided infarct, with hemosiderin standing of the gyri as well as deep within the area of encephalomalacia. This localized superficial siderosis could contribute to focal or generalized seizures. Skull and upper cervical spine: Normal marrow signal. Sinuses/Orbits: Chronically inspissated secretions and atelectasis of the LEFT maxillary sinus. Mild mucosal thickening elsewhere. Other: None. IMPRESSION: Markedly motion degraded study demonstrating no acute intracranial findings. Specifically, no acute RIGHT hemisphere infarct or gyriform restricted diffusion. 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 In the appropriate setting, LEFT-sided weakness could result from postictal phenomenon. Electronically Signed   By: Staci Righter M.D.   On: 01/04/2016 16:32   Dg Chest [facility]  1 View  Result Date: 01/03/2016 CLINICAL DATA:  Patient found on floor.  Nonverbal and confused. EXAM: PORTABLE CHEST 1 VIEW COMPARISON:  01/10/2015 CXR  FINDINGS: Stable borderline cardiomegaly with aortic atherosclerosis. Slightly low lung volumes without pneumonic consolidation or CHF. No effusion or pneumothorax. No suspicious osseous abnormality. IMPRESSION: Stable borderline cardiomegaly with aortic atherosclerosis. No acute pulmonary disease. Electronically Signed   By: Ashley Royalty M.D.   On: 01/03/2016 21:33   Ct Head Code Stroke Wo Contrast`  Result Date: 01/03/2016 CLINICAL DATA:  Code stroke. Found down by son, nonverbal and confused. History of stroke, hypertension, seizures. EXAM: CT HEAD WITHOUT CONTRAST TECHNIQUE: Contiguous axial images were obtained from the base of the skull through the vertex without intravenous contrast. COMPARISON:  MRI of the head January 11, 2015 and CT HEAD January 10, 2015 FINDINGS: BRAIN: The ventricles and sulci are normal for age. No intraparenchymal hemorrhage, mass effect nor midline shift. Old small LEFT cerebellar infarcts. RIGHT frontotemporal parietal encephalomalacia involving the RIGHT basal ganglia. Mild ex vacuo dilatation RIGHT lateral ventricle, no hydrocephalus. Patchy white matter changes compatible with mild chronic small vessel ischemic disease. No acute large vascular territory infarcts. No abnormal extra-axial fluid collections. Basal cisterns are patent. VASCULAR: Moderate calcific atherosclerosis of the carotid siphons. SKULL: No skull fracture. No significant scalp soft tissue swelling. SINUSES/ORBITS: Mild maxillary sinus mucosal thickening. The included ocular globes and orbital contents are non-suspicious. OTHER: None. ASPECTS [facility] Stroke Program Early CT Score) - Ganglionic level infarction (caudate, lentiform nuclei, internal capsule, insula, M1-M3 cortex): 7 - Supraganglionic infarction (M4-M6 cortex): 3 Total score (0-10 with 10 being normal): 10 IMPRESSION: 1. No acute intracranial process. Stable examination including old large RIGHT MCA territory infarct and small LEFT cerebellar  infarcts. 2. ASPECTS is 10. Critical Value/emergent results were called by telephone at the time of interpretation on 01/03/2016 at 8:45 pm to Dr. Fredia Sorrow , who verbally acknowledged these results. Electronically Signed   By: Elon Alas M.D.   On: 01/03/2016 20:45    Lab Results: Basic Metabolic Panel:  Recent Labs  01/08/16 0456  NA 140  K 3.1*  CL 101  CO2 32  GLUCOSE 104*  BUN 11  CREATININE 0.78  CALCIUM 9.0   Liver Function Tests: No results for input(s): AST, ALT, ALKPHOS, BILITOT, PROT, ALBUMIN in the last 72 hours.   CBC: No results for input(s): WBC, NEUTROABS, HGB, HCT, MCV, PLT in the last 72 hours.  Recent Results (from the past 240 hour(s))  Culture, blood (routine x 2)     Status: None   Collection Time: 01/04/16  1:50 AM  Result Value Ref Range Status   Specimen Description BLOOD LEFT FOREARM  Final   Special Requests BOTTLES DRAWN AEROBIC AND ANAEROBIC 10CC EACH  Final   Culture NO GROWTH 5 DAYS  Final   Report Status 01/09/2016 FINAL  Final  Culture, blood (routine x 2)     Status: None   Collection Time: 01/04/16  1:58 AM  Result Value Ref Range Status   Specimen Description BLOOD LEFT ANTECUBITAL  Final   Special Requests BOTTLES DRAWN AEROBIC AND ANAEROBIC 10CC EACH  Final   Culture NO GROWTH 5 DAYS  Final   Report Status 01/09/2016 FINAL  Final  MRSA PCR Screening     Status: Abnormal   Collection Time: 01/04/16  2:04 AM  Result Value Ref Range Status   MRSA by PCR POSITIVE (A) NEGATIVE Final    Comment:  The GeneXpert MRSA Assay (FDA approved for NASAL specimens only), is one component of a comprehensive MRSA colonization surveillance program. It is not intended to diagnose MRSA infection nor to guide or monitor treatment for MRSA infections. RESULT CALLED TO, READ BACK BY AND VERIFIED WITH: Inocencio Homes AT [place] [id number] BY [facility] Course: This is a 71 year old who came to the emergency department  because of altered mental status. He has a known history of seizure disorder. It is not clear how long he was unconscious. He had a previous stroke and had workup for stroke but that was negative. It was felt that he had probably had a seizure because of his previous history of seizure disorder. He had atypical status epilepticus with turning his head to the left and inattentiveness. He was treated with Keppra and Depakote and improved. By the time of discharge he was awake and alert. He had PT speech consultation and it was recommended that he go to a skilled care facility. He initially refused but later agreed. Foley catheter had been placed during his status epilepticus and that was removed. His potassium level was slightly low at 3.1 and that was rechecked before discharge.  Discharge Exam: Blood pressure 117/64, pulse (!) 52, temperature 98.3 F (36.8 C), temperature source Oral, resp. rate 18, height 5\' 7"  (1.702 m), weight 82.4 kg (181 lb 10.5 oz), SpO2 97 %. He's awake and alert. His chest is clear. Neurologically intact although he does have hemiparesis from his previous stroke.  Disposition: Discharged to skilled care facility. He is on   dysphagia 2 diet and that will continue. He will continue with medications. He will have PT OT and speech as needed. He may need adjustment in his Keppra and Depakote levels in the skilled care facility.    Signed: Kiarah Eckstein L   01/10/2016, 9:00 AM

## 2016-01-10 NOTE — NC FL2 (Deleted)
[place] LEVEL OF CARE SCREENING TOOL     IDENTIFICATION  Patient Name: Jeffrey Sweeney Birthdate: 19-May-1945 Sex: male Admission Date (Current Location): 01/03/2016  [facility] and [place] Number:  [employer] and Address:  [place] [address] [place]      Provider Number: [id number]  Attending Physician Name and Address:  Sinda Du, MD  Relative Name and Phone Number:       Current Level of Care: Hospital Recommended Level of Care: [place] Prior Approval Number:    Date Approved/Denied:   PASRR Number:    Discharge Plan: SNF    Current Diagnoses: Patient Active Problem List   Diagnosis Date Noted  . Status epilepticus ([place]) 01/09/2016  . HCAP (healthcare-associated pneumonia) 01/11/2015  . Fever 01/10/2015  . Osteoporosis 01/07/2015  . Compression fracture of lumbar spine, non-traumatic ([place]) 01/07/2015  . COPD exacerbation ([place]) 01/05/2015  . Falls 01/05/2015  . Hypokalemia 08/14/2011  . DVT (deep venous thrombosis) ([place]) 08/14/2011  . Atrial fibrillation ([place]) 08/13/2011  . Rib fractures 08/07/2011  . Respiratory distress 08/07/2011  . COPD (chronic obstructive pulmonary disease) ([place]) 08/07/2011  . Atelectasis 08/07/2011  . H/O chest tube placement 08/07/2011  . Hemothorax 08/07/2011  . Hyperlipidemia 07/17/2010  . ANXIETY DEPRESSION 07/17/2010  . Essential hypertension 07/17/2010  . Coronary atherosclerosis 07/17/2010  . CORONARY ATHEROSCLEROSIS NATIVE CORONARY ARTERY 07/17/2010  . Stroke ([place]) 07/17/2010  . CEREBROVASCULAR DISEASE 07/17/2010  . GERD 07/17/2010  . Seizure disorder ([place]) 07/17/2010  . SPLENIC INFARCTION 07/05/2009  . EMBOLISM&THROMBOSIS OF OTHER SPECIFIED ARTERY 07/05/2009  . EARLY SATIETY 07/05/2009  . WEIGHT LOSS, ABNORMAL 07/05/2009  . DIARRHEA 07/05/2009  . LUQ PAIN 07/05/2009  . PERSONAL HISTORY OF COLONIC POLYPS 07/05/2009  . Orthostatic  hypotension 05/01/2009  . SYNCOPE 05/01/2009  . CHEST PAIN 05/01/2009  . LOW BACK PAIN, MILD 04/20/2009  . Bipolar 1 disorder, mixed, moderate ([place]) 04/20/2009    Orientation RESPIRATION BLADDER Height & Weight     Self  Normal Continent Weight: 181 lb 10.5 oz (82.4 kg) Height:  5\' 7"  (170.2 cm)  BEHAVIORAL SYMPTOMS/MOOD NEUROLOGICAL BOWEL NUTRITION STATUS    Convulsions/Seizures Continent Diet (DIET DYS 2. 100% Supervision wiht all meals due ot risk of seizure. Pt must be alert and upright for all eating/drinking. PO meds ok whole in puree or with water)  AMBULATORY STATUS COMMUNICATION OF NEEDS Skin   Limited Assist Verbally Normal                       Personal Care Assistance Level of Assistance  Bathing, Feeding, Dressing Bathing Assistance: Limited assistance Feeding assistance: Independent Dressing Assistance: Limited assistance     Functional Limitations Info  Sight, Hearing, Speech Sight Info: Adequate Hearing Info: Adequate Speech Info: Adequate    SPECIAL CARE FACTORS FREQUENCY  PT (By licensed PT), Speech therapy     PT Frequency: 5x/week       Speech Therapy Frequency: 3x/week      Contractures Contractures Info: Not present    Additional Factors Info  Allergies, Isolation Precautions   Allergies Info: Meperidine Hcl     Isolation Precautions Info: MRSA by PCR 01/04/16     Current Medications (01/10/2016):  This is the current hospital active medication list Current Facility-Administered Medications  Medication Dose Route Frequency Provider Last Rate Last Dose  . acetaminophen (TYLENOL) suppository 650 mg  650 mg Rectal Q4H PRN Reubin Milan,  MD   650 mg at 01/04/16 0759  . dextrose 5 %-0.9 % sodium chloride infusion   Intravenous Continuous Reubin Milan, MD 100 mL/hr at 01/09/16 0106    . enoxaparin (LOVENOX) injection 40 mg  40 mg Subcutaneous Q24H Reubin Milan, MD   40 mg at 01/09/16 1110  . levETIRAcetam (KEPPRA) tablet  750 mg  750 mg Oral BID Sinda Du, MD      . LORazepam (ATIVAN) injection 1 mg  1 mg Intravenous Q4H PRN Reubin Milan, MD   1 mg at 01/08/16 0004  . pantoprazole (PROTONIX) EC tablet 40 mg  40 mg Oral Q1200 Sinda Du, MD   40 mg at 01/09/16 1110  . potassium chloride SA (K-DUR,KLOR-CON) CR tablet 20 mEq  20 mEq Oral BID Sinda Du, MD   20 mEq at 01/09/16 2137  . valproic acid (DEPAKENE) 250 MG capsule 750 mg  750 mg Oral TID Sinda Du, MD         Discharge Medications: Please see discharge summary for a list of discharge medications.  Relevant Imaging Results:  Relevant Lab Results:   Additional Information SS#: [id number]  Ihor Gully, LCSW

## 2016-01-10 NOTE — Progress Notes (Signed)
Subjective: He feels better and is substantially more alert. EEG results noted. His Depakote level was therapeutic. He agrees now to go to rehabilitation. I think that's appropriate. He still has a Foley catheter in place. No further seizure activity.  Objective: Vital signs in last 24 hours: Temp:  [98.3 F (36.8 C)-99 F (37.2 C)] 98.3 F (36.8 C) (01/03 0658) Pulse Rate:  [52-75] 52 (01/03 0658) Resp:  [14-20] 18 (01/03 0658) BP: (110-138)/(53-74) 117/64 (01/03 0658) SpO2:  [95 %-98 %] 97 % (01/03 0658) Weight change:  Last BM Date: 01/08/16  Intake/Output from previous day: 01/02 0701 - 01/03 0700 In: 480 [P.O.:480] Out: 1850 [Urine:1850]  PHYSICAL EXAM General appearance: alert, cooperative and no distress Resp: clear to auscultation bilaterally Cardio: regular rate and rhythm, S1, S2 normal, no murmur, click, rub or gallop GI: soft, non-tender; bowel sounds normal; no masses,  no organomegaly Extremities: extremities normal, atraumatic, no cyanosis or edema Skin warm and dry. Pupils reactive.  Lab Results:  Results for orders placed or performed during the hospital encounter of 01/03/16 (from the past 48 hour(s))  Valproic acid level     Status: None   Collection Time: 01/09/16 10:28 PM  Result Value Ref Range   Valproic Acid Lvl 77 50.0 - 100.0 ug/mL    ABGS No results for input(s): PHART, PO2ART, TCO2, HCO3 in the last 72 hours.  Invalid input(s): PCO2 CULTURES Recent Results (from the past 240 hour(s))  Culture, blood (routine x 2)     Status: None   Collection Time: 01/04/16  1:50 AM  Result Value Ref Range Status   Specimen Description BLOOD LEFT FOREARM  Final   Special Requests BOTTLES DRAWN AEROBIC AND ANAEROBIC 10CC EACH  Final   Culture NO GROWTH 5 DAYS  Final   Report Status 01/09/2016 FINAL  Final  Culture, blood (routine x 2)     Status: None   Collection Time: 01/04/16  1:58 AM  Result Value Ref Range Status   Specimen Description BLOOD LEFT  ANTECUBITAL  Final   Special Requests BOTTLES DRAWN AEROBIC AND ANAEROBIC 10CC EACH  Final   Culture NO GROWTH 5 DAYS  Final   Report Status 01/09/2016 FINAL  Final  MRSA PCR Screening     Status: Abnormal   Collection Time: 01/04/16  2:04 AM  Result Value Ref Range Status   MRSA by PCR POSITIVE (A) NEGATIVE Final    Comment:        The GeneXpert MRSA Assay (FDA approved for NASAL specimens only), is one component of a comprehensive MRSA colonization surveillance program. It is not intended to diagnose MRSA infection nor to guide or monitor treatment for MRSA infections. RESULT CALLED TO, READ BACK BY AND VERIFIED WITH: Inocencio Homes AT 0401 ON [id number] BY FORSYTH K    Studies/Results: No results found.  Medications:  Prior to Admission:  Prescriptions Prior to Admission  Medication Sig Dispense Refill Last Dose  . albuterol (PROVENTIL HFA) 108 (90 BASE) MCG/ACT inhaler Inhale 2 puffs into the lungs every 6 (six) hours as needed for wheezing or shortness of breath.   01/03/2016 at Unknown time  . ALPRAZolam (XANAX) 1 MG tablet Take 1 mg by mouth 4 (four) times daily as needed for anxiety.   01/03/2016 at Unknown time  . atorvastatin (LIPITOR) 40 MG tablet Take 40 mg by mouth at bedtime.    01/03/2016 at Unknown time  . midodrine (PROAMATINE) 5 MG tablet Take 1 tablet (5 mg total) by mouth  3 (three) times daily with meals.   01/03/2016 at Unknown time  . nitroGLYCERIN (NITROSTAT) 0.4 MG SL tablet Place 0.4 mg under the tongue every 5 (five) minutes as needed for chest pain.    Past Month at Unknown time  . pantoprazole (PROTONIX) 40 MG tablet Take 40 mg by mouth daily.     01/03/2016 at Unknown time  . valproic acid (DEPAKENE) 250 MG capsule Take 250 mg by mouth 3 (three) times daily.   01/03/2016 at Unknown time   Scheduled: . enoxaparin (LOVENOX) injection  40 mg Subcutaneous Q24H  . levETIRAcetam  1,500 mg Intravenous Q12H  . pantoprazole  40 mg Oral Q1200  . potassium chloride  20  mEq Oral BID  . valproate sodium  750 mg Intravenous Q8H   Continuous: . dextrose 5 % and 0.9% NaCl 100 mL/hr at 01/09/16 0106   [id number], LORazepam  Assesment:He was admitted with seizure disorder. He is substantially better. He had status epilepticus and that has improved. He is therapeutic on his anticonvulsants now. It has been recommended that he go to skilled care facility and he initially refused but after I talked to him this morning he has agreed. He still has a Foley catheter. He is scheduled for potassium level to be checked tomorrow but I'd like to see what it is before he is discharged so I will do that this morning. Principal Problem:   Seizure disorder ([place]) Active Problems:   Hyperlipidemia   ANXIETY DEPRESSION   Essential hypertension   Coronary atherosclerosis   Stroke (HCC)   GERD   Bipolar 1 disorder, mixed, moderate (HCC)   COPD (chronic obstructive pulmonary disease) (HCC)   Hypokalemia   Status epilepticus ([place])    Plan: Probable discharge to skilled care facility when bed available    LOS: 7 days   Jeffrey Sweeney 01/10/2016, 8:51 AM

## 2016-01-10 NOTE — Clinical Social Work Note (Signed)
Patient is now agreeable to go to SNF. After speaking with his daughter, Elliotte Vance, he was agreeable to choose [facility].  [name] advised patient and daughter of discharge today.  [name] notified facility of patient discharge and discussed insurance authorization had been submitted and that patient would need the facility to provide transportation.     Dvora Buitron, Clydene Pugh, LCSW

## 2016-01-11 DIAGNOSIS — K219 Gastro-esophageal reflux disease without esophagitis: Secondary | ICD-10-CM | POA: Diagnosis not present

## 2016-01-11 DIAGNOSIS — E784 Other hyperlipidemia: Secondary | ICD-10-CM | POA: Diagnosis not present

## 2016-01-11 DIAGNOSIS — G4089 Other seizures: Secondary | ICD-10-CM | POA: Diagnosis not present

## 2016-01-11 DIAGNOSIS — I2510 Atherosclerotic heart disease of native coronary artery without angina pectoris: Secondary | ICD-10-CM | POA: Diagnosis not present

## 2016-01-11 DIAGNOSIS — E038 Other specified hypothyroidism: Secondary | ICD-10-CM | POA: Diagnosis not present

## 2016-01-30 DIAGNOSIS — R1312 Dysphagia, oropharyngeal phase: Secondary | ICD-10-CM | POA: Diagnosis not present

## 2016-01-30 DIAGNOSIS — I69352 Hemiplegia and hemiparesis following cerebral infarction affecting left dominant side: Secondary | ICD-10-CM | POA: Diagnosis not present

## 2016-01-30 DIAGNOSIS — J449 Chronic obstructive pulmonary disease, unspecified: Secondary | ICD-10-CM | POA: Diagnosis not present

## 2016-01-30 DIAGNOSIS — I2510 Atherosclerotic heart disease of native coronary artery without angina pectoris: Secondary | ICD-10-CM | POA: Diagnosis not present

## 2016-01-30 DIAGNOSIS — I69391 Dysphagia following cerebral infarction: Secondary | ICD-10-CM | POA: Diagnosis not present

## 2016-01-30 DIAGNOSIS — I10 Essential (primary) hypertension: Secondary | ICD-10-CM | POA: Diagnosis not present

## 2016-01-30 DIAGNOSIS — G40909 Epilepsy, unspecified, not intractable, without status epilepticus: Secondary | ICD-10-CM | POA: Diagnosis not present

## 2016-01-30 DIAGNOSIS — F319 Bipolar disorder, unspecified: Secondary | ICD-10-CM | POA: Diagnosis not present

## 2016-01-30 DIAGNOSIS — R41841 Cognitive communication deficit: Secondary | ICD-10-CM | POA: Diagnosis not present

## 2016-01-31 DIAGNOSIS — R41841 Cognitive communication deficit: Secondary | ICD-10-CM | POA: Diagnosis not present

## 2016-01-31 DIAGNOSIS — I10 Essential (primary) hypertension: Secondary | ICD-10-CM | POA: Diagnosis not present

## 2016-01-31 DIAGNOSIS — J449 Chronic obstructive pulmonary disease, unspecified: Secondary | ICD-10-CM | POA: Diagnosis not present

## 2016-01-31 DIAGNOSIS — F319 Bipolar disorder, unspecified: Secondary | ICD-10-CM | POA: Diagnosis not present

## 2016-01-31 DIAGNOSIS — I69352 Hemiplegia and hemiparesis following cerebral infarction affecting left dominant side: Secondary | ICD-10-CM | POA: Diagnosis not present

## 2016-01-31 DIAGNOSIS — R1312 Dysphagia, oropharyngeal phase: Secondary | ICD-10-CM | POA: Diagnosis not present

## 2016-01-31 DIAGNOSIS — I69391 Dysphagia following cerebral infarction: Secondary | ICD-10-CM | POA: Diagnosis not present

## 2016-01-31 DIAGNOSIS — G40909 Epilepsy, unspecified, not intractable, without status epilepticus: Secondary | ICD-10-CM | POA: Diagnosis not present

## 2016-01-31 DIAGNOSIS — I2510 Atherosclerotic heart disease of native coronary artery without angina pectoris: Secondary | ICD-10-CM | POA: Diagnosis not present

## 2016-02-01 DIAGNOSIS — F319 Bipolar disorder, unspecified: Secondary | ICD-10-CM | POA: Diagnosis not present

## 2016-02-01 DIAGNOSIS — I69391 Dysphagia following cerebral infarction: Secondary | ICD-10-CM | POA: Diagnosis not present

## 2016-02-01 DIAGNOSIS — R1312 Dysphagia, oropharyngeal phase: Secondary | ICD-10-CM | POA: Diagnosis not present

## 2016-02-01 DIAGNOSIS — R41841 Cognitive communication deficit: Secondary | ICD-10-CM | POA: Diagnosis not present

## 2016-02-01 DIAGNOSIS — I10 Essential (primary) hypertension: Secondary | ICD-10-CM | POA: Diagnosis not present

## 2016-02-01 DIAGNOSIS — G40909 Epilepsy, unspecified, not intractable, without status epilepticus: Secondary | ICD-10-CM | POA: Diagnosis not present

## 2016-02-01 DIAGNOSIS — J449 Chronic obstructive pulmonary disease, unspecified: Secondary | ICD-10-CM | POA: Diagnosis not present

## 2016-02-01 DIAGNOSIS — I69352 Hemiplegia and hemiparesis following cerebral infarction affecting left dominant side: Secondary | ICD-10-CM | POA: Diagnosis not present

## 2016-02-01 DIAGNOSIS — I2510 Atherosclerotic heart disease of native coronary artery without angina pectoris: Secondary | ICD-10-CM | POA: Diagnosis not present

## 2016-02-02 DIAGNOSIS — I69391 Dysphagia following cerebral infarction: Secondary | ICD-10-CM | POA: Diagnosis not present

## 2016-02-02 DIAGNOSIS — I69352 Hemiplegia and hemiparesis following cerebral infarction affecting left dominant side: Secondary | ICD-10-CM | POA: Diagnosis not present

## 2016-02-02 DIAGNOSIS — I2510 Atherosclerotic heart disease of native coronary artery without angina pectoris: Secondary | ICD-10-CM | POA: Diagnosis not present

## 2016-02-02 DIAGNOSIS — J449 Chronic obstructive pulmonary disease, unspecified: Secondary | ICD-10-CM | POA: Diagnosis not present

## 2016-02-02 DIAGNOSIS — R41841 Cognitive communication deficit: Secondary | ICD-10-CM | POA: Diagnosis not present

## 2016-02-02 DIAGNOSIS — R1312 Dysphagia, oropharyngeal phase: Secondary | ICD-10-CM | POA: Diagnosis not present

## 2016-02-02 DIAGNOSIS — G40909 Epilepsy, unspecified, not intractable, without status epilepticus: Secondary | ICD-10-CM | POA: Diagnosis not present

## 2016-02-02 DIAGNOSIS — F319 Bipolar disorder, unspecified: Secondary | ICD-10-CM | POA: Diagnosis not present

## 2016-02-02 DIAGNOSIS — I10 Essential (primary) hypertension: Secondary | ICD-10-CM | POA: Diagnosis not present

## 2016-02-05 DIAGNOSIS — J449 Chronic obstructive pulmonary disease, unspecified: Secondary | ICD-10-CM | POA: Diagnosis not present

## 2016-02-05 DIAGNOSIS — I69391 Dysphagia following cerebral infarction: Secondary | ICD-10-CM | POA: Diagnosis not present

## 2016-02-05 DIAGNOSIS — I10 Essential (primary) hypertension: Secondary | ICD-10-CM | POA: Diagnosis not present

## 2016-02-05 DIAGNOSIS — I69352 Hemiplegia and hemiparesis following cerebral infarction affecting left dominant side: Secondary | ICD-10-CM | POA: Diagnosis not present

## 2016-02-05 DIAGNOSIS — G40909 Epilepsy, unspecified, not intractable, without status epilepticus: Secondary | ICD-10-CM | POA: Diagnosis not present

## 2016-02-05 DIAGNOSIS — I2510 Atherosclerotic heart disease of native coronary artery without angina pectoris: Secondary | ICD-10-CM | POA: Diagnosis not present

## 2016-02-05 DIAGNOSIS — R41841 Cognitive communication deficit: Secondary | ICD-10-CM | POA: Diagnosis not present

## 2016-02-05 DIAGNOSIS — F319 Bipolar disorder, unspecified: Secondary | ICD-10-CM | POA: Diagnosis not present

## 2016-02-05 DIAGNOSIS — R1312 Dysphagia, oropharyngeal phase: Secondary | ICD-10-CM | POA: Diagnosis not present

## 2016-02-06 DIAGNOSIS — G40909 Epilepsy, unspecified, not intractable, without status epilepticus: Secondary | ICD-10-CM | POA: Diagnosis not present

## 2016-02-06 DIAGNOSIS — R1312 Dysphagia, oropharyngeal phase: Secondary | ICD-10-CM | POA: Diagnosis not present

## 2016-02-06 DIAGNOSIS — I69391 Dysphagia following cerebral infarction: Secondary | ICD-10-CM | POA: Diagnosis not present

## 2016-02-06 DIAGNOSIS — I10 Essential (primary) hypertension: Secondary | ICD-10-CM | POA: Diagnosis not present

## 2016-02-06 DIAGNOSIS — R41841 Cognitive communication deficit: Secondary | ICD-10-CM | POA: Diagnosis not present

## 2016-02-06 DIAGNOSIS — I69352 Hemiplegia and hemiparesis following cerebral infarction affecting left dominant side: Secondary | ICD-10-CM | POA: Diagnosis not present

## 2016-02-06 DIAGNOSIS — F319 Bipolar disorder, unspecified: Secondary | ICD-10-CM | POA: Diagnosis not present

## 2016-02-06 DIAGNOSIS — I2510 Atherosclerotic heart disease of native coronary artery without angina pectoris: Secondary | ICD-10-CM | POA: Diagnosis not present

## 2016-02-06 DIAGNOSIS — J449 Chronic obstructive pulmonary disease, unspecified: Secondary | ICD-10-CM | POA: Diagnosis not present

## 2016-02-07 DIAGNOSIS — G40909 Epilepsy, unspecified, not intractable, without status epilepticus: Secondary | ICD-10-CM | POA: Diagnosis not present

## 2016-02-07 DIAGNOSIS — J449 Chronic obstructive pulmonary disease, unspecified: Secondary | ICD-10-CM | POA: Diagnosis not present

## 2016-02-07 DIAGNOSIS — R41841 Cognitive communication deficit: Secondary | ICD-10-CM | POA: Diagnosis not present

## 2016-02-07 DIAGNOSIS — I69391 Dysphagia following cerebral infarction: Secondary | ICD-10-CM | POA: Diagnosis not present

## 2016-02-07 DIAGNOSIS — I69352 Hemiplegia and hemiparesis following cerebral infarction affecting left dominant side: Secondary | ICD-10-CM | POA: Diagnosis not present

## 2016-02-07 DIAGNOSIS — F319 Bipolar disorder, unspecified: Secondary | ICD-10-CM | POA: Diagnosis not present

## 2016-02-07 DIAGNOSIS — R1312 Dysphagia, oropharyngeal phase: Secondary | ICD-10-CM | POA: Diagnosis not present

## 2016-02-07 DIAGNOSIS — I10 Essential (primary) hypertension: Secondary | ICD-10-CM | POA: Diagnosis not present

## 2016-02-07 DIAGNOSIS — I2510 Atherosclerotic heart disease of native coronary artery without angina pectoris: Secondary | ICD-10-CM | POA: Diagnosis not present

## 2016-02-08 DIAGNOSIS — R1312 Dysphagia, oropharyngeal phase: Secondary | ICD-10-CM | POA: Diagnosis not present

## 2016-02-08 DIAGNOSIS — F319 Bipolar disorder, unspecified: Secondary | ICD-10-CM | POA: Diagnosis not present

## 2016-02-08 DIAGNOSIS — I2510 Atherosclerotic heart disease of native coronary artery without angina pectoris: Secondary | ICD-10-CM | POA: Diagnosis not present

## 2016-02-08 DIAGNOSIS — I69391 Dysphagia following cerebral infarction: Secondary | ICD-10-CM | POA: Diagnosis not present

## 2016-02-08 DIAGNOSIS — I69352 Hemiplegia and hemiparesis following cerebral infarction affecting left dominant side: Secondary | ICD-10-CM | POA: Diagnosis not present

## 2016-02-08 DIAGNOSIS — I10 Essential (primary) hypertension: Secondary | ICD-10-CM | POA: Diagnosis not present

## 2016-02-08 DIAGNOSIS — G40909 Epilepsy, unspecified, not intractable, without status epilepticus: Secondary | ICD-10-CM | POA: Diagnosis not present

## 2016-02-08 DIAGNOSIS — J449 Chronic obstructive pulmonary disease, unspecified: Secondary | ICD-10-CM | POA: Diagnosis not present

## 2016-02-08 DIAGNOSIS — R41841 Cognitive communication deficit: Secondary | ICD-10-CM | POA: Diagnosis not present

## 2016-02-09 DIAGNOSIS — R41841 Cognitive communication deficit: Secondary | ICD-10-CM | POA: Diagnosis not present

## 2016-02-09 DIAGNOSIS — I69391 Dysphagia following cerebral infarction: Secondary | ICD-10-CM | POA: Diagnosis not present

## 2016-02-09 DIAGNOSIS — I2510 Atherosclerotic heart disease of native coronary artery without angina pectoris: Secondary | ICD-10-CM | POA: Diagnosis not present

## 2016-02-09 DIAGNOSIS — F319 Bipolar disorder, unspecified: Secondary | ICD-10-CM | POA: Diagnosis not present

## 2016-02-09 DIAGNOSIS — I69352 Hemiplegia and hemiparesis following cerebral infarction affecting left dominant side: Secondary | ICD-10-CM | POA: Diagnosis not present

## 2016-02-09 DIAGNOSIS — J449 Chronic obstructive pulmonary disease, unspecified: Secondary | ICD-10-CM | POA: Diagnosis not present

## 2016-02-09 DIAGNOSIS — I10 Essential (primary) hypertension: Secondary | ICD-10-CM | POA: Diagnosis not present

## 2016-02-09 DIAGNOSIS — G40909 Epilepsy, unspecified, not intractable, without status epilepticus: Secondary | ICD-10-CM | POA: Diagnosis not present

## 2016-02-09 DIAGNOSIS — R1312 Dysphagia, oropharyngeal phase: Secondary | ICD-10-CM | POA: Diagnosis not present

## 2016-02-10 ENCOUNTER — Emergency Department (HOSPITAL_COMMUNITY): Payer: Medicare HMO

## 2016-02-10 ENCOUNTER — Emergency Department (HOSPITAL_COMMUNITY)
Admission: EM | Admit: 2016-02-10 | Discharge: 2016-02-10 | Disposition: A | Discharge status: Home / Self Care | Payer: Medicare HMO | Attending: Emergency Medicine | Admitting: Emergency Medicine

## 2016-02-10 ENCOUNTER — Encounter (HOSPITAL_COMMUNITY): Payer: Self-pay

## 2016-02-10 DIAGNOSIS — I639 Cerebral infarction, unspecified: Secondary | ICD-10-CM | POA: Diagnosis not present

## 2016-02-10 DIAGNOSIS — Z87891 Personal history of nicotine dependence: Secondary | ICD-10-CM | POA: Diagnosis not present

## 2016-02-10 DIAGNOSIS — G8384 Todd's paralysis (postepileptic): Secondary | ICD-10-CM | POA: Diagnosis not present

## 2016-02-10 DIAGNOSIS — J449 Chronic obstructive pulmonary disease, unspecified: Secondary | ICD-10-CM | POA: Insufficient documentation

## 2016-02-10 DIAGNOSIS — I10 Essential (primary) hypertension: Secondary | ICD-10-CM | POA: Diagnosis not present

## 2016-02-10 DIAGNOSIS — I2510 Atherosclerotic heart disease of native coronary artery without angina pectoris: Secondary | ICD-10-CM | POA: Insufficient documentation

## 2016-02-10 DIAGNOSIS — R569 Unspecified convulsions: Secondary | ICD-10-CM

## 2016-02-10 DIAGNOSIS — Z79899 Other long term (current) drug therapy: Secondary | ICD-10-CM | POA: Insufficient documentation

## 2016-02-10 DIAGNOSIS — R350 Frequency of micturition: Secondary | ICD-10-CM | POA: Diagnosis present

## 2016-02-10 LAB — CBC WITH DIFFERENTIAL/PLATELET
BASOS PCT: 0 %
Basophils Absolute: 0 10*3/uL (ref 0.0–0.1)
EOS ABS: 0.1 10*3/uL (ref 0.0–0.7)
EOS PCT: 1 %
HCT: 43.8 % (ref 39.0–52.0)
HEMOGLOBIN: 14.7 g/dL (ref 13.0–17.0)
Lymphocytes Relative: 33 %
Lymphs Abs: 3.4 10*3/uL (ref 0.7–4.0)
MCH: 32.2 pg (ref 26.0–34.0)
MCHC: 33.6 g/dL (ref 30.0–36.0)
MCV: 96.1 fL (ref 78.0–100.0)
Monocytes Absolute: 0.6 10*3/uL (ref 0.1–1.0)
Monocytes Relative: 6 %
NEUTROS PCT: 60 %
Neutro Abs: 6 10*3/uL (ref 1.7–7.7)
PLATELETS: 162 10*3/uL (ref 150–400)
RBC: 4.56 MIL/uL (ref 4.22–5.81)
RDW: 16.7 % — ABNORMAL HIGH (ref 11.5–15.5)
WBC: 10.1 10*3/uL (ref 4.0–10.5)

## 2016-02-10 LAB — URINALYSIS, COMPLETE (UACMP) WITH MICROSCOPIC
BACTERIA UA: NONE SEEN
Bilirubin Urine: NEGATIVE
Glucose, UA: NEGATIVE mg/dL
HGB URINE DIPSTICK: NEGATIVE
KETONES UR: NEGATIVE mg/dL
Leukocytes, UA: NEGATIVE
NITRITE: NEGATIVE
PROTEIN: NEGATIVE mg/dL
SPECIFIC GRAVITY, URINE: 1.025 (ref 1.005–1.030)
pH: 5 (ref 5.0–8.0)

## 2016-02-10 LAB — COMPREHENSIVE METABOLIC PANEL
ALBUMIN: 3.2 g/dL — AB (ref 3.5–5.0)
ALT: 4 U/L — ABNORMAL LOW (ref 17–63)
ANION GAP: 6 (ref 5–15)
AST: 15 U/L (ref 15–41)
Alkaline Phosphatase: 61 U/L (ref 38–126)
BUN: 20 mg/dL (ref 6–20)
CHLORIDE: 108 mmol/L (ref 101–111)
CO2: 28 mmol/L (ref 22–32)
Calcium: 8.9 mg/dL (ref 8.9–10.3)
Creatinine, Ser: 0.83 mg/dL (ref 0.61–1.24)
GFR calc Af Amer: >60 mL/min (ref >60)
GFR calc non Af Amer: >60 mL/min (ref >60)
GLUCOSE: 97 mg/dL (ref 65–99)
Potassium: 4.1 mmol/L (ref 3.5–5.1)
SODIUM: 142 mmol/L (ref 135–145)
Total Bilirubin: 0.6 mg/dL (ref 0.3–1.2)
Total Protein: 6.7 g/dL (ref 6.5–8.1)

## 2016-02-10 LAB — VALPROIC ACID LEVEL

## 2016-02-10 LAB — CBG MONITORING, ED: Glucose-Capillary: 96 mg/dL (ref 65–99)

## 2016-02-10 MED ORDER — LEVETIRACETAM IN NACL 1000 MG/100ML IV SOLN
1000.0000 mg | Freq: Once | INTRAVENOUS | Status: AC
  Administered 2016-02-10: 1000 mg via INTRAVENOUS
  Filled 2016-02-10: qty 100

## 2016-02-10 MED ORDER — DIVALPROEX SODIUM 250 MG PO DR TAB: 750.0000 mg | DELAYED_RELEASE_TABLET | Freq: Three times a day (TID) | ORAL | 2 refills | Status: DC

## 2016-02-10 MED ORDER — LEVETIRACETAM 500 MG PO TABS: 1500.0000 mg | ORAL_TABLET | Freq: Two times a day (BID) | ORAL | 3 refills | Status: DC

## 2016-02-10 MED ORDER — DIVALPROEX SODIUM 250 MG PO DR TAB
750.0000 mg | DELAYED_RELEASE_TABLET | Freq: Once | ORAL | Status: AC
  Administered 2016-02-10: 750 mg via ORAL
  Filled 2016-02-10: qty 3

## 2016-02-10 MED ORDER — ASPIRIN EC 325 MG PO TBEC: 325.0000 mg | DELAYED_RELEASE_TABLET | Freq: Every day | ORAL | 3 refills | Status: DC

## 2016-02-10 MED ORDER — ASPIRIN 325 MG PO TABS
325.0000 mg | ORAL_TABLET | Freq: Once | ORAL | Status: AC
  Administered 2016-02-10: 325 mg via ORAL
  Filled 2016-02-10: qty 1

## 2016-02-10 NOTE — ED Provider Notes (Signed)
[facility] DEPT Provider Note   CSN: [id number] Arrival date & time: 02/10/16  1609     History   Chief Complaint Chief Complaint  Patient presents with  . Seizures  . Urinary Urgency    HPI Jeffrey Sweeney is a 71 y.o. male.  HPI 71 year old male who presents with left leg weakness and jerking. He has a history of atrial fibrillation, not anticoagulated, coronary artery disease, COPD, seizure disorder on Keppra and Depakote, and prior CVA with residual left arm weakness. Normally ambulatory with walker. States that he has been in his usual state of health. This morning he may be at around 6:54 AM he woke up and went to the bathroom. States that his left lower extremity felt suddenly weak and briefly numb. States that he had to drag himself back to bed with a walker. Shortly after he began to have twitching and jerking in the left lower extremity and abdominal wall. This went on for several hours. He did not have loss of consciousness. He did not have any vision or speech changes. Denies any recent illnesses including fever, cough, chest pain or shortness of breath, nausea vomiting or diarrhea, abdominal pain. Has noted some urinary frequency recently but without dysuria. Past Medical History:  Diagnosis Date  . AF (atrial fibrillation) ([place])   . Bipolar disorder ([place])   . CAD (coronary artery disease)   . COPD (chronic obstructive pulmonary disease) ([place])   . Depression   . Headache   . Heart disease   . Hypertension   . Seizures ([place])   . Stroke [facility])    left arm weakness    Patient Active Problem List   Diagnosis Date Noted  . Status epilepticus ([place]) 01/09/2016  . HCAP (healthcare-associated pneumonia) 01/11/2015  . Fever 01/10/2015  . Osteoporosis 01/07/2015  . Compression fracture of lumbar spine, non-traumatic ([place]) 01/07/2015  . COPD exacerbation ([place]) 01/05/2015  . Falls 01/05/2015  . Hypokalemia 08/14/2011  . DVT (deep venous thrombosis) ([place]) 08/14/2011  .  Atrial fibrillation ([place]) 08/13/2011  . Rib fractures 08/07/2011  . Respiratory distress 08/07/2011  . COPD (chronic obstructive pulmonary disease) ([place]) 08/07/2011  . Atelectasis 08/07/2011  . H/O chest tube placement 08/07/2011  . Hemothorax 08/07/2011  . Hyperlipidemia 07/17/2010  . ANXIETY DEPRESSION 07/17/2010  . Essential hypertension 07/17/2010  . Coronary atherosclerosis 07/17/2010  . CORONARY ATHEROSCLEROSIS NATIVE CORONARY ARTERY 07/17/2010  . Stroke ([place]) 07/17/2010  . CEREBROVASCULAR DISEASE 07/17/2010  . GERD 07/17/2010  . Seizure disorder ([place]) 07/17/2010  . SPLENIC INFARCTION 07/05/2009  . EMBOLISM&THROMBOSIS OF OTHER SPECIFIED ARTERY 07/05/2009  . EARLY SATIETY 07/05/2009  . WEIGHT LOSS, ABNORMAL 07/05/2009  . DIARRHEA 07/05/2009  . LUQ PAIN 07/05/2009  . PERSONAL HISTORY OF COLONIC POLYPS 07/05/2009  . Orthostatic hypotension 05/01/2009  . SYNCOPE 05/01/2009  . CHEST PAIN 05/01/2009  . LOW BACK PAIN, MILD 04/20/2009  . Bipolar 1 disorder, mixed, moderate ([place]) 04/20/2009    Past Surgical History:  Procedure Laterality Date  . ANKLE FRACTURE SURGERY         Home Medications    Prior to Admission medications   Medication Sig Start Date End Date Taking? Authorizing Provider  ALPRAZolam Duanne Moron) 1 MG tablet Take 1 mg by mouth 4 (four) times daily as needed for anxiety.   Yes Historical Provider, MD  levETIRAcetam (KEPPRA) 750 MG tablet Take 2 tablets (1,500 mg total) by mouth 2 (two) times daily. Patient taking differently: Take 1,000 mg by mouth 3 (three) times daily.  01/10/16  Yes Sinda Du, MD  nitroGLYCERIN (NITROSTAT) 0.4 MG SL tablet Place 0.4 mg under the tongue every 5 (five) minutes as needed for chest pain.    Yes Historical Provider, MD  pantoprazole (PROTONIX) 40 MG tablet Take 40 mg by mouth daily.     Yes Historical Provider, MD  valproic acid (DEPAKENE) 250 MG capsule Take 750 mg by mouth 2 (two) times daily.   Yes Historical Provider, MD    albuterol (PROVENTIL HFA) 108 (90 BASE) MCG/ACT inhaler Inhale 2 puffs into the lungs every 6 (six) hours as needed for wheezing or shortness of breath.    Historical Provider, MD  aspirin EC 325 MG tablet Take 1 tablet (325 mg total) by mouth daily. 02/10/16   Forde Dandy, MD  atorvastatin (LIPITOR) 40 MG tablet Take 40 mg by mouth at bedtime.     Historical Provider, MD  divalproex (DEPAKOTE) 250 MG DR tablet Take 3 tablets (750 mg total) by mouth 3 (three) times daily. 01/10/16   Sinda Du, MD  divalproex (DEPAKOTE) 250 MG DR tablet Take 3 tablets (750 mg total) by mouth 3 (three) times daily. 02/10/16   Forde Dandy, MD  levETIRAcetam (KEPPRA) 500 MG tablet Take 3 tablets (1,500 mg total) by mouth 2 (two) times daily. 02/10/16   Forde Dandy, MD  midodrine (PROAMATINE) 5 MG tablet Take 1 tablet (5 mg total) by mouth 3 (three) times daily with meals. 01/07/15   Sinda Du, MD    Family History Family History  Problem Relation Age of Onset  . Cancer Mother   . Cancer Father     Social History Social History  Substance Use Topics  . Smoking status: Former Smoker    Packs/day: 0.50    Years: 50.00    Types: Cigarettes    Quit date: 01/07/2014  . Smokeless tobacco: Never Used  . Alcohol use No     Allergies   Meperidine hcl   Review of Systems Review of Systems 10/14 systems reviewed and are negative other than those stated in the HPI   Physical Exam Updated Vital Signs BP 132/95   Pulse (!) 274   Temp 98.2 F (36.8 C) (Oral)   Resp 17   Ht 5\' 8"  (1.727 m)   Wt 184 lb (83.5 kg)   SpO2 92%   BMI 27.98 kg/m   Physical Exam Physical Exam  Nursing note and vitals reviewed. Constitutional: Well developed, well nourished, non-toxic, and in no acute distress Head: Normocephalic and atraumatic.  Mouth/Throat: Oropharynx is clear and moist.  Neck: Normal range of motion. Neck supple.  Cardiovascular: Normal rate and regular rhythm.   Pulmonary/Chest: Effort normal  and breath sounds normal.  Abdominal: Soft. There is no tenderness. There is no rebound and no guarding.  Musculoskeletal: Normal range of motion.  Skin: Skin is warm and dry.  Psychiatric: Cooperative Neurological:  Alert, oriented to person, place, time, and situation. Memory grossly in tact. Fluent speech. No dysarthria or aphasia.  Cranial nerves:  Pupils are symmetric, and reactive to light. EOMI without nystagmus. No gaze deviation. Facial muscles symmetric with activation. Sensation to light touch over face in tact bilaterally. Hearing grossly in tact. Palate elevates symmetrically. Head turn and shoulder shrug are intact. Tongue midline.  Reflexes defered.  Muscle bulk and tone normal. LUE in contracture and baseline weakness. Minimally anti-gravity in LLE (2/5 strength) Full strength in RUE and RLE.  Sensation to light touch is in tact throughout in  bilateral upper and lower extremities. Coordination reveals no dysmetria with finger to nose. Gait is narrow-based and steady. Non-ataxic.    ED Treatments / Results  Labs (all labs ordered are listed, but only abnormal results are displayed) Labs Reviewed  CBC WITH DIFFERENTIAL/PLATELET - Abnormal; Notable for the following:       Result Value   RDW 16.7 (*)    All other components within normal limits  COMPREHENSIVE METABOLIC PANEL - Abnormal; Notable for the following:    Albumin 3.2 (*)    ALT 4 (*)    All other components within normal limits  VALPROIC ACID LEVEL - Abnormal; Notable for the following:    Valproic Acid Lvl <10 (*)    All other components within normal limits  URINALYSIS, COMPLETE (UACMP) WITH MICROSCOPIC  CBG MONITORING, ED    EKG  EKG Interpretation  Date/Time:  Saturday February 10 2016 16:10:05 EST Ventricular Rate:  75 PR Interval:    QRS Duration: 102 QT Interval:  394 QTC Calculation: 397 R Axis:   96 Text Interpretation:  Sinus rhythm Supraventricular bigeminy Right axis deviation Confirmed  by Penn Grissett MD, Erhardt Dada [phone]) on 02/10/2016 4:16:16 PM       Radiology Ct Head Wo Contrast  Result Date: 02/10/2016 CLINICAL DATA:  Left leg weakness with jerking.  Numbness. EXAM: CT HEAD WITHOUT CONTRAST TECHNIQUE: Contiguous axial images were obtained from the base of the skull through the vertex without intravenous contrast. COMPARISON:  None. FINDINGS: Brain: No subdural, epidural, or subarachnoid hemorrhage. Ventricles and sulci are prominent but stable. Cerebellum, brainstem, and basal cisterns are normal. No mass, mass effect, or midline shift. Large right MCA territory infarct, unchanged. White matter changes. No acute cortical ischemia or infarct identified. Vascular: Calcified atherosclerosis is associated with the intracranial carotid arteries. Skull: Normal. Negative for fracture or focal lesion. Sinuses/Orbits: Chronic changes in the left maxillary sinus with opacification of the right ethmoid air cell. No acute sinus abnormalities. Mastoid air cells and middle ears are well aerated. Other: None. IMPRESSION: 1. No acute intracranial abnormality. Large right MCA territory infarct, unchanged. Scattered white matter changes. Electronically Signed   By: Dorise Bullion III M.D   On: 02/10/2016 18:05   Mr Brain Wo Contrast  Result Date: 02/10/2016 CLINICAL DATA:  Focal seizure with left lower extremity weakness. Ongoing seizure. EXAM: MRI HEAD WITHOUT CONTRAST TECHNIQUE: Multiplanar, multiecho pulse sequences of the brain and surrounding structures were obtained without intravenous contrast. COMPARISON:  01/04/2016 FINDINGS: Brain: Restricted diffusion in the right occipital cortex, paramedian. This area is edematous with mild thickening on fluid sensitive sequences. Extensive remote infarct in the right cerebral hemisphere, with dense encephalomalacia and gliosis in the right parietal, posterior frontal, and posterolateral temporal regions -MCA territory. Hemosiderin staining seen along the parietal  portion of the remote infarct. There is generalized volume loss and moderate chronic microvascular disease. No hydrocephalus or mass. Vascular: Preserved flow voids. Skull and upper cervical spine: Negative Sinuses/Orbits: Marked atelectasis of the left maxillary sinus. Motion degraded exam with possible ongoing seizure. IMPRESSION: 1. Right occipital restricted diffusion. Given the history and exclusive involvement of cortex across a broad area, seizure phenomenon is favored over acute infarct. There is extensive contiguous remote infarct in the right MCA territory which could serve as a seizure focus. Follow-up brain MRI could differentiate between seizure and infarct. 2. Generalized atrophy. Electronically Signed   By: Monte Fantasia M.D.   On: 02/10/2016 20:20    Procedures Procedures (including critical care time)  Medications Ordered in ED Medications  levETIRAcetam (KEPPRA) IVPB 1000 mg/100 mL premix (1,000 mg Intravenous Given 02/10/16 1837)  divalproex (DEPAKOTE) DR tablet 750 mg (750 mg Oral Given 02/10/16 1837)  aspirin tablet 325 mg (325 mg Oral Given 02/10/16 1918)     Initial Impression / Assessment and Plan / ED Course  I have reviewed the triage vital signs and the nursing notes.  Pertinent labs & imaging results that were available during my care of the patient were reviewed by me and considered in my medical decision making (see chart for details).    Presenting with concern for focal seizure. On exam, with significant LLE weakness. No further seizure activity. Question Todd's paralysis vs CVA/TIA. During observation in ED, with improved weakness. Although patient states compliance with medications, pharmacy tech states that he has not filled Keppra prescription from pharmacy and his depakote level is subtherapeutic. Loaded with both medications in the ED. Blood work otherwise reassuring. No metabolic or electrolyte derangements. No signs of infection.   Spoke with Dr. Merlene Laughter,  who felt presentation more likely to be todd's paralysis. He did request MRI to be performed in the ED. Felt he is stable for discharge home afterwards if stable.  MRI is visualized and reviewed with radiology. With some local changes that are consistent to be related to seizure and likely Todd's paralysis and less likely to be acute CVA. On reevaluation he has full resolution of the weakness and is able to ambulate with assistance as normally he is able to ambulate with walker at home. Discussed importance of medication compliance. Strict return and follow-up instructions reviewed. He expressed understanding of all discharge instructions and felt comfortable with the plan of care.   Final Clinical Impressions(s) / ED Diagnoses   Final diagnoses:  Focal seizure ([place])  Todd's paralysis (HCC)    New Prescriptions New Prescriptions   ASPIRIN EC 325 MG TABLET    Take 1 tablet (325 mg total) by mouth daily.   DIVALPROEX (DEPAKOTE) 250 MG DR TABLET    Take 3 tablets (750 mg total) by mouth 3 (three) times daily.   LEVETIRACETAM (KEPPRA) 500 MG TABLET    Take 3 tablets (1,500 mg total) by mouth 2 (two) times daily.     Forde Dandy, MD 02/10/16 [phone]

## 2016-02-10 NOTE — ED Notes (Signed)
Pt given fluids at this time

## 2016-02-10 NOTE — Discharge Instructions (Signed)
You had a seizure today likely from not receiving your seizure medications and your leg weakness was from the seizure but now is resolved.  PLEASE MAKE SURE YOU ARE RECEIVING YOUR SEIZURE MEDICATIONS AS PRESCRIBED.   Return without fail for worsening symptoms, including fever, confusion, recurrent seizures, or any other symptoms concerning ot you.

## 2016-02-10 NOTE — ED Triage Notes (Addendum)
Pt brought in by EMS. Pt states he slept all night and when he woke up he went to BR and started feeling weak, left leg feels numb and twitching approx 0830. Pt has sitter that called 911 because pt conts to twitch and jerk in lower ext, unable to move left leg at this time and abdomen. Pt is alert and answering all questions appropriately. Also reports problem with urinary urgency for one month and denies burning. Pt was medicated with ativan 2 mg IV at 1550

## 2016-02-10 NOTE — ED Notes (Signed)
Pt unsteady at first while ambulating but walked well with assistance and denied dizziness.

## 2016-02-12 DIAGNOSIS — I2510 Atherosclerotic heart disease of native coronary artery without angina pectoris: Secondary | ICD-10-CM | POA: Diagnosis not present

## 2016-02-12 DIAGNOSIS — G40909 Epilepsy, unspecified, not intractable, without status epilepticus: Secondary | ICD-10-CM | POA: Diagnosis not present

## 2016-02-12 DIAGNOSIS — I69352 Hemiplegia and hemiparesis following cerebral infarction affecting left dominant side: Secondary | ICD-10-CM | POA: Diagnosis not present

## 2016-02-12 DIAGNOSIS — R1312 Dysphagia, oropharyngeal phase: Secondary | ICD-10-CM | POA: Diagnosis not present

## 2016-02-12 DIAGNOSIS — R41841 Cognitive communication deficit: Secondary | ICD-10-CM | POA: Diagnosis not present

## 2016-02-12 DIAGNOSIS — J449 Chronic obstructive pulmonary disease, unspecified: Secondary | ICD-10-CM | POA: Diagnosis not present

## 2016-02-12 DIAGNOSIS — I69391 Dysphagia following cerebral infarction: Secondary | ICD-10-CM | POA: Diagnosis not present

## 2016-02-12 DIAGNOSIS — F319 Bipolar disorder, unspecified: Secondary | ICD-10-CM | POA: Diagnosis not present

## 2016-02-12 DIAGNOSIS — I10 Essential (primary) hypertension: Secondary | ICD-10-CM | POA: Diagnosis not present

## 2016-02-14 DIAGNOSIS — R41841 Cognitive communication deficit: Secondary | ICD-10-CM | POA: Diagnosis not present

## 2016-02-14 DIAGNOSIS — J449 Chronic obstructive pulmonary disease, unspecified: Secondary | ICD-10-CM | POA: Diagnosis not present

## 2016-02-14 DIAGNOSIS — I69391 Dysphagia following cerebral infarction: Secondary | ICD-10-CM | POA: Diagnosis not present

## 2016-02-14 DIAGNOSIS — R1312 Dysphagia, oropharyngeal phase: Secondary | ICD-10-CM | POA: Diagnosis not present

## 2016-02-14 DIAGNOSIS — F319 Bipolar disorder, unspecified: Secondary | ICD-10-CM | POA: Diagnosis not present

## 2016-02-14 DIAGNOSIS — I10 Essential (primary) hypertension: Secondary | ICD-10-CM | POA: Diagnosis not present

## 2016-02-14 DIAGNOSIS — G40909 Epilepsy, unspecified, not intractable, without status epilepticus: Secondary | ICD-10-CM | POA: Diagnosis not present

## 2016-02-14 DIAGNOSIS — I2510 Atherosclerotic heart disease of native coronary artery without angina pectoris: Secondary | ICD-10-CM | POA: Diagnosis not present

## 2016-02-14 DIAGNOSIS — I69352 Hemiplegia and hemiparesis following cerebral infarction affecting left dominant side: Secondary | ICD-10-CM | POA: Diagnosis not present

## 2016-02-15 DIAGNOSIS — I69352 Hemiplegia and hemiparesis following cerebral infarction affecting left dominant side: Secondary | ICD-10-CM | POA: Diagnosis not present

## 2016-02-15 DIAGNOSIS — I2510 Atherosclerotic heart disease of native coronary artery without angina pectoris: Secondary | ICD-10-CM | POA: Diagnosis not present

## 2016-02-15 DIAGNOSIS — I69391 Dysphagia following cerebral infarction: Secondary | ICD-10-CM | POA: Diagnosis not present

## 2016-02-15 DIAGNOSIS — F319 Bipolar disorder, unspecified: Secondary | ICD-10-CM | POA: Diagnosis not present

## 2016-02-15 DIAGNOSIS — R1312 Dysphagia, oropharyngeal phase: Secondary | ICD-10-CM | POA: Diagnosis not present

## 2016-02-15 DIAGNOSIS — G40909 Epilepsy, unspecified, not intractable, without status epilepticus: Secondary | ICD-10-CM | POA: Diagnosis not present

## 2016-02-15 DIAGNOSIS — R41841 Cognitive communication deficit: Secondary | ICD-10-CM | POA: Diagnosis not present

## 2016-02-15 DIAGNOSIS — J449 Chronic obstructive pulmonary disease, unspecified: Secondary | ICD-10-CM | POA: Diagnosis not present

## 2016-02-15 DIAGNOSIS — I10 Essential (primary) hypertension: Secondary | ICD-10-CM | POA: Diagnosis not present

## 2016-02-16 DIAGNOSIS — R1312 Dysphagia, oropharyngeal phase: Secondary | ICD-10-CM | POA: Diagnosis not present

## 2016-02-16 DIAGNOSIS — I69391 Dysphagia following cerebral infarction: Secondary | ICD-10-CM | POA: Diagnosis not present

## 2016-02-16 DIAGNOSIS — J449 Chronic obstructive pulmonary disease, unspecified: Secondary | ICD-10-CM | POA: Diagnosis not present

## 2016-02-16 DIAGNOSIS — G40909 Epilepsy, unspecified, not intractable, without status epilepticus: Secondary | ICD-10-CM | POA: Diagnosis not present

## 2016-02-16 DIAGNOSIS — R41841 Cognitive communication deficit: Secondary | ICD-10-CM | POA: Diagnosis not present

## 2016-02-16 DIAGNOSIS — I10 Essential (primary) hypertension: Secondary | ICD-10-CM | POA: Diagnosis not present

## 2016-02-16 DIAGNOSIS — I69352 Hemiplegia and hemiparesis following cerebral infarction affecting left dominant side: Secondary | ICD-10-CM | POA: Diagnosis not present

## 2016-02-16 DIAGNOSIS — I2510 Atherosclerotic heart disease of native coronary artery without angina pectoris: Secondary | ICD-10-CM | POA: Diagnosis not present

## 2016-02-16 DIAGNOSIS — F319 Bipolar disorder, unspecified: Secondary | ICD-10-CM | POA: Diagnosis not present

## 2016-02-19 DIAGNOSIS — J449 Chronic obstructive pulmonary disease, unspecified: Secondary | ICD-10-CM | POA: Diagnosis not present

## 2016-02-19 DIAGNOSIS — F319 Bipolar disorder, unspecified: Secondary | ICD-10-CM | POA: Diagnosis not present

## 2016-02-19 DIAGNOSIS — R1312 Dysphagia, oropharyngeal phase: Secondary | ICD-10-CM | POA: Diagnosis not present

## 2016-02-19 DIAGNOSIS — I69352 Hemiplegia and hemiparesis following cerebral infarction affecting left dominant side: Secondary | ICD-10-CM | POA: Diagnosis not present

## 2016-02-19 DIAGNOSIS — G40909 Epilepsy, unspecified, not intractable, without status epilepticus: Secondary | ICD-10-CM | POA: Diagnosis not present

## 2016-02-19 DIAGNOSIS — R41841 Cognitive communication deficit: Secondary | ICD-10-CM | POA: Diagnosis not present

## 2016-02-19 DIAGNOSIS — I10 Essential (primary) hypertension: Secondary | ICD-10-CM | POA: Diagnosis not present

## 2016-02-19 DIAGNOSIS — I69391 Dysphagia following cerebral infarction: Secondary | ICD-10-CM | POA: Diagnosis not present

## 2016-02-19 DIAGNOSIS — I2510 Atherosclerotic heart disease of native coronary artery without angina pectoris: Secondary | ICD-10-CM | POA: Diagnosis not present

## 2016-02-20 DIAGNOSIS — J449 Chronic obstructive pulmonary disease, unspecified: Secondary | ICD-10-CM | POA: Diagnosis not present

## 2016-02-20 DIAGNOSIS — R1312 Dysphagia, oropharyngeal phase: Secondary | ICD-10-CM | POA: Diagnosis not present

## 2016-02-20 DIAGNOSIS — G40909 Epilepsy, unspecified, not intractable, without status epilepticus: Secondary | ICD-10-CM | POA: Diagnosis not present

## 2016-02-20 DIAGNOSIS — I2510 Atherosclerotic heart disease of native coronary artery without angina pectoris: Secondary | ICD-10-CM | POA: Diagnosis not present

## 2016-02-20 DIAGNOSIS — I69391 Dysphagia following cerebral infarction: Secondary | ICD-10-CM | POA: Diagnosis not present

## 2016-02-20 DIAGNOSIS — R41841 Cognitive communication deficit: Secondary | ICD-10-CM | POA: Diagnosis not present

## 2016-02-20 DIAGNOSIS — F319 Bipolar disorder, unspecified: Secondary | ICD-10-CM | POA: Diagnosis not present

## 2016-02-20 DIAGNOSIS — I10 Essential (primary) hypertension: Secondary | ICD-10-CM | POA: Diagnosis not present

## 2016-02-20 DIAGNOSIS — I69352 Hemiplegia and hemiparesis following cerebral infarction affecting left dominant side: Secondary | ICD-10-CM | POA: Diagnosis not present

## 2016-02-21 DIAGNOSIS — I69352 Hemiplegia and hemiparesis following cerebral infarction affecting left dominant side: Secondary | ICD-10-CM | POA: Diagnosis not present

## 2016-02-21 DIAGNOSIS — F319 Bipolar disorder, unspecified: Secondary | ICD-10-CM | POA: Diagnosis not present

## 2016-02-21 DIAGNOSIS — R1312 Dysphagia, oropharyngeal phase: Secondary | ICD-10-CM | POA: Diagnosis not present

## 2016-02-21 DIAGNOSIS — I2510 Atherosclerotic heart disease of native coronary artery without angina pectoris: Secondary | ICD-10-CM | POA: Diagnosis not present

## 2016-02-21 DIAGNOSIS — G40909 Epilepsy, unspecified, not intractable, without status epilepticus: Secondary | ICD-10-CM | POA: Diagnosis not present

## 2016-02-21 DIAGNOSIS — I69391 Dysphagia following cerebral infarction: Secondary | ICD-10-CM | POA: Diagnosis not present

## 2016-02-21 DIAGNOSIS — R41841 Cognitive communication deficit: Secondary | ICD-10-CM | POA: Diagnosis not present

## 2016-02-21 DIAGNOSIS — J449 Chronic obstructive pulmonary disease, unspecified: Secondary | ICD-10-CM | POA: Diagnosis not present

## 2016-02-21 DIAGNOSIS — I10 Essential (primary) hypertension: Secondary | ICD-10-CM | POA: Diagnosis not present

## 2016-02-22 DIAGNOSIS — G40909 Epilepsy, unspecified, not intractable, without status epilepticus: Secondary | ICD-10-CM | POA: Diagnosis not present

## 2016-02-22 DIAGNOSIS — I2510 Atherosclerotic heart disease of native coronary artery without angina pectoris: Secondary | ICD-10-CM | POA: Diagnosis not present

## 2016-02-22 DIAGNOSIS — F319 Bipolar disorder, unspecified: Secondary | ICD-10-CM | POA: Diagnosis not present

## 2016-02-22 DIAGNOSIS — J449 Chronic obstructive pulmonary disease, unspecified: Secondary | ICD-10-CM | POA: Diagnosis not present

## 2016-02-22 DIAGNOSIS — I10 Essential (primary) hypertension: Secondary | ICD-10-CM | POA: Diagnosis not present

## 2016-02-22 DIAGNOSIS — I69391 Dysphagia following cerebral infarction: Secondary | ICD-10-CM | POA: Diagnosis not present

## 2016-02-22 DIAGNOSIS — R41841 Cognitive communication deficit: Secondary | ICD-10-CM | POA: Diagnosis not present

## 2016-02-22 DIAGNOSIS — R1312 Dysphagia, oropharyngeal phase: Secondary | ICD-10-CM | POA: Diagnosis not present

## 2016-02-22 DIAGNOSIS — I69352 Hemiplegia and hemiparesis following cerebral infarction affecting left dominant side: Secondary | ICD-10-CM | POA: Diagnosis not present

## 2016-03-01 DIAGNOSIS — F319 Bipolar disorder, unspecified: Secondary | ICD-10-CM | POA: Diagnosis not present

## 2016-03-01 DIAGNOSIS — I69352 Hemiplegia and hemiparesis following cerebral infarction affecting left dominant side: Secondary | ICD-10-CM | POA: Diagnosis not present

## 2016-03-01 DIAGNOSIS — I10 Essential (primary) hypertension: Secondary | ICD-10-CM | POA: Diagnosis not present

## 2016-03-01 DIAGNOSIS — R41841 Cognitive communication deficit: Secondary | ICD-10-CM | POA: Diagnosis not present

## 2016-03-01 DIAGNOSIS — G40909 Epilepsy, unspecified, not intractable, without status epilepticus: Secondary | ICD-10-CM | POA: Diagnosis not present

## 2016-03-01 DIAGNOSIS — R1312 Dysphagia, oropharyngeal phase: Secondary | ICD-10-CM | POA: Diagnosis not present

## 2016-03-01 DIAGNOSIS — I69391 Dysphagia following cerebral infarction: Secondary | ICD-10-CM | POA: Diagnosis not present

## 2016-03-01 DIAGNOSIS — I2510 Atherosclerotic heart disease of native coronary artery without angina pectoris: Secondary | ICD-10-CM | POA: Diagnosis not present

## 2016-03-01 DIAGNOSIS — J449 Chronic obstructive pulmonary disease, unspecified: Secondary | ICD-10-CM | POA: Diagnosis not present

## 2016-03-07 DIAGNOSIS — I69352 Hemiplegia and hemiparesis following cerebral infarction affecting left dominant side: Secondary | ICD-10-CM | POA: Diagnosis not present

## 2016-03-07 DIAGNOSIS — J449 Chronic obstructive pulmonary disease, unspecified: Secondary | ICD-10-CM | POA: Diagnosis not present

## 2016-03-07 DIAGNOSIS — F319 Bipolar disorder, unspecified: Secondary | ICD-10-CM | POA: Diagnosis not present

## 2016-03-07 DIAGNOSIS — I69391 Dysphagia following cerebral infarction: Secondary | ICD-10-CM | POA: Diagnosis not present

## 2016-03-07 DIAGNOSIS — R1312 Dysphagia, oropharyngeal phase: Secondary | ICD-10-CM | POA: Diagnosis not present

## 2016-03-07 DIAGNOSIS — G40909 Epilepsy, unspecified, not intractable, without status epilepticus: Secondary | ICD-10-CM | POA: Diagnosis not present

## 2016-03-07 DIAGNOSIS — I2510 Atherosclerotic heart disease of native coronary artery without angina pectoris: Secondary | ICD-10-CM | POA: Diagnosis not present

## 2016-03-07 DIAGNOSIS — R41841 Cognitive communication deficit: Secondary | ICD-10-CM | POA: Diagnosis not present

## 2016-03-07 DIAGNOSIS — I10 Essential (primary) hypertension: Secondary | ICD-10-CM | POA: Diagnosis not present

## 2016-03-12 DIAGNOSIS — I69391 Dysphagia following cerebral infarction: Secondary | ICD-10-CM | POA: Diagnosis not present

## 2016-03-12 DIAGNOSIS — I10 Essential (primary) hypertension: Secondary | ICD-10-CM | POA: Diagnosis not present

## 2016-03-12 DIAGNOSIS — F319 Bipolar disorder, unspecified: Secondary | ICD-10-CM | POA: Diagnosis not present

## 2016-03-12 DIAGNOSIS — I2510 Atherosclerotic heart disease of native coronary artery without angina pectoris: Secondary | ICD-10-CM | POA: Diagnosis not present

## 2016-03-12 DIAGNOSIS — J449 Chronic obstructive pulmonary disease, unspecified: Secondary | ICD-10-CM | POA: Diagnosis not present

## 2016-03-12 DIAGNOSIS — R41841 Cognitive communication deficit: Secondary | ICD-10-CM | POA: Diagnosis not present

## 2016-03-12 DIAGNOSIS — R1312 Dysphagia, oropharyngeal phase: Secondary | ICD-10-CM | POA: Diagnosis not present

## 2016-03-12 DIAGNOSIS — I69352 Hemiplegia and hemiparesis following cerebral infarction affecting left dominant side: Secondary | ICD-10-CM | POA: Diagnosis not present

## 2016-03-12 DIAGNOSIS — G40909 Epilepsy, unspecified, not intractable, without status epilepticus: Secondary | ICD-10-CM | POA: Diagnosis not present

## 2016-07-03 ENCOUNTER — Encounter (HOSPITAL_COMMUNITY): Payer: Self-pay | Admitting: Emergency Medicine

## 2016-07-03 ENCOUNTER — Emergency Department (HOSPITAL_COMMUNITY): Payer: Medicare HMO

## 2016-07-03 ENCOUNTER — Inpatient Hospital Stay (HOSPITAL_COMMUNITY)
Admission: EM | Admit: 2016-07-03 | Discharge: 2016-07-10 | DRG: 101 | Disposition: A | Discharge status: Skilled Nursing Facility | Payer: Medicare HMO | Attending: Pulmonary Disease | Admitting: Pulmonary Disease

## 2016-07-03 DIAGNOSIS — G4089 Other seizures: Secondary | ICD-10-CM | POA: Diagnosis not present

## 2016-07-03 DIAGNOSIS — F4489 Other dissociative and conversion disorders: Secondary | ICD-10-CM | POA: Diagnosis not present

## 2016-07-03 DIAGNOSIS — Z7982 Long term (current) use of aspirin: Secondary | ICD-10-CM | POA: Diagnosis not present

## 2016-07-03 DIAGNOSIS — R4182 Altered mental status, unspecified: Secondary | ICD-10-CM | POA: Diagnosis not present

## 2016-07-03 DIAGNOSIS — I10 Essential (primary) hypertension: Secondary | ICD-10-CM | POA: Diagnosis not present

## 2016-07-03 DIAGNOSIS — Z79899 Other long term (current) drug therapy: Secondary | ICD-10-CM | POA: Diagnosis not present

## 2016-07-03 DIAGNOSIS — I4891 Unspecified atrial fibrillation: Secondary | ICD-10-CM | POA: Diagnosis present

## 2016-07-03 DIAGNOSIS — G40901 Epilepsy, unspecified, not intractable, with status epilepticus: Principal | ICD-10-CM | POA: Diagnosis present

## 2016-07-03 DIAGNOSIS — F419 Anxiety disorder, unspecified: Secondary | ICD-10-CM | POA: Diagnosis not present

## 2016-07-03 DIAGNOSIS — J449 Chronic obstructive pulmonary disease, unspecified: Secondary | ICD-10-CM | POA: Diagnosis not present

## 2016-07-03 DIAGNOSIS — I69354 Hemiplegia and hemiparesis following cerebral infarction affecting left non-dominant side: Secondary | ICD-10-CM | POA: Diagnosis not present

## 2016-07-03 DIAGNOSIS — I2510 Atherosclerotic heart disease of native coronary artery without angina pectoris: Secondary | ICD-10-CM | POA: Diagnosis not present

## 2016-07-03 DIAGNOSIS — I6930 Unspecified sequelae of cerebral infarction: Secondary | ICD-10-CM

## 2016-07-03 DIAGNOSIS — Z7401 Bed confinement status: Secondary | ICD-10-CM | POA: Diagnosis not present

## 2016-07-03 DIAGNOSIS — R279 Unspecified lack of coordination: Secondary | ICD-10-CM | POA: Diagnosis not present

## 2016-07-03 DIAGNOSIS — F3162 Bipolar disorder, current episode mixed, moderate: Secondary | ICD-10-CM | POA: Diagnosis present

## 2016-07-03 DIAGNOSIS — F329 Major depressive disorder, single episode, unspecified: Secondary | ICD-10-CM | POA: Diagnosis present

## 2016-07-03 DIAGNOSIS — R569 Unspecified convulsions: Secondary | ICD-10-CM

## 2016-07-03 DIAGNOSIS — K219 Gastro-esophageal reflux disease without esophagitis: Secondary | ICD-10-CM | POA: Diagnosis present

## 2016-07-03 DIAGNOSIS — R269 Unspecified abnormalities of gait and mobility: Secondary | ICD-10-CM | POA: Diagnosis not present

## 2016-07-03 DIAGNOSIS — I69352 Hemiplegia and hemiparesis following cerebral infarction affecting left dominant side: Secondary | ICD-10-CM | POA: Diagnosis not present

## 2016-07-03 DIAGNOSIS — E7800 Pure hypercholesterolemia, unspecified: Secondary | ICD-10-CM | POA: Diagnosis not present

## 2016-07-03 DIAGNOSIS — Z9114 Patient's other noncompliance with medication regimen: Secondary | ICD-10-CM | POA: Diagnosis not present

## 2016-07-03 DIAGNOSIS — G40909 Epilepsy, unspecified, not intractable, without status epilepticus: Secondary | ICD-10-CM | POA: Diagnosis not present

## 2016-07-03 DIAGNOSIS — K210 Gastro-esophageal reflux disease with esophagitis: Secondary | ICD-10-CM | POA: Diagnosis not present

## 2016-07-03 DIAGNOSIS — F319 Bipolar disorder, unspecified: Secondary | ICD-10-CM | POA: Diagnosis not present

## 2016-07-03 DIAGNOSIS — I517 Cardiomegaly: Secondary | ICD-10-CM | POA: Diagnosis not present

## 2016-07-03 LAB — COMPREHENSIVE METABOLIC PANEL
ALK PHOS: 68 U/L (ref 38–126)
ALT: 10 U/L — AB (ref 17–63)
AST: 19 U/L (ref 15–41)
Albumin: 4.3 g/dL (ref 3.5–5.0)
Anion gap: 11 (ref 5–15)
BUN: 15 mg/dL (ref 6–20)
CO2: 26 mmol/L (ref 22–32)
CREATININE: 0.93 mg/dL (ref 0.61–1.24)
Calcium: 9.5 mg/dL (ref 8.9–10.3)
Chloride: 102 mmol/L (ref 101–111)
Glucose, Bld: 115 mg/dL — ABNORMAL HIGH (ref 65–99)
Potassium: 3.8 mmol/L (ref 3.5–5.1)
Sodium: 139 mmol/L (ref 135–145)
TOTAL PROTEIN: 7.6 g/dL (ref 6.5–8.1)
Total Bilirubin: 0.9 mg/dL (ref 0.3–1.2)

## 2016-07-03 LAB — CBC WITH DIFFERENTIAL/PLATELET
Basophils Absolute: 0 10*3/uL (ref 0.0–0.1)
Basophils Relative: 0 %
EOS PCT: 0 %
Eosinophils Absolute: 0 10*3/uL (ref 0.0–0.7)
HCT: 49.5 % (ref 39.0–52.0)
HEMOGLOBIN: 16.9 g/dL (ref 13.0–17.0)
LYMPHS ABS: 1.7 10*3/uL (ref 0.7–4.0)
LYMPHS PCT: 14 %
MCH: 32.4 pg (ref 26.0–34.0)
MCHC: 34.1 g/dL (ref 30.0–36.0)
MCV: 94.8 fL (ref 78.0–100.0)
Monocytes Absolute: 0.9 10*3/uL (ref 0.1–1.0)
Monocytes Relative: 8 %
Neutro Abs: 9.7 10*3/uL — ABNORMAL HIGH (ref 1.7–7.7)
Neutrophils Relative %: 78 %
PLATELETS: 195 10*3/uL (ref 150–400)
RBC: 5.22 MIL/uL (ref 4.22–5.81)
RDW: 13.4 % (ref 11.5–15.5)
WBC: 12.3 10*3/uL — AB (ref 4.0–10.5)

## 2016-07-03 LAB — I-STAT CG4 LACTIC ACID, ED: LACTIC ACID, VENOUS: 1.88 mmol/L (ref 0.5–1.9)

## 2016-07-03 LAB — VALPROIC ACID LEVEL: VALPROIC ACID LVL: 29 ug/mL — AB (ref 50.0–100.0)

## 2016-07-03 MED ORDER — LEVETIRACETAM IN NACL 1000 MG/100ML IV SOLN
1000.0000 mg | Freq: Once | INTRAVENOUS | Status: AC
  Administered 2016-07-04: 1000 mg via INTRAVENOUS
  Filled 2016-07-03: qty 100

## 2016-07-03 MED ORDER — SODIUM CHLORIDE 0.9 % IV BOLUS (SEPSIS)
500.0000 mL | Freq: Once | INTRAVENOUS | Status: AC
  Administered 2016-07-04: 500 mL via INTRAVENOUS

## 2016-07-03 NOTE — ED Triage Notes (Signed)
Per EMS patient's family witnessed pt having seizure.  Pt had seizure on the way to hospital. 1 min duration. Ativan given via EMS.

## 2016-07-03 NOTE — ED Provider Notes (Signed)
[facility] DEPT Provider Note   CSN: [id number] Arrival date & time: 07/03/16  2146     History   Chief Complaint Chief Complaint  Patient presents with  . Seizures    HPI Jeffrey Sweeney is a 71 y.o. male.  Level V caveat for altered mental status. Patient presents by EMS after witnessed seizure at home. EMS reports second tonic-clonic seizure on way to hospital lasting 1 minute in duration and Ativan is given. Patient does have a history of seizure disorder on Keppra and Depakote. He states compliance with his medications with his history is unreliable. There is no family at bedside. Patient has left-sided weakness from previous stroke which is unchanged. He denies any recent illness. No fever, chills, cough, runny nose, nausea or vomiting. No chest pain or shortness of breath. He denies any pain. He is not sure why he is in the hospital and thinks he is at Dr. Luan Pulling office.   The history is provided by the patient and the EMS personnel. The history is limited by the condition of the patient.  Seizures      Past Medical History:  Diagnosis Date  . AF (atrial fibrillation) ([place])   . Bipolar disorder ([place])   . CAD (coronary artery disease)   . COPD (chronic obstructive pulmonary disease) ([place])   . Depression   . Headache   . Heart disease   . Hypertension   . Seizures ([place])   . Stroke [facility])    left arm weakness    Patient Active Problem List   Diagnosis Date Noted  . Status epilepticus ([place]) 01/09/2016  . HCAP (healthcare-associated pneumonia) 01/11/2015  . Fever 01/10/2015  . Osteoporosis 01/07/2015  . Compression fracture of lumbar spine, non-traumatic ([place]) 01/07/2015  . COPD exacerbation ([place]) 01/05/2015  . Falls 01/05/2015  . Hypokalemia 08/14/2011  . DVT (deep venous thrombosis) ([place]) 08/14/2011  . Atrial fibrillation ([place]) 08/13/2011  . Rib fractures 08/07/2011  . Respiratory distress 08/07/2011  . COPD (chronic obstructive pulmonary disease) ([place])  08/07/2011  . Atelectasis 08/07/2011  . H/O chest tube placement 08/07/2011  . Hemothorax 08/07/2011  . Hyperlipidemia 07/17/2010  . ANXIETY DEPRESSION 07/17/2010  . Essential hypertension 07/17/2010  . Coronary atherosclerosis 07/17/2010  . CORONARY ATHEROSCLEROSIS NATIVE CORONARY ARTERY 07/17/2010  . Stroke ([place]) 07/17/2010  . CEREBROVASCULAR DISEASE 07/17/2010  . GERD 07/17/2010  . Seizure disorder ([place]) 07/17/2010  . SPLENIC INFARCTION 07/05/2009  . EMBOLISM&THROMBOSIS OF OTHER SPECIFIED ARTERY 07/05/2009  . EARLY SATIETY 07/05/2009  . WEIGHT LOSS, ABNORMAL 07/05/2009  . DIARRHEA 07/05/2009  . LUQ PAIN 07/05/2009  . PERSONAL HISTORY OF COLONIC POLYPS 07/05/2009  . Orthostatic hypotension 05/01/2009  . SYNCOPE 05/01/2009  . CHEST PAIN 05/01/2009  . LOW BACK PAIN, MILD 04/20/2009  . Bipolar 1 disorder, mixed, moderate ([place]) 04/20/2009    Past Surgical History:  Procedure Laterality Date  . ANKLE FRACTURE SURGERY         Home Medications    Prior to Admission medications   Medication Sig Start Date End Date Taking? Authorizing Provider  albuterol (PROVENTIL HFA) 108 (90 BASE) MCG/ACT inhaler Inhale 2 puffs into the lungs every 6 (six) hours as needed for wheezing or shortness of breath.    [provider]  ALPRAZolam Duanne Moron) 1 MG tablet Take 1 mg by mouth 4 (four) times daily as needed for anxiety.    [provider]  aspirin EC 325 MG tablet Take 1 tablet (325 mg total) by mouth daily. 02/10/16  Forde Dandy, MD  atorvastatin (LIPITOR) 40 MG tablet Take 40 mg by mouth at bedtime.     [provider]  divalproex (DEPAKOTE) 250 MG DR tablet Take 3 tablets (750 mg total) by mouth 3 (three) times daily. 01/10/16   Sinda Du, MD  divalproex (DEPAKOTE) 250 MG DR tablet Take 3 tablets (750 mg total) by mouth 3 (three) times daily. 02/10/16   Forde Dandy, MD  levETIRAcetam (KEPPRA) 500 MG tablet Take 3 tablets (1,500 mg total) by mouth 2 (two)  times daily. 02/10/16   Forde Dandy, MD  levETIRAcetam (KEPPRA) 750 MG tablet Take 2 tablets (1,500 mg total) by mouth 2 (two) times daily. Patient taking differently: Take 1,000 mg by mouth 3 (three) times daily.  01/10/16   Sinda Du, MD  midodrine (PROAMATINE) 5 MG tablet Take 1 tablet (5 mg total) by mouth 3 (three) times daily with meals. 01/07/15   Sinda Du, MD  nitroGLYCERIN (NITROSTAT) 0.4 MG SL tablet Place 0.4 mg under the tongue every 5 (five) minutes as needed for chest pain.     [provider]  pantoprazole (PROTONIX) 40 MG tablet Take 40 mg by mouth daily.      [provider]  valproic acid (DEPAKENE) 250 MG capsule Take 750 mg by mouth 2 (two) times daily.    [provider]    Family History Family History  Problem Relation Age of Onset  . Cancer Mother   . Cancer Father     Social History Social History  Substance Use Topics  . Smoking status: Former Smoker    Packs/day: 0.50    Years: 50.00    Types: Cigarettes    Quit date: 01/07/2014  . Smokeless tobacco: Never Used  . Alcohol use No     Allergies   Meperidine hcl   Review of Systems Review of Systems  Unable to perform ROS: Mental status change  Neurological: Positive for seizures.     Physical Exam Updated Vital Signs BP 109/65   Pulse 71   Temp 99 F (37.2 C) (Oral)   Resp (!) 23   SpO2 97%   Physical Exam  Constitutional: He appears well-developed and well-nourished. No distress.  HENT:  Head: Normocephalic and atraumatic.  Mouth/Throat: Oropharynx is clear and moist. No oropharyngeal exudate.  mucus membranes slightly dry  Eyes: Conjunctivae and EOM are normal. Pupils are equal, round, and reactive to light.  Neck: Normal range of motion. Neck supple.  No meningismus.  Cardiovascular: Normal rate, regular rhythm, normal heart sounds and intact distal pulses.   No murmur heard. Pulmonary/Chest: Effort normal and breath sounds normal. No  respiratory distress.  Abdominal: Soft. There is no tenderness. There is no rebound and no guarding.  Musculoskeletal: Normal range of motion. He exhibits no edema or tenderness.  Neurological: He is alert. No cranial nerve deficit. He exhibits normal muscle tone. Coordination normal.  Confused, oriented 1. Thinks he is at Dr. Luan Pulling office. Left arm is contracted. Minimal grip strength and left arm. 5/5 strength in right arm and RLE.  Decreased strength in LLE.  Difficulty following commands No facial droop.  Skin: Skin is warm.  Psychiatric: He has a normal mood and affect. His behavior is normal.  Nursing note and vitals reviewed.    ED Treatments / Results  Labs (all labs ordered are listed, but only abnormal results are displayed) Labs Reviewed  CBC WITH DIFFERENTIAL/PLATELET - Abnormal; Notable for the following:  Result Value   WBC 12.3 (*)    Neutro Abs 9.7 (*)    All other components within normal limits  COMPREHENSIVE METABOLIC PANEL - Abnormal; Notable for the following:    Glucose, Bld 115 (*)    ALT 10 (*)    All other components within normal limits  VALPROIC ACID LEVEL - Abnormal; Notable for the following:    Valproic Acid Lvl 29 (*)    All other components within normal limits  URINALYSIS, ROUTINE W REFLEX MICROSCOPIC - Abnormal; Notable for the following:    Ketones, ur 20 (*)    Protein, ur 30 (*)    All other components within normal limits  BLOOD GAS, ARTERIAL - Abnormal; Notable for the following:    pO2, Arterial 64.5 (*)    All other components within normal limits  AMMONIA - Abnormal; Notable for the following:    Ammonia 49 (*)    All other components within normal limits  CULTURE, BLOOD (ROUTINE X 2)  CULTURE, BLOOD (ROUTINE X 2)  URINE CULTURE  TROPONIN I  MAGNESIUM  I-STAT CG4 LACTIC ACID, ED  CBG MONITORING, ED    EKG  EKG Interpretation  Date/Time:  Wednesday July 03 2016 21:47:54 EDT Ventricular Rate:  108 PR Interval:      QRS Duration: 89 QT Interval:  317 QTC Calculation: 425 R Axis:   115 Text Interpretation:  Sinus tachycardia Right axis deviation Borderline ST depression, lateral leads Rate faster lateral ST depression Confirmed by Ezequiel Essex [phone]) on 07/03/2016 11:57:36 PM       Radiology Dg Chest 1 View  Result Date: 07/03/2016 CLINICAL DATA:  Seizure EXAM: CHEST 1 VIEW COMPARISON:  01/03/2016 FINDINGS: Borderline to mild cardiomegaly. No edema. No pleural effusion or focal pulmonary infiltrate. Aortic atherosclerosis. No pneumothorax. IMPRESSION: Borderline to mild cardiomegaly.  No edema or infiltrate Electronically Signed   By: Donavan Foil M.D.   On: 07/03/2016 23:58   Ct Head Wo Contrast  Result Date: 07/04/2016 CLINICAL DATA:  Seizure EXAM: CT HEAD WITHOUT CONTRAST TECHNIQUE: Contiguous axial images were obtained from the base of the skull through the vertex without intravenous contrast. COMPARISON:  02/10/2016 FINDINGS: Brain: No acute territorial infarction, hemorrhage or intracranial mass is seen. Extensive encephalomalacia of the right temporal, frontal and parietal lobes consistent with old infarct. Stable old infarct in the right sub insula. Moderate small vessel ischemic changes of the white matter. Marked atrophy. Stable ventricle size. Vascular: No hyperdense vessels. Carotid artery calcifications and vertebral artery calcifications. Skull: No fracture or suspicious bone lesion. Sinuses/Orbits: Mild mucosal thickening in the sinuses. Chronic deformity of the left maxillary sinus. No acute orbital abnormality. Other: None IMPRESSION: 1. No definite CT evidence for acute intracranial abnormality 2. Old large right-sided infarct 3. Atrophy and white matter small vessel ischemic changes Electronically Signed   By: Donavan Foil M.D.   On: 07/04/2016 00:15    Procedures Procedures (including critical care time)  Medications Ordered in ED Medications  sodium chloride 0.9 % bolus 500 mL  (not administered)  levETIRAcetam (KEPPRA) IVPB 1000 mg/100 mL premix (not administered)     Initial Impression / Assessment and Plan / ED Course  I have reviewed the triage vital signs and the nursing notes.  Pertinent labs & imaging results that were available during my care of the patient were reviewed by me and considered in my medical decision making (see chart for details).    Patient from home with witnessed seizure-like activity. He does  not know why he is here and is confused. Has left-sided weakness at baseline.  Feels warm but rectal temp is normal. White count is 12. CT head is stable. Depakote level is subtherapeutic. Patient given loading dose of the Depakote and Keppra.  CT head is stable. There is no evidence of infection. Urinalysis is negative. Chest x-rays negative.  Patient's son at bedside feels patient still confused and not yet back to baseline. Will plan admission.  Called to bedside at 3:30 AM by hospitalist. Patient not responding to painful stimuli. He has fluttering of his eyelids. Unclear if this represents seizure. He is given IV Ativan. Patient had just finished receiving Depakote.  D/w Dr. Cheral Marker of neurology. Patient is now more awake. He is answering questions appropriately. He may have had sedation from the Depakote. Is unclear whether the fluttering of his eyelids represented seizure but it seems to have resolved. Patient is responding and answering questions appropriately. Dr. Cheral Marker does not feel patient needs transfer to [facility] unless episodes recur.  D/w Dr. Darrick Meigs.  CRITICAL CARE Performed by: Ezequiel Essex Total critical care time: 45 minutes Critical care time was exclusive of separately billable procedures and treating other patients. Critical care was necessary to treat or prevent imminent or life-threatening deterioration. Critical care was time spent personally by me on the following activities: development of treatment plan with patient  and/or surrogate as well as nursing, discussions with consultants, evaluation of patient's response to treatment, examination of patient, obtaining history from patient or surrogate, ordering and performing treatments and interventions, ordering and review of laboratory studies, ordering and review of radiographic studies, pulse oximetry and re-evaluation of patient's condition.   Final Clinical Impressions(s) / ED Diagnoses   Final diagnoses:  Seizure disorder ([place])  Altered mental status, unspecified altered mental status type    New Prescriptions New Prescriptions   No medications on file     Ezequiel Essex, MD 07/04/16 [phone]

## 2016-07-04 DIAGNOSIS — R569 Unspecified convulsions: Secondary | ICD-10-CM | POA: Diagnosis not present

## 2016-07-04 DIAGNOSIS — R4182 Altered mental status, unspecified: Secondary | ICD-10-CM

## 2016-07-04 LAB — URINALYSIS, ROUTINE W REFLEX MICROSCOPIC
BILIRUBIN URINE: NEGATIVE
Bacteria, UA: NONE SEEN
Glucose, UA: NEGATIVE mg/dL
HGB URINE DIPSTICK: NEGATIVE
Ketones, ur: 20 mg/dL — AB
Leukocytes, UA: NEGATIVE
NITRITE: NEGATIVE
PH: 6 (ref 5.0–8.0)
Protein, ur: 30 mg/dL — AB
SPECIFIC GRAVITY, URINE: 1.024 (ref 1.005–1.030)
Squamous Epithelial / LPF: NONE SEEN

## 2016-07-04 LAB — CREATININE, SERUM
CREATININE: 0.83 mg/dL (ref 0.61–1.24)
GFR calc non Af Amer: >60 mL/min (ref >60)

## 2016-07-04 LAB — BLOOD GAS, ARTERIAL
Acid-Base Excess: 1.6 mmol/L (ref 0.0–2.0)
BICARBONATE: 25.7 mmol/L (ref 20.0–28.0)
DRAWN BY: 28459
FIO2: 28
O2 Saturation: 91.1 %
PCO2 ART: 39.7 mmHg (ref 32.0–48.0)
PH ART: 7.426 (ref 7.350–7.450)
Patient temperature: 37.1
pO2, Arterial: 64.5 mmHg — ABNORMAL LOW (ref 83.0–108.0)

## 2016-07-04 LAB — CBC
HCT: 46 % (ref 39.0–52.0)
HEMOGLOBIN: 15.6 g/dL (ref 13.0–17.0)
MCH: 32.1 pg (ref 26.0–34.0)
MCHC: 33.9 g/dL (ref 30.0–36.0)
MCV: 94.7 fL (ref 78.0–100.0)
PLATELETS: 177 10*3/uL (ref 150–400)
RBC: 4.86 MIL/uL (ref 4.22–5.81)
RDW: 13.5 % (ref 11.5–15.5)
WBC: 11.8 10*3/uL — AB (ref 4.0–10.5)

## 2016-07-04 LAB — MAGNESIUM: Magnesium: 2 mg/dL (ref 1.7–2.4)

## 2016-07-04 LAB — AMMONIA: AMMONIA: 49 umol/L — AB (ref 9–35)

## 2016-07-04 LAB — CBG MONITORING, ED: Glucose-Capillary: 98 mg/dL (ref 65–99)

## 2016-07-04 LAB — MRSA PCR SCREENING: MRSA BY PCR: INVALID — AB

## 2016-07-04 LAB — TROPONIN I: Troponin I: <0.03 ng/mL (ref <0.03)

## 2016-07-04 MED ORDER — DIVALPROEX SODIUM 250 MG PO DR TAB
750.0000 mg | DELAYED_RELEASE_TABLET | Freq: Three times a day (TID) | ORAL | Status: DC
Start: 2016-07-04 — End: 2016-07-10
  Administered 2016-07-04 – 2016-07-09 (×17): 750 mg via ORAL
  Filled 2016-07-04 (×17): qty 3

## 2016-07-04 MED ORDER — ENOXAPARIN SODIUM 40 MG/0.4ML ~~LOC~~ SOLN
40.0000 mg | SUBCUTANEOUS | Status: DC
  Administered 2016-07-04 – 2016-07-09 (×6): 40 mg via SUBCUTANEOUS
  Filled 2016-07-04 (×6): qty 0.4

## 2016-07-04 MED ORDER — ASPIRIN EC 325 MG PO TBEC
325.0000 mg | DELAYED_RELEASE_TABLET | Freq: Every day | ORAL | Status: DC
  Administered 2016-07-04 – 2016-07-09 (×6): 325 mg via ORAL
  Filled 2016-07-04 (×6): qty 1

## 2016-07-04 MED ORDER — ALBUTEROL SULFATE HFA 108 (90 BASE) MCG/ACT IN AERS: 2.0000 | INHALATION_SPRAY | Freq: Four times a day (QID) | RESPIRATORY_TRACT | Status: DC | PRN

## 2016-07-04 MED ORDER — LORAZEPAM 2 MG/ML IJ SOLN
1.0000 mg | Freq: Once | INTRAMUSCULAR | Status: AC
  Administered 2016-07-04: 1 mg via INTRAVENOUS

## 2016-07-04 MED ORDER — VALPROATE SODIUM 500 MG/5ML IV SOLN
500.0000 mg | Freq: Once | INTRAVENOUS | Status: AC
  Administered 2016-07-04: 500 mg via INTRAVENOUS
  Filled 2016-07-04: qty 5

## 2016-07-04 MED ORDER — LORAZEPAM 2 MG/ML IJ SOLN
INTRAMUSCULAR | Status: AC
  Filled 2016-07-04: qty 1

## 2016-07-04 MED ORDER — ATORVASTATIN CALCIUM 40 MG PO TABS
40.0000 mg | ORAL_TABLET | Freq: Every day | ORAL | Status: DC
  Administered 2016-07-04 – 2016-07-08 (×5): 40 mg via ORAL
  Filled 2016-07-04 (×5): qty 1

## 2016-07-04 MED ORDER — AMMONIA AROMATIC IN INHA
RESPIRATORY_TRACT | Status: AC
  Filled 2016-07-04: qty 10

## 2016-07-04 MED ORDER — LEVETIRACETAM 500 MG PO TABS
1500.0000 mg | ORAL_TABLET | Freq: Two times a day (BID) | ORAL | Status: DC
  Administered 2016-07-04 – 2016-07-09 (×11): 1500 mg via ORAL
  Filled 2016-07-04 (×11): qty 3

## 2016-07-04 MED ORDER — NICOTINE 21 MG/24HR TD PT24
21.0000 mg | MEDICATED_PATCH | Freq: Every day | TRANSDERMAL | Status: DC
  Administered 2016-07-04 – 2016-07-09 (×6): 21 mg via TRANSDERMAL
  Filled 2016-07-04 (×6): qty 1

## 2016-07-04 MED ORDER — MIDODRINE HCL 5 MG PO TABS
5.0000 mg | ORAL_TABLET | Freq: Three times a day (TID) | ORAL | Status: DC
  Administered 2016-07-04 – 2016-07-09 (×17): 5 mg via ORAL
  Filled 2016-07-04 (×17): qty 1

## 2016-07-04 MED ORDER — PANTOPRAZOLE SODIUM 40 MG PO TBEC
40.0000 mg | DELAYED_RELEASE_TABLET | Freq: Every day | ORAL | Status: DC
  Administered 2016-07-04 – 2016-07-09 (×6): 40 mg via ORAL
  Filled 2016-07-04 (×6): qty 1

## 2016-07-04 MED ORDER — LORAZEPAM 2 MG/ML IJ SOLN
1.0000 mg | INTRAMUSCULAR | Status: DC | PRN
  Administered 2016-07-04 – 2016-07-05 (×2): 1 mg via INTRAVENOUS
  Filled 2016-07-04 (×2): qty 1

## 2016-07-04 MED ORDER — ALBUTEROL SULFATE (2.5 MG/3ML) 0.083% IN NEBU: 2.5000 mg | INHALATION_SOLUTION | Freq: Four times a day (QID) | RESPIRATORY_TRACT | Status: DC | PRN

## 2016-07-04 MED ORDER — VALPROATE SODIUM 500 MG/5ML IV SOLN
INTRAVENOUS | Status: AC
  Filled 2016-07-04: qty 5

## 2016-07-04 NOTE — Care Management Obs Status (Signed)
[facility] NOTIFICATION   Patient Details  Name: Jeffrey Sweeney MRN: [id number] Date of Birth: July 12, 1945   Medicare Observation Status Notification Given:  Yes    Necole Minassian, Chauncey Reading, RN 07/04/2016, 1:47 PM

## 2016-07-04 NOTE — Consult Note (Signed)
[place] A. Merlene Laughter, MD     [email or web address]          Jeffrey Sweeney is an 71 y.o. male.   ASSESSMENT/PLAN: 1. Resolved his generalized status epilepticus in the face of noncompliance with seizure medication: The patient has been restarted on appropriate medication.  Long-term, we need to address the issue of noncompliance with medications. The basic problem seems to be cognitive impairment.   2. 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  3. Atrial fibrillation with previous infarct: The patient should be on anticoagulation unless there are clear contraindications.  4. 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 It appears that the patient has not been very compliant with his antiepileptic medications. Disease 10 to get confused and tends to forget if he is taking them or not. He does have supportive family but apparently lives alone. He has had a previous history of encephalopathy due to nonconvulsive status epilepticus. This was about 6 months ago in 2017. This is also due to noncompliance with medications. It appears that the patient has improved overnight and is at baseline. He is sitting in dinner with his son. He reports no complaints at this time. The review systems otherwise negative.   GENERAL: This is a very pleasant man in no acute distress.  HEENT: Normal  ABDOMEN: soft  EXTREMITIES: No edema; there is contracture of the left upper extremity status post stroke.   BACK: Normal  SKIN: Normal by inspection.    MENTAL STATUS: The patient is awake and alert. He is lucid and current and follows commands well but he is not oriented to time. He is oriented to hospital.   CRANIAL NERVES: Pupils are equal, round and reactive to light and  accomodation; extra ocular movements are full, there is no significant nystagmus; visual fields shows a dense left homonymous hemianopia; upper and lower facial muscles are normal in strength and symmetric, there is no flattening of the nasolabial folds; tongue is midline; uvula is midline; shoulder elevation is normal.  MOTOR: Right side shows normal tone, bulk and strength. Left upper extremity shows 3/5 weakness with the spasticity noted. Left leg shows mild weakness graded as 4+/5.  COORDINATION: Left finger to nose is normal, right finger to nose is normal, No rest tremor; no intention tremor; no postural tremor; no bradykinesia.  REFLEXES: Deep tendon reflexes are symmetrical and normal.   SENSATION: Unreliable but appears nonsymmetric to pain.        PRIOR NOTE 12-2015 Acute encephalopathy with the etiology currently unclear.  The most likely scenario is that the patient had a unwitnessed seizure. Given the prolonged encephalopathy, I am concerned about ongoing nonconvulsive seizures. We will therefore restart the patient's Depakote which for some reason has not been restarted since being admitted. The dose will also be increased. He is to continue with the [place]. We will also obtain an EEG. Repeat neurological examination will be done.    UPDATE  Complex partial status epilepticus  The patient's son is at the bedside. The patient reports that since he has been at the bedside and patient has had 6 episodes of head turning towards the left associated with altered responsiveness and confusion. It appears that the nurses of also reported the patient having these spells. There seemed to be some improvement  however. The nurse reports that previously he was turning constantly towards the left. He seems to be more oriented although he is having these frequent spells. We will therefore increase the dose of Keppra to 1500 mg twice a day and also increase the Depakote to 3 times a day dosing.  If he continues to have episodes we may have to start a third agent preferably vimpat 100 mg twice a day.  Blood pressure 101/67, pulse (!) 56, temperature 98.4 F (36.9 C), temperature source Oral, resp. rate 14, height '5\' 11"'$  (1.803 m), weight 179 lb 3.7 oz (81.3 kg), SpO2 (!) 88 %.  Past Medical History:  Diagnosis Date  . AF (atrial fibrillation) ([place])   . Bipolar disorder ([place])   . CAD (coronary artery disease)   . COPD (chronic obstructive pulmonary disease) ([place])   . Depression   . Headache   . Heart disease   . Hypertension   . Seizures ([place])   . Stroke [facility])    left arm weakness    Past Surgical History:  Procedure Laterality Date  . ANKLE FRACTURE SURGERY      Family History  Problem Relation Age of Onset  . Cancer Mother   . Cancer Father     Social History:  reports that he quit smoking about 2 years ago. His smoking use included Cigarettes. He has a 25.00 pack-year smoking history. He has never used smokeless tobacco. He reports that he does not drink alcohol or use drugs.  Allergies:  Allergies  Allergen Reactions  . Meperidine Hcl Nausea And Vomiting    Medications: Prior to Admission medications   Medication Sig Start Date End Date Taking? Authorizing Provider  albuterol (PROVENTIL HFA) 108 (90 BASE) MCG/ACT inhaler Inhale 2 puffs into the lungs every 6 (six) hours as needed for wheezing or shortness of breath.   Yes [provider]  ALPRAZolam Duanne Moron) 1 MG tablet Take 1 mg by mouth 4 (four) times daily as needed for anxiety.   Yes [provider]  aspirin EC 325 MG tablet Take 1 tablet (325 mg total) by mouth daily. 02/10/16  Yes Forde Dandy, MD  atorvastatin (LIPITOR) 40 MG tablet Take 40 mg by mouth at bedtime.    Yes [provider]  divalproex (DEPAKOTE) 250 MG DR tablet Take 3 tablets (750 mg total) by mouth 3 (three) times daily. 02/10/16  Yes Forde Dandy, MD  levETIRAcetam (KEPPRA) 500 MG tablet Take 3 tablets (1,500 mg  total) by mouth 2 (two) times daily. 02/10/16  Yes Forde Dandy, MD  midodrine (PROAMATINE) 5 MG tablet Take 1 tablet (5 mg total) by mouth 3 (three) times daily with meals. 01/07/15  Yes Sinda Du, MD  nitroGLYCERIN (NITROSTAT) 0.4 MG SL tablet Place 0.4 mg under the tongue every 5 (five) minutes as needed for chest pain.    Yes [provider]  pantoprazole (PROTONIX) 40 MG tablet Take 40 mg by mouth daily.     Yes [provider]    Scheduled Meds: . aspirin EC  325 mg Oral Daily  . atorvastatin  40 mg Oral QHS  . divalproex  750 mg Oral TID  . enoxaparin (LOVENOX) injection  40 mg Subcutaneous Q24H  . levETIRAcetam  1,500 mg Oral BID  . midodrine  5 mg Oral TID WC  . pantoprazole  40 mg Oral Daily   Continuous Infusions: PRN Meds:.albuterol, LORazepam     Results for orders placed or performed during the  hospital encounter of 07/03/16 (from the past 48 hour(s))  CBC with Differential/Platelet     Status: Abnormal   Collection Time: 07/03/16 11:08 PM  Result Value Ref Range   WBC 12.3 (H) 4.0 - 10.5 K/uL   RBC 5.22 4.22 - 5.81 MIL/uL   Hemoglobin 16.9 13.0 - 17.0 g/dL   HCT 49.5 39.0 - 52.0 %   MCV 94.8 78.0 - 100.0 fL   MCH 32.4 26.0 - 34.0 pg   MCHC 34.1 30.0 - 36.0 g/dL   RDW 13.4 11.5 - 15.5 %   Platelets 195 150 - 400 K/uL   Neutrophils Relative % 78 %   Neutro Abs 9.7 (H) 1.7 - 7.7 K/uL   Lymphocytes Relative 14 %   Lymphs Abs 1.7 0.7 - 4.0 K/uL   Monocytes Relative 8 %   Monocytes Absolute 0.9 0.1 - 1.0 K/uL   Eosinophils Relative 0 %   Eosinophils Absolute 0.0 0.0 - 0.7 K/uL   Basophils Relative 0 %   Basophils Absolute 0.0 0.0 - 0.1 K/uL  Comprehensive metabolic panel     Status: Abnormal   Collection Time: 07/03/16 11:08 PM  Result Value Ref Range   Sodium 139 135 - 145 mmol/L   Potassium 3.8 3.5 - 5.1 mmol/L   Chloride 102 101 - 111 mmol/L   CO2 26 22 - 32 mmol/L   Glucose, Bld 115 (H) 65 - 99 mg/dL   BUN 15 6 - 20 mg/dL    Creatinine, Ser 0.93 0.61 - 1.24 mg/dL   Calcium 9.5 8.9 - 10.3 mg/dL   Total Protein 7.6 6.5 - 8.1 g/dL   Albumin 4.3 3.5 - 5.0 g/dL   AST 19 15 - 41 U/L   ALT 10 (L) 17 - 63 U/L   Alkaline Phosphatase 68 38 - 126 U/L   Total Bilirubin 0.9 0.3 - 1.2 mg/dL   GFR calc non Af Amer >60 >60 mL/min   GFR calc Af Amer >60 >60 mL/min    Comment: (NOTE) The eGFR has been calculated using the CKD EPI equation. This calculation has not been validated in all clinical situations. eGFR's persistently <60 mL/min signify possible Chronic Kidney Disease.    Anion gap 11 5 - 15  Valproic acid level     Status: Abnormal   Collection Time: 07/03/16 11:08 PM  Result Value Ref Range   Valproic Acid Lvl 29 (L) 50.0 - 100.0 ug/mL  Blood culture (routine x 2)     Status: None (Preliminary result)   Collection Time: 07/03/16 11:08 PM  Result Value Ref Range   Specimen Description BLOOD RIGHT ARM    Special Requests      BOTTLES DRAWN AEROBIC AND ANAEROBIC Blood Culture adequate volume   Culture NO GROWTH < 12 HOURS    Report Status PENDING   Troponin I     Status: None   Collection Time: 07/03/16 11:08 PM  Result Value Ref Range   Troponin I <0.03 <0.03 ng/mL  Ammonia     Status: Abnormal   Collection Time: 07/03/16 11:08 PM  Result Value Ref Range   Ammonia 49 (H) 9 - 35 umol/L  Magnesium     Status: None   Collection Time: 07/03/16 11:08 PM  Result Value Ref Range   Magnesium 2.0 1.7 - 2.4 mg/dL  Blood culture (routine x 2)     Status: None (Preliminary result)   Collection Time: 07/03/16 11:15 PM  Result Value Ref Range   Specimen Description  BLOOD RIGHT ARM    Special Requests      BOTTLES DRAWN AEROBIC AND ANAEROBIC Blood Culture adequate volume   Culture NO GROWTH < 12 HOURS    Report Status PENDING   I-Stat CG4 Lactic Acid, ED     Status: None   Collection Time: 07/03/16 11:36 PM  Result Value Ref Range   Lactic Acid, Venous 1.88 0.5 - 1.9 mmol/L  Urinalysis, Routine w reflex  microscopic     Status: Abnormal   Collection Time: 07/04/16 12:08 AM  Result Value Ref Range   Color, Urine YELLOW YELLOW   APPearance CLEAR CLEAR   Specific Gravity, Urine 1.024 1.005 - 1.030   pH 6.0 5.0 - 8.0   Glucose, UA NEGATIVE NEGATIVE mg/dL   Hgb urine dipstick NEGATIVE NEGATIVE   Bilirubin Urine NEGATIVE NEGATIVE   Ketones, ur 20 (A) NEGATIVE mg/dL   Protein, ur 30 (A) NEGATIVE mg/dL   Nitrite NEGATIVE NEGATIVE   Leukocytes, UA NEGATIVE NEGATIVE   RBC / HPF 0-5 0 - 5 RBC/hpf   WBC, UA 0-5 0 - 5 WBC/hpf   Bacteria, UA NONE SEEN NONE SEEN   Squamous Epithelial / LPF NONE SEEN NONE SEEN  Blood gas, arterial ([facility] & [facility] ONLY)     Status: Abnormal   Collection Time: 07/04/16  3:15 AM  Result Value Ref Range   FIO2 28.00    Delivery systems NASAL CANNULA    pH, Arterial 7.426 7.350 - 7.450   pCO2 arterial 39.7 32.0 - 48.0 mmHg   pO2, Arterial 64.5 (L) 83.0 - 108.0 mmHg   Bicarbonate 25.7 20.0 - 28.0 mmol/L   Acid-Base Excess 1.6 0.0 - 2.0 mmol/L   O2 Saturation 91.1 %   Patient temperature 37.1    Collection site RIGHT RADIAL    Drawn by [phone]    Sample type ARTERIAL DRAW    Allens test (pass/fail) PASS PASS  CBG monitoring, ED     Status: None   Collection Time: 07/04/16  3:19 AM  Result Value Ref Range   Glucose-Capillary 98 65 - 99 mg/dL  MRSA PCR Screening     Status: Abnormal   Collection Time: 07/04/16  8:13 AM  Result Value Ref Range   MRSA by PCR INVALID RESULTS, SPECIMEN SENT FOR CULTURE (A) NEGATIVE    Comment:        The GeneXpert MRSA Assay (FDA approved for NASAL specimens only), is one component of a comprehensive MRSA colonization surveillance program. It is not intended to diagnose MRSA infection nor to guide or monitor treatment for MRSA infections.   CBC     Status: Abnormal   Collection Time: 07/04/16  8:16 AM  Result Value Ref Range   WBC 11.8 (H) 4.0 - 10.5 K/uL   RBC 4.86 4.22 - 5.81 MIL/uL   Hemoglobin 15.6 13.0 - 17.0 g/dL   HCT  61.6 88.1 - 57.2 %   MCV 94.7 78.0 - 100.0 fL   MCH 32.1 26.0 - 34.0 pg   MCHC 33.9 30.0 - 36.0 g/dL   RDW 22.8 07.6 - 00.5 %   Platelets 177 150 - 400 K/uL  Creatinine, serum     Status: None   Collection Time: 07/04/16  8:16 AM  Result Value Ref Range   Creatinine, Ser 0.83 0.61 - 1.24 mg/dL   GFR calc non Af Amer >60 >60 mL/min   GFR calc Af Amer >60 >60 mL/min    Comment: (NOTE) The eGFR has been calculated  using the CKD EPI equation. This calculation has not been validated in all clinical situations. eGFR's persistently <60 mL/min signify possible Chronic Kidney Disease.     Studies/Results:  HEAD CT FINDINGS: Brain: No acute territorial infarction, hemorrhage or intracranial mass is seen. Extensive encephalomalacia of the right temporal, frontal and parietal lobes consistent with old infarct. Stable old infarct in the right sub insula. Moderate small vessel ischemic changes of the white matter. Marked atrophy. Stable ventricle size.  Vascular: No hyperdense vessels. Carotid artery calcifications and vertebral artery calcifications.  Skull: No fracture or suspicious bone lesion.  Sinuses/Orbits: Mild mucosal thickening in the sinuses. Chronic deformity of the left maxillary sinus. No acute orbital abnormality.  Other: None  IMPRESSION: 1. No definite CT evidence for acute intracranial abnormality 2. Old large right-sided infarct 3. Atrophy and white matter small vessel ischemic changes       Jesly Hartmann A. Merlene Laughter, M.D.  Diplomate, Tax adviser of Psychiatry and Neurology ( Neurology). 07/04/2016, 6:55 PM

## 2016-07-04 NOTE — Progress Notes (Signed)
This is an assumption of care note. He is known to have seizure disorder and he had more seizures at home. He is in stepdown and is clearly postictal now. Continue with current treatments including Keppra and Depakote

## 2016-07-04 NOTE — Care Management Note (Signed)
Case Management Note  Patient Details  Name: Jeffrey Sweeney MRN: [id number] Date of Birth: [birth year]  Subjective/Objective:                  Adm from home with seizures. From home alone. Walks with RW. Has had HH in the past but not currently. There is some concern whether patient is remembering to take his medication correctly. Discussed with patient and son at bedside. Son states he and his other siblings plan to take turns staying with patient.   Action/Plan: Discussed HH and [facility]N services with patient and son. Both are agreeable to [facility] and [facility] transition calls. CM will order EMMI calls and place [facility] orders closer to time of discharge, anticipate [facility] and PT.    Expected Discharge Date:     07/05/2016             Expected Discharge Plan:  [place]  In-House Referral:     Discharge planning Services  CM Consult  Post Acute Care Choice:  Home Health Choice offered to:  Patient, Adult Children  DME Arranged:    DME Agency:     HH Arranged:  PT [place]:  [facility]  Status of Service:  In process, will continue to follow  If discussed at Long Length of Stay Meetings, dates discussed:    Additional Comments:  Enrigue Hashimi, Chauncey [place], RN 07/04/2016, 2:06 PM

## 2016-07-04 NOTE — H&P (Signed)
TRH H&P    Patient Demographics:    Jeffrey Sweeney, is a 71 y.o. male  MRN: [id number]  DOB - Oct 11, 1945  Admit Date - 07/03/2016  Referring MD/NP/PA: Dr Wyvonnia Dusky  Outpatient Primary MD for the patient is Sinda Du, MD  Patient coming from: home  Chief Complaint  Patient presents with  . Seizures      HPI:    Jeffrey Sweeney  is a 71 y.o. male, With history of seizure was brought to hospital by EMS after patient was found on the floor by son. Patient was not responding. EMS was called and on May patient had another seizure lasting 1 minute in duration at that time he received Ativan. Patient takes Keppra and Depakote at home. And as per son he is not reliable with his medications. Patient has chronic left-sided weakness from previous stroke which is unchanged. In the ED patient was found to be unresponsive after he received Depakote and Keppra. Did not take up to painful stimuli, 1 mg Ativan IV was given and after that patient suddenly woke up and started communicating.  He denies fever, nausea, vomiting, diarrhea. No chest pain or shortness of breath.    Review of systems:     All other systems reviewed and are negative.   With Past History of the following :    Past Medical History:  Diagnosis Date  . AF (atrial fibrillation) ([place])   . Bipolar disorder ([place])   . CAD (coronary artery disease)   . COPD (chronic obstructive pulmonary disease) ([place])   . Depression   . Headache   . Heart disease   . Hypertension   . Seizures ([place])   . Stroke [facility])    left arm weakness      Past Surgical History:  Procedure Laterality Date  . ANKLE FRACTURE SURGERY        Social History:      Social History  Substance Use Topics  . Smoking status: Former Smoker    Packs/day: 0.50    Years: 50.00    Types: Cigarettes    Quit date: 01/07/2014  . Smokeless tobacco: Never Used  . Alcohol use No         Family History :     Family History  Problem Relation Age of Onset  . Cancer Mother   . Cancer Father       Home Medications:   Prior to Admission medications   Medication Sig Start Date End Date Taking? Authorizing Provider  albuterol (PROVENTIL HFA) 108 (90 BASE) MCG/ACT inhaler Inhale 2 puffs into the lungs every 6 (six) hours as needed for wheezing or shortness of breath.    [provider]  ALPRAZolam Duanne Moron) 1 MG tablet Take 1 mg by mouth 4 (four) times daily as needed for anxiety.    [provider]  aspirin EC 325 MG tablet Take 1 tablet (325 mg total) by mouth daily. 02/10/16   Forde Dandy, MD  atorvastatin (LIPITOR) 40 MG tablet Take 40 mg by mouth at bedtime.  [provider]  divalproex (DEPAKOTE) 250 MG DR tablet Take 3 tablets (750 mg total) by mouth 3 (three) times daily. 01/10/16   Sinda Du, MD  divalproex (DEPAKOTE) 250 MG DR tablet Take 3 tablets (750 mg total) by mouth 3 (three) times daily. 02/10/16   Forde Dandy, MD  levETIRAcetam (KEPPRA) 500 MG tablet Take 3 tablets (1,500 mg total) by mouth 2 (two) times daily. 02/10/16   Forde Dandy, MD  levETIRAcetam (KEPPRA) 750 MG tablet Take 2 tablets (1,500 mg total) by mouth 2 (two) times daily. Patient taking differently: Take 1,000 mg by mouth 3 (three) times daily.  01/10/16   Sinda Du, MD  midodrine (PROAMATINE) 5 MG tablet Take 1 tablet (5 mg total) by mouth 3 (three) times daily with meals. 01/07/15   Sinda Du, MD  nitroGLYCERIN (NITROSTAT) 0.4 MG SL tablet Place 0.4 mg under the tongue every 5 (five) minutes as needed for chest pain.     [provider]  pantoprazole (PROTONIX) 40 MG tablet Take 40 mg by mouth daily.      [provider]  valproic acid (DEPAKENE) 250 MG capsule Take 750 mg by mouth 2 (two) times daily.    [provider]     Allergies:     Allergies  Allergen Reactions  . Meperidine Hcl Nausea And Vomiting      Physical Exam:   Vitals  Blood pressure 108/62, pulse (!) 50, temperature 98.8 F (37.1 C), temperature source Rectal, resp. rate 17, SpO2 95 %.  1.  General: Appears in no acute distress  2. Psychiatric: Was initially somnolent but now awake  alert, oriented x 3.  3. Neurologic: Left arm contracted, moving all other extremities. No new neurological deficits noted at this time  4. Eyes :  anicteric sclerae, moist conjunctivae with no lid lag. PERRLA.  5. ENMT:  Oropharynx clear with moist mucous membranes and good dentition  6. Neck:  supple, no cervical lymphadenopathy appriciated, No thyromegaly  7. Respiratory : Normal respiratory effort, good air movement bilaterally,clear to  auscultation bilaterally  8. Cardiovascular : RRR, no gallops, rubs or murmurs, no leg edema  9. Gastrointestinal:  Positive bowel sounds, abdomen soft, non-tender to palpation,no hepatosplenomegaly, no rigidity or guarding       10. Skin:  No cyanosis, normal texture and turgor, no rash, lesions or ulcers  11.Musculoskeletal:  Good muscle tone,  joints appear normal , no effusions,  normal range of motion    Data Review:    CBC  Recent Labs Lab 07/03/16 2308  WBC 12.3*  HGB 16.9  HCT 49.5  PLT 195  MCV 94.8  MCH 32.4  MCHC 34.1  RDW 13.4  LYMPHSABS 1.7  MONOABS 0.9  EOSABS 0.0  BASOSABS 0.0   ------------------------------------------------------------------------------------------------------------------  Chemistries   Recent Labs Lab 07/03/16 2308  NA 139  K 3.8  CL 102  CO2 26  GLUCOSE 115*  BUN 15  CREATININE 0.93  CALCIUM 9.5  MG 2.0  AST 19  ALT 10*  ALKPHOS 68  BILITOT 0.9   ------------------------------------------------------------------------------------------------------------------  ------------------------------------------------------------------------------------------------------------------ GFR: CrCl cannot be calculated (Unknown ideal  weight.). Liver Function Tests:  Recent Labs Lab 07/03/16 2308  AST 19  ALT 10*  ALKPHOS 68  BILITOT 0.9  PROT 7.6  ALBUMIN 4.3   No results for input(s): LIPASE, AMYLASE in the last 168 hours.  Recent Labs Lab 07/03/16 2308  AMMONIA 49*   Coagulation Profile: No results for input(s): INR,  PROTIME in the last 168 hours. Cardiac Enzymes:  Recent Labs Lab 07/03/16 2308  TROPONINI <0.03   BNP (last 3 results) No results for input(s): PROBNP in the last 8760 hours. HbA1C: No results for input(s): HGBA1C in the last 72 hours. CBG:  Recent Labs Lab 07/04/16 0319  GLUCAP 98   Lipid Profile: No results for input(s): CHOL, HDL, LDLCALC, TRIG, CHOLHDL, LDLDIRECT in the last 72 hours. Thyroid Function Tests: No results for input(s): TSH, T4TOTAL, FREET4, T3FREE, THYROIDAB in the last 72 hours. Anemia Panel: No results for input(s): VITAMINB12, FOLATE, FERRITIN, TIBC, IRON, RETICCTPCT in the last 72 hours.  --------------------------------------------------------------------------------------------------------------- Urine analysis:    Component Value Date/Time   COLORURINE YELLOW 07/04/2016 0008   APPEARANCEUR CLEAR 07/04/2016 0008   LABSPEC 1.024 07/04/2016 0008   PHURINE 6.0 07/04/2016 0008   GLUCOSEU NEGATIVE 07/04/2016 0008   HGBUR NEGATIVE 07/04/2016 0008   BILIRUBINUR NEGATIVE 07/04/2016 0008   KETONESUR 20 (A) 07/04/2016 0008   PROTEINUR 30 (A) 07/04/2016 0008   UROBILINOGEN 2.0 (H) 08/10/2011 1731   NITRITE NEGATIVE 07/04/2016 0008   LEUKOCYTESUR NEGATIVE 07/04/2016 0008      Imaging Results:    Dg Chest 1 View  Result Date: 07/03/2016 CLINICAL DATA:  Seizure EXAM: CHEST 1 VIEW COMPARISON:  01/03/2016 FINDINGS: Borderline to mild cardiomegaly. No edema. No pleural effusion or focal pulmonary infiltrate. Aortic atherosclerosis. No pneumothorax. IMPRESSION: Borderline to mild cardiomegaly.  No edema or infiltrate Electronically Signed   By: Donavan Foil M.D.   On: 07/03/2016 23:58   Ct Head Wo Contrast  Result Date: 07/04/2016 CLINICAL DATA:  Seizure EXAM: CT HEAD WITHOUT CONTRAST TECHNIQUE: Contiguous axial images were obtained from the base of the skull through the vertex without intravenous contrast. COMPARISON:  02/10/2016 FINDINGS: Brain: No acute territorial infarction, hemorrhage or intracranial mass is seen. Extensive encephalomalacia of the right temporal, frontal and parietal lobes consistent with old infarct. Stable old infarct in the right sub insula. Moderate small vessel ischemic changes of the white matter. Marked atrophy. Stable ventricle size. Vascular: No hyperdense vessels. Carotid artery calcifications and vertebral artery calcifications. Skull: No fracture or suspicious bone lesion. Sinuses/Orbits: Mild mucosal thickening in the sinuses. Chronic deformity of the left maxillary sinus. No acute orbital abnormality. Other: None IMPRESSION: 1. No definite CT evidence for acute intracranial abnormality 2. Old large right-sided infarct 3. Atrophy and white matter small vessel ischemic changes Electronically Signed   By: Donavan Foil M.D.   On: 07/04/2016 00:15    My personal review of EKG: Rhythm NSR, sinus tachycardia   Assessment & Plan:    Active Problems:   Seizure ([place])   1. Seizure- patient was given 1 g IV Keppra and 500 mg Depakote in the ED. Patient's Depakote level is 29. Will continue with Keppra 1500 mg twice a day, Depakote 750 mg 3 times a day. Will consult neurology for further recommendations. Also start Ativan 1 mg IV every 4 hours when necessary for seizure. 2. Orthostatic hypotension- continue midodrine 5 mg by mouth 3 times a day.    DVT Prophylaxis-   Lovenox   AM Labs Ordered, also please review Full Orders  Family Communication: Admission, patients condition and plan of care including tests being ordered have been discussed with the patient and His son at bedside* who indicate understanding and  agree with the plan and Code Status.  Code Status:  Full code  Admission status: Observation    Time spent in minutes : 60 minutes  Oswald Hillock M.D on 07/04/2016 at 5:29 AM  Between 7am to 7pm - Pager - [phone]. After 7pm go to [email or web address] - password [email or web address]  [facility] - Office  [phone]

## 2016-07-05 DIAGNOSIS — J449 Chronic obstructive pulmonary disease, unspecified: Secondary | ICD-10-CM | POA: Diagnosis present

## 2016-07-05 DIAGNOSIS — Z79899 Other long term (current) drug therapy: Secondary | ICD-10-CM | POA: Diagnosis not present

## 2016-07-05 DIAGNOSIS — G40901 Epilepsy, unspecified, not intractable, with status epilepticus: Secondary | ICD-10-CM | POA: Diagnosis present

## 2016-07-05 DIAGNOSIS — F3162 Bipolar disorder, current episode mixed, moderate: Secondary | ICD-10-CM | POA: Diagnosis present

## 2016-07-05 DIAGNOSIS — I69354 Hemiplegia and hemiparesis following cerebral infarction affecting left non-dominant side: Secondary | ICD-10-CM | POA: Diagnosis not present

## 2016-07-05 DIAGNOSIS — I4891 Unspecified atrial fibrillation: Secondary | ICD-10-CM | POA: Diagnosis present

## 2016-07-05 DIAGNOSIS — K219 Gastro-esophageal reflux disease without esophagitis: Secondary | ICD-10-CM | POA: Diagnosis present

## 2016-07-05 DIAGNOSIS — R569 Unspecified convulsions: Secondary | ICD-10-CM | POA: Diagnosis present

## 2016-07-05 DIAGNOSIS — Z9114 Patient's other noncompliance with medication regimen: Secondary | ICD-10-CM | POA: Diagnosis not present

## 2016-07-05 DIAGNOSIS — Z7982 Long term (current) use of aspirin: Secondary | ICD-10-CM | POA: Diagnosis not present

## 2016-07-05 DIAGNOSIS — I2510 Atherosclerotic heart disease of native coronary artery without angina pectoris: Secondary | ICD-10-CM | POA: Diagnosis present

## 2016-07-05 DIAGNOSIS — I10 Essential (primary) hypertension: Secondary | ICD-10-CM | POA: Diagnosis present

## 2016-07-05 DIAGNOSIS — F329 Major depressive disorder, single episode, unspecified: Secondary | ICD-10-CM | POA: Diagnosis present

## 2016-07-05 LAB — COMPREHENSIVE METABOLIC PANEL
ALBUMIN: 3.7 g/dL (ref 3.5–5.0)
ALK PHOS: 58 U/L (ref 38–126)
ALT: 9 U/L — AB (ref 17–63)
AST: 17 U/L (ref 15–41)
Anion gap: 9 (ref 5–15)
BILIRUBIN TOTAL: 0.9 mg/dL (ref 0.3–1.2)
BUN: 22 mg/dL — AB (ref 6–20)
CALCIUM: 9 mg/dL (ref 8.9–10.3)
CO2: 27 mmol/L (ref 22–32)
CREATININE: 0.88 mg/dL (ref 0.61–1.24)
Chloride: 105 mmol/L (ref 101–111)
GFR calc Af Amer: >60 mL/min (ref >60)
GFR calc non Af Amer: >60 mL/min (ref >60)
GLUCOSE: 97 mg/dL (ref 65–99)
Potassium: 3.8 mmol/L (ref 3.5–5.1)
SODIUM: 141 mmol/L (ref 135–145)
TOTAL PROTEIN: 6.8 g/dL (ref 6.5–8.1)

## 2016-07-05 LAB — CBC
HEMATOCRIT: 46.7 % (ref 39.0–52.0)
HEMOGLOBIN: 15.8 g/dL (ref 13.0–17.0)
MCH: 32.2 pg (ref 26.0–34.0)
MCHC: 33.8 g/dL (ref 30.0–36.0)
MCV: 95.1 fL (ref 78.0–100.0)
Platelets: 172 10*3/uL (ref 150–400)
RBC: 4.91 MIL/uL (ref 4.22–5.81)
RDW: 13.6 % (ref 11.5–15.5)
WBC: 11.1 10*3/uL — AB (ref 4.0–10.5)

## 2016-07-05 LAB — URINE CULTURE: CULTURE: NO GROWTH

## 2016-07-05 NOTE — Progress Notes (Signed)
Subjective: He has not had more episodes of turning to the left and being less responsive noted by the nursing staff overnight. He has been somewhat agitated but he slept through most of the day yesterday. His medications have been adjusted.  Objective: Vital signs in last 24 hours: Temp:  [97.7 F (36.5 C)-98.8 F (37.1 C)] 98.4 F (36.9 C) (06/29 0000) Pulse Rate:  [54-84] 72 (06/29 0600) Resp:  [13-23] 14 (06/29 0600) BP: (91-122)/(48-84) 120/64 (06/29 0600) SpO2:  [89 %-100 %] 89 % (06/29 0600) Weight:  [81.3 kg (179 lb 3.7 oz)] 81.3 kg (179 lb 3.7 oz) (06/28 0802) Weight change:     Intake/Output from previous day: 06/28 0701 - 06/29 0700 In: 240 [P.O.:240] Out: 250 [Urine:250]  PHYSICAL EXAM General appearance: alert, no distress and Sleepy Resp: rhonchi bilaterally Cardio: regular rate and rhythm, S1, S2 normal, no murmur, click, rub or gallop GI: soft, non-tender; bowel sounds normal; no masses,  no organomegaly Extremities: extremities normal, atraumatic, no cyanosis or edema He still has left hemiparesis  Lab Results:  Results for orders placed or performed during the hospital encounter of 07/03/16 (from the past 48 hour(s))  CBC with Differential/Platelet     Status: Abnormal   Collection Time: 07/03/16 11:08 PM  Result Value Ref Range   WBC 12.3 (H) 4.0 - 10.5 K/uL   RBC 5.22 4.22 - 5.81 MIL/uL   Hemoglobin 16.9 13.0 - 17.0 g/dL   HCT 49.5 39.0 - 52.0 %   MCV 94.8 78.0 - 100.0 fL   MCH 32.4 26.0 - 34.0 pg   MCHC 34.1 30.0 - 36.0 g/dL   RDW 13.4 11.5 - 15.5 %   Platelets 195 150 - 400 K/uL   Neutrophils Relative % 78 %   Neutro Abs 9.7 (H) 1.7 - 7.7 K/uL   Lymphocytes Relative 14 %   Lymphs Abs 1.7 0.7 - 4.0 K/uL   Monocytes Relative 8 %   Monocytes Absolute 0.9 0.1 - 1.0 K/uL   Eosinophils Relative 0 %   Eosinophils Absolute 0.0 0.0 - 0.7 K/uL   Basophils Relative 0 %   Basophils Absolute 0.0 0.0 - 0.1 K/uL  Comprehensive metabolic panel     Status:  Abnormal   Collection Time: 07/03/16 11:08 PM  Result Value Ref Range   Sodium 139 135 - 145 mmol/L   Potassium 3.8 3.5 - 5.1 mmol/L   Chloride 102 101 - 111 mmol/L   CO2 26 22 - 32 mmol/L   Glucose, Bld 115 (H) 65 - 99 mg/dL   BUN 15 6 - 20 mg/dL   Creatinine, Ser 0.93 0.61 - 1.24 mg/dL   Calcium 9.5 8.9 - 10.3 mg/dL   Total Protein 7.6 6.5 - 8.1 g/dL   Albumin 4.3 3.5 - 5.0 g/dL   AST 19 15 - 41 U/L   ALT 10 (L) 17 - 63 U/L   Alkaline Phosphatase 68 38 - 126 U/L   Total Bilirubin 0.9 0.3 - 1.2 mg/dL   GFR calc non Af Amer >60 >60 mL/min   GFR calc Af Amer >60 >60 mL/min    Comment: (NOTE) The eGFR has been calculated using the CKD EPI equation. This calculation has not been validated in all clinical situations. eGFR's persistently <60 mL/min signify possible Chronic Kidney Disease.    Anion gap 11 5 - 15  Valproic acid level     Status: Abnormal   Collection Time: 07/03/16 11:08 PM  Result Value Ref Range  Valproic Acid Lvl 29 (L) 50.0 - 100.0 ug/mL  Blood culture (routine x 2)     Status: None (Preliminary result)   Collection Time: 07/03/16 11:08 PM  Result Value Ref Range   Specimen Description BLOOD RIGHT ARM    Special Requests      BOTTLES DRAWN AEROBIC AND ANAEROBIC Blood Culture adequate volume   Culture NO GROWTH < 12 HOURS    Report Status PENDING   Troponin I     Status: None   Collection Time: 07/03/16 11:08 PM  Result Value Ref Range   Troponin I <0.03 <0.03 ng/mL  Ammonia     Status: Abnormal   Collection Time: 07/03/16 11:08 PM  Result Value Ref Range   Ammonia 49 (H) 9 - 35 umol/L  Magnesium     Status: None   Collection Time: 07/03/16 11:08 PM  Result Value Ref Range   Magnesium 2.0 1.7 - 2.4 mg/dL  Blood culture (routine x 2)     Status: None (Preliminary result)   Collection Time: 07/03/16 11:15 PM  Result Value Ref Range   Specimen Description BLOOD RIGHT ARM    Special Requests      BOTTLES DRAWN AEROBIC AND ANAEROBIC Blood Culture  adequate volume   Culture NO GROWTH < 12 HOURS    Report Status PENDING   I-Stat CG4 Lactic Acid, ED     Status: None   Collection Time: 07/03/16 11:36 PM  Result Value Ref Range   Lactic Acid, Venous 1.88 0.5 - 1.9 mmol/L  Urinalysis, Routine w reflex microscopic     Status: Abnormal   Collection Time: 07/04/16 12:08 AM  Result Value Ref Range   Color, Urine YELLOW YELLOW   APPearance CLEAR CLEAR   Specific Gravity, Urine 1.024 1.005 - 1.030   pH 6.0 5.0 - 8.0   Glucose, UA NEGATIVE NEGATIVE mg/dL   Hgb urine dipstick NEGATIVE NEGATIVE   Bilirubin Urine NEGATIVE NEGATIVE   Ketones, ur 20 (A) NEGATIVE mg/dL   Protein, ur 30 (A) NEGATIVE mg/dL   Nitrite NEGATIVE NEGATIVE   Leukocytes, UA NEGATIVE NEGATIVE   RBC / HPF 0-5 0 - 5 RBC/hpf   WBC, UA 0-5 0 - 5 WBC/hpf   Bacteria, UA NONE SEEN NONE SEEN   Squamous Epithelial / LPF NONE SEEN NONE SEEN  Blood gas, arterial ([facility] & [facility] ONLY)     Status: Abnormal   Collection Time: 07/04/16  3:15 AM  Result Value Ref Range   FIO2 28.00    Delivery systems NASAL CANNULA    pH, Arterial 7.426 7.350 - 7.450   pCO2 arterial 39.7 32.0 - 48.0 mmHg   pO2, Arterial 64.5 (L) 83.0 - 108.0 mmHg   Bicarbonate 25.7 20.0 - 28.0 mmol/L   Acid-Base Excess 1.6 0.0 - 2.0 mmol/L   O2 Saturation 91.1 %   Patient temperature 37.1    Collection site RIGHT RADIAL    Drawn by [phone]    Sample type ARTERIAL DRAW    Allens test (pass/fail) PASS PASS  CBG monitoring, ED     Status: None   Collection Time: 07/04/16  3:19 AM  Result Value Ref Range   Glucose-Capillary 98 65 - 99 mg/dL  MRSA PCR Screening     Status: Abnormal   Collection Time: 07/04/16  8:13 AM  Result Value Ref Range   MRSA by PCR INVALID RESULTS, SPECIMEN SENT FOR CULTURE (A) NEGATIVE    Comment:        The GeneXpert  MRSA Assay (FDA approved for NASAL specimens only), is one component of a comprehensive MRSA colonization surveillance program. It is not intended to diagnose  MRSA infection nor to guide or monitor treatment for MRSA infections.   CBC     Status: Abnormal   Collection Time: 07/04/16  8:16 AM  Result Value Ref Range   WBC 11.8 (H) 4.0 - 10.5 K/uL   RBC 4.86 4.22 - 5.81 MIL/uL   Hemoglobin 15.6 13.0 - 17.0 g/dL   HCT 46.0 39.0 - 52.0 %   MCV 94.7 78.0 - 100.0 fL   MCH 32.1 26.0 - 34.0 pg   MCHC 33.9 30.0 - 36.0 g/dL   RDW 13.5 11.5 - 15.5 %   Platelets 177 150 - 400 K/uL  Creatinine, serum     Status: None   Collection Time: 07/04/16  8:16 AM  Result Value Ref Range   Creatinine, Ser 0.83 0.61 - 1.24 mg/dL   GFR calc non Af Amer >60 >60 mL/min   GFR calc Af Amer >60 >60 mL/min    Comment: (NOTE) The eGFR has been calculated using the CKD EPI equation. This calculation has not been validated in all clinical situations. eGFR's persistently <60 mL/min signify possible Chronic Kidney Disease.   CBC     Status: Abnormal   Collection Time: 07/05/16  4:22 AM  Result Value Ref Range   WBC 11.1 (H) 4.0 - 10.5 K/uL   RBC 4.91 4.22 - 5.81 MIL/uL   Hemoglobin 15.8 13.0 - 17.0 g/dL   HCT 46.7 39.0 - 52.0 %   MCV 95.1 78.0 - 100.0 fL   MCH 32.2 26.0 - 34.0 pg   MCHC 33.8 30.0 - 36.0 g/dL   RDW 13.6 11.5 - 15.5 %   Platelets 172 150 - 400 K/uL  Comprehensive metabolic panel     Status: Abnormal   Collection Time: 07/05/16  4:22 AM  Result Value Ref Range   Sodium 141 135 - 145 mmol/L   Potassium 3.8 3.5 - 5.1 mmol/L   Chloride 105 101 - 111 mmol/L   CO2 27 22 - 32 mmol/L   Glucose, Bld 97 65 - 99 mg/dL   BUN 22 (H) 6 - 20 mg/dL   Creatinine, Ser 0.88 0.61 - 1.24 mg/dL   Calcium 9.0 8.9 - 10.3 mg/dL   Total Protein 6.8 6.5 - 8.1 g/dL   Albumin 3.7 3.5 - 5.0 g/dL   AST 17 15 - 41 U/L   ALT 9 (L) 17 - 63 U/L   Alkaline Phosphatase 58 38 - 126 U/L   Total Bilirubin 0.9 0.3 - 1.2 mg/dL   GFR calc non Af Amer >60 >60 mL/min   GFR calc Af Amer >60 >60 mL/min    Comment: (NOTE) The eGFR has been calculated using the CKD EPI  equation. This calculation has not been validated in all clinical situations. eGFR's persistently <60 mL/min signify possible Chronic Kidney Disease.    Anion gap 9 5 - 15    ABGS  Recent Labs  07/04/16 0315  PHART 7.426  PO2ART 64.5*  HCO3 25.7   CULTURES Recent Results (from the past 240 hour(s))  Blood culture (routine x 2)     Status: None (Preliminary result)   Collection Time: 07/03/16 11:08 PM  Result Value Ref Range Status   Specimen Description BLOOD RIGHT ARM  Final   Special Requests   Final    BOTTLES DRAWN AEROBIC AND ANAEROBIC Blood Culture adequate volume  Culture NO GROWTH < 12 HOURS  Final   Report Status PENDING  Incomplete  Blood culture (routine x 2)     Status: None (Preliminary result)   Collection Time: 07/03/16 11:15 PM  Result Value Ref Range Status   Specimen Description BLOOD RIGHT ARM  Final   Special Requests   Final    BOTTLES DRAWN AEROBIC AND ANAEROBIC Blood Culture adequate volume   Culture NO GROWTH < 12 HOURS  Final   Report Status PENDING  Incomplete  MRSA PCR Screening     Status: Abnormal   Collection Time: 07/04/16  8:13 AM  Result Value Ref Range Status   MRSA by PCR INVALID RESULTS, SPECIMEN SENT FOR CULTURE (A) NEGATIVE Final    Comment:        The GeneXpert MRSA Assay (FDA approved for NASAL specimens only), is one component of a comprehensive MRSA colonization surveillance program. It is not intended to diagnose MRSA infection nor to guide or monitor treatment for MRSA infections.    Studies/Results: Dg Chest 1 View  Result Date: 07/03/2016 CLINICAL DATA:  Seizure EXAM: CHEST 1 VIEW COMPARISON:  01/03/2016 FINDINGS: Borderline to mild cardiomegaly. No edema. No pleural effusion or focal pulmonary infiltrate. Aortic atherosclerosis. No pneumothorax. IMPRESSION: Borderline to mild cardiomegaly.  No edema or infiltrate Electronically Signed   By: Donavan Foil M.D.   On: 07/03/2016 23:58   Ct Head Wo Contrast  Result  Date: 07/04/2016 CLINICAL DATA:  Seizure EXAM: CT HEAD WITHOUT CONTRAST TECHNIQUE: Contiguous axial images were obtained from the base of the skull through the vertex without intravenous contrast. COMPARISON:  02/10/2016 FINDINGS: Brain: No acute territorial infarction, hemorrhage or intracranial mass is seen. Extensive encephalomalacia of the right temporal, frontal and parietal lobes consistent with old infarct. Stable old infarct in the right sub insula. Moderate small vessel ischemic changes of the white matter. Marked atrophy. Stable ventricle size. Vascular: No hyperdense vessels. Carotid artery calcifications and vertebral artery calcifications. Skull: No fracture or suspicious bone lesion. Sinuses/Orbits: Mild mucosal thickening in the sinuses. Chronic deformity of the left maxillary sinus. No acute orbital abnormality. Other: None IMPRESSION: 1. No definite CT evidence for acute intracranial abnormality 2. Old large right-sided infarct 3. Atrophy and white matter small vessel ischemic changes Electronically Signed   By: Donavan Foil M.D.   On: 07/04/2016 00:15    Medications:  Prior to Admission:  Prescriptions Prior to Admission  Medication Sig Dispense Refill Last Dose  . albuterol (PROVENTIL HFA) 108 (90 BASE) MCG/ACT inhaler Inhale 2 puffs into the lungs every 6 (six) hours as needed for wheezing or shortness of breath.   01/03/2016 at Unknown time  . ALPRAZolam (XANAX) 1 MG tablet Take 1 mg by mouth 4 (four) times daily as needed for anxiety.   01/03/2016 at Unknown time  . aspirin EC 325 MG tablet Take 1 tablet (325 mg total) by mouth daily. 30 tablet 3   . atorvastatin (LIPITOR) 40 MG tablet Take 40 mg by mouth at bedtime.    01/03/2016 at Unknown time  . divalproex (DEPAKOTE) 250 MG DR tablet Take 3 tablets (750 mg total) by mouth 3 (three) times daily. 180 tablet 2   . levETIRAcetam (KEPPRA) 500 MG tablet Take 3 tablets (1,500 mg total) by mouth 2 (two) times daily. 180 tablet 3   .  midodrine (PROAMATINE) 5 MG tablet Take 1 tablet (5 mg total) by mouth 3 (three) times daily with meals.   01/03/2016 at Unknown time  .  nitroGLYCERIN (NITROSTAT) 0.4 MG SL tablet Place 0.4 mg under the tongue every 5 (five) minutes as needed for chest pain.    unknown  . pantoprazole (PROTONIX) 40 MG tablet Take 40 mg by mouth daily.     01/03/2016 at Unknown time   Scheduled: . aspirin EC  325 mg Oral Daily  . atorvastatin  40 mg Oral QHS  . divalproex  750 mg Oral TID  . enoxaparin (LOVENOX) injection  40 mg Subcutaneous Q24H  . levETIRAcetam  1,500 mg Oral BID  . midodrine  5 mg Oral TID WC  . nicotine  21 mg Transdermal Daily  . pantoprazole  40 mg Oral Daily   Continuous:  OEU:[id number], LORazepam  Assesment: He has seizure disorder which is related to noncompliance. He has had a previous stroke. He has had severe depression in the past. He has been noncompliant with medications and with follow-up. I don't see evidence of atrial fibrillation on EKGs or monitor strips. That is listed as a diagnosis so I will see if I can find documentation of that. I agree that if he does have atrial fibrillation should be anticoagulated but he will have to remain somewhere where he can have his medications administered to him to allow this to be done safely Active Problems:   Seizure [facility])    Plan: Continue current treatments.    LOS: 0 days   , L 07/05/2016, 7:43 AM

## 2016-07-05 NOTE — Care Management Important Message (Signed)
Important Message  Patient Details  Name: Jeffrey Sweeney MRN: [id number] Date of Birth: May 21, 1945   Medicare Important Message Given:  Yes    Sante Biedermann, Chauncey Reading, RN 07/05/2016, 2:43 PM

## 2016-07-05 NOTE — Progress Notes (Signed)
Jeffrey Sweeney became very agitated this afternoon around 2:30. Did not know where he was or what is going on. A sharp contrast from this morning. Vitals have not changed. As of 1630 he still has not become aware of what is going on with care or where he is.

## 2016-07-05 NOTE — Clinical Social Work Note (Signed)
Medication issues are CM issue. CM is aware.   Please reconsult if patient has [name] needs.      Uchenna Rappaport, Clydene Pugh, LCSW

## 2016-07-06 DIAGNOSIS — R4182 Altered mental status, unspecified: Secondary | ICD-10-CM | POA: Diagnosis not present

## 2016-07-06 DIAGNOSIS — R569 Unspecified convulsions: Secondary | ICD-10-CM | POA: Diagnosis not present

## 2016-07-06 LAB — MRSA CULTURE: CULTURE: DETECTED

## 2016-07-06 LAB — VALPROIC ACID LEVEL: VALPROIC ACID LVL: 102 ug/mL — AB (ref 50.0–100.0)

## 2016-07-06 MED ORDER — LACOSAMIDE 200 MG/20ML IV SOLN: 50.0000 mg | Freq: Two times a day (BID) | INTRAVENOUS | Status: DC

## 2016-07-06 MED ORDER — LACOSAMIDE 50 MG PO TABS
50.0000 mg | ORAL_TABLET | Freq: Two times a day (BID) | ORAL | Status: DC
  Administered 2016-07-06 – 2016-07-09 (×7): 50 mg via ORAL
  Filled 2016-07-06 (×7): qty 1

## 2016-07-06 MED ORDER — ALPRAZOLAM 0.5 MG PO TABS: 0.5000 mg | ORAL_TABLET | Freq: Four times a day (QID) | ORAL | Status: DC | PRN

## 2016-07-06 NOTE — Progress Notes (Signed)
Subjective: He says he is okay. However he is confused. He essentially repeats everything that said to him. Nursing staff last night felt that he had more focal seizures. His son is at bedside feeding him. They say that they want him to go home but that he will live with one of his family members. His episode of atrial fib was in the context of acute critical illness in 2013. I don't see any other episodes so I don't think we need to consider anticoagulation at this point.  Objective: Vital signs in last 24 hours: Temp:  [98.5 F (36.9 C)] 98.5 F (36.9 C) (06/29 2000) Pulse Rate:  [52-85] 57 (06/29 1600) Resp:  [10-21] 10 (06/29 1600) BP: (112-145)/(62-70) 115/68 (06/29 1800) SpO2:  [88 %-100 %] 100 % (06/29 1600) Weight change:     Intake/Output from previous day: 06/29 0701 - 06/30 0700 In: 760 [P.O.:760] Out: 200 [Urine:200]  PHYSICAL EXAM General appearance: alert, cooperative, mild distress and Confused Resp: rhonchi bilaterally Cardio: regular rate and rhythm, S1, S2 normal, no murmur, click, rub or gallop GI: soft, non-tender; bowel sounds normal; no masses,  no organomegaly Extremities: extremities normal, atraumatic, no cyanosis or edema Skin warm and dry. He has continued left hemiparesis  Lab Results:  Results for orders placed or performed during the hospital encounter of 07/03/16 (from the past 48 hour(s))  CBC     Status: Abnormal   Collection Time: 07/05/16  4:22 AM  Result Value Ref Range   WBC 11.1 (H) 4.0 - 10.5 K/uL   RBC 4.91 4.22 - 5.81 MIL/uL   Hemoglobin 15.8 13.0 - 17.0 g/dL   HCT 46.7 39.0 - 52.0 %   MCV 95.1 78.0 - 100.0 fL   MCH 32.2 26.0 - 34.0 pg   MCHC 33.8 30.0 - 36.0 g/dL   RDW 13.6 11.5 - 15.5 %   Platelets 172 150 - 400 K/uL  Comprehensive metabolic panel     Status: Abnormal   Collection Time: 07/05/16  4:22 AM  Result Value Ref Range   Sodium 141 135 - 145 mmol/L   Potassium 3.8 3.5 - 5.1 mmol/L   Chloride 105 101 - 111 mmol/L   CO2 27 22 - 32 mmol/L   Glucose, Bld 97 65 - 99 mg/dL   BUN 22 (H) 6 - 20 mg/dL   Creatinine, Ser 0.88 0.61 - 1.24 mg/dL   Calcium 9.0 8.9 - 10.3 mg/dL   Total Protein 6.8 6.5 - 8.1 g/dL   Albumin 3.7 3.5 - 5.0 g/dL   AST 17 15 - 41 U/L   ALT 9 (L) 17 - 63 U/L   Alkaline Phosphatase 58 38 - 126 U/L   Total Bilirubin 0.9 0.3 - 1.2 mg/dL   GFR calc non Af Amer >60 >60 mL/min   GFR calc Af Amer >60 >60 mL/min    Comment: (NOTE) The eGFR has been calculated using the CKD EPI equation. This calculation has not been validated in all clinical situations. eGFR's persistently <60 mL/min signify possible Chronic Kidney Disease.    Anion gap 9 5 - 15  Valproic acid level     Status: Abnormal   Collection Time: 07/06/16  4:57 AM  Result Value Ref Range   Valproic Acid Lvl 102 (H) 50.0 - 100.0 ug/mL    ABGS  Recent Labs  07/04/16 0315  PHART 7.426  PO2ART 64.5*  HCO3 25.7   CULTURES Recent Results (from the past 240 hour(s))  Blood culture (routine x  2)     Status: None (Preliminary result)   Collection Time: 07/03/16 11:08 PM  Result Value Ref Range Status   Specimen Description BLOOD RIGHT ARM  Final   Special Requests   Final    BOTTLES DRAWN AEROBIC AND ANAEROBIC Blood Culture adequate volume   Culture NO GROWTH 2 DAYS  Final   Report Status PENDING  Incomplete  Blood culture (routine x 2)     Status: None (Preliminary result)   Collection Time: 07/03/16 11:15 PM  Result Value Ref Range Status   Specimen Description BLOOD RIGHT ARM  Final   Special Requests   Final    BOTTLES DRAWN AEROBIC AND ANAEROBIC Blood Culture adequate volume   Culture NO GROWTH 2 DAYS  Final   Report Status PENDING  Incomplete  Urine Culture     Status: None   Collection Time: 07/04/16 12:08 AM  Result Value Ref Range Status   Specimen Description URINE, RANDOM  Final   Special Requests NONE  Final   Culture   Final    NO GROWTH Performed at [facility], [place] [address]  [place], [place] [address]    Report Status 07/05/2016 FINAL  Final  MRSA PCR Screening     Status: Abnormal   Collection Time: 07/04/16  8:13 AM  Result Value Ref Range Status   MRSA by PCR INVALID RESULTS, SPECIMEN SENT FOR CULTURE (A) NEGATIVE Final    Comment:        The GeneXpert MRSA Assay (FDA approved for NASAL specimens only), is one component of a comprehensive MRSA colonization surveillance program. It is not intended to diagnose MRSA infection nor to guide or monitor treatment for MRSA infections.   MRSA culture     Status: None (Preliminary result)   Collection Time: 07/04/16  8:13 AM  Result Value Ref Range Status   Specimen Description NASAL SWAB  Final   Special Requests NONE  Final   Culture   Final    TOO YOUNG TO READ Performed at [place] [address] [place], [place] [address]    Report Status PENDING  Incomplete   Studies/Results: No results found.  Medications:  Prior to Admission:  Prescriptions Prior to Admission  Medication Sig Dispense Refill Last Dose  . albuterol (PROVENTIL HFA) 108 (90 BASE) MCG/ACT inhaler Inhale 2 puffs into the lungs every 6 (six) hours as needed for wheezing or shortness of breath.   01/03/2016 at Unknown time  . ALPRAZolam (XANAX) 1 MG tablet Take 1 mg by mouth 4 (four) times daily as needed for anxiety.   01/03/2016 at Unknown time  . aspirin EC 325 MG tablet Take 1 tablet (325 mg total) by mouth daily. 30 tablet 3   . atorvastatin (LIPITOR) 40 MG tablet Take 40 mg by mouth at bedtime.    01/03/2016 at Unknown time  . divalproex (DEPAKOTE) 250 MG DR tablet Take 3 tablets (750 mg total) by mouth 3 (three) times daily. 180 tablet 2   . levETIRAcetam (KEPPRA) 500 MG tablet Take 3 tablets (1,500 mg total) by mouth 2 (two) times daily. 180 tablet 3   . midodrine (PROAMATINE) 5 MG tablet Take 1 tablet (5 mg total) by mouth 3 (three) times daily with meals.   01/03/2016 at Unknown time  . nitroGLYCERIN (NITROSTAT) 0.4 MG  SL tablet Place 0.4 mg under the tongue every 5 (five) minutes as needed for chest pain.    unknown  . pantoprazole (PROTONIX)  40 MG tablet Take 40 mg by mouth daily.     01/03/2016 at Unknown time   Scheduled: . aspirin EC  325 mg Oral Daily  . atorvastatin  40 mg Oral QHS  . divalproex  750 mg Oral TID  . enoxaparin (LOVENOX) injection  40 mg Subcutaneous Q24H  . lacosamide  50 mg Oral BID  . levETIRAcetam  1,500 mg Oral BID  . midodrine  5 mg Oral TID WC  . nicotine  21 mg Transdermal Daily  . pantoprazole  40 mg Oral Daily   Continuous:  [id number], LORazepam  Assesment: He was admitted with status epilepticus. He has had a previous stroke. There was concern about atrial fib but it looks like that was a single episode in the context of critical illness so I don't think we need to anticoagulate him at this point. He's having significant confusion and he was on Xanax chronically at home so I'm going to restart that Active Problems:   Status epilepticus ([place])   Seizure ([place])    Plan: Continue current treatments otherwise    LOS: 1 day   Buell Parcel L 07/06/2016, 8:58 AM

## 2016-07-07 MED ORDER — CHLORHEXIDINE GLUCONATE CLOTH 2 % EX PADS
6.0000 | MEDICATED_PAD | Freq: Every day | CUTANEOUS | Status: DC
  Administered 2016-07-08 – 2016-07-09 (×2): 6 via TOPICAL

## 2016-07-07 MED ORDER — MUPIROCIN 2 % EX OINT
TOPICAL_OINTMENT | Freq: Two times a day (BID) | CUTANEOUS | Status: DC
  Administered 2016-07-07 – 2016-07-09 (×5): via NASAL
  Filled 2016-07-07: qty 22

## 2016-07-07 NOTE — Progress Notes (Signed)
Hand-off report given to [facility], RN in preparation for impending transfer to [facility].

## 2016-07-07 NOTE — Progress Notes (Signed)
Subjective: He is much more awake and alert this morning. No further seizure activity noted. No new complaints. He knows he is in the hospital. He knows who I am today. His son is at bedside and we discussed his overall situation. All involved understand that he is going to need supervision at home  Objective: Vital signs in last 24 hours: Temp:  [97.5 F (36.4 C)-98.3 F (36.8 C)] 97.8 F (36.6 C) (07/01 0400) Pulse Rate:  [46-162] 47 (07/01 0500) Resp:  [10-22] 11 (07/01 0500) BP: (113-157)/(62-98) 134/85 (07/01 0500) SpO2:  [68 %-96 %] 95 % (07/01 0500) Weight change:     Intake/Output from previous day: 06/30 0701 - 07/01 0700 In: 440 [P.O.:440] Out: 250 [Urine:250]  PHYSICAL EXAM General appearance: alert, cooperative, mild distress and Still confused but that her Resp: clear to auscultation bilaterally Cardio: regular rate and rhythm, S1, S2 normal, no murmur, click, rub or gallop GI: soft, non-tender; bowel sounds normal; no masses,  no organomegaly Extremities: extremities normal, atraumatic, no cyanosis or edema Skin warm and dry. Chronic left hemiparesis is unchanged  Lab Results:  Results for orders placed or performed during the hospital encounter of 07/03/16 (from the past 48 hour(s))  Valproic acid level     Status: Abnormal   Collection Time: 07/06/16  4:57 AM  Result Value Ref Range   Valproic Acid Lvl 102 (H) 50.0 - 100.0 ug/mL    ABGS No results for input(s): PHART, PO2ART, TCO2, HCO3 in the last 72 hours.  Invalid input(s): PCO2 CULTURES Recent Results (from the past 240 hour(s))  Blood culture (routine x 2)     Status: None (Preliminary result)   Collection Time: 07/03/16 11:08 PM  Result Value Ref Range Status   Specimen Description BLOOD RIGHT ARM  Final   Special Requests   Final    BOTTLES DRAWN AEROBIC AND ANAEROBIC Blood Culture adequate volume   Culture NO GROWTH 4 DAYS  Final   Report Status PENDING  Incomplete  Blood culture (routine x  2)     Status: None (Preliminary result)   Collection Time: 07/03/16 11:15 PM  Result Value Ref Range Status   Specimen Description BLOOD RIGHT ARM  Final   Special Requests   Final    BOTTLES DRAWN AEROBIC AND ANAEROBIC Blood Culture adequate volume   Culture NO GROWTH 4 DAYS  Final   Report Status PENDING  Incomplete  Urine Culture     Status: None   Collection Time: 07/04/16 12:08 AM  Result Value Ref Range Status   Specimen Description URINE, RANDOM  Final   Special Requests NONE  Final   Culture   Final    NO GROWTH Performed at [facility], [place] [address], [place], [place] [address]    Report Status 07/05/2016 FINAL  Final  MRSA PCR Screening     Status: Abnormal   Collection Time: 07/04/16  8:13 AM  Result Value Ref Range Status   MRSA by PCR INVALID RESULTS, SPECIMEN SENT FOR CULTURE (A) NEGATIVE Final    Comment:        The GeneXpert MRSA Assay (FDA approved for NASAL specimens only), is one component of a comprehensive MRSA colonization surveillance program. It is not intended to diagnose MRSA infection nor to guide or monitor treatment for MRSA infections.   MRSA culture     Status: None   Collection Time: 07/04/16  8:13 AM  Result Value Ref Range Status   Specimen Description NASAL SWAB  Final   Special Requests NONE  Final   Culture   Final    MRSA DETECTED Performed at [facility], [place] [address] [place], [place] [address]    Report Status 07/06/2016 FINAL  Final   Studies/Results: No results found.  Medications:  Prior to Admission:  Prescriptions Prior to Admission  Medication Sig Dispense Refill Last Dose  . albuterol (PROVENTIL HFA) 108 (90 BASE) MCG/ACT inhaler Inhale 2 puffs into the lungs every 6 (six) hours as needed for wheezing or shortness of breath.   01/03/2016 at Unknown time  . ALPRAZolam (XANAX) 1 MG tablet Take 1 mg by mouth 4 (four) times daily as needed for anxiety.   01/03/2016 at Unknown time  . aspirin EC 325 MG  tablet Take 1 tablet (325 mg total) by mouth daily. 30 tablet 3   . atorvastatin (LIPITOR) 40 MG tablet Take 40 mg by mouth at bedtime.    01/03/2016 at Unknown time  . divalproex (DEPAKOTE) 250 MG DR tablet Take 3 tablets (750 mg total) by mouth 3 (three) times daily. 180 tablet 2   . levETIRAcetam (KEPPRA) 500 MG tablet Take 3 tablets (1,500 mg total) by mouth 2 (two) times daily. 180 tablet 3   . midodrine (PROAMATINE) 5 MG tablet Take 1 tablet (5 mg total) by mouth 3 (three) times daily with meals.   01/03/2016 at Unknown time  . nitroGLYCERIN (NITROSTAT) 0.4 MG SL tablet Place 0.4 mg under the tongue every 5 (five) minutes as needed for chest pain.    unknown  . pantoprazole (PROTONIX) 40 MG tablet Take 40 mg by mouth daily.     01/03/2016 at Unknown time   Scheduled: . aspirin EC  325 mg Oral Daily  . atorvastatin  40 mg Oral QHS  . divalproex  750 mg Oral TID  . enoxaparin (LOVENOX) injection  40 mg Subcutaneous Q24H  . lacosamide  50 mg Oral BID  . levETIRAcetam  1,500 mg Oral BID  . midodrine  5 mg Oral TID WC  . nicotine  21 mg Transdermal Daily  . pantoprazole  40 mg Oral Daily   Continuous:  NOB:[id number], ALPRAZolam, LORazepam  Assesment: He was made with status epilepticus likely from not being on his regular meds. He has had a previous severe stroke which left him with a left hemiparesis. He has had significant altered mental status. He has had seizures in the past. He is better today. He is more alert. Active Problems:   Status epilepticus ([place])   Seizure ([place])    Plan: Continue other treatment. Transfer out of the stepdown.    LOS: 2 days   Rheanne Cortopassi L 07/07/2016, 9:28 AM

## 2016-07-08 ENCOUNTER — Inpatient Hospital Stay (HOSPITAL_COMMUNITY)
Admit: 2016-07-08 | Discharge: 2016-07-08 | Disposition: A | Discharge status: Home / Self Care | Payer: Medicare HMO | Attending: Neurology | Admitting: Neurology

## 2016-07-08 DIAGNOSIS — I6930 Unspecified sequelae of cerebral infarction: Secondary | ICD-10-CM

## 2016-07-08 DIAGNOSIS — I69354 Hemiplegia and hemiparesis following cerebral infarction affecting left non-dominant side: Secondary | ICD-10-CM

## 2016-07-08 LAB — CULTURE, BLOOD (ROUTINE X 2)
Culture: NO GROWTH
Culture: NO GROWTH
SPECIAL REQUESTS: ADEQUATE
SPECIAL REQUESTS: ADEQUATE

## 2016-07-08 NOTE — Evaluation (Signed)
Physical Therapy Evaluation Patient Details Name: Jeffrey Sweeney MRN: [id number] DOB: 1945-02-12 Today's Date: 07/08/2016   History of Present Illness  71 yo male with onset of a-fib, contact precautions from MRSA exposure, seizure risk and weakness was admitted for confusion, seizures, and has PMHx:  HTN, GERD, bipolar disorder and stroke with L hemi  Clinical Impression  Pt is evaluated for his control of L side for standing and sitting balance.  He has tolerated minimal standing, but struggles with control of L side, has poor awareness of use of L arm and does not weight bear on LLE effectively.  Pt is unable to walk, recommend him to SNF to restore independence of gait and standing toward a return home if possible.  Follow acutely for these goals, plan to transition to the SNF when released by MD.    Follow Up Recommendations SNF    Equipment Recommendations  None recommended by PT    Recommendations for Other Services       Precautions / Restrictions Precautions Precautions: Fall Restrictions Weight Bearing Restrictions: No Other Position/Activity Restrictions: L hemi restricts standing on LLE and use of L hand to hold walker      Mobility  Bed Mobility Overal bed mobility: Needs Assistance Bed Mobility: Supine to Sit;Sit to Supine     Supine to sit: Mod assist Sit to supine: Mod assist      Transfers Overall transfer level: Needs assistance Equipment used: Rolling walker (2 wheeled);1 person hand held assist Transfers: Sit to/from Stand Sit to Stand: Mod assist;Max assist            Ambulation/Gait             General Gait Details: unable to take a step  Stairs            Wheelchair Mobility    Modified Rankin (Stroke Patients Only) Modified Rankin (Stroke Patients Only) Pre-Morbid Rankin Score: Moderate disability Modified Rankin: Moderately severe disability     Balance Overall balance assessment: Needs assistance Sitting-balance  support: Feet supported Sitting balance-Leahy Scale: Fair     Standing balance support: Bilateral upper extremity supported Standing balance-Leahy Scale: Poor                               Pertinent Vitals/Pain Pain Assessment: No/denies pain    Home Living Family/patient expects to be discharged to:: Unsure Living Arrangements: Alone                    Prior Function Level of Independence: Independent with assistive device(s)         Comments: used RW per pt but is confused about history     Hand Dominance   Dominant Hand: Right    Extremity/Trunk Assessment   Upper Extremity Assessment Upper Extremity Assessment: LUE deficits/detail LUE Deficits / Details: Hemi flexor tone with difficulty releasing L hand to open LUE Sensation: decreased proprioception LUE Coordination: decreased fine motor;decreased gross motor    Lower Extremity Assessment Lower Extremity Assessment: Generalized weakness    Cervical / Trunk Assessment Cervical / Trunk Assessment: Kyphotic  Communication   Communication: Receptive difficulties;Other (comment)  Cognition Arousal/Alertness: Lethargic Behavior During Therapy: Flat affect Overall Cognitive Status: History of cognitive impairments - at baseline  General Comments      Exercises     Assessment/Plan    PT Assessment Patient needs continued PT services  PT Problem List Decreased strength;Decreased range of motion;Decreased activity tolerance;Decreased balance;Decreased mobility;Decreased coordination;Decreased cognition;Decreased knowledge of use of DME;Decreased safety awareness;Cardiopulmonary status limiting activity       PT Treatment Interventions DME instruction;Gait training;Stair training;Functional mobility training;Therapeutic activities;Neuromuscular re-education;Therapeutic exercise;Balance training;Patient/family education    PT Goals  (Current goals can be found in the Care Plan section)  Acute Rehab PT Goals Patient Stated Goal: none stated PT Goal Formulation: Patient unable to participate in goal setting Time For Goal Achievement: 07/22/16 Potential to Achieve Goals: Good    Frequency Min 3X/week   Barriers to discharge Decreased caregiver support (home alone)      Co-evaluation               AM-PAC PT "6 Clicks" Daily Activity  Outcome Measure Difficulty turning over in bed (including adjusting bedclothes, sheets and blankets)?: Total Difficulty moving from lying on back to sitting on the side of the bed? : Total Difficulty sitting down on and standing up from a chair with arms (e.g., wheelchair, bedside commode, etc,.)?: Total Help needed moving to and from a bed to chair (including a wheelchair)?: A Lot Help needed walking in hospital room?: Total Help needed climbing 3-5 steps with a railing? : Total 6 Click Score: 7    End of Session Equipment Utilized During Treatment: Gait belt Activity Tolerance: Patient limited by fatigue;Patient limited by lethargy Patient left: in bed;with call bell/phone within reach;with bed alarm set Nurse Communication: Mobility status PT Visit Diagnosis: Unsteadiness on feet (R26.81);Repeated falls (R29.6);Muscle weakness (generalized) (M62.81);Difficulty in walking, not elsewhere classified (R26.2)    Time: 3474-2595 PT Time Calculation (min) (ACUTE ONLY): 22 min   Charges:   PT Evaluation $PT Eval Moderate Complexity: 1 Procedure     PT G Codes:   PT G-Codes **NOT FOR INPATIENT CLASS** Functional Assessment Tool Used: AM-PAC 6 Clicks Basic Mobility    Ramond Dial 07/08/2016, 9:01 PM   9:09 PM, 07/08/16 Mee Hives, PT, MS Physical Therapist - [place] [phone] (Office)

## 2016-07-08 NOTE — NC FL2 (Signed)
[place] LEVEL OF CARE SCREENING TOOL     IDENTIFICATION  Patient Name: Jeffrey Sweeney Birthdate: Mar 03, 1945 Sex: male Admission Date (Current Location): 07/03/2016  [facility] [place] Number:  [employer] and Address:  [place] [address], [place]      Provider Number: [id number]  Attending Physician Name and Address:  Sinda Du, MD  Relative Name and Phone Number:       Current Level of Care: Hospital Recommended Level of Care: [place] Prior Approval Number:    Date Approved/Denied:   PASRR Number:    Discharge Plan: SNF    Current Diagnoses: Patient Active Problem List   Diagnosis Date Noted  . Hemiparesis affecting left side as late effect of cerebrovascular accident (CVA) ([place]) 07/08/2016  . Personal history of stroke with current residual effects 07/08/2016  . Seizure ([place]) 07/04/2016  . Status epilepticus ([place]) 01/09/2016  . HCAP (healthcare-associated pneumonia) 01/11/2015  . Fever 01/10/2015  . Osteoporosis 01/07/2015  . Compression fracture of lumbar spine, non-traumatic ([place]) 01/07/2015  . COPD exacerbation ([place]) 01/05/2015  . Falls 01/05/2015  . Hypokalemia 08/14/2011  . DVT (deep venous thrombosis) ([place]) 08/14/2011  . Atrial fibrillation ([place]) 08/13/2011  . Rib fractures 08/07/2011  . Respiratory distress 08/07/2011  . COPD (chronic obstructive pulmonary disease) ([place]) 08/07/2011  . Atelectasis 08/07/2011  . H/O chest tube placement 08/07/2011  . Hemothorax 08/07/2011  . Hyperlipidemia 07/17/2010  . ANXIETY DEPRESSION 07/17/2010  . Essential hypertension 07/17/2010  . Coronary atherosclerosis 07/17/2010  . CORONARY ATHEROSCLEROSIS NATIVE CORONARY ARTERY 07/17/2010  . Stroke ([place]) 07/17/2010  . CEREBROVASCULAR DISEASE 07/17/2010  . GERD 07/17/2010  . Seizure disorder ([place]) 07/17/2010  . SPLENIC INFARCTION 07/05/2009  . EMBOLISM&THROMBOSIS OF OTHER SPECIFIED  ARTERY 07/05/2009  . EARLY SATIETY 07/05/2009  . WEIGHT LOSS, ABNORMAL 07/05/2009  . DIARRHEA 07/05/2009  . LUQ PAIN 07/05/2009  . PERSONAL HISTORY OF COLONIC POLYPS 07/05/2009  . Orthostatic hypotension 05/01/2009  . SYNCOPE 05/01/2009  . CHEST PAIN 05/01/2009  . LOW BACK PAIN, MILD 04/20/2009  . Bipolar 1 disorder, mixed, moderate ([place]) 04/20/2009    Orientation RESPIRATION BLADDER Height & Weight     Self, Place  O2 (2L) Incontinent Weight: 179 lb 3.7 oz (81.3 kg) Height:  5\' 11"  (180.3 cm)  BEHAVIORAL SYMPTOMS/MOOD NEUROLOGICAL BOWEL NUTRITION STATUS    Convulsions/Seizures Continent Diet (See DC summary for orders)  AMBULATORY STATUS COMMUNICATION OF NEEDS Skin   Extensive Assist Verbally Normal                       Personal Care Assistance Level of Assistance  Bathing, Feeding, Dressing Bathing Assistance: Limited assistance Feeding assistance: Limited assistance Dressing Assistance: Limited assistance     Functional Limitations Info  Sight, Hearing, Speech Sight Info: Adequate Hearing Info: Adequate Speech Info: Adequate    SPECIAL CARE FACTORS FREQUENCY  PT (By licensed PT), OT (By licensed OT)     PT Frequency: 5x OT Frequency: 5x            Contractures Contractures Info: Not present    Additional Factors Info  Code Status, Allergies, Psychotropic, Isolation Precautions Code Status Info: Full Code Allergies Info: Meperidine Hcl Psychotropic Info: Bipolar dx (see med list)   Isolation Precautions Info: Contact: MRSA     Current Medications (07/08/2016):  This is the current hospital active medication list Current Facility-Administered Medications  Medication Dose Route Frequency Provider Last Rate  Last Dose  . albuterol (PROVENTIL) (2.5 MG/3ML) 0.083% nebulizer solution 2.5 mg  2.5 mg Nebulization Q6H PRN Sinda Du, MD      . ALPRAZolam Duanne Moron) tablet 0.5 mg  0.5 mg Oral QID PRN Sinda Du, MD      . aspirin EC tablet 325 mg  325  mg Oral Daily Oswald Hillock, MD   325 mg at 07/08/16 1111  . atorvastatin (LIPITOR) tablet 40 mg  40 mg Oral QHS Oswald Hillock, MD   40 mg at 07/07/16 2213  . Chlorhexidine Gluconate Cloth 2 % PADS 6 each  6 each Topical Q0600 Sinda Du, MD   6 each at 07/08/16 [phone]  . divalproex (DEPAKOTE) DR tablet 750 mg  750 mg Oral TID Oswald Hillock, MD   750 mg at 07/08/16 1111  . enoxaparin (LOVENOX) injection 40 mg  40 mg Subcutaneous Q24H Oswald Hillock, MD   40 mg at 07/08/16 0829  . lacosamide (VIMPAT) tablet 50 mg  50 mg Oral BID Sinda Du, MD   50 mg at 07/08/16 1111  . levETIRAcetam (KEPPRA) tablet 1,500 mg  1,500 mg Oral BID Oswald Hillock, MD   1,500 mg at 07/08/16 1110  . LORazepam (ATIVAN) injection 1 mg  1 mg Intravenous Q4H PRN Oswald Hillock, MD   1 mg at 07/05/16 2208  . midodrine (PROAMATINE) tablet 5 mg  5 mg Oral TID WC Oswald Hillock, MD   5 mg at 07/08/16 1226  . mupirocin ointment (BACTROBAN) 2 %   Nasal BID Sinda Du, MD      . nicotine (NICODERM CQ - dosed in mg/24 hours) patch 21 mg  21 mg Transdermal Daily Jani Gravel, MD   21 mg at 07/08/16 1111  . pantoprazole (PROTONIX) EC tablet 40 mg  40 mg Oral Daily Oswald Hillock, MD   40 mg at 07/08/16 1111     Discharge Medications: Please see discharge summary for a list of discharge medications.  Relevant Imaging Results:  Relevant Lab Results:   Additional Information SS#: [id number]  [name], LCSW

## 2016-07-08 NOTE — Progress Notes (Signed)
Subjective: He just finished EEG. Results are of course pending. He has no new complaints. He's a little more confused this morning. No seizures noted.  Objective: Vital signs in last 24 hours: Temp:  [98.1 F (36.7 C)-98.6 F (37 C)] 98.1 F (36.7 C) (07/02 0551) Pulse Rate:  [50-118] 118 (07/02 0551) Resp:  [18] 18 (07/02 0551) BP: (124-150)/(64-78) 150/72 (07/02 0551) SpO2:  [93 %-97 %] 97 % (07/02 0551) Weight change:  Last BM Date: 07/07/16  Intake/Output from previous day: 07/01 0701 - 07/02 0700 In: 822 [P.O.:822] Out: -   PHYSICAL EXAM General appearance: alert, cooperative and no distress Resp: clear to auscultation bilaterally Cardio: regular rate and rhythm, S1, S2 normal, no murmur, click, rub or gallop GI: soft, non-tender; bowel sounds normal; no masses,  no organomegaly Extremities: extremities normal, atraumatic, no cyanosis or edema He has left hemiparesis which is unchanged. He is slightly more confused than yesterday  Lab Results:  No results found for this or any previous visit (from the past 48 hour(s)).  ABGS No results for input(s): PHART, PO2ART, TCO2, HCO3 in the last 72 hours.  Invalid input(s): PCO2 CULTURES Recent Results (from the past 240 hour(s))  Blood culture (routine x 2)     Status: None (Preliminary result)   Collection Time: 07/03/16 11:08 PM  Result Value Ref Range Status   Specimen Description BLOOD RIGHT ARM  Final   Special Requests   Final    BOTTLES DRAWN AEROBIC AND ANAEROBIC Blood Culture adequate volume   Culture NO GROWTH 4 DAYS  Final   Report Status PENDING  Incomplete  Blood culture (routine x 2)     Status: None (Preliminary result)   Collection Time: 07/03/16 11:15 PM  Result Value Ref Range Status   Specimen Description BLOOD RIGHT ARM  Final   Special Requests   Final    BOTTLES DRAWN AEROBIC AND ANAEROBIC Blood Culture adequate volume   Culture NO GROWTH 4 DAYS  Final   Report Status PENDING  Incomplete   Urine Culture     Status: None   Collection Time: 07/04/16 12:08 AM  Result Value Ref Range Status   Specimen Description URINE, RANDOM  Final   Special Requests NONE  Final   Culture   Final    NO GROWTH Performed at [facility], [place] [address], [place], [place] [address]    Report Status 07/05/2016 FINAL  Final  MRSA PCR Screening     Status: Abnormal   Collection Time: 07/04/16  8:13 AM  Result Value Ref Range Status   MRSA by PCR INVALID RESULTS, SPECIMEN SENT FOR CULTURE (A) NEGATIVE Final    Comment:        The GeneXpert MRSA Assay (FDA approved for NASAL specimens only), is one component of a comprehensive MRSA colonization surveillance program. It is not intended to diagnose MRSA infection nor to guide or monitor treatment for MRSA infections.   MRSA culture     Status: None   Collection Time: 07/04/16  8:13 AM  Result Value Ref Range Status   Specimen Description NASAL SWAB  Final   Special Requests NONE  Final   Culture   Final    MRSA DETECTED Performed at [facility], [address], [place], [place] [address]    Report Status 07/06/2016 FINAL  Final   Studies/Results: No results found.  Medications:  Prior to Admission:  Prescriptions Prior to Admission  Medication Sig Dispense Refill Last Dose  .  albuterol (PROVENTIL HFA) 108 (90 BASE) MCG/ACT inhaler Inhale 2 puffs into the lungs every 6 (six) hours as needed for wheezing or shortness of breath.   01/03/2016 at Unknown time  . ALPRAZolam (XANAX) 1 MG tablet Take 1 mg by mouth 4 (four) times daily as needed for anxiety.   01/03/2016 at Unknown time  . aspirin EC 325 MG tablet Take 1 tablet (325 mg total) by mouth daily. 30 tablet 3   . atorvastatin (LIPITOR) 40 MG tablet Take 40 mg by mouth at bedtime.    01/03/2016 at Unknown time  . divalproex (DEPAKOTE) 250 MG DR tablet Take 3 tablets (750 mg total) by mouth 3 (three) times daily. 180 tablet 2   . levETIRAcetam (KEPPRA) 500 MG tablet  Take 3 tablets (1,500 mg total) by mouth 2 (two) times daily. 180 tablet 3   . midodrine (PROAMATINE) 5 MG tablet Take 1 tablet (5 mg total) by mouth 3 (three) times daily with meals.   01/03/2016 at Unknown time  . nitroGLYCERIN (NITROSTAT) 0.4 MG SL tablet Place 0.4 mg under the tongue every 5 (five) minutes as needed for chest pain.    unknown  . pantoprazole (PROTONIX) 40 MG tablet Take 40 mg by mouth daily.     01/03/2016 at Unknown time   Scheduled: . aspirin EC  325 mg Oral Daily  . atorvastatin  40 mg Oral QHS  . Chlorhexidine Gluconate Cloth  6 each Topical Q0600  . divalproex  750 mg Oral TID  . enoxaparin (LOVENOX) injection  40 mg Subcutaneous Q24H  . lacosamide  50 mg Oral BID  . levETIRAcetam  1,500 mg Oral BID  . midodrine  5 mg Oral TID WC  . mupirocin ointment   Nasal BID  . nicotine  21 mg Transdermal Daily  . pantoprazole  40 mg Oral Daily   Continuous:  JYN:[id number], ALPRAZolam, LORazepam  Assesment:He was admitted with status epilepticus. This is related to noncompliance with medications. He is better. He had EEG just now. He has had a previous stroke. He did have an episode of atrial fibrillation was related to acute critical illness so I don't think he needs to be anticoagulated right now. He has reflux which is stable. He has had problems with bipolar disease in the past. His family understands that he's going to need to go live with someone and that may be accomplished as early as tomorrow Active Problems:   Essential hypertension   GERD   Status epilepticus ([place])   Seizure ([place])    Plan:Continue current medications and treatments. Potential discharge tomorrow    LOS: 3 days   Lailana Shira L 07/08/2016, 8:40 AM

## 2016-07-08 NOTE — Progress Notes (Signed)
EEG Completed; Results Pending  

## 2016-07-08 NOTE — Clinical Social Work Note (Signed)
Clinical Social Work Assessment  Patient Details  Name: Jeffrey Sweeney MRN: [id number] Date of Birth: 10-14-1945  Date of referral:  07/08/16               Reason for consult:  Facility Placement, Discharge Planning                Permission sought to share information with:  Case Manager, Customer service manager, Family Supports Permission granted to share information::  Yes, Verbal Permission Granted  Name::        Agency::     Relationship::  Brother:  Jeffrey Sweeney, daughter, other son, grandson  Sport and exercise psychologist Information:     Housing/Transportation Living arrangements for the past 2 months:  Single Family Home Source of Information:  Patient, Medical Team, Case Manager, Adult Children, Facility Patient Interpreter Needed:  None Criminal Activity/Legal Involvement Pertinent to Current Situation/Hospitalization:  No - Comment as needed Significant Relationships:  Adult Children, Other Family Members Lives with:  Self Do you feel safe going back to the place where you live?  Yes Need for family participation in patient care:  Yes (Comment) (family working to provide additional supervision for patient)  Care giving concerns:  Patient admitted from home, alone after seizure.  Patient has multiple family members working to assist with care of patient, but also open to [place] rehab as being recommended by PT.  Patient was at SNF earlier in the year 2018 for three weeks (at [facility]).  Patient completed rehab and went back home where he has been living independently and able to cook and clean, wash his clothing up until admission.  Family fearful of leaving him alone due to seizure and establishing a care plan for him vs going to rehab.   Social Worker assessment / plan:  LCSW completed assessment with POA as patient pleasantly confused.  POA Jeffrey Sweeney reports plan is to take patient home as long as family (other sons) are able to provide care and assist with supervision of  patient.  LCSW provided education for family regarding recommendations and family open to facility placement, but are hopeful to take him home if at all possible.    LCSW has been given permission to fax out for bed offers and follow up with Jeffrey Sweeney once he has made contact with family.  FL2 completed [place] completed due to Bipolar.  30 day note needed to be signed and sent in.   Insurance [name] is needed as well for authorization to SNF.  Will follow up with POA regarding plans.  Employment status:  Retired Nurse, adult PT Recommendations:  [place], [place] / Referral to community resources:  [place]  Patient/Family's Response to care:  Understanding  Patient/Family's Understanding of and Emotional Response to Diagnosis, Current Treatment, and Prognosis:  Family working to establish care plan for patient in order to keep him home.  Family aware of 24 hour supervision and needs and realistic/open to SNF if unable to provide needs for patient.   Emotional Assessment Appearance:  Appears stated age Attitude/Demeanor/Rapport:    Affect (typically observed):  Pleasant, Other (confused, has moments of behaviors) Orientation:  Oriented to Self, Oriented to Place Alcohol / Substance use:  Not Applicable Psych involvement (Current and /or in the community):  Outpatient Provider (medications)  Discharge Needs  Concerns to be addressed:  Care Coordination Readmission within the last 30 days:  No Current discharge risk:  None Barriers to Discharge:  Continued Medical Work up, [employer]   Jeffrey Sweeney, [place] 07/08/2016, 3:00 PM

## 2016-07-08 NOTE — Clinical Social Work Placement (Signed)
   CLINICAL SOCIAL WORK PLACEMENT  NOTE  Date:  07/08/2016  Patient Details  Name: Jeffrey Sweeney MRN: [id number] Date of Birth: [birth year]  Clinical Social Work is seeking post-discharge placement for this patient at the [place] level of care (*[name] will initial, date and re-position this form in  chart as items are completed):  Yes   Patient/family provided with [facility] Work Department's list of facilities offering this level of care within the geographic area requested by the patient (or if unable, by the patient's family).  Yes   Patient/family informed of their freedom to choose among providers that offer the needed level of care, that participate in Medicare, Medicaid or managed care program needed by the patient, have an available bed and are willing to accept the patient.  Yes   Patient/family informed of [facility]'s ownership interest in [facility] and [facility], as well as of the fact that they are under no obligation to receive care at these facilities.  PASRR submitted to EDS on 07/08/16     PASRR number received on       Existing PASRR number confirmed on       FL2 transmitted to all facilities in geographic area requested by pt/family on 07/08/16     FL2 transmitted to all facilities within larger geographic area on       Patient informed that his/her managed care company has contracts with or will negotiate with certain facilities, including the following:            Patient/family informed of bed offers received.  Patient chooses bed at       Physician recommends and patient chooses bed at      Patient to be transferred to   on  .  Patient to be transferred to facility by       Patient family notified on   of transfer.  Name of family member notified:        PHYSICIAN Please sign FL2     Additional Comment:    _______________________________________________ [name], LCSW 07/08/2016, 3:06 PM

## 2016-07-09 DIAGNOSIS — I69352 Hemiplegia and hemiparesis following cerebral infarction affecting left dominant side: Secondary | ICD-10-CM | POA: Diagnosis not present

## 2016-07-09 DIAGNOSIS — Z7982 Long term (current) use of aspirin: Secondary | ICD-10-CM | POA: Diagnosis not present

## 2016-07-09 DIAGNOSIS — Z79899 Other long term (current) drug therapy: Secondary | ICD-10-CM | POA: Diagnosis not present

## 2016-07-09 DIAGNOSIS — E7800 Pure hypercholesterolemia, unspecified: Secondary | ICD-10-CM | POA: Diagnosis not present

## 2016-07-09 DIAGNOSIS — J449 Chronic obstructive pulmonary disease, unspecified: Secondary | ICD-10-CM | POA: Diagnosis not present

## 2016-07-09 DIAGNOSIS — R279 Unspecified lack of coordination: Secondary | ICD-10-CM | POA: Diagnosis not present

## 2016-07-09 DIAGNOSIS — L84 Corns and callosities: Secondary | ICD-10-CM | POA: Diagnosis not present

## 2016-07-09 DIAGNOSIS — R55 Syncope and collapse: Secondary | ICD-10-CM | POA: Diagnosis not present

## 2016-07-09 DIAGNOSIS — F319 Bipolar disorder, unspecified: Secondary | ICD-10-CM | POA: Diagnosis not present

## 2016-07-09 DIAGNOSIS — F3162 Bipolar disorder, current episode mixed, moderate: Secondary | ICD-10-CM | POA: Diagnosis not present

## 2016-07-09 DIAGNOSIS — G40901 Epilepsy, unspecified, not intractable, with status epilepticus: Secondary | ICD-10-CM | POA: Diagnosis not present

## 2016-07-09 DIAGNOSIS — R262 Difficulty in walking, not elsewhere classified: Secondary | ICD-10-CM | POA: Diagnosis not present

## 2016-07-09 DIAGNOSIS — K210 Gastro-esophageal reflux disease with esophagitis: Secondary | ICD-10-CM | POA: Diagnosis not present

## 2016-07-09 DIAGNOSIS — Z7401 Bed confinement status: Secondary | ICD-10-CM | POA: Diagnosis not present

## 2016-07-09 DIAGNOSIS — I259 Chronic ischemic heart disease, unspecified: Secondary | ICD-10-CM | POA: Diagnosis not present

## 2016-07-09 DIAGNOSIS — I951 Orthostatic hypotension: Secondary | ICD-10-CM | POA: Diagnosis not present

## 2016-07-09 DIAGNOSIS — R269 Unspecified abnormalities of gait and mobility: Secondary | ICD-10-CM | POA: Diagnosis not present

## 2016-07-09 DIAGNOSIS — B351 Tinea unguium: Secondary | ICD-10-CM | POA: Diagnosis not present

## 2016-07-09 DIAGNOSIS — M6281 Muscle weakness (generalized): Secondary | ICD-10-CM | POA: Diagnosis not present

## 2016-07-09 DIAGNOSIS — K219 Gastro-esophageal reflux disease without esophagitis: Secondary | ICD-10-CM | POA: Diagnosis not present

## 2016-07-09 DIAGNOSIS — M216X2 Other acquired deformities of left foot: Secondary | ICD-10-CM | POA: Diagnosis not present

## 2016-07-09 DIAGNOSIS — G4089 Other seizures: Secondary | ICD-10-CM | POA: Diagnosis not present

## 2016-07-09 DIAGNOSIS — I10 Essential (primary) hypertension: Secondary | ICD-10-CM | POA: Diagnosis not present

## 2016-07-09 DIAGNOSIS — R293 Abnormal posture: Secondary | ICD-10-CM | POA: Diagnosis not present

## 2016-07-09 DIAGNOSIS — I2510 Atherosclerotic heart disease of native coronary artery without angina pectoris: Secondary | ICD-10-CM | POA: Diagnosis not present

## 2016-07-09 DIAGNOSIS — F419 Anxiety disorder, unspecified: Secondary | ICD-10-CM | POA: Diagnosis not present

## 2016-07-09 DIAGNOSIS — I69354 Hemiplegia and hemiparesis following cerebral infarction affecting left non-dominant side: Secondary | ICD-10-CM | POA: Diagnosis not present

## 2016-07-09 MED ORDER — NICOTINE 21 MG/24HR TD PT24: 21.0000 mg | MEDICATED_PATCH | Freq: Every day | TRANSDERMAL | 0 refills | Status: DC

## 2016-07-09 MED ORDER — LACOSAMIDE 50 MG PO TABS: 50.0000 mg | ORAL_TABLET | Freq: Two times a day (BID) | ORAL | 5 refills | Status: DC

## 2016-07-09 MED ORDER — ALPRAZOLAM 0.5 MG PO TABS: 0.5000 mg | ORAL_TABLET | Freq: Four times a day (QID) | ORAL | 5 refills | Status: DC | PRN

## 2016-07-09 NOTE — Progress Notes (Signed)
Subjective: He says he feels about the same. He is still very confused. No new complaints. I discussed with [name] who takes care of his financial issues and generally takes care of decision making and told him that I think he needs to go to skilled care facility and he agrees. He is still very weak. PT recommended skilled care facility  Objective: Vital signs in last 24 hours: Temp:  [98.2 F (36.8 C)-98.4 F (36.9 C)] 98.4 F (36.9 C) (07/03 0500) Pulse Rate:  [55-69] 55 (07/03 0500) Resp:  [15-16] 15 (07/03 0500) BP: (123-128)/(49-64) 126/49 (07/03 0500) SpO2:  [96 %-99 %] 96 % (07/03 0500) Weight change:  Last BM Date: 07/08/16  Intake/Output from previous day: 07/02 0701 - 07/03 0700 In: 480 [P.O.:480] Out: 750 [Urine:750]  PHYSICAL EXAM General appearance: alert and mild distress Resp: rhonchi bilaterally Cardio: regular rate and rhythm, S1, S2 normal, no murmur, click, rub or gallop GI: soft, non-tender; bowel sounds normal; no masses,  no organomegaly Extremities: extremities normal, atraumatic, no cyanosis or edema Skin warm and dry. He has left hemiparesis  Lab Results:  No results found for this or any previous visit (from the past 48 hour(s)).  ABGS No results for input(s): PHART, PO2ART, TCO2, HCO3 in the last 72 hours.  Invalid input(s): PCO2 CULTURES Recent Results (from the past 240 hour(s))  Blood culture (routine x 2)     Status: None   Collection Time: 07/03/16 11:08 PM  Result Value Ref Range Status   Specimen Description BLOOD RIGHT ARM  Final   Special Requests   Final    BOTTLES DRAWN AEROBIC AND ANAEROBIC Blood Culture adequate volume   Culture NO GROWTH 5 DAYS  Final   Report Status 07/08/2016 FINAL  Final  Blood culture (routine x 2)     Status: None   Collection Time: 07/03/16 11:15 PM  Result Value Ref Range Status   Specimen Description BLOOD RIGHT ARM  Final   Special Requests   Final    BOTTLES DRAWN AEROBIC AND ANAEROBIC Blood  Culture adequate volume   Culture NO GROWTH 5 DAYS  Final   Report Status 07/08/2016 FINAL  Final  Urine Culture     Status: None   Collection Time: 07/04/16 12:08 AM  Result Value Ref Range Status   Specimen Description URINE, RANDOM  Final   Special Requests NONE  Final   Culture   Final    NO GROWTH Performed at [facility], [place] [address] [place], [place] [address]    Report Status 07/05/2016 FINAL  Final  MRSA PCR Screening     Status: Abnormal   Collection Time: 07/04/16  8:13 AM  Result Value Ref Range Status   MRSA by PCR INVALID RESULTS, SPECIMEN SENT FOR CULTURE (A) NEGATIVE Final    Comment:        The GeneXpert MRSA Assay (FDA approved for NASAL specimens only), is one component of a comprehensive MRSA colonization surveillance program. It is not intended to diagnose MRSA infection nor to guide or monitor treatment for MRSA infections.   MRSA culture     Status: None   Collection Time: 07/04/16  8:13 AM  Result Value Ref Range Status   Specimen Description NASAL SWAB  Final   Special Requests NONE  Final   Culture   Final    MRSA DETECTED Performed at [facility], [address] [place], [place] [address]    Report Status 07/06/2016 FINAL  Final   Studies/Results: No results found.  Medications:  Prior to Admission:  Prescriptions Prior to Admission  Medication Sig Dispense Refill Last Dose  . albuterol (PROVENTIL HFA) 108 (90 BASE) MCG/ACT inhaler Inhale 2 puffs into the lungs every 6 (six) hours as needed for wheezing or shortness of breath.   01/03/2016 at Unknown time  . ALPRAZolam (XANAX) 1 MG tablet Take 1 mg by mouth 4 (four) times daily as needed for anxiety.   01/03/2016 at Unknown time  . aspirin EC 325 MG tablet Take 1 tablet (325 mg total) by mouth daily. 30 tablet 3   . atorvastatin (LIPITOR) 40 MG tablet Take 40 mg by mouth at bedtime.    01/03/2016 at Unknown time  . divalproex (DEPAKOTE) 250 MG DR tablet Take 3 tablets (750  mg total) by mouth 3 (three) times daily. 180 tablet 2   . levETIRAcetam (KEPPRA) 500 MG tablet Take 3 tablets (1,500 mg total) by mouth 2 (two) times daily. 180 tablet 3   . midodrine (PROAMATINE) 5 MG tablet Take 1 tablet (5 mg total) by mouth 3 (three) times daily with meals.   01/03/2016 at Unknown time  . nitroGLYCERIN (NITROSTAT) 0.4 MG SL tablet Place 0.4 mg under the tongue every 5 (five) minutes as needed for chest pain.    unknown  . pantoprazole (PROTONIX) 40 MG tablet Take 40 mg by mouth daily.     01/03/2016 at Unknown time   Scheduled: . aspirin EC  325 mg Oral Daily  . atorvastatin  40 mg Oral QHS  . Chlorhexidine Gluconate Cloth  6 each Topical Q0600  . divalproex  750 mg Oral TID  . enoxaparin (LOVENOX) injection  40 mg Subcutaneous Q24H  . lacosamide  50 mg Oral BID  . levETIRAcetam  1,500 mg Oral BID  . midodrine  5 mg Oral TID WC  . mupirocin ointment   Nasal BID  . nicotine  21 mg Transdermal Daily  . pantoprazole  40 mg Oral Daily   Continuous:  YBW:[id number], ALPRAZolam, LORazepam  Assesment: He was admitted with seizure disorder. This is apparently from not taking his medications. He is approaching being able to be discharged but I think he is going to need to be discharged to a skilled care facility rather than directly home. He and his family understand that he will need supervision as far as his medications and I don't think he'll be able to live independently. Active Problems:   Essential hypertension   CORONARY ATHEROSCLEROSIS NATIVE CORONARY ARTERY   GERD   Bipolar 1 disorder, mixed, moderate (HCC)   COPD (chronic obstructive pulmonary disease) (HCC)   Status epilepticus (HCC)   Seizure (HCC)   Hemiparesis affecting left side as late effect of cerebrovascular accident (CVA) ([place])   Personal history of stroke with current residual effects    Plan: To skilled care facility when bed available    LOS: 4 days   Alec Mcphee L 07/09/2016, 8:06 AM

## 2016-07-09 NOTE — Progress Notes (Addendum)
Patient transferred to [place] via [facility] in stable condition.  IV d/c'd site WNL.

## 2016-07-09 NOTE — Care Management Note (Signed)
Case Management Note  Patient Details  Name: JULY NICKSON MRN: [id number] Date of Birth: 26-May-1945    If discussed at [place] Length of Stay Meetings, dates discussed:  07/09/2016  Additional Comments:  Jamal Haskin, [place]un[place], RN 07/09/2016, 10:37 AM

## 2016-07-09 NOTE — Consult Note (Signed)
   [facility] Inpatient Consult   07/09/2016  Jeffrey Sweeney 1945-01-12 [id number]   Patient screened for potential [facility] [place] Management services. Patient is on the [facility] registry as a benefit of their [employer] . Electronic medical record reveals patient's discharge plan is SNF. [facility] Care Management services not appropriate at this time. If patient's post hospital needs change please place a [facility] Care Management consult. For questions please contact:   Alexa Blish RN, [facility] Liaison  [phone]) Business Mobile [phone]) Toll free office

## 2016-07-09 NOTE — Progress Notes (Signed)
Notified [facility] that the patient would need transportation to [place].  Dispatcher verbalized understanding and stated that it would probably be after 7 pm when the patient would be transferred.  Report called to Felix Ahmadi, RN at [phone] she stated she did not have the d/c summary so it was re faxed to her.  She verbalized  Understanding and all questions were answered.

## 2016-07-09 NOTE — Discharge Summary (Signed)
Physician Discharge Summary  Patient ID: Jeffrey Sweeney MRN: [id number] DOB/AGE: 71-Oct-1947 71 y.o. Primary Care Physician:Ixchel Duck, Percell Miller, MD Admit date: 07/03/2016 Discharge date: 07/09/2016    Discharge Diagnoses:   Active Problems:   Essential hypertension   CORONARY ATHEROSCLEROSIS NATIVE CORONARY ARTERY   GERD   Bipolar 1 disorder, mixed, moderate (HCC)   COPD (chronic obstructive pulmonary disease) (HCC)   Status epilepticus (HCC)   Seizure (HCC)   Hemiparesis affecting left side as late effect of cerebrovascular accident (CVA) ([place])   Personal history of stroke with current residual effects   Allergies as of 07/09/2016      Reactions   Meperidine Hcl Nausea And Vomiting      Medication List    TAKE these medications   ALPRAZolam 0.5 MG tablet Commonly known as:  XANAX Take 1 tablet (0.5 mg total) by mouth 4 (four) times daily as needed for anxiety. What changed:  medication strength  how much to take   aspirin EC 325 MG tablet Take 1 tablet (325 mg total) by mouth daily.   atorvastatin 40 MG tablet Commonly known as:  LIPITOR Take 40 mg by mouth at bedtime.   divalproex 250 MG DR tablet Commonly known as:  DEPAKOTE Take 3 tablets (750 mg total) by mouth 3 (three) times daily.   lacosamide 50 MG Tabs tablet Commonly known as:  VIMPAT Take 1 tablet (50 mg total) by mouth 2 (two) times daily.   levETIRAcetam 500 MG tablet Commonly known as:  KEPPRA Take 3 tablets (1,500 mg total) by mouth 2 (two) times daily.   midodrine 5 MG tablet Commonly known as:  PROAMATINE Take 1 tablet (5 mg total) by mouth 3 (three) times daily with meals.   nicotine 21 mg/24hr patch Commonly known as:  NICODERM CQ - dosed in mg/24 hours Place 1 patch (21 mg total) onto the skin daily.   nitroGLYCERIN 0.4 MG SL tablet Commonly known as:  NITROSTAT Place 0.4 mg under the tongue every 5 (five) minutes as needed for chest pain.   pantoprazole 40 MG tablet Commonly known  as:  PROTONIX Take 40 mg by mouth daily.   PROVENTIL HFA 108 (90 Base) MCG/ACT inhaler Generic drug:  albuterol Inhale 2 puffs into the lungs every 6 (six) hours as needed for wheezing or shortness of breath.       Discharged Condition:Improved    Consults: Neurology  Significant Diagnostic Studies: Dg Chest 1 View  Result Date: 07/03/2016 CLINICAL DATA:  Seizure EXAM: CHEST 1 VIEW COMPARISON:  01/03/2016 FINDINGS: Borderline to mild cardiomegaly. No edema. No pleural effusion or focal pulmonary infiltrate. Aortic atherosclerosis. No pneumothorax. IMPRESSION: Borderline to mild cardiomegaly.  No edema or infiltrate Electronically Signed   By: Donavan Foil M.D.   On: 07/03/2016 23:58   Ct Head Wo Contrast  Result Date: 07/04/2016 CLINICAL DATA:  Seizure EXAM: CT HEAD WITHOUT CONTRAST TECHNIQUE: Contiguous axial images were obtained from the base of the skull through the vertex without intravenous contrast. COMPARISON:  02/10/2016 FINDINGS: Brain: No acute territorial infarction, hemorrhage or intracranial mass is seen. Extensive encephalomalacia of the right temporal, frontal and parietal lobes consistent with old infarct. Stable old infarct in the right sub insula. Moderate small vessel ischemic changes of the white matter. Marked atrophy. Stable ventricle size. Vascular: No hyperdense vessels. Carotid artery calcifications and vertebral artery calcifications. Skull: No fracture or suspicious bone lesion. Sinuses/Orbits: Mild mucosal thickening in the sinuses. Chronic deformity of the left maxillary sinus. No acute  orbital abnormality. Other: None IMPRESSION: 1. No definite CT evidence for acute intracranial abnormality 2. Old large right-sided infarct 3. Atrophy and white matter small vessel ischemic changes Electronically Signed   By: Donavan Foil M.D.   On: 07/04/2016 00:15    Lab Results: Basic Metabolic Panel: No results for input(s): NA, K, CL, CO2, GLUCOSE, BUN, CREATININE,  CALCIUM, MG, PHOS in the last 72 hours. Liver Function Tests: No results for input(s): AST, ALT, ALKPHOS, BILITOT, PROT, ALBUMIN in the last 72 hours.   CBC: No results for input(s): WBC, NEUTROABS, HGB, HCT, MCV, PLT in the last 72 hours.  Recent Results (from the past 240 hour(s))  Blood culture (routine x 2)     Status: None   Collection Time: 07/03/16 11:08 PM  Result Value Ref Range Status   Specimen Description BLOOD RIGHT ARM  Final   Special Requests   Final    BOTTLES DRAWN AEROBIC AND ANAEROBIC Blood Culture adequate volume   Culture NO GROWTH 5 DAYS  Final   Report Status 07/08/2016 FINAL  Final  Blood culture (routine x 2)     Status: None   Collection Time: 07/03/16 11:15 PM  Result Value Ref Range Status   Specimen Description BLOOD RIGHT ARM  Final   Special Requests   Final    BOTTLES DRAWN AEROBIC AND ANAEROBIC Blood Culture adequate volume   Culture NO GROWTH 5 DAYS  Final   Report Status 07/08/2016 FINAL  Final  Urine Culture     Status: None   Collection Time: 07/04/16 12:08 AM  Result Value Ref Range Status   Specimen Description URINE, RANDOM  Final   Special Requests NONE  Final   Culture   Final    NO GROWTH Performed at [facility], [place] [address], [place], [place] [address]    Report Status 07/05/2016 FINAL  Final  MRSA PCR Screening     Status: Abnormal   Collection Time: 07/04/16  8:13 AM  Result Value Ref Range Status   MRSA by PCR INVALID RESULTS, SPECIMEN SENT FOR CULTURE (A) NEGATIVE Final    Comment:        The GeneXpert MRSA Assay (FDA approved for NASAL specimens only), is one component of a comprehensive MRSA colonization surveillance program. It is not intended to diagnose MRSA infection nor to guide or monitor treatment for MRSA infections.   MRSA culture     Status: None   Collection Time: 07/04/16  8:13 AM  Result Value Ref Range Status   Specimen Description NASAL SWAB  Final   Special Requests NONE  Final    Culture   Final    MRSA DETECTED Performed at [facility], [address] [place], [place] [address]    Report Status 07/06/2016 FINAL  Final     Hospital Course: This is a 71 year old who came to the emergency room with altered mental status. He had been having seizures. He apparently has not been taking his medication for seizures. He had status epilepticus which slowly improved. Vimpat was added to his treatment plan. He was confused week and it was felt that he was going to need skilled care facility rehabilitation. This was arranged. By the time of discharge he was very weak still somewhat confused but was not having any further seizures.  Discharge Exam: Blood pressure (!) 126/49, pulse (!) 55, temperature 98.4 F (36.9 C), temperature source Oral, resp. rate 15, height 5\' 11"  (1.803 m), weight 81.3  kg (179 lb 3.7 oz), SpO2 96 %. Awake and alert confused. He has left hemiparesis from previous stroke. No seizures in the last 48 hours or so.  Disposition: To skilled care facility for rehabilitation      Signed: Kassy Mcenroe L   07/09/2016, 9:11 AM

## 2016-07-09 NOTE — Clinical Social Work Note (Signed)
LCSW received a message for Jeffrey Sweeney advising that patient was not their member and that the was a [employer] member and that his information had been forwarded to [facility].  LCSW advise Cleon Dew at [facility] being insurance providers.  Gerald Stabs advised that he had checked on the authorization and that [facility] had it and it was under review. He advised that [facility] was closed tomorrow and a LOG may be needed.  LCSW left a message for Surveyor, quantity, Nathaniel Man, regarding a LOG.        Lauran Romanski, Clydene Pugh, LCSW

## 2016-07-09 NOTE — Clinical Social Work Note (Addendum)
[employer] authorization #[id number]. Return [phone] [id number].  Gerald Stabs at [facility]; sister, Alcide Clever notified of discharge.  Transport arranged by [employer].   LCSW signing off.    Cataleia Gade, Clydene Pugh, LCSW

## 2016-07-12 DIAGNOSIS — I10 Essential (primary) hypertension: Secondary | ICD-10-CM | POA: Diagnosis not present

## 2016-07-12 DIAGNOSIS — K210 Gastro-esophageal reflux disease with esophagitis: Secondary | ICD-10-CM | POA: Diagnosis not present

## 2016-07-12 DIAGNOSIS — I2510 Atherosclerotic heart disease of native coronary artery without angina pectoris: Secondary | ICD-10-CM | POA: Diagnosis not present

## 2016-07-12 DIAGNOSIS — F319 Bipolar disorder, unspecified: Secondary | ICD-10-CM | POA: Diagnosis not present

## 2016-07-18 DIAGNOSIS — I2510 Atherosclerotic heart disease of native coronary artery without angina pectoris: Secondary | ICD-10-CM | POA: Diagnosis not present

## 2016-07-18 DIAGNOSIS — I10 Essential (primary) hypertension: Secondary | ICD-10-CM | POA: Diagnosis not present

## 2016-07-18 DIAGNOSIS — K210 Gastro-esophageal reflux disease with esophagitis: Secondary | ICD-10-CM | POA: Diagnosis not present

## 2016-07-18 DIAGNOSIS — F319 Bipolar disorder, unspecified: Secondary | ICD-10-CM | POA: Diagnosis not present

## 2016-07-22 DIAGNOSIS — F319 Bipolar disorder, unspecified: Secondary | ICD-10-CM | POA: Diagnosis not present

## 2016-07-22 DIAGNOSIS — I2510 Atherosclerotic heart disease of native coronary artery without angina pectoris: Secondary | ICD-10-CM | POA: Diagnosis not present

## 2016-07-22 DIAGNOSIS — I10 Essential (primary) hypertension: Secondary | ICD-10-CM | POA: Diagnosis not present

## 2016-07-22 DIAGNOSIS — K210 Gastro-esophageal reflux disease with esophagitis: Secondary | ICD-10-CM | POA: Diagnosis not present

## 2016-07-23 DIAGNOSIS — B351 Tinea unguium: Secondary | ICD-10-CM | POA: Diagnosis not present

## 2016-07-23 DIAGNOSIS — M216X2 Other acquired deformities of left foot: Secondary | ICD-10-CM | POA: Diagnosis not present

## 2016-07-23 DIAGNOSIS — L84 Corns and callosities: Secondary | ICD-10-CM | POA: Diagnosis not present

## 2016-08-01 DIAGNOSIS — K210 Gastro-esophageal reflux disease with esophagitis: Secondary | ICD-10-CM | POA: Diagnosis not present

## 2016-08-01 DIAGNOSIS — I10 Essential (primary) hypertension: Secondary | ICD-10-CM | POA: Diagnosis not present

## 2016-08-01 DIAGNOSIS — F319 Bipolar disorder, unspecified: Secondary | ICD-10-CM | POA: Diagnosis not present

## 2016-08-01 DIAGNOSIS — I2510 Atherosclerotic heart disease of native coronary artery without angina pectoris: Secondary | ICD-10-CM | POA: Diagnosis not present

## 2016-08-09 DIAGNOSIS — K210 Gastro-esophageal reflux disease with esophagitis: Secondary | ICD-10-CM | POA: Diagnosis not present

## 2016-08-09 DIAGNOSIS — J449 Chronic obstructive pulmonary disease, unspecified: Secondary | ICD-10-CM | POA: Diagnosis not present

## 2016-08-09 DIAGNOSIS — I10 Essential (primary) hypertension: Secondary | ICD-10-CM | POA: Diagnosis not present

## 2016-08-09 DIAGNOSIS — F319 Bipolar disorder, unspecified: Secondary | ICD-10-CM | POA: Diagnosis not present

## 2016-08-10 DIAGNOSIS — I482 Chronic atrial fibrillation: Secondary | ICD-10-CM | POA: Diagnosis not present

## 2016-08-10 DIAGNOSIS — I10 Essential (primary) hypertension: Secondary | ICD-10-CM | POA: Diagnosis not present

## 2016-08-10 DIAGNOSIS — G40909 Epilepsy, unspecified, not intractable, without status epilepticus: Secondary | ICD-10-CM | POA: Diagnosis not present

## 2016-08-10 DIAGNOSIS — R41841 Cognitive communication deficit: Secondary | ICD-10-CM | POA: Diagnosis not present

## 2016-08-10 DIAGNOSIS — J449 Chronic obstructive pulmonary disease, unspecified: Secondary | ICD-10-CM | POA: Diagnosis not present

## 2016-08-10 DIAGNOSIS — I69354 Hemiplegia and hemiparesis following cerebral infarction affecting left non-dominant side: Secondary | ICD-10-CM | POA: Diagnosis not present

## 2016-08-10 DIAGNOSIS — F17210 Nicotine dependence, cigarettes, uncomplicated: Secondary | ICD-10-CM | POA: Diagnosis not present

## 2016-08-10 DIAGNOSIS — F319 Bipolar disorder, unspecified: Secondary | ICD-10-CM | POA: Diagnosis not present

## 2016-08-10 DIAGNOSIS — I2510 Atherosclerotic heart disease of native coronary artery without angina pectoris: Secondary | ICD-10-CM | POA: Diagnosis not present

## 2016-08-11 DIAGNOSIS — R293 Abnormal posture: Secondary | ICD-10-CM | POA: Diagnosis not present

## 2016-08-11 DIAGNOSIS — I69352 Hemiplegia and hemiparesis following cerebral infarction affecting left dominant side: Secondary | ICD-10-CM | POA: Diagnosis not present

## 2016-08-11 DIAGNOSIS — I259 Chronic ischemic heart disease, unspecified: Secondary | ICD-10-CM | POA: Diagnosis not present

## 2016-08-11 DIAGNOSIS — M6281 Muscle weakness (generalized): Secondary | ICD-10-CM | POA: Diagnosis not present

## 2016-08-11 DIAGNOSIS — R55 Syncope and collapse: Secondary | ICD-10-CM | POA: Diagnosis not present

## 2016-08-11 DIAGNOSIS — I2510 Atherosclerotic heart disease of native coronary artery without angina pectoris: Secondary | ICD-10-CM | POA: Diagnosis not present

## 2016-08-11 DIAGNOSIS — R269 Unspecified abnormalities of gait and mobility: Secondary | ICD-10-CM | POA: Diagnosis not present

## 2016-08-11 DIAGNOSIS — I951 Orthostatic hypotension: Secondary | ICD-10-CM | POA: Diagnosis not present

## 2016-08-11 DIAGNOSIS — R262 Difficulty in walking, not elsewhere classified: Secondary | ICD-10-CM | POA: Diagnosis not present

## 2016-08-12 ENCOUNTER — Other Ambulatory Visit: Payer: Self-pay

## 2016-08-12 DIAGNOSIS — I10 Essential (primary) hypertension: Secondary | ICD-10-CM | POA: Diagnosis not present

## 2016-08-12 DIAGNOSIS — R41841 Cognitive communication deficit: Secondary | ICD-10-CM | POA: Diagnosis not present

## 2016-08-12 DIAGNOSIS — I482 Chronic atrial fibrillation: Secondary | ICD-10-CM | POA: Diagnosis not present

## 2016-08-12 DIAGNOSIS — J449 Chronic obstructive pulmonary disease, unspecified: Secondary | ICD-10-CM | POA: Diagnosis not present

## 2016-08-12 DIAGNOSIS — F319 Bipolar disorder, unspecified: Secondary | ICD-10-CM | POA: Diagnosis not present

## 2016-08-12 DIAGNOSIS — F17210 Nicotine dependence, cigarettes, uncomplicated: Secondary | ICD-10-CM | POA: Diagnosis not present

## 2016-08-12 DIAGNOSIS — I69354 Hemiplegia and hemiparesis following cerebral infarction affecting left non-dominant side: Secondary | ICD-10-CM | POA: Diagnosis not present

## 2016-08-12 DIAGNOSIS — G40909 Epilepsy, unspecified, not intractable, without status epilepticus: Secondary | ICD-10-CM | POA: Diagnosis not present

## 2016-08-12 DIAGNOSIS — I2510 Atherosclerotic heart disease of native coronary artery without angina pectoris: Secondary | ICD-10-CM | POA: Diagnosis not present

## 2016-08-12 NOTE — Patient Outreach (Signed)
[facility]) Care Management  08/12/2016  AASHRITH EVES 02-09-1945 [id number]     Transition of Care Referral  Referral Date: 08/12/16 Referral Source: [facility] Discharge Report Date of Admission: 07/09/16 Diagnosis: seizures disorder Date of Discharge: 08/09/16 Facility: [facility]: [facility]    Outreach attempt # 1 to patient. No answer and unable to leave voicemail message.   Plan: RN CM will make outreach to patient within one business day.   Enzo Montgomery, RN,BSN,CCM [place] Management Telephonic Care Management Coordinator Direct Phone: [phone] Toll Free: [phone] Fax: [phone]

## 2016-08-13 ENCOUNTER — Other Ambulatory Visit: Payer: Self-pay

## 2016-08-13 DIAGNOSIS — I482 Chronic atrial fibrillation: Secondary | ICD-10-CM | POA: Diagnosis not present

## 2016-08-13 DIAGNOSIS — I10 Essential (primary) hypertension: Secondary | ICD-10-CM | POA: Diagnosis not present

## 2016-08-13 DIAGNOSIS — I69354 Hemiplegia and hemiparesis following cerebral infarction affecting left non-dominant side: Secondary | ICD-10-CM | POA: Diagnosis not present

## 2016-08-13 DIAGNOSIS — I2510 Atherosclerotic heart disease of native coronary artery without angina pectoris: Secondary | ICD-10-CM | POA: Diagnosis not present

## 2016-08-13 DIAGNOSIS — F17210 Nicotine dependence, cigarettes, uncomplicated: Secondary | ICD-10-CM | POA: Diagnosis not present

## 2016-08-13 DIAGNOSIS — R41841 Cognitive communication deficit: Secondary | ICD-10-CM | POA: Diagnosis not present

## 2016-08-13 DIAGNOSIS — F319 Bipolar disorder, unspecified: Secondary | ICD-10-CM | POA: Diagnosis not present

## 2016-08-13 DIAGNOSIS — J449 Chronic obstructive pulmonary disease, unspecified: Secondary | ICD-10-CM | POA: Diagnosis not present

## 2016-08-13 DIAGNOSIS — G40909 Epilepsy, unspecified, not intractable, without status epilepticus: Secondary | ICD-10-CM | POA: Diagnosis not present

## 2016-08-13 NOTE — Patient Outreach (Signed)
[facility]) Care Management  08/13/2016  EUAN WANDLER 1945-05-24 [id number]   Transition of Care Referral  Referral Date: 08/12/16 Referral Source: [facility] Discharge Report Date of Admission: 07/09/16 Diagnosis: seizures disorder Date of Discharge: 08/09/16 Facility: [facility]: [facility]    Outreach attempt # 2 to patient. No answer and unable to leave voicemail message. No alternate number to attempt at this time.      Plan: RN CM will make outreach to patient within one business day.   Enzo Montgomery, RN,BSN,CCM [place] Management Telephonic Care Management Coordinator Direct Phone: [phone] Toll Free: [phone] Fax: [phone]

## 2016-08-14 ENCOUNTER — Other Ambulatory Visit: Payer: Self-pay

## 2016-08-14 NOTE — Patient Outreach (Signed)
[facility]) Care Management  08/14/2016  ARNEY MAYABB 03/09/1945 [id number]   Transition of Care Referral  Referral Date: 08/12/16 Referral Source: [facility] Discharge Report Date of Admission: 07/09/16 Diagnosis: seizures disorder Date of Discharge: 08/09/16 Facility: [facility]: [facility]   Outreach attempt #3 to patient. No answer and unable to leave voicemail message.    Plan: RN CM will send unsuccessful outreach letter to patient and close case if no response within 10 business days.   Enzo Montgomery, RN,BSN,CCM [place] Management Telephonic Care Management Coordinator Direct Phone: [phone] Toll Free: [phone] Fax: [phone]

## 2016-08-15 DIAGNOSIS — F319 Bipolar disorder, unspecified: Secondary | ICD-10-CM | POA: Diagnosis not present

## 2016-08-15 DIAGNOSIS — I2510 Atherosclerotic heart disease of native coronary artery without angina pectoris: Secondary | ICD-10-CM | POA: Diagnosis not present

## 2016-08-15 DIAGNOSIS — J449 Chronic obstructive pulmonary disease, unspecified: Secondary | ICD-10-CM | POA: Diagnosis not present

## 2016-08-15 DIAGNOSIS — I69354 Hemiplegia and hemiparesis following cerebral infarction affecting left non-dominant side: Secondary | ICD-10-CM | POA: Diagnosis not present

## 2016-08-15 DIAGNOSIS — I10 Essential (primary) hypertension: Secondary | ICD-10-CM | POA: Diagnosis not present

## 2016-08-15 DIAGNOSIS — R41841 Cognitive communication deficit: Secondary | ICD-10-CM | POA: Diagnosis not present

## 2016-08-15 DIAGNOSIS — G40909 Epilepsy, unspecified, not intractable, without status epilepticus: Secondary | ICD-10-CM | POA: Diagnosis not present

## 2016-08-15 DIAGNOSIS — F17210 Nicotine dependence, cigarettes, uncomplicated: Secondary | ICD-10-CM | POA: Diagnosis not present

## 2016-08-15 DIAGNOSIS — I482 Chronic atrial fibrillation: Secondary | ICD-10-CM | POA: Diagnosis not present

## 2016-08-19 DIAGNOSIS — R41841 Cognitive communication deficit: Secondary | ICD-10-CM | POA: Diagnosis not present

## 2016-08-19 DIAGNOSIS — J449 Chronic obstructive pulmonary disease, unspecified: Secondary | ICD-10-CM | POA: Diagnosis not present

## 2016-08-19 DIAGNOSIS — I2510 Atherosclerotic heart disease of native coronary artery without angina pectoris: Secondary | ICD-10-CM | POA: Diagnosis not present

## 2016-08-19 DIAGNOSIS — I10 Essential (primary) hypertension: Secondary | ICD-10-CM | POA: Diagnosis not present

## 2016-08-19 DIAGNOSIS — I482 Chronic atrial fibrillation: Secondary | ICD-10-CM | POA: Diagnosis not present

## 2016-08-19 DIAGNOSIS — G40909 Epilepsy, unspecified, not intractable, without status epilepticus: Secondary | ICD-10-CM | POA: Diagnosis not present

## 2016-08-19 DIAGNOSIS — F319 Bipolar disorder, unspecified: Secondary | ICD-10-CM | POA: Diagnosis not present

## 2016-08-19 DIAGNOSIS — I69354 Hemiplegia and hemiparesis following cerebral infarction affecting left non-dominant side: Secondary | ICD-10-CM | POA: Diagnosis not present

## 2016-08-19 DIAGNOSIS — F17210 Nicotine dependence, cigarettes, uncomplicated: Secondary | ICD-10-CM | POA: Diagnosis not present

## 2016-08-20 DIAGNOSIS — I482 Chronic atrial fibrillation: Secondary | ICD-10-CM | POA: Diagnosis not present

## 2016-08-20 DIAGNOSIS — R41841 Cognitive communication deficit: Secondary | ICD-10-CM | POA: Diagnosis not present

## 2016-08-20 DIAGNOSIS — I10 Essential (primary) hypertension: Secondary | ICD-10-CM | POA: Diagnosis not present

## 2016-08-20 DIAGNOSIS — J449 Chronic obstructive pulmonary disease, unspecified: Secondary | ICD-10-CM | POA: Diagnosis not present

## 2016-08-20 DIAGNOSIS — I69354 Hemiplegia and hemiparesis following cerebral infarction affecting left non-dominant side: Secondary | ICD-10-CM | POA: Diagnosis not present

## 2016-08-20 DIAGNOSIS — F17210 Nicotine dependence, cigarettes, uncomplicated: Secondary | ICD-10-CM | POA: Diagnosis not present

## 2016-08-20 DIAGNOSIS — I2510 Atherosclerotic heart disease of native coronary artery without angina pectoris: Secondary | ICD-10-CM | POA: Diagnosis not present

## 2016-08-20 DIAGNOSIS — F319 Bipolar disorder, unspecified: Secondary | ICD-10-CM | POA: Diagnosis not present

## 2016-08-20 DIAGNOSIS — G40909 Epilepsy, unspecified, not intractable, without status epilepticus: Secondary | ICD-10-CM | POA: Diagnosis not present

## 2016-08-22 DIAGNOSIS — I10 Essential (primary) hypertension: Secondary | ICD-10-CM | POA: Diagnosis not present

## 2016-08-22 DIAGNOSIS — F319 Bipolar disorder, unspecified: Secondary | ICD-10-CM | POA: Diagnosis not present

## 2016-08-22 DIAGNOSIS — J449 Chronic obstructive pulmonary disease, unspecified: Secondary | ICD-10-CM | POA: Diagnosis not present

## 2016-08-22 DIAGNOSIS — R41841 Cognitive communication deficit: Secondary | ICD-10-CM | POA: Diagnosis not present

## 2016-08-22 DIAGNOSIS — F17210 Nicotine dependence, cigarettes, uncomplicated: Secondary | ICD-10-CM | POA: Diagnosis not present

## 2016-08-22 DIAGNOSIS — I69354 Hemiplegia and hemiparesis following cerebral infarction affecting left non-dominant side: Secondary | ICD-10-CM | POA: Diagnosis not present

## 2016-08-22 DIAGNOSIS — I2510 Atherosclerotic heart disease of native coronary artery without angina pectoris: Secondary | ICD-10-CM | POA: Diagnosis not present

## 2016-08-22 DIAGNOSIS — G40909 Epilepsy, unspecified, not intractable, without status epilepticus: Secondary | ICD-10-CM | POA: Diagnosis not present

## 2016-08-22 DIAGNOSIS — I482 Chronic atrial fibrillation: Secondary | ICD-10-CM | POA: Diagnosis not present

## 2016-08-27 DIAGNOSIS — I10 Essential (primary) hypertension: Secondary | ICD-10-CM | POA: Diagnosis not present

## 2016-08-27 DIAGNOSIS — F319 Bipolar disorder, unspecified: Secondary | ICD-10-CM | POA: Diagnosis not present

## 2016-08-27 DIAGNOSIS — I482 Chronic atrial fibrillation: Secondary | ICD-10-CM | POA: Diagnosis not present

## 2016-08-27 DIAGNOSIS — F17210 Nicotine dependence, cigarettes, uncomplicated: Secondary | ICD-10-CM | POA: Diagnosis not present

## 2016-08-27 DIAGNOSIS — J449 Chronic obstructive pulmonary disease, unspecified: Secondary | ICD-10-CM | POA: Diagnosis not present

## 2016-08-27 DIAGNOSIS — I2510 Atherosclerotic heart disease of native coronary artery without angina pectoris: Secondary | ICD-10-CM | POA: Diagnosis not present

## 2016-08-27 DIAGNOSIS — I69354 Hemiplegia and hemiparesis following cerebral infarction affecting left non-dominant side: Secondary | ICD-10-CM | POA: Diagnosis not present

## 2016-08-27 DIAGNOSIS — R41841 Cognitive communication deficit: Secondary | ICD-10-CM | POA: Diagnosis not present

## 2016-08-27 DIAGNOSIS — G40909 Epilepsy, unspecified, not intractable, without status epilepticus: Secondary | ICD-10-CM | POA: Diagnosis not present

## 2016-08-28 ENCOUNTER — Other Ambulatory Visit: Payer: Self-pay

## 2016-08-28 DIAGNOSIS — G40909 Epilepsy, unspecified, not intractable, without status epilepticus: Secondary | ICD-10-CM | POA: Diagnosis not present

## 2016-08-28 DIAGNOSIS — I10 Essential (primary) hypertension: Secondary | ICD-10-CM | POA: Diagnosis not present

## 2016-08-28 DIAGNOSIS — F17210 Nicotine dependence, cigarettes, uncomplicated: Secondary | ICD-10-CM | POA: Diagnosis not present

## 2016-08-28 DIAGNOSIS — F319 Bipolar disorder, unspecified: Secondary | ICD-10-CM | POA: Diagnosis not present

## 2016-08-28 DIAGNOSIS — I69354 Hemiplegia and hemiparesis following cerebral infarction affecting left non-dominant side: Secondary | ICD-10-CM | POA: Diagnosis not present

## 2016-08-28 DIAGNOSIS — I482 Chronic atrial fibrillation: Secondary | ICD-10-CM | POA: Diagnosis not present

## 2016-08-28 DIAGNOSIS — J449 Chronic obstructive pulmonary disease, unspecified: Secondary | ICD-10-CM | POA: Diagnosis not present

## 2016-08-28 DIAGNOSIS — I2510 Atherosclerotic heart disease of native coronary artery without angina pectoris: Secondary | ICD-10-CM | POA: Diagnosis not present

## 2016-08-28 DIAGNOSIS — R41841 Cognitive communication deficit: Secondary | ICD-10-CM | POA: Diagnosis not present

## 2016-08-28 NOTE — Patient Outreach (Signed)
[facility]) Care Management  08/28/2016  THERON CUMBIE 1945-07-19 [id number]   Transition of Care Referral  Referral Date: 08/12/16 Referral Source: [facility] Discharge Report Date of Admission: 07/09/16 Diagnosis: seizures disorder Date of Discharge: 08/09/16 Facility: [facility]: [facility]     Multiple attempts to establish contact with patient without success. No response from letter mailed to patient. Case is being closed at this time.    Plan: RN CM will notify [facility] administrative assistant of case closure status.   Enzo Montgomery, RN,BSN,CCM [place] Management Telephonic Care Management Coordinator Direct Phone: [phone] Toll Free: [phone] Fax: [phone]

## 2016-08-29 DIAGNOSIS — G40909 Epilepsy, unspecified, not intractable, without status epilepticus: Secondary | ICD-10-CM | POA: Diagnosis not present

## 2016-08-29 DIAGNOSIS — F17210 Nicotine dependence, cigarettes, uncomplicated: Secondary | ICD-10-CM | POA: Diagnosis not present

## 2016-08-29 DIAGNOSIS — J449 Chronic obstructive pulmonary disease, unspecified: Secondary | ICD-10-CM | POA: Diagnosis not present

## 2016-08-29 DIAGNOSIS — I482 Chronic atrial fibrillation: Secondary | ICD-10-CM | POA: Diagnosis not present

## 2016-08-29 DIAGNOSIS — F319 Bipolar disorder, unspecified: Secondary | ICD-10-CM | POA: Diagnosis not present

## 2016-08-29 DIAGNOSIS — I10 Essential (primary) hypertension: Secondary | ICD-10-CM | POA: Diagnosis not present

## 2016-08-29 DIAGNOSIS — I69354 Hemiplegia and hemiparesis following cerebral infarction affecting left non-dominant side: Secondary | ICD-10-CM | POA: Diagnosis not present

## 2016-08-29 DIAGNOSIS — I2510 Atherosclerotic heart disease of native coronary artery without angina pectoris: Secondary | ICD-10-CM | POA: Diagnosis not present

## 2016-08-29 DIAGNOSIS — R41841 Cognitive communication deficit: Secondary | ICD-10-CM | POA: Diagnosis not present

## 2016-08-30 DIAGNOSIS — I69354 Hemiplegia and hemiparesis following cerebral infarction affecting left non-dominant side: Secondary | ICD-10-CM | POA: Diagnosis not present

## 2016-08-30 DIAGNOSIS — G40909 Epilepsy, unspecified, not intractable, without status epilepticus: Secondary | ICD-10-CM | POA: Diagnosis not present

## 2016-08-30 DIAGNOSIS — J449 Chronic obstructive pulmonary disease, unspecified: Secondary | ICD-10-CM | POA: Diagnosis not present

## 2016-08-30 DIAGNOSIS — I482 Chronic atrial fibrillation: Secondary | ICD-10-CM | POA: Diagnosis not present

## 2016-08-30 DIAGNOSIS — F319 Bipolar disorder, unspecified: Secondary | ICD-10-CM | POA: Diagnosis not present

## 2016-08-30 DIAGNOSIS — I2510 Atherosclerotic heart disease of native coronary artery without angina pectoris: Secondary | ICD-10-CM | POA: Diagnosis not present

## 2016-08-30 DIAGNOSIS — I10 Essential (primary) hypertension: Secondary | ICD-10-CM | POA: Diagnosis not present

## 2016-08-30 DIAGNOSIS — F17210 Nicotine dependence, cigarettes, uncomplicated: Secondary | ICD-10-CM | POA: Diagnosis not present

## 2016-08-30 DIAGNOSIS — R41841 Cognitive communication deficit: Secondary | ICD-10-CM | POA: Diagnosis not present

## 2016-09-02 DIAGNOSIS — F319 Bipolar disorder, unspecified: Secondary | ICD-10-CM | POA: Diagnosis not present

## 2016-09-02 DIAGNOSIS — I482 Chronic atrial fibrillation: Secondary | ICD-10-CM | POA: Diagnosis not present

## 2016-09-02 DIAGNOSIS — R41841 Cognitive communication deficit: Secondary | ICD-10-CM | POA: Diagnosis not present

## 2016-09-02 DIAGNOSIS — I2510 Atherosclerotic heart disease of native coronary artery without angina pectoris: Secondary | ICD-10-CM | POA: Diagnosis not present

## 2016-09-02 DIAGNOSIS — F17210 Nicotine dependence, cigarettes, uncomplicated: Secondary | ICD-10-CM | POA: Diagnosis not present

## 2016-09-02 DIAGNOSIS — G40909 Epilepsy, unspecified, not intractable, without status epilepticus: Secondary | ICD-10-CM | POA: Diagnosis not present

## 2016-09-02 DIAGNOSIS — J449 Chronic obstructive pulmonary disease, unspecified: Secondary | ICD-10-CM | POA: Diagnosis not present

## 2016-09-02 DIAGNOSIS — I69354 Hemiplegia and hemiparesis following cerebral infarction affecting left non-dominant side: Secondary | ICD-10-CM | POA: Diagnosis not present

## 2016-09-02 DIAGNOSIS — I10 Essential (primary) hypertension: Secondary | ICD-10-CM | POA: Diagnosis not present

## 2016-09-04 DIAGNOSIS — I2510 Atherosclerotic heart disease of native coronary artery without angina pectoris: Secondary | ICD-10-CM | POA: Diagnosis not present

## 2016-09-04 DIAGNOSIS — F17210 Nicotine dependence, cigarettes, uncomplicated: Secondary | ICD-10-CM | POA: Diagnosis not present

## 2016-09-04 DIAGNOSIS — J449 Chronic obstructive pulmonary disease, unspecified: Secondary | ICD-10-CM | POA: Diagnosis not present

## 2016-09-04 DIAGNOSIS — G40909 Epilepsy, unspecified, not intractable, without status epilepticus: Secondary | ICD-10-CM | POA: Diagnosis not present

## 2016-09-04 DIAGNOSIS — I69354 Hemiplegia and hemiparesis following cerebral infarction affecting left non-dominant side: Secondary | ICD-10-CM | POA: Diagnosis not present

## 2016-09-04 DIAGNOSIS — R41841 Cognitive communication deficit: Secondary | ICD-10-CM | POA: Diagnosis not present

## 2016-09-04 DIAGNOSIS — I482 Chronic atrial fibrillation: Secondary | ICD-10-CM | POA: Diagnosis not present

## 2016-09-04 DIAGNOSIS — I10 Essential (primary) hypertension: Secondary | ICD-10-CM | POA: Diagnosis not present

## 2016-09-04 DIAGNOSIS — F319 Bipolar disorder, unspecified: Secondary | ICD-10-CM | POA: Diagnosis not present

## 2016-09-06 DIAGNOSIS — J449 Chronic obstructive pulmonary disease, unspecified: Secondary | ICD-10-CM | POA: Diagnosis not present

## 2016-09-06 DIAGNOSIS — F319 Bipolar disorder, unspecified: Secondary | ICD-10-CM | POA: Diagnosis not present

## 2016-09-06 DIAGNOSIS — I69354 Hemiplegia and hemiparesis following cerebral infarction affecting left non-dominant side: Secondary | ICD-10-CM | POA: Diagnosis not present

## 2016-09-06 DIAGNOSIS — I482 Chronic atrial fibrillation: Secondary | ICD-10-CM | POA: Diagnosis not present

## 2016-09-06 DIAGNOSIS — G40909 Epilepsy, unspecified, not intractable, without status epilepticus: Secondary | ICD-10-CM | POA: Diagnosis not present

## 2016-09-06 DIAGNOSIS — F17210 Nicotine dependence, cigarettes, uncomplicated: Secondary | ICD-10-CM | POA: Diagnosis not present

## 2016-09-06 DIAGNOSIS — I10 Essential (primary) hypertension: Secondary | ICD-10-CM | POA: Diagnosis not present

## 2016-09-06 DIAGNOSIS — I2510 Atherosclerotic heart disease of native coronary artery without angina pectoris: Secondary | ICD-10-CM | POA: Diagnosis not present

## 2016-09-06 DIAGNOSIS — R41841 Cognitive communication deficit: Secondary | ICD-10-CM | POA: Diagnosis not present

## 2016-09-07 DIAGNOSIS — I482 Chronic atrial fibrillation: Secondary | ICD-10-CM | POA: Diagnosis not present

## 2016-09-07 DIAGNOSIS — R41841 Cognitive communication deficit: Secondary | ICD-10-CM | POA: Diagnosis not present

## 2016-09-07 DIAGNOSIS — I2510 Atherosclerotic heart disease of native coronary artery without angina pectoris: Secondary | ICD-10-CM | POA: Diagnosis not present

## 2016-09-07 DIAGNOSIS — I10 Essential (primary) hypertension: Secondary | ICD-10-CM | POA: Diagnosis not present

## 2016-09-07 DIAGNOSIS — I69354 Hemiplegia and hemiparesis following cerebral infarction affecting left non-dominant side: Secondary | ICD-10-CM | POA: Diagnosis not present

## 2016-09-07 DIAGNOSIS — G40909 Epilepsy, unspecified, not intractable, without status epilepticus: Secondary | ICD-10-CM | POA: Diagnosis not present

## 2016-09-07 DIAGNOSIS — F17210 Nicotine dependence, cigarettes, uncomplicated: Secondary | ICD-10-CM | POA: Diagnosis not present

## 2016-09-07 DIAGNOSIS — F319 Bipolar disorder, unspecified: Secondary | ICD-10-CM | POA: Diagnosis not present

## 2016-09-07 DIAGNOSIS — J449 Chronic obstructive pulmonary disease, unspecified: Secondary | ICD-10-CM | POA: Diagnosis not present

## 2016-09-11 DIAGNOSIS — R41841 Cognitive communication deficit: Secondary | ICD-10-CM | POA: Diagnosis not present

## 2016-09-11 DIAGNOSIS — F17210 Nicotine dependence, cigarettes, uncomplicated: Secondary | ICD-10-CM | POA: Diagnosis not present

## 2016-09-11 DIAGNOSIS — I10 Essential (primary) hypertension: Secondary | ICD-10-CM | POA: Diagnosis not present

## 2016-09-11 DIAGNOSIS — I2510 Atherosclerotic heart disease of native coronary artery without angina pectoris: Secondary | ICD-10-CM | POA: Diagnosis not present

## 2016-09-11 DIAGNOSIS — I482 Chronic atrial fibrillation: Secondary | ICD-10-CM | POA: Diagnosis not present

## 2016-09-11 DIAGNOSIS — F319 Bipolar disorder, unspecified: Secondary | ICD-10-CM | POA: Diagnosis not present

## 2016-09-11 DIAGNOSIS — I69354 Hemiplegia and hemiparesis following cerebral infarction affecting left non-dominant side: Secondary | ICD-10-CM | POA: Diagnosis not present

## 2016-09-11 DIAGNOSIS — J449 Chronic obstructive pulmonary disease, unspecified: Secondary | ICD-10-CM | POA: Diagnosis not present

## 2016-09-11 DIAGNOSIS — G40909 Epilepsy, unspecified, not intractable, without status epilepticus: Secondary | ICD-10-CM | POA: Diagnosis not present

## 2016-11-13 ENCOUNTER — Encounter (INDEPENDENT_AMBULATORY_CARE_PROVIDER_SITE_OTHER): Payer: Self-pay | Admitting: *Deleted

## 2017-04-07 DIAGNOSIS — J449 Chronic obstructive pulmonary disease, unspecified: Secondary | ICD-10-CM | POA: Diagnosis not present

## 2017-04-07 DIAGNOSIS — R569 Unspecified convulsions: Secondary | ICD-10-CM | POA: Diagnosis not present

## 2017-04-07 DIAGNOSIS — I10 Essential (primary) hypertension: Secondary | ICD-10-CM | POA: Diagnosis not present

## 2017-04-07 DIAGNOSIS — I951 Orthostatic hypotension: Secondary | ICD-10-CM | POA: Diagnosis not present

## 2017-05-23 ENCOUNTER — Encounter (HOSPITAL_COMMUNITY): Payer: Self-pay | Admitting: Emergency Medicine

## 2017-05-23 ENCOUNTER — Observation Stay (HOSPITAL_COMMUNITY)
Admission: EM | Admit: 2017-05-23 | Discharge: 2017-05-28 | Disposition: A | Discharge status: Home / Self Care | Payer: Medicare HMO | Attending: Pulmonary Disease | Admitting: Pulmonary Disease

## 2017-05-23 ENCOUNTER — Emergency Department (HOSPITAL_COMMUNITY): Payer: Medicare HMO

## 2017-05-23 ENCOUNTER — Other Ambulatory Visit: Payer: Self-pay

## 2017-05-23 DIAGNOSIS — R531 Weakness: Secondary | ICD-10-CM | POA: Diagnosis not present

## 2017-05-23 DIAGNOSIS — I2510 Atherosclerotic heart disease of native coronary artery without angina pectoris: Secondary | ICD-10-CM | POA: Diagnosis not present

## 2017-05-23 DIAGNOSIS — Z7982 Long term (current) use of aspirin: Secondary | ICD-10-CM | POA: Diagnosis not present

## 2017-05-23 DIAGNOSIS — G40909 Epilepsy, unspecified, not intractable, without status epilepticus: Secondary | ICD-10-CM

## 2017-05-23 DIAGNOSIS — M549 Dorsalgia, unspecified: Secondary | ICD-10-CM | POA: Diagnosis present

## 2017-05-23 DIAGNOSIS — W19XXXA Unspecified fall, initial encounter: Secondary | ICD-10-CM | POA: Diagnosis not present

## 2017-05-23 DIAGNOSIS — J449 Chronic obstructive pulmonary disease, unspecified: Secondary | ICD-10-CM | POA: Diagnosis not present

## 2017-05-23 DIAGNOSIS — S79912A Unspecified injury of left hip, initial encounter: Secondary | ICD-10-CM | POA: Diagnosis not present

## 2017-05-23 DIAGNOSIS — M25511 Pain in right shoulder: Secondary | ICD-10-CM | POA: Diagnosis not present

## 2017-05-23 DIAGNOSIS — M545 Low back pain, unspecified: Secondary | ICD-10-CM

## 2017-05-23 DIAGNOSIS — S299XXA Unspecified injury of thorax, initial encounter: Secondary | ICD-10-CM | POA: Diagnosis not present

## 2017-05-23 DIAGNOSIS — R0902 Hypoxemia: Principal | ICD-10-CM | POA: Insufficient documentation

## 2017-05-23 DIAGNOSIS — Y998 Other external cause status: Secondary | ICD-10-CM | POA: Diagnosis not present

## 2017-05-23 DIAGNOSIS — Z79899 Other long term (current) drug therapy: Secondary | ICD-10-CM | POA: Diagnosis not present

## 2017-05-23 DIAGNOSIS — Y92019 Unspecified place in single-family (private) house as the place of occurrence of the external cause: Secondary | ICD-10-CM | POA: Diagnosis not present

## 2017-05-23 DIAGNOSIS — F3162 Bipolar disorder, current episode mixed, moderate: Secondary | ICD-10-CM | POA: Diagnosis present

## 2017-05-23 DIAGNOSIS — Z8673 Personal history of transient ischemic attack (TIA), and cerebral infarction without residual deficits: Secondary | ICD-10-CM | POA: Insufficient documentation

## 2017-05-23 DIAGNOSIS — I4891 Unspecified atrial fibrillation: Secondary | ICD-10-CM | POA: Diagnosis not present

## 2017-05-23 DIAGNOSIS — R2681 Unsteadiness on feet: Secondary | ICD-10-CM | POA: Diagnosis not present

## 2017-05-23 DIAGNOSIS — M546 Pain in thoracic spine: Secondary | ICD-10-CM | POA: Diagnosis not present

## 2017-05-23 DIAGNOSIS — S79911A Unspecified injury of right hip, initial encounter: Secondary | ICD-10-CM | POA: Diagnosis not present

## 2017-05-23 DIAGNOSIS — I69354 Hemiplegia and hemiparesis following cerebral infarction affecting left non-dominant side: Secondary | ICD-10-CM

## 2017-05-23 DIAGNOSIS — F17210 Nicotine dependence, cigarettes, uncomplicated: Secondary | ICD-10-CM | POA: Diagnosis not present

## 2017-05-23 DIAGNOSIS — R7981 Abnormal blood-gas level: Secondary | ICD-10-CM

## 2017-05-23 DIAGNOSIS — I10 Essential (primary) hypertension: Secondary | ICD-10-CM | POA: Diagnosis not present

## 2017-05-23 DIAGNOSIS — R0602 Shortness of breath: Secondary | ICD-10-CM | POA: Diagnosis not present

## 2017-05-23 DIAGNOSIS — E785 Hyperlipidemia, unspecified: Secondary | ICD-10-CM | POA: Diagnosis not present

## 2017-05-23 DIAGNOSIS — S32000A Wedge compression fracture of unspecified lumbar vertebra, initial encounter for closed fracture: Secondary | ICD-10-CM

## 2017-05-23 DIAGNOSIS — Y9301 Activity, walking, marching and hiking: Secondary | ICD-10-CM | POA: Diagnosis not present

## 2017-05-23 LAB — CBC WITH DIFFERENTIAL/PLATELET
BASOS ABS: 0 10*3/uL (ref 0.0–0.1)
BASOS PCT: 0 %
Eosinophils Absolute: 0.2 10*3/uL (ref 0.0–0.7)
Eosinophils Relative: 2 %
HCT: 49.3 % (ref 39.0–52.0)
HEMOGLOBIN: 15.6 g/dL (ref 13.0–17.0)
LYMPHS PCT: 33 %
Lymphs Abs: 2.7 10*3/uL (ref 0.7–4.0)
MCH: 31.6 pg (ref 26.0–34.0)
MCHC: 31.6 g/dL (ref 30.0–36.0)
MCV: 99.8 fL (ref 78.0–100.0)
MONO ABS: 0.5 10*3/uL (ref 0.1–1.0)
MONOS PCT: 6 %
Neutro Abs: 4.8 10*3/uL (ref 1.7–7.7)
Neutrophils Relative %: 59 %
Platelets: 210 10*3/uL (ref 150–400)
RBC: 4.94 MIL/uL (ref 4.22–5.81)
RDW: 13.6 % (ref 11.5–15.5)
WBC: 8.1 10*3/uL (ref 4.0–10.5)

## 2017-05-23 LAB — COMPREHENSIVE METABOLIC PANEL
ALT: 8 U/L — ABNORMAL LOW (ref 17–63)
AST: 10 U/L — ABNORMAL LOW (ref 15–41)
Albumin: 3.5 g/dL (ref 3.5–5.0)
Alkaline Phosphatase: 66 U/L (ref 38–126)
Anion gap: 8 (ref 5–15)
BUN: 10 mg/dL (ref 6–20)
CHLORIDE: 99 mmol/L — AB (ref 101–111)
CO2: 30 mmol/L (ref 22–32)
Calcium: 8.9 mg/dL (ref 8.9–10.3)
Creatinine, Ser: 0.87 mg/dL (ref 0.61–1.24)
GFR calc non Af Amer: >60 mL/min (ref >60)
Glucose, Bld: 96 mg/dL (ref 65–99)
POTASSIUM: 3.9 mmol/L (ref 3.5–5.1)
Sodium: 137 mmol/L (ref 135–145)
TOTAL PROTEIN: 7.1 g/dL (ref 6.5–8.1)
Total Bilirubin: 0.8 mg/dL (ref 0.3–1.2)

## 2017-05-23 LAB — CK: Total CK: 43 U/L — ABNORMAL LOW (ref 49–397)

## 2017-05-23 LAB — MAGNESIUM: MAGNESIUM: 2.1 mg/dL (ref 1.7–2.4)

## 2017-05-23 LAB — PHOSPHORUS: PHOSPHORUS: 3.4 mg/dL (ref 2.5–4.6)

## 2017-05-23 MED ORDER — ACETAMINOPHEN 325 MG PO TABS
650.0000 mg | ORAL_TABLET | Freq: Four times a day (QID) | ORAL | Status: DC | PRN
  Administered 2017-05-28: 650 mg via ORAL
  Filled 2017-05-23: qty 2

## 2017-05-23 MED ORDER — ENOXAPARIN SODIUM 40 MG/0.4ML ~~LOC~~ SOLN
40.0000 mg | SUBCUTANEOUS | Status: DC
  Administered 2017-05-24 – 2017-05-28 (×5): 40 mg via SUBCUTANEOUS
  Filled 2017-05-23 (×5): qty 0.4

## 2017-05-23 MED ORDER — ONDANSETRON HCL 4 MG PO TABS: 4.0000 mg | ORAL_TABLET | Freq: Four times a day (QID) | ORAL | Status: DC | PRN

## 2017-05-23 MED ORDER — ASPIRIN EC 81 MG PO TBEC
81.0000 mg | DELAYED_RELEASE_TABLET | Freq: Every day | ORAL | Status: DC
  Administered 2017-05-24 – 2017-05-28 (×5): 81 mg via ORAL
  Filled 2017-05-23 (×5): qty 1

## 2017-05-23 MED ORDER — ALPRAZOLAM 0.5 MG PO TABS
0.5000 mg | ORAL_TABLET | Freq: Four times a day (QID) | ORAL | Status: DC | PRN
  Administered 2017-05-25 – 2017-05-28 (×5): 0.5 mg via ORAL
  Filled 2017-05-23 (×5): qty 1

## 2017-05-23 MED ORDER — HYDROCODONE-ACETAMINOPHEN 5-325 MG PO TABS
1.0000 | ORAL_TABLET | ORAL | Status: DC | PRN
  Administered 2017-05-24 – 2017-05-26 (×4): 2 via ORAL
  Administered 2017-05-27: 1 via ORAL
  Administered 2017-05-27 (×2): 2 via ORAL
  Filled 2017-05-23 (×4): qty 2
  Filled 2017-05-23: qty 1
  Filled 2017-05-23 (×2): qty 2

## 2017-05-23 MED ORDER — MIDODRINE HCL 5 MG PO TABS
5.0000 mg | ORAL_TABLET | Freq: Three times a day (TID) | ORAL | Status: DC
  Administered 2017-05-24 – 2017-05-28 (×14): 5 mg via ORAL
  Filled 2017-05-23 (×14): qty 1

## 2017-05-23 MED ORDER — DIVALPROEX SODIUM 250 MG PO DR TAB
750.0000 mg | DELAYED_RELEASE_TABLET | Freq: Every day | ORAL | Status: DC
  Administered 2017-05-23 – 2017-05-27 (×5): 750 mg via ORAL
  Filled 2017-05-23 (×5): qty 3

## 2017-05-23 MED ORDER — IBUPROFEN 600 MG PO TABS
600.0000 mg | ORAL_TABLET | Freq: Four times a day (QID) | ORAL | Status: DC
  Administered 2017-05-23 – 2017-05-28 (×16): 600 mg via ORAL
  Filled 2017-05-23 (×16): qty 1

## 2017-05-23 MED ORDER — PANTOPRAZOLE SODIUM 40 MG PO TBEC
40.0000 mg | DELAYED_RELEASE_TABLET | Freq: Every day | ORAL | Status: DC
  Administered 2017-05-24 – 2017-05-28 (×5): 40 mg via ORAL
  Filled 2017-05-23 (×5): qty 1

## 2017-05-23 MED ORDER — ONDANSETRON HCL 4 MG/2ML IJ SOLN
4.0000 mg | Freq: Four times a day (QID) | INTRAMUSCULAR | Status: DC | PRN
  Administered 2017-05-25: 4 mg via INTRAVENOUS
  Filled 2017-05-23: qty 2

## 2017-05-23 MED ORDER — ALBUTEROL SULFATE (2.5 MG/3ML) 0.083% IN NEBU: 3.0000 mL | INHALATION_SOLUTION | Freq: Four times a day (QID) | RESPIRATORY_TRACT | Status: DC | PRN

## 2017-05-23 MED ORDER — ACETAMINOPHEN 650 MG RE SUPP: 650.0000 mg | Freq: Four times a day (QID) | RECTAL | Status: DC | PRN

## 2017-05-23 NOTE — ED Notes (Signed)
Placed pt on 2L nasal cannula. Sats increased to 94%.

## 2017-05-23 NOTE — ED Triage Notes (Signed)
Per EMS, pt from home had an unwitnessed fall due to tripping over his feet yesterday. Pt states he was on the ground for a couple of hours. When his son found him he helped him into the bed. Pt c/o back pain that worsens with movement. Pt has LT sided deficits from prior stroke. Pt AOx4. Sats 88% on RA. Pt does not wear oxygen at home. HX of COPD.

## 2017-05-23 NOTE — ED Notes (Signed)
Phlebotomy at bedside.

## 2017-05-23 NOTE — ED Notes (Signed)
Pt returned from xray

## 2017-05-23 NOTE — ED Provider Notes (Signed)
[facility] EMERGENCY DEPARTMENT Provider Note   CSN: [id number] Arrival date & time: 05/23/17  1414     History   Chief Complaint Chief Complaint  Patient presents with  . Fall    HPI TRAJON ROSETE is a 72 y.o. male.  Patient brought in by EMS.  Patient usually has a family member takes care of him at home but the family members apparently getting surgery.  Patient with this fall yesterday.  Tripping over his feet apparently does use walker at home has had a previous stroke with weakness to his left hand and left leg.  Patient not able to take care of himself at home by himself.  Patient was on the floor for period time complained of back pain that worsens with movement.  Patient has a history of COPD.  Not on home oxygen.  Arrived here hypoxic with sats around 88% room air.  The time I saw patient he was on 2 L of oxygen sats were up around 94%.     Past Medical History:  Diagnosis Date  . AF (atrial fibrillation) ([place])   . Bipolar disorder ([place])   . CAD (coronary artery disease)   . COPD (chronic obstructive pulmonary disease) ([place])   . Depression   . Headache   . Heart disease   . Hypertension   . Seizures ([place])   . Stroke [facility])    left arm weakness    Patient Active Problem List   Diagnosis Date Noted  . Hemiparesis affecting left side as late effect of cerebrovascular accident (CVA) ([place]) 07/08/2016  . Personal history of stroke with current residual effects 07/08/2016  . Seizure ([place]) 07/04/2016  . Status epilepticus ([place]) 01/09/2016  . HCAP (healthcare-associated pneumonia) 01/11/2015  . Fever 01/10/2015  . Osteoporosis 01/07/2015  . Compression fracture of lumbar spine, non-traumatic ([place]) 01/07/2015  . COPD exacerbation ([place]) 01/05/2015  . Falls 01/05/2015  . Hypokalemia 08/14/2011  . DVT (deep venous thrombosis) ([place]) 08/14/2011  . Atrial fibrillation ([place]) 08/13/2011  . Rib fractures 08/07/2011  . Respiratory distress 08/07/2011  . COPD (chronic  obstructive pulmonary disease) ([place]) 08/07/2011  . Atelectasis 08/07/2011  . H/O chest tube placement 08/07/2011  . Hemothorax 08/07/2011  . Hyperlipidemia 07/17/2010  . ANXIETY DEPRESSION 07/17/2010  . Essential hypertension 07/17/2010  . Coronary atherosclerosis 07/17/2010  . CORONARY ATHEROSCLEROSIS NATIVE CORONARY ARTERY 07/17/2010  . Stroke ([place]) 07/17/2010  . CEREBROVASCULAR DISEASE 07/17/2010  . GERD 07/17/2010  . Seizure disorder ([place]) 07/17/2010  . SPLENIC INFARCTION 07/05/2009  . EMBOLISM&THROMBOSIS OF OTHER SPECIFIED ARTERY 07/05/2009  . EARLY SATIETY 07/05/2009  . WEIGHT LOSS, ABNORMAL 07/05/2009  . DIARRHEA 07/05/2009  . LUQ PAIN 07/05/2009  . PERSONAL HISTORY OF COLONIC POLYPS 07/05/2009  . Orthostatic hypotension 05/01/2009  . SYNCOPE 05/01/2009  . CHEST PAIN 05/01/2009  . LOW BACK PAIN, MILD 04/20/2009  . Bipolar 1 disorder, mixed, moderate ([place]) 04/20/2009    Past Surgical History:  Procedure Laterality Date  . ANKLE FRACTURE SURGERY          Home Medications    Prior to Admission medications   Medication Sig Start Date End Date Taking? Authorizing Provider  albuterol (PROVENTIL HFA) 108 (90 BASE) MCG/ACT inhaler Inhale 2 puffs into the lungs every 6 (six) hours as needed for wheezing or shortness of breath.   Yes [provider]  ALPRAZolam (XANAX) 0.5 MG tablet Take 1 tablet (0.5 mg total) by mouth 4 (four) times daily as needed for anxiety. Patient taking  differently: Take 1 mg by mouth 4 (four) times daily as needed for anxiety.  07/09/16  Yes Sinda Du, MD  aspirin EC 81 MG tablet Take 81 mg by mouth daily.   Yes [provider]  divalproex (DEPAKOTE) 250 MG DR tablet Take 3 tablets (750 mg total) by mouth 3 (three) times daily. Patient taking differently: Take 750 mg by mouth at bedtime.  02/10/16  Yes Forde Dandy, MD  midodrine (PROAMATINE) 5 MG tablet Take 1 tablet (5 mg total) by mouth 3 (three) times daily with meals.  01/07/15  Yes Sinda Du, MD  pantoprazole (PROTONIX) 40 MG tablet Take 40 mg by mouth daily.   Yes [provider]  levETIRAcetam (KEPPRA) 500 MG tablet Take 3 tablets (1,500 mg total) by mouth 2 (two) times daily. Patient not taking: Reported on 05/23/2017 02/10/16   Forde Dandy, MD  nitroGLYCERIN (NITROSTAT) 0.4 MG SL tablet Place 0.4 mg under the tongue every 5 (five) minutes as needed for chest pain.     [provider]    Family History Family History  Problem Relation Age of Onset  . Cancer Mother   . Cancer Father     Social History Social History   Tobacco Use  . Smoking status: Current Every Day Smoker    Packs/day: 0.50    Years: 50.00    Pack years: 25.00    Types: Cigarettes  . Smokeless tobacco: Never Used  Substance Use Topics  . Alcohol use: No  . Drug use: No     Allergies   Meperidine hcl   Review of Systems Review of Systems  Constitutional: Negative for fever.  HENT: Negative for congestion.   Eyes: Negative for visual disturbance.  Respiratory: Positive for shortness of breath.   Gastrointestinal: Negative for abdominal pain.  Genitourinary: Negative for dysuria.  Musculoskeletal: Positive for back pain. Negative for neck pain.  Skin: Negative for rash.  Neurological: Positive for weakness. Negative for headaches.  Hematological: Does not bruise/bleed easily.  Psychiatric/Behavioral: Negative for confusion.     Physical Exam Updated Vital Signs BP 120/67   Pulse 91   Temp 98.5 F (36.9 C) (Oral)   Resp 17   Ht 1.727 m (5\' 8" )   Wt 81.6 kg (180 lb)   SpO2 97%   BMI 27.37 kg/m   Physical Exam  Constitutional: He is oriented to person, place, and time. He appears well-developed and well-nourished. No distress.  HENT:  Head: Normocephalic and atraumatic.  Mouth/Throat: Oropharynx is clear and moist.  Eyes: Conjunctivae and EOM are normal.  Neck: Normal range of motion. Neck supple.  No posterior neck  tenderness to palpation.  Good range of motion.  Cardiovascular: Normal rate, regular rhythm and normal heart sounds.  Pulmonary/Chest: Effort normal and breath sounds normal.  Abdominal: Soft. Bowel sounds are normal. There is no tenderness.  Musculoskeletal: Normal range of motion. He exhibits no deformity.  Some mild tenderness to palpation along the lumbar spine area.  No bruising no abrasion no swelling.  Neurological: He is alert and oriented to person, place, and time. No cranial nerve deficit.  Pre-existing left hand weakness left leg weakness.  Skin: Skin is warm.  Nursing note and vitals reviewed.    ED Treatments / Results  Labs (all labs ordered are listed, but only abnormal results are displayed) Labs Reviewed  COMPREHENSIVE METABOLIC PANEL - Abnormal; Notable for the following components:      Result Value   Chloride 99 (*)  AST 10 (*)    ALT 8 (*)    All other components within normal limits  CK - Abnormal; Notable for the following components:   Total CK 43 (*)    All other components within normal limits  CBC WITH DIFFERENTIAL/PLATELET    EKG EKG Interpretation  Date/Time:  Friday May 23 2017 16:10:52 EDT Ventricular Rate:  74 PR Interval:    QRS Duration: 98 QT Interval:  403 QTC Calculation: 448 R Axis:   97 Text Interpretation:  Sinus rhythm with marked sinus arrhythmia Right axis deviation Borderline T wave abnormalities Baseline wander in lead(s) V5 Since last tracing rate slower Confirmed by Daleen Bo [phone]) on 05/23/2017 4:29:43 PM   Radiology Dg Chest 1 View  Result Date: 05/23/2017 CLINICAL DATA:  Decreased O2 sats, status post fall yesterday, found down. Low back pain and bilateral hip pain. EXAM: CHEST  1 VIEW COMPARISON:  Chest x-rays dated 07/03/2016 and 01/03/2016. FINDINGS: Stable cardiomegaly. Overall cardiomediastinal silhouette appears stable in size and configuration. Lungs are clear. No pleural effusion or pneumothorax seen.  Osseous structures about the chest are unremarkable. IMPRESSION: No active disease.  Stable cardiomegaly. Electronically Signed   By: Franki Cabot M.D.   On: 05/23/2017 17:27   Dg Lumbar Spine Complete  Result Date: 05/23/2017 CLINICAL DATA:  Decreased O2 sats, Lower back pain, bilateral hip pain. Pt tripped and fell yesterday, laid in the floor for a couple hours until found. Pt stated chronic lower back pain with known bulging disc, left sided deficits from prior stroke. EXAM: LUMBAR SPINE - COMPLETE 4+ VIEW COMPARISON:  Plain film of the lumbar spine dated 01/05/2015. FINDINGS: Stable scoliosis. No evidence of acute vertebral body subluxation. No acute appearing fracture line or displaced fracture fragment. Chronic appearing compression fracture deformities are seen at each level of the lumbar spine, all progressed compared to the previous plain film examination, most significant compression deformity at the L2 vertebral body level where there is now approximately 75% compression anteriorly. Atherosclerotic calcifications seen along the walls of the infrarenal abdominal aorta. Visualized paravertebral soft tissues are otherwise unremarkable. IMPRESSION: 1. No acute appearing osseous fracture or dislocation seen. Stable scoliosis. 2. Compression fracture deformities at each level of the lumbar spine, each of indeterminate age but each most likely chronic based on appearance. I cannot, however, exclude acute on chronic compression fracture at any level given the history of back pain and recent trauma. Consider more definitive characterization with lumbar spine MRI if clinically needed. 3. Aortic atherosclerosis. Electronically Signed   By: Franki Cabot M.D.   On: 05/23/2017 17:34   Dg Hips Bilat W Or Wo Pelvis 3-4 Views  Result Date: 05/23/2017 CLINICAL DATA:  Decreased O2 sats, Lower back pain, bilateral hip pain. Pt tripped and fell yesterday, laid in the floor for a couple hours until found. Pt stated  chronic lower back pain with known bulging disc, No prior history of hip injury. EXAM: DG HIP (WITH OR WITHOUT PELVIS) 3-4V BILAT COMPARISON:  None. FINDINGS: Single view of the pelvis and two views of each hip are provided. Osseous alignment is normal. No fracture line or displaced fracture fragment seen. No significant degenerative change at either hip joint. Soft tissues about the pelvis and hips are unremarkable. IMPRESSION: Negative. Electronically Signed   By: Franki Cabot M.D.   On: 05/23/2017 17:29    Procedures Procedures (including critical care time)  Medications Ordered in ED Medications - No data to display   Initial Impression /  Assessment and Plan / ED Course  I have reviewed the triage vital signs and the nursing notes.  Pertinent labs & imaging results that were available during my care of the patient were reviewed by me and considered in my medical decision making (see chart for details).    Patient's work-up related to the fall without any acute findings.  No significant lab abnormalities.  Chest x-ray clear x-ray of pelvis and both hips without any bony abnormality.  Lumbar x-rays suggestive of age undetermined multiple compression fractures.  Cannot completely rule out any acute fracture.  Patient certainly has increased pain subjectively and does have some tenderness in that area.  So could have an acute compression fracture.  However patient is not on home oxygen no wheezing here but on room air sats at 88% without exertion.  Patient not stable on his feet patient not able to take care of himself at home.  His current caregiver is undergoing surgery.  Patient's primary care doctor is Dr. Luan Pulling.  Discussed with the hospitalist they will admit for the hypoxia pain control patient may require short-term placement to nursing facility.  Does appear patient will require arrangement for home oxygen.   Final Clinical Impressions(s) / ED Diagnoses   Final diagnoses:  Hypoxia    Fall, initial encounter  Acute midline low back pain without sciatica    ED Discharge Orders    None       Fredia Sorrow, MD 05/23/17 2347

## 2017-05-23 NOTE — ED Notes (Signed)
EDP at bedside  

## 2017-05-23 NOTE — ED Notes (Signed)
Pt nephew- Maddon Horton [phone]

## 2017-05-23 NOTE — H&P (Signed)
History and Physical  Jeffrey Sweeney ZOX:[id number] DOB: [birth year] DOA: 05/23/2017  Referring physician: Dr Rogene Houston, ED physician PCP: Sinda Du, MD  Outpatient Specialists: none  Patient Coming From: Home  Chief Complaint: Fall, back pain  HPI: Jeffrey Sweeney is a 72 y.o. male with a history of COPD, coronary artery disease, hypertension, seizure disorder, history of stroke with residual left arm weakness, history of atrial fibrillation after syncope and rib fracture in 2013.  The patient is currently in sinus rhythm.  Patient sustained a fall yesterday while at home: He was ambulating with his 4 wheeled walker to his son in the room and he was turning around when he fell and hit his back on a dresser.  He had back pain immediately, which did not improve overnight.  He came to the hospital for evaluation.  Pain is worsened with movement and improved with rest.  Pain is located in the upper lumbar region and does not radiate.  Pain is quite severe, especially with movement.  We were asked to see the patient as the patient is unable to fully care for himself and his primary caregiver is currently hospitalized  Emergency Department Course: X-rays show acute on chronic compression fracture, in particular progression of L2.  Review of Systems:   Pt denies any fevers, chills, nausea, vomiting, diarrhea, constipation, abdominal pain, shortness of breath, dyspnea on exertion, orthopnea, cough, wheezing, palpitations, headache, vision changes, lightheadedness, dizziness, melena, rectal bleeding.  Review of systems are otherwise negative  Past Medical History:  Diagnosis Date  . AF (atrial fibrillation) ([place])   . Bipolar disorder ([place])   . CAD (coronary artery disease)   . COPD (chronic obstructive pulmonary disease) ([place])   . Depression   . Headache   . Heart disease   . Hypertension   . Seizures ([place])   . Stroke [facility])    left arm weakness   Past Surgical History:  Procedure  Laterality Date  . ANKLE FRACTURE SURGERY     Social History:  reports that he has been smoking cigarettes.  He has a 25.00 pack-year smoking history. He has never used smokeless tobacco. He reports that he does not drink alcohol or use drugs. Patient lives at home  Allergies  Allergen Reactions  . Meperidine Hcl Nausea And Vomiting    Family History  Problem Relation Age of Onset  . Cancer Mother   . Cancer Father       Prior to Admission medications   Medication Sig Start Date End Date Taking? Authorizing Provider  albuterol (PROVENTIL HFA) 108 (90 BASE) MCG/ACT inhaler Inhale 2 puffs into the lungs every 6 (six) hours as needed for wheezing or shortness of breath.   Yes [provider]  ALPRAZolam (XANAX) 0.5 MG tablet Take 1 tablet (0.5 mg total) by mouth 4 (four) times daily as needed for anxiety. Patient taking differently: Take 1 mg by mouth 4 (four) times daily as needed for anxiety.  07/09/16  Yes Sinda Du, MD  aspirin EC 81 MG tablet Take 81 mg by mouth daily.   Yes [provider]  divalproex (DEPAKOTE) 250 MG DR tablet Take 3 tablets (750 mg total) by mouth 3 (three) times daily. Patient taking differently: Take 750 mg by mouth at bedtime.  02/10/16  Yes Forde Dandy, MD  midodrine (PROAMATINE) 5 MG tablet Take 1 tablet (5 mg total) by mouth 3 (three) times daily with meals. 01/07/15  Yes Sinda Du, MD  pantoprazole (PROTONIX) 40 MG  tablet Take 40 mg by mouth daily.   Yes [provider]  levETIRAcetam (KEPPRA) 500 MG tablet Take 3 tablets (1,500 mg total) by mouth 2 (two) times daily. Patient not taking: Reported on 05/23/2017 02/10/16   Forde Dandy, MD  nitroGLYCERIN (NITROSTAT) 0.4 MG SL tablet Place 0.4 mg under the tongue every 5 (five) minutes as needed for chest pain.     [provider]    Physical Exam: BP 120/67   Pulse 91   Temp 98.5 F (36.9 C) (Oral)   Resp 18   Ht 5\' 8"  (1.727 m)   Wt 81.6 kg (180 lb)    SpO2 97%   BMI 27.37 kg/m   . General: Elderly Caucasian male. Awake and alert and oriented x3. No acute cardiopulmonary distress.  Marland Kitchen HEENT: Normocephalic atraumatic.  Right and left ears normal in appearance.  Pupils equal, round, reactive to light. Extraocular muscles are intact. Sclerae anicteric and noninjected.  Moist mucosal membranes. No mucosal lesions.  . Neck: Neck supple without lymphadenopathy. No carotid bruits. No masses palpated.  . Cardiovascular: Regular rate with normal S1-S2 sounds. No murmurs, rubs, gallops auscultated. No JVD.  Marland Kitchen Respiratory: Good respiratory effort with no wheezes, rales, rhonchi. Lungs clear to auscultation bilaterally.  No accessory muscle use. . Abdomen: Soft, nontender, nondistended. Active bowel sounds. No masses or hepatosplenomegaly  . Skin: No rashes, lesions, or ulcerations.  Dry, warm to touch. 2+ dorsalis pedis and radial pulses. . Musculoskeletal: Tenderness along the spinous process of L2-3.  No calf or leg pain. All major joints not erythematous nontender.  No upper or lower joint deformation.  Good ROM.  No contractures  . Psychiatric: Intact judgment and insight. Pleasant and cooperative. . Neurologic: No focal neurological deficits. Strength is 5/5 and symmetric in upper and lower extremities.  Cranial nerves II through XII are grossly intact.           Labs on Admission: I have personally reviewed following labs and imaging studies  CBC: Recent Labs  Lab 05/23/17 1540  WBC 8.1  NEUTROABS 4.8  HGB 15.6  HCT 49.3  MCV 99.8  PLT 601   Basic Metabolic Panel: Recent Labs  Lab 05/23/17 1540  NA 137  K 3.9  CL 99*  CO2 30  GLUCOSE 96  BUN 10  CREATININE 0.87  CALCIUM 8.9   GFR: Estimated Creatinine Clearance: 75.3 mL/min (by C-G formula based on SCr of 0.87 mg/dL). Liver Function Tests: Recent Labs  Lab 05/23/17 1540  AST 10*  ALT 8*  ALKPHOS 66  BILITOT 0.8  PROT 7.1  ALBUMIN 3.5   No results for input(s):  LIPASE, AMYLASE in the last 168 hours. No results for input(s): AMMONIA in the last 168 hours. Coagulation Profile: No results for input(s): INR, PROTIME in the last 168 hours. Cardiac Enzymes: Recent Labs  Lab 05/23/17 1540  CKTOTAL 43*   BNP (last 3 results) No results for input(s): PROBNP in the last 8760 hours. HbA1C: No results for input(s): HGBA1C in the last 72 hours. CBG: No results for input(s): GLUCAP in the last 168 hours. Lipid Profile: No results for input(s): CHOL, HDL, LDLCALC, TRIG, CHOLHDL, LDLDIRECT in the last 72 hours. Thyroid Function Tests: No results for input(s): TSH, T4TOTAL, FREET4, T3FREE, THYROIDAB in the last 72 hours. Anemia Panel: No results for input(s): VITAMINB12, FOLATE, FERRITIN, TIBC, IRON, RETICCTPCT in the last 72 hours. Urine analysis:    Component Value Date/Time   COLORURINE YELLOW 07/04/2016 0008  APPEARANCEUR CLEAR 07/04/2016 0008   LABSPEC 1.024 07/04/2016 0008   PHURINE 6.0 07/04/2016 0008   GLUCOSEU NEGATIVE 07/04/2016 0008   HGBUR NEGATIVE 07/04/2016 0008   BILIRUBINUR NEGATIVE 07/04/2016 0008   KETONESUR 20 (A) 07/04/2016 0008   PROTEINUR 30 (A) 07/04/2016 0008   UROBILINOGEN 2.0 (H) 08/10/2011 1731   NITRITE NEGATIVE 07/04/2016 0008   LEUKOCYTESUR NEGATIVE 07/04/2016 0008   Sepsis Labs: @LABRCNTIP (procalcitonin:4,lacticidven:4) )No results found for this or any previous visit (from the past 240 hour(s)).   Radiological Exams on Admission: Dg Chest 1 View  Result Date: 05/23/2017 CLINICAL DATA:  Decreased O2 sats, status post fall yesterday, found down. Low back pain and bilateral hip pain. EXAM: CHEST  1 VIEW COMPARISON:  Chest x-rays dated 07/03/2016 and 01/03/2016. FINDINGS: Stable cardiomegaly. Overall cardiomediastinal silhouette appears stable in size and configuration. Lungs are clear. No pleural effusion or pneumothorax seen. Osseous structures about the chest are unremarkable. IMPRESSION: No active disease.   Stable cardiomegaly. Electronically Signed   By: Franki Cabot M.D.   On: 05/23/2017 17:27   Dg Lumbar Spine Complete  Result Date: 05/23/2017 CLINICAL DATA:  Decreased O2 sats, Lower back pain, bilateral hip pain. Pt tripped and fell yesterday, laid in the floor for a couple hours until found. Pt stated chronic lower back pain with known bulging disc, left sided deficits from prior stroke. EXAM: LUMBAR SPINE - COMPLETE 4+ VIEW COMPARISON:  Plain film of the lumbar spine dated 01/05/2015. FINDINGS: Stable scoliosis. No evidence of acute vertebral body subluxation. No acute appearing fracture line or displaced fracture fragment. Chronic appearing compression fracture deformities are seen at each level of the lumbar spine, all progressed compared to the previous plain film examination, most significant compression deformity at the L2 vertebral body level where there is now approximately 75% compression anteriorly. Atherosclerotic calcifications seen along the walls of the infrarenal abdominal aorta. Visualized paravertebral soft tissues are otherwise unremarkable. IMPRESSION: 1. No acute appearing osseous fracture or dislocation seen. Stable scoliosis. 2. Compression fracture deformities at each level of the lumbar spine, each of indeterminate age but each most likely chronic based on appearance. I cannot, however, exclude acute on chronic compression fracture at any level given the history of back pain and recent trauma. Consider more definitive characterization with lumbar spine MRI if clinically needed. 3. Aortic atherosclerosis. Electronically Signed   By: Franki Cabot M.D.   On: 05/23/2017 17:34   Dg Hips Bilat W Or Wo Pelvis 3-4 Views  Result Date: 05/23/2017 CLINICAL DATA:  Decreased O2 sats, Lower back pain, bilateral hip pain. Pt tripped and fell yesterday, laid in the floor for a couple hours until found. Pt stated chronic lower back pain with known bulging disc, No prior history of hip injury.  EXAM: DG HIP (WITH OR WITHOUT PELVIS) 3-4V BILAT COMPARISON:  None. FINDINGS: Single view of the pelvis and two views of each hip are provided. Osseous alignment is normal. No fracture line or displaced fracture fragment seen. No significant degenerative change at either hip joint. Soft tissues about the pelvis and hips are unremarkable. IMPRESSION: Negative. Electronically Signed   By: Franki Cabot M.D.   On: 05/23/2017 17:29    EKG: Independently reviewed.  Sinus rhythm with sinus arrhythmia.  Right axis deviation.  Borderline T wave abnormalities in leads II, III and aVF, however this is unchanged.  Assessment/Plan: Active Problems:   Hyperlipidemia   Essential hypertension   Seizure disorder (HCC)   COPD (chronic obstructive pulmonary disease) ([place])  Atrial fibrillation (HCC)   Lumbar compression fracture, closed, initial encounter [facility])    This patient was discussed with the ED physician, including pertinent vitals, physical exam findings, labs, and imaging.  We also discussed care given by the ED provider.  1. Lumbar compression fracture  a. Observation as patient's primary caregiver currently hospitalized and patient has no one to take care of him b. Neurovascularly intact c. Pain control d. TLSO brace e. PT consult f. Social work consult as patient may need SNF placement 2. Hypertension  a. Continue antihypertensives 3. Seizure disorder a. According to patient's med list, the patient is supposed to be on Keppra and Depakote, although it is apparent that the patient is not taking Keppra and is only taking 750 mg of Depakote at bedtime.  Will have this verified by PCP tomorrow 4. COPD a. Continue home inhalers 5. Hyperlipidemia a.  6. Atrial fibrillation a. Currently in sinus rhythm  DVT prophylaxis: Lovenox low-dose Consultants: None Code Status: Full code Family Communication: None Disposition Plan: Patient will need SNF placement   Truett Mainland, DO [facility] Pager [phone]  If 7PM-7AM, please contact night-coverage [email or web address] Password [email or web address]

## 2017-05-24 DIAGNOSIS — A419 Sepsis, unspecified organism: Secondary | ICD-10-CM | POA: Diagnosis not present

## 2017-05-24 DIAGNOSIS — J449 Chronic obstructive pulmonary disease, unspecified: Secondary | ICD-10-CM | POA: Diagnosis not present

## 2017-05-24 DIAGNOSIS — S32000A Wedge compression fracture of unspecified lumbar vertebra, initial encounter for closed fracture: Secondary | ICD-10-CM | POA: Diagnosis not present

## 2017-05-24 LAB — BASIC METABOLIC PANEL
ANION GAP: 9 (ref 5–15)
BUN: 14 mg/dL (ref 6–20)
CHLORIDE: 96 mmol/L — AB (ref 101–111)
CO2: 33 mmol/L — ABNORMAL HIGH (ref 22–32)
Calcium: 9.1 mg/dL (ref 8.9–10.3)
Creatinine, Ser: 0.84 mg/dL (ref 0.61–1.24)
GFR calc Af Amer: >60 mL/min (ref >60)
GFR calc non Af Amer: >60 mL/min (ref >60)
Glucose, Bld: 108 mg/dL — ABNORMAL HIGH (ref 65–99)
POTASSIUM: 3.9 mmol/L (ref 3.5–5.1)
SODIUM: 138 mmol/L (ref 135–145)

## 2017-05-24 LAB — CBC
HEMATOCRIT: 48.7 % (ref 39.0–52.0)
HEMOGLOBIN: 15.7 g/dL (ref 13.0–17.0)
MCH: 32.4 pg (ref 26.0–34.0)
MCHC: 32.2 g/dL (ref 30.0–36.0)
MCV: 100.6 fL — AB (ref 78.0–100.0)
Platelets: 226 10*3/uL (ref 150–400)
RBC: 4.84 MIL/uL (ref 4.22–5.81)
RDW: 13.7 % (ref 11.5–15.5)
WBC: 8.8 10*3/uL (ref 4.0–10.5)

## 2017-05-24 MED ORDER — NICOTINE 21 MG/24HR TD PT24
21.0000 mg | MEDICATED_PATCH | Freq: Every day | TRANSDERMAL | Status: DC
  Administered 2017-05-24 – 2017-05-28 (×5): 21 mg via TRANSDERMAL
  Filled 2017-05-24 (×6): qty 1

## 2017-05-24 MED ORDER — LEVETIRACETAM 500 MG PO TABS
500.0000 mg | ORAL_TABLET | Freq: Two times a day (BID) | ORAL | Status: DC
  Administered 2017-05-24 – 2017-05-28 (×9): 500 mg via ORAL
  Filled 2017-05-24 (×9): qty 1

## 2017-05-24 NOTE — Plan of Care (Signed)
  Problem: Acute Rehab PT Goals(only PT should resolve) Goal: Pt will Roll Supine to Side Flowsheets (Taken 05/24/2017 1135) Pt will Roll Supine to Side: with supervision Goal: Pt Will Go Supine/Side To Sit Flowsheets (Taken 05/24/2017 1135) Pt will go Supine/Side to Sit: with supervision Goal: Patient Will Transfer Sit To/From Stand Flowsheets (Taken 05/24/2017 1135) Patient will transfer sit to/from stand: with min guard assist Goal: Pt Will Transfer Bed To Chair/Chair To Bed Flowsheets (Taken 05/24/2017 1135) Pt will Transfer Bed to Chair/Chair to Bed: with min assist Goal: Pt Will Ambulate Flowsheets (Taken 05/24/2017 1135) Pt will Ambulate: 25 feet;with minimal assist;with rolling walker   11:36 AM, 05/24/17 Lonell Grandchild, MPT Physical Therapist with [facility] [phone] office [phone] mobile phone

## 2017-05-24 NOTE — Progress Notes (Signed)
[employer] [phone] called for TLSO brace. Received return call from [place]. Pt name, height and weight given. Ronalee Belts to see patient this am.

## 2017-05-24 NOTE — Progress Notes (Signed)
Subjective: He came to the hospital because he fell and he has a compression fracture in his spine.  He says his pain is fairly well-controlled.  He is wearing a TLSO brace.  He has no other new complaints.  He says his breathing is doing okay.  No seizures.  Objective: Vital signs in last 24 hours: Temp:  [98.3 F (36.8 C)-98.5 F (36.9 C)] 98.3 F (36.8 C) (05/18 0500) Pulse Rate:  [32-91] 59 (05/18 0500) Resp:  [14-26] 16 (05/18 0500) BP: (103-120)/(52-71) 112/63 (05/18 0500) SpO2:  [89 %-99 %] 97 % (05/18 0500) Weight:  [81.6 kg (180 lb)-92.9 kg (204 lb 12.9 oz)] 92.9 kg (204 lb 12.9 oz) (05/17 2312) Weight change:  Last BM Date: 05/23/17  Intake/Output from previous day: No intake/output data recorded.  PHYSICAL EXAM General appearance: alert, cooperative and mild distress Resp: rhonchi bilaterally Cardio: regular rate and rhythm, S1, S2 normal, no murmur, click, rub or gallop GI: soft, non-tender; bowel sounds normal; no masses,  no organomegaly Extremities: extremities normal, atraumatic, no cyanosis or edema  Lab Results:  Results for orders placed or performed during the hospital encounter of 05/23/17 (from the past 48 hour(s))  Comprehensive metabolic panel     Status: Abnormal   Collection Time: 05/23/17  3:40 PM  Result Value Ref Range   Sodium 137 135 - 145 mmol/L   Potassium 3.9 3.5 - 5.1 mmol/L   Chloride 99 (L) 101 - 111 mmol/L   CO2 30 22 - 32 mmol/L   Glucose, Bld 96 65 - 99 mg/dL   BUN 10 6 - 20 mg/dL   Creatinine, Ser 0.87 0.61 - 1.24 mg/dL   Calcium 8.9 8.9 - 10.3 mg/dL   Total Protein 7.1 6.5 - 8.1 g/dL   Albumin 3.5 3.5 - 5.0 g/dL   AST 10 (L) 15 - 41 U/L   ALT 8 (L) 17 - 63 U/L   Alkaline Phosphatase 66 38 - 126 U/L   Total Bilirubin 0.8 0.3 - 1.2 mg/dL   GFR calc non Af Amer >60 >60 mL/min   GFR calc Af Amer >60 >60 mL/min    Comment: (NOTE) The eGFR has been calculated using the CKD EPI equation. This calculation has not been validated in  all clinical situations. eGFR's persistently <60 mL/min signify possible Chronic Kidney Disease.    Anion gap 8 5 - 15    Comment: Performed at [facility], [address], [place], [place] [address]  CBC with Differential/Platelet     Status: None   Collection Time: 05/23/17  3:40 PM  Result Value Ref Range   WBC 8.1 4.0 - 10.5 K/uL   RBC 4.94 4.22 - 5.81 MIL/uL   Hemoglobin 15.6 13.0 - 17.0 g/dL   HCT 49.3 39.0 - 52.0 %   MCV 99.8 78.0 - 100.0 fL   MCH 31.6 26.0 - 34.0 pg   MCHC 31.6 30.0 - 36.0 g/dL   RDW 13.6 11.5 - 15.5 %   Platelets 210 150 - 400 K/uL   Neutrophils Relative % 59 %   Neutro Abs 4.8 1.7 - 7.7 K/uL   Lymphocytes Relative 33 %   Lymphs Abs 2.7 0.7 - 4.0 K/uL   Monocytes Relative 6 %   Monocytes Absolute 0.5 0.1 - 1.0 K/uL   Eosinophils Relative 2 %   Eosinophils Absolute 0.2 0.0 - 0.7 K/uL   Basophils Relative 0 %   Basophils Absolute 0.0 0.0 - 0.1 K/uL    Comment: Performed  at [facility], [address], [place], [place] [address]  CK     Status: Abnormal   Collection Time: 05/23/17  3:40 PM  Result Value Ref Range   Total CK 43 (L) 49 - 397 U/L    Comment: Performed at [facility], [address] [place], [place] [address]  Magnesium     Status: None   Collection Time: 05/23/17  3:40 PM  Result Value Ref Range   Magnesium 2.1 1.7 - 2.4 mg/dL    Comment: Performed at [facility], [address], [place], [place] [address]  Phosphorus     Status: None   Collection Time: 05/23/17  3:40 PM  Result Value Ref Range   Phosphorus 3.4 2.5 - 4.6 mg/dL    Comment: Performed at [facility], [address], [place], [place] [address]  Basic metabolic panel     Status: Abnormal   Collection Time: 05/24/17  6:32 AM  Result Value Ref Range   Sodium 138 135 - 145 mmol/L   Potassium 3.9 3.5 - 5.1 mmol/L   Chloride 96 (L) 101 - 111 mmol/L   CO2 33 (H) 22 - 32 mmol/L   Glucose, Bld 108 (H) 65 - 99 mg/dL   BUN 14 6 - 20 mg/dL   Creatinine, Ser 0.84 0.61 - 1.24  mg/dL   Calcium 9.1 8.9 - 10.3 mg/dL   GFR calc non Af Amer >60 >60 mL/min   GFR calc Af Amer >60 >60 mL/min    Comment: (NOTE) The eGFR has been calculated using the CKD EPI equation. This calculation has not been validated in all clinical situations. eGFR's persistently <60 mL/min signify possible Chronic Kidney Disease.    Anion gap 9 5 - 15    Comment: Performed at [facility], [address] [place], [place] [address]  CBC     Status: Abnormal   Collection Time: 05/24/17  6:32 AM  Result Value Ref Range   WBC 8.8 4.0 - 10.5 K/uL   RBC 4.84 4.22 - 5.81 MIL/uL   Hemoglobin 15.7 13.0 - 17.0 g/dL   HCT 48.7 39.0 - 52.0 %   MCV 100.6 (H) 78.0 - 100.0 fL   MCH 32.4 26.0 - 34.0 pg   MCHC 32.2 30.0 - 36.0 g/dL   RDW 13.7 11.5 - 15.5 %   Platelets 226 150 - 400 K/uL    Comment: Performed at [facility], [address], [place], [place] [address]    ABGS No results for input(s): PHART, PO2ART, TCO2, HCO3 in the last 72 hours.  Invalid input(s): PCO2 CULTURES No results found for this or any previous visit (from the past 240 hour(s)). Studies/Results: Dg Chest 1 View  Result Date: 05/23/2017 CLINICAL DATA:  Decreased O2 sats, status post fall yesterday, found down. Low back pain and bilateral hip pain. EXAM: CHEST  1 VIEW COMPARISON:  Chest x-rays dated 07/03/2016 and 01/03/2016. FINDINGS: Stable cardiomegaly. Overall cardiomediastinal silhouette appears stable in size and configuration. Lungs are clear. No pleural effusion or pneumothorax seen. Osseous structures about the chest are unremarkable. IMPRESSION: No active disease.  Stable cardiomegaly. Electronically Signed   By: Franki Cabot M.D.   On: 05/23/2017 17:27   Dg Lumbar Spine Complete  Result Date: 05/23/2017 CLINICAL DATA:  Decreased O2 sats, Lower back pain, bilateral hip pain. Pt tripped and fell yesterday, laid in the floor for a couple hours until found. Pt stated chronic lower back pain with known bulging disc, left  sided deficits  from prior stroke. EXAM: LUMBAR SPINE - COMPLETE 4+ VIEW COMPARISON:  Plain film of the lumbar spine dated 01/05/2015. FINDINGS: Stable scoliosis. No evidence of acute vertebral body subluxation. No acute appearing fracture line or displaced fracture fragment. Chronic appearing compression fracture deformities are seen at each level of the lumbar spine, all progressed compared to the previous plain film examination, most significant compression deformity at the L2 vertebral body level where there is now approximately 75% compression anteriorly. Atherosclerotic calcifications seen along the walls of the infrarenal abdominal aorta. Visualized paravertebral soft tissues are otherwise unremarkable. IMPRESSION: 1. No acute appearing osseous fracture or dislocation seen. Stable scoliosis. 2. Compression fracture deformities at each level of the lumbar spine, each of indeterminate age but each most likely chronic based on appearance. I cannot, however, exclude acute on chronic compression fracture at any level given the history of back pain and recent trauma. Consider more definitive characterization with lumbar spine MRI if clinically needed. 3. Aortic atherosclerosis. Electronically Signed   By: Franki Cabot M.D.   On: 05/23/2017 17:34   Dg Hips Bilat W Or Wo Pelvis 3-4 Views  Result Date: 05/23/2017 CLINICAL DATA:  Decreased O2 sats, Lower back pain, bilateral hip pain. Pt tripped and fell yesterday, laid in the floor for a couple hours until found. Pt stated chronic lower back pain with known bulging disc, No prior history of hip injury. EXAM: DG HIP (WITH OR WITHOUT PELVIS) 3-4V BILAT COMPARISON:  None. FINDINGS: Single view of the pelvis and two views of each hip are provided. Osseous alignment is normal. No fracture line or displaced fracture fragment seen. No significant degenerative change at either hip joint. Soft tissues about the pelvis and hips are unremarkable. IMPRESSION: Negative.  Electronically Signed   By: Franki Cabot M.D.   On: 05/23/2017 17:29    Medications:  Prior to Admission:  Medications Prior to Admission  Medication Sig Dispense Refill Last Dose  . albuterol (PROVENTIL HFA) 108 (90 BASE) MCG/ACT inhaler Inhale 2 puffs into the lungs every 6 (six) hours as needed for wheezing or shortness of breath.   05/22/2017 at Unknown time  . ALPRAZolam (XANAX) 0.5 MG tablet Take 1 tablet (0.5 mg total) by mouth 4 (four) times daily as needed for anxiety. (Patient taking differently: Take 1 mg by mouth 4 (four) times daily as needed for anxiety. ) 60 tablet 5 05/23/2017 at Unknown time  . aspirin EC 81 MG tablet Take 81 mg by mouth daily.   05/22/2017 at Unknown time  . divalproex (DEPAKOTE) 250 MG DR tablet Take 3 tablets (750 mg total) by mouth 3 (three) times daily. (Patient taking differently: Take 750 mg by mouth at bedtime. ) 180 tablet 2 05/22/2017 at Unknown time  . midodrine (PROAMATINE) 5 MG tablet Take 1 tablet (5 mg total) by mouth 3 (three) times daily with meals.   05/23/2017 at Unknown time  . pantoprazole (PROTONIX) 40 MG tablet Take 40 mg by mouth daily.   05/23/2017 at Unknown time  . levETIRAcetam (KEPPRA) 500 MG tablet Take 3 tablets (1,500 mg total) by mouth 2 (two) times daily. (Patient not taking: Reported on 05/23/2017) 180 tablet 3 Not Taking at Unknown time  . nitroGLYCERIN (NITROSTAT) 0.4 MG SL tablet Place 0.4 mg under the tongue every 5 (five) minutes as needed for chest pain.    unknown   Scheduled: . aspirin EC  81 mg Oral Daily  . divalproex  750 mg Oral QHS  . enoxaparin (LOVENOX) injection  40 mg Subcutaneous Q24H  . ibuprofen  600 mg Oral Q6H  . levETIRAcetam  500 mg Oral BID  . midodrine  5 mg Oral TID WC  . nicotine  21 mg Transdermal Daily  . pantoprazole  40 mg Oral Daily   Continuous:  TNB:[id number] **OR** acetaminophen, albuterol, ALPRAZolam, HYDROcodone-acetaminophen, ondansetron **OR** ondansetron (ZOFRAN) IV  Assesment: He  has lumbar compression fracture.  This is a result of a fall.  He has hypertension which is pretty well controlled  He has had a stroke but no recent neurological changes  He has COPD and is still smoking cigarettes so he is going to have a nicotine patch  He has seizure disorder following his stroke and he was not on Keppra so I have restarted that  He has bipolar disease and is on Depakote for that.  The Depakote is not for his seizures. Active Problems:   Hyperlipidemia   Essential hypertension   Seizure disorder (HCC)   COPD (chronic obstructive pulmonary disease) (HCC)   Atrial fibrillation (HCC)   Lumbar compression fracture, closed, initial encounter [facility])    Plan: Continue current treatments PT evaluation is underway.  I think he will likely need skilled care facility placement    LOS: 0 days   Teiana Hajduk L 05/24/2017, 10:26 AM

## 2017-05-24 NOTE — Evaluation (Addendum)
Physical Therapy Evaluation Patient Details Name: Jeffrey Sweeney MRN: [id number] DOB: 04-01-1945 Today's Date: 05/24/2017   History of Present Illness  Jeffrey Sweeney is a 72 y.o. male with a history of COPD, coronary artery disease, hypertension, seizure disorder, history of stroke with residual left arm weakness, history of atrial fibrillation after syncope and rib fracture in 2013.  The patient is currently in sinus rhythm.  Patient sustained a fall yesterday while at home: He was ambulating with his 4 wheeled walker to his son in the room and he was turning around when he fell and hit his back on a dresser.  He had back pain immediately, which did not improve overnight.  He came to the hospital for evaluation.  Pain is worsened with movement and improved with rest.  Pain is located in the upper lumbar region and does not radiate.  Pain is quite severe, especially with movement.    Clinical Impression  Patient presents with TLSO brace on, very unsteady on feet with difficulty balancing due to left sided weakness and poor coordination for using RW with tendency to lift walker with LUE and poor return for advancing LLE when taking steps.  Patient very fearful of falling and limited for taking steps due to weakness.  Patient will benefit from continued physical therapy in hospital and recommended venue below to increase strength, balance, endurance for safe ADLs and gait.    Follow Up Recommendations SNF;Supervision/Assistance - 24 hour    Equipment Recommendations  None recommended by PT    Recommendations for Other Services       Precautions / Restrictions Precautions Precautions: Fall Restrictions Weight Bearing Restrictions: No      Mobility  Bed Mobility Overal bed mobility: Needs Assistance Bed Mobility: Rolling;Sidelying to Sit Rolling: Min assist Sidelying to sit: Min assist;Mod assist       General bed mobility comments: slow labored movement  Transfers Overall  transfer level: Needs assistance Equipment used: Rolling walker (2 wheeled) Transfers: Sit to/from [facility] Sit to Stand: Min assist Stand pivot transfers: Mod assist       General transfer comment: very unsteady on feet, requires verbal/tactile cueing for safety due to lifting RW with left hand when standing  Ambulation/Gait Ambulation/Gait assistance: Mod assist;Max assist Ambulation Distance (Feet): 5 Feet Assistive device: Rolling walker (2 wheeled) Gait Pattern/deviations: Decreased step length - right;Decreased step length - left;Decreased stance time - left;Decreased stride length Gait velocity: slow   General Gait Details: limited to 5-6 unsteady labored steps at bedside due to lifting RW with LUE and placing on top of foot causing pain when weight bearing with upper body, required verbal/tactile cue for patient to keep feet inside of RW  Stairs            Wheelchair Mobility    Modified Rankin (Stroke Patients Only)       Balance Overall balance assessment: Needs assistance Sitting-balance support: Feet supported;No upper extremity supported Sitting balance-Leahy Scale: Good     Standing balance support: Bilateral upper extremity supported;During functional activity Standing balance-Leahy Scale: Poor Standing balance comment: fair/poor with RW                             Pertinent Vitals/Pain Pain Assessment: No/denies pain    Home Living Family/patient expects to be discharged to:: Private residence Living Arrangements: Children Available Help at Discharge: Family Type of Home: Mobile home Home Access: Stairs to enter Entrance  Stairs-Rails: Right Entrance Stairs-Number of Steps: 1 Home Layout: One level Home Equipment: Walker - 4 wheels;Shower seat;Bedside commode;Cane - single point;Wheelchair - manual Additional Comments: patient uses BSC as a shower chair    Prior Function Level of Independence: Needs assistance    Gait / Transfers Assistance Needed: household ambulator with Rollator  ADL's / Homemaking Assistance Needed: community ADLs assisted by son        Hand Dominance   Dominant Hand: Right    Extremity/Trunk Assessment   Upper Extremity Assessment Upper Extremity Assessment: Generalized weakness;RUE deficits/detail;LUE deficits/detail RUE Deficits / Details: grossly 4/5 LUE Deficits / Details: grossly 2+/5 with hand grip 3+/5    Lower Extremity Assessment Lower Extremity Assessment: Generalized weakness;RLE deficits/detail;LLE deficits/detail RLE Deficits / Details: grossly 4/5 LLE Deficits / Details: grossly 3+/5    Cervical / Trunk Assessment Cervical / Trunk Assessment: Normal  Communication   Communication: No difficulties  Cognition Arousal/Alertness: Awake/alert Behavior During Therapy: WFL for tasks assessed/performed Overall Cognitive Status: Within Functional Limits for tasks assessed                                        General Comments      Exercises     Assessment/Plan    PT Assessment Patient needs continued PT services  PT Problem List Decreased strength;Decreased activity tolerance;Decreased balance;Decreased mobility       PT Treatment Interventions Gait training;Stair training;Functional mobility training;Therapeutic activities;Therapeutic exercise;Patient/family education    PT Goals (Current goals can be found in the Care Plan section)  Acute Rehab PT Goals Patient Stated Goal: return home after rehab PT Goal Formulation: With patient/family Time For Goal Achievement: 06/07/17 Potential to Achieve Goals: Good    Frequency Min 3X/week   Barriers to discharge        Co-evaluation               AM-PAC PT "6 Clicks" Daily Activity  Outcome Measure Difficulty turning over in bed (including adjusting bedclothes, sheets and blankets)?: A Little Difficulty moving from lying on back to sitting on the side of the  bed? : A Little Difficulty sitting down on and standing up from a chair with arms (e.g., wheelchair, bedside commode, etc,.)?: A Lot Help needed moving to and from a bed to chair (including a wheelchair)?: A Lot Help needed walking in hospital room?: A Lot Help needed climbing 3-5 steps with a railing? : Total 6 Click Score: 13    End of Session Equipment Utilized During Treatment: Gait belt Activity Tolerance: Patient tolerated treatment well;Patient limited by fatigue Patient left: in chair;with call bell/phone within reach;with family/visitor present Nurse Communication: Mobility status PT Visit Diagnosis: Unsteadiness on feet (R26.81);Other abnormalities of gait and mobility (R26.89);Muscle weakness (generalized) (M62.81)    Time: 5093-2671 PT Time Calculation (min) (ACUTE ONLY): 29 min   Charges:   PT Evaluation $PT Eval Moderate Complexity: 1 Mod PT Treatments $Therapeutic Activity: 23-37 mins   PT G Codes:        11:33 AM, 06-01-17 Lonell Grandchild, MPT Physical Therapist with [facility] 336 [phone] office [phone] mobile phone

## 2017-05-25 DIAGNOSIS — A419 Sepsis, unspecified organism: Secondary | ICD-10-CM | POA: Diagnosis not present

## 2017-05-25 DIAGNOSIS — J449 Chronic obstructive pulmonary disease, unspecified: Secondary | ICD-10-CM | POA: Diagnosis not present

## 2017-05-25 DIAGNOSIS — S32000A Wedge compression fracture of unspecified lumbar vertebra, initial encounter for closed fracture: Secondary | ICD-10-CM | POA: Diagnosis not present

## 2017-05-25 NOTE — Progress Notes (Signed)
Subjective: He says he feels about the same.  He is still having pain in his back.  He had some nausea last night that may have been related to taking ibuprofen.  No other new complaints.  No seizures.  No chest pain.  No complaints of shortness of breath  Objective: Vital signs in last 24 hours: Temp:  [97.5 F (36.4 C)-98.3 F (36.8 C)] 98 F (36.7 C) (05/19 0647) Pulse Rate:  [41-103] 103 (05/19 0647) Resp:  [18-20] 18 (05/19 0647) BP: (123-151)/(70-83) 137/71 (05/19 0647) SpO2:  [94 %-95 %] 94 % (05/19 0647) Weight change:  Last BM Date: 05/23/17  Intake/Output from previous day: 05/18 0701 - 05/19 0700 In: 720 [P.O.:720] Out: 275 [Urine:275]  PHYSICAL EXAM General appearance: alert, cooperative and no distress Resp: clear to auscultation bilaterally Cardio: regular rate and rhythm, S1, S2 normal, no murmur, click, rub or gallop GI: soft, non-tender; bowel sounds normal; no masses,  no organomegaly Extremities: extremities normal, atraumatic, no cyanosis or edema  Lab Results:  Results for orders placed or performed during the hospital encounter of 05/23/17 (from the past 48 hour(s))  Comprehensive metabolic panel     Status: Abnormal   Collection Time: 05/23/17  3:40 PM  Result Value Ref Range   Sodium 137 135 - 145 mmol/L   Potassium 3.9 3.5 - 5.1 mmol/L   Chloride 99 (L) 101 - 111 mmol/L   CO2 30 22 - 32 mmol/L   Glucose, Bld 96 65 - 99 mg/dL   BUN 10 6 - 20 mg/dL   Creatinine, Ser 0.87 0.61 - 1.24 mg/dL   Calcium 8.9 8.9 - 10.3 mg/dL   Total Protein 7.1 6.5 - 8.1 g/dL   Albumin 3.5 3.5 - 5.0 g/dL   AST 10 (L) 15 - 41 U/L   ALT 8 (L) 17 - 63 U/L   Alkaline Phosphatase 66 38 - 126 U/L   Total Bilirubin 0.8 0.3 - 1.2 mg/dL   GFR calc non Af Amer >60 >60 mL/min   GFR calc Af Amer >60 >60 mL/min    Comment: (NOTE) The eGFR has been calculated using the CKD EPI equation. This calculation has not been validated in all clinical situations. eGFR's persistently <60  mL/min signify possible Chronic Kidney Disease.    Anion gap 8 5 - 15    Comment: Performed at [facility], [address], [place], [place] [address]  CBC with Differential/Platelet     Status: None   Collection Time: 05/23/17  3:40 PM  Result Value Ref Range   WBC 8.1 4.0 - 10.5 K/uL   RBC 4.94 4.22 - 5.81 MIL/uL   Hemoglobin 15.6 13.0 - 17.0 g/dL   HCT 49.3 39.0 - 52.0 %   MCV 99.8 78.0 - 100.0 fL   MCH 31.6 26.0 - 34.0 pg   MCHC 31.6 30.0 - 36.0 g/dL   RDW 13.6 11.5 - 15.5 %   Platelets 210 150 - 400 K/uL   Neutrophils Relative % 59 %   Neutro Abs 4.8 1.7 - 7.7 K/uL   Lymphocytes Relative 33 %   Lymphs Abs 2.7 0.7 - 4.0 K/uL   Monocytes Relative 6 %   Monocytes Absolute 0.5 0.1 - 1.0 K/uL   Eosinophils Relative 2 %   Eosinophils Absolute 0.2 0.0 - 0.7 K/uL   Basophils Relative 0 %   Basophils Absolute 0.0 0.0 - 0.1 K/uL    Comment: Performed at [facility], [address], [place], [place] [address]  CK     Status: Abnormal   Collection Time: 05/23/17  3:40 PM  Result Value Ref Range   Total CK 43 (L) 49 - 397 U/L    Comment: Performed at [facility], [address], [place], [place] [address]  Magnesium     Status: None   Collection Time: 05/23/17  3:40 PM  Result Value Ref Range   Magnesium 2.1 1.7 - 2.4 mg/dL    Comment: Performed at [facility], [address] [place], [place] [address]  Phosphorus     Status: None   Collection Time: 05/23/17  3:40 PM  Result Value Ref Range   Phosphorus 3.4 2.5 - 4.6 mg/dL    Comment: Performed at [facility], [address], [place], [place] [address]  Basic metabolic panel     Status: Abnormal   Collection Time: 05/24/17  6:32 AM  Result Value Ref Range   Sodium 138 135 - 145 mmol/L   Potassium 3.9 3.5 - 5.1 mmol/L   Chloride 96 (L) 101 - 111 mmol/L   CO2 33 (H) 22 - 32 mmol/L   Glucose, Bld 108 (H) 65 - 99 mg/dL   BUN 14 6 - 20 mg/dL   Creatinine, Ser 0.84 0.61 - 1.24 mg/dL   Calcium 9.1 8.9 - 10.3 mg/dL   GFR calc  non Af Amer >60 >60 mL/min   GFR calc Af Amer >60 >60 mL/min    Comment: (NOTE) The eGFR has been calculated using the CKD EPI equation. This calculation has not been validated in all clinical situations. eGFR's persistently <60 mL/min signify possible Chronic Kidney Disease.    Anion gap 9 5 - 15    Comment: Performed at [facility], [address] [place], [place] [address]  CBC     Status: Abnormal   Collection Time: 05/24/17  6:32 AM  Result Value Ref Range   WBC 8.8 4.0 - 10.5 K/uL   RBC 4.84 4.22 - 5.81 MIL/uL   Hemoglobin 15.7 13.0 - 17.0 g/dL   HCT 48.7 39.0 - 52.0 %   MCV 100.6 (H) 78.0 - 100.0 fL   MCH 32.4 26.0 - 34.0 pg   MCHC 32.2 30.0 - 36.0 g/dL   RDW 13.7 11.5 - 15.5 %   Platelets 226 150 - 400 K/uL    Comment: Performed at [facility], [address] [place], [place] [address]    ABGS No results for input(s): PHART, PO2ART, TCO2, HCO3 in the last 72 hours.  Invalid input(s): PCO2 CULTURES No results found for this or any previous visit (from the past 240 hour(s)). Studies/Results: Dg Chest 1 View  Result Date: 05/23/2017 CLINICAL DATA:  Decreased O2 sats, status post fall yesterday, found down. Low back pain and bilateral hip pain. EXAM: CHEST  1 VIEW COMPARISON:  Chest x-rays dated 07/03/2016 and 01/03/2016. FINDINGS: Stable cardiomegaly. Overall cardiomediastinal silhouette appears stable in size and configuration. Lungs are clear. No pleural effusion or pneumothorax seen. Osseous structures about the chest are unremarkable. IMPRESSION: No active disease.  Stable cardiomegaly. Electronically Signed   By: Franki Cabot M.D.   On: 05/23/2017 17:27   Dg Lumbar Spine Complete  Result Date: 05/23/2017 CLINICAL DATA:  Decreased O2 sats, Lower back pain, bilateral hip pain. Pt tripped and fell yesterday, laid in the floor for a couple hours until found. Pt stated chronic lower back pain with known bulging disc, left sided deficits from prior stroke. EXAM: LUMBAR  SPINE - COMPLETE 4+ VIEW COMPARISON:  Plain film of the lumbar spine dated 01/05/2015. FINDINGS: Stable scoliosis. No evidence of acute vertebral body subluxation. No acute appearing fracture line or displaced fracture fragment. Chronic appearing compression fracture deformities are seen at each level of the lumbar spine, all progressed compared to the previous plain film examination, most significant compression deformity at the L2 vertebral body level where there is now approximately 75% compression anteriorly. Atherosclerotic calcifications seen along the walls of the infrarenal abdominal aorta. Visualized paravertebral soft tissues are otherwise unremarkable. IMPRESSION: 1. No acute appearing osseous fracture or dislocation seen. Stable scoliosis. 2. Compression fracture deformities at each level of the lumbar spine, each of indeterminate age but each most likely chronic based on appearance. I cannot, however, exclude acute on chronic compression fracture at any level given the history of back pain and recent trauma. Consider more definitive characterization with lumbar spine MRI if clinically needed. 3. Aortic atherosclerosis. Electronically Signed   By: Franki Cabot M.D.   On: 05/23/2017 17:34   Dg Hips Bilat W Or Wo Pelvis 3-4 Views  Result Date: 05/23/2017 CLINICAL DATA:  Decreased O2 sats, Lower back pain, bilateral hip pain. Pt tripped and fell yesterday, laid in the floor for a couple hours until found. Pt stated chronic lower back pain with known bulging disc, No prior history of hip injury. EXAM: DG HIP (WITH OR WITHOUT PELVIS) 3-4V BILAT COMPARISON:  None. FINDINGS: Single view of the pelvis and two views of each hip are provided. Osseous alignment is normal. No fracture line or displaced fracture fragment seen. No significant degenerative change at either hip joint. Soft tissues about the pelvis and hips are unremarkable. IMPRESSION: Negative. Electronically Signed   By: Franki Cabot M.D.   On:  05/23/2017 17:29    Medications:  Prior to Admission:  Medications Prior to Admission  Medication Sig Dispense Refill Last Dose  . albuterol (PROVENTIL HFA) 108 (90 BASE) MCG/ACT inhaler Inhale 2 puffs into the lungs every 6 (six) hours as needed for wheezing or shortness of breath.   05/22/2017 at Unknown time  . ALPRAZolam (XANAX) 0.5 MG tablet Take 1 tablet (0.5 mg total) by mouth 4 (four) times daily as needed for anxiety. (Patient taking differently: Take 1 mg by mouth 4 (four) times daily as needed for anxiety. ) 60 tablet 5 05/23/2017 at Unknown time  . aspirin EC 81 MG tablet Take 81 mg by mouth daily.   05/22/2017 at Unknown time  . divalproex (DEPAKOTE) 250 MG DR tablet Take 3 tablets (750 mg total) by mouth 3 (three) times daily. (Patient taking differently: Take 750 mg by mouth at bedtime. ) 180 tablet 2 05/22/2017 at Unknown time  . midodrine (PROAMATINE) 5 MG tablet Take 1 tablet (5 mg total) by mouth 3 (three) times daily with meals.   05/23/2017 at Unknown time  . pantoprazole (PROTONIX) 40 MG tablet Take 40 mg by mouth daily.   05/23/2017 at Unknown time  . levETIRAcetam (KEPPRA) 500 MG tablet Take 3 tablets (1,500 mg total) by mouth 2 (two) times daily. (Patient not taking: Reported on 05/23/2017) 180 tablet 3 Not Taking at Unknown time  . nitroGLYCERIN (NITROSTAT) 0.4 MG SL tablet Place 0.4 mg under the tongue every 5 (five) minutes as needed for chest pain.    unknown   Scheduled: . aspirin EC  81 mg Oral Daily  . divalproex  750 mg Oral QHS  . enoxaparin (LOVENOX) injection  40 mg Subcutaneous Q24H  . ibuprofen  600 mg Oral  Q6H  . levETIRAcetam  500 mg Oral BID  . midodrine  5 mg Oral TID WC  . nicotine  21 mg Transdermal Daily  . pantoprazole  40 mg Oral Daily   Continuous:  DNR:[id number] **OR** acetaminophen, albuterol, ALPRAZolam, HYDROcodone-acetaminophen, ondansetron **OR** ondansetron (ZOFRAN) IV  Assesment: He has lumbar compression fracture and still has pain.   It is felt that he is going to need a skilled care facility stay  He has seizure disorder but no seizures I have restarted his Keppra  He has hypertension which is pretty well controlled  He has COPD with ongoing smoking but no complaints of shortness of breath  He has significant issues with both anxiety and depression/bipolar disease and he is on Depakote for that Active Problems:   Hyperlipidemia   Essential hypertension   Seizure disorder (HCC)   COPD (chronic obstructive pulmonary disease) (HCC)   Atrial fibrillation (HCC)   Lumbar compression fracture, closed, initial encounter ([place])    Plan: Continue treatments    LOS: 0 days   Lakiah Dhingra L 05/25/2017, 9:55 AM

## 2017-05-26 NOTE — Clinical Social Work Note (Signed)
Clinical Social Work Assessment  Patient Details  Name: Jeffrey Sweeney MRN: [id number] Date of Birth: 30-Jun-1945  Date of referral:  05/26/17               Reason for consult:  Facility Placement                Permission sought to share information with:    Permission granted to share information::     Name::        Agency::     Relationship::     Contact Information:     Housing/Transportation Living arrangements for the past 2 months:  Single Family Home Source of Information:  Patient Patient Interpreter Needed:  None Criminal Activity/Legal Involvement Pertinent to Current Situation/Hospitalization:  No - Comment as needed Significant Relationships:  Adult Children Lives with:  Adult Children Do you feel safe going back to the place where you live?  Yes Need for family participation in patient care:  Yes (Comment)  Care giving concerns:  None identified by patient at baseline.     Social Worker assessment / plan:  Patient's son lives in the home with him. At  Baseline patient ambulates with a 4 wheeled rolling walker. Patient that he takes sink baths because his tub is too high to get in to.  He states that his son washes his hair for him and cooks for him.  He is agreeable to STR at [facility].  Employment status:  Retired Nurse, adult PT Recommendations:  [place] / Referral to community resources:  [place]  Patient/Family's Response to care:  Patient is agreeable to STR at [facility].   Patient/Family's Understanding of and Emotional Response to Diagnosis, Current Treatment, and Prognosis:  Patient understands his diagnosis, treatment and prognosis and feels that STR at SNF is needed based on this knowledge.   Emotional Assessment Appearance:  Appears stated age Attitude/Demeanor/Rapport:    Affect (typically observed):  Accepting, Calm Orientation:  Oriented to Self, Oriented to Place, Oriented to  Time,  Oriented to Situation Alcohol / Substance use:  Not Applicable Psych involvement (Current and /or in the community):  No (Comment)  Discharge Needs  Concerns to be addressed:  Discharge Planning Concerns Readmission within the last 30 days:  No Current discharge risk:  None Barriers to Discharge:  Insurance Authorization   Ihor Gully, LCSW 05/26/2017, 3:18 PM

## 2017-05-26 NOTE — NC FL2 (Signed)
[place] LEVEL OF CARE SCREENING TOOL     IDENTIFICATION  Patient Name: Jeffrey Sweeney Birthdate: November 03, 1945 Sex: male Admission Date (Current Location): 05/23/2017  [facility] and [place] Number:  [employer] and Address:  [place] [address] [place]      Provider Number: [id number]  Attending Physician Name and Address:  Sinda Du, MD  Relative Name and Phone Number:       Current Level of Care: Other (Comment)(observation) Recommended Level of Care: [place] Prior Approval Number:    Date Approved/Denied:   PASRR Number:    Discharge Plan: SNF    Current Diagnoses: Patient Active Problem List   Diagnosis Date Noted  . Lumbar compression fracture, closed, initial encounter ([place]) 05/23/2017  . Hemiparesis affecting left side as late effect of cerebrovascular accident (CVA) ([place]) 07/08/2016  . Personal history of stroke with current residual effects 07/08/2016  . Seizure ([place]) 07/04/2016  . Status epilepticus ([place]) 01/09/2016  . Osteoporosis 01/07/2015  . Compression fracture of lumbar spine, non-traumatic ([place]) 01/07/2015  . COPD exacerbation ([place]) 01/05/2015  . Falls 01/05/2015  . Hypokalemia 08/14/2011  . DVT (deep venous thrombosis) ([place]) 08/14/2011  . Atrial fibrillation ([place]) 08/13/2011  . Rib fractures 08/07/2011  . Respiratory distress 08/07/2011  . COPD (chronic obstructive pulmonary disease) ([place]) 08/07/2011  . Atelectasis 08/07/2011  . H/O chest tube placement 08/07/2011  . Hemothorax 08/07/2011  . Hyperlipidemia 07/17/2010  . ANXIETY DEPRESSION 07/17/2010  . Essential hypertension 07/17/2010  . Coronary atherosclerosis 07/17/2010  . CORONARY ATHEROSCLEROSIS NATIVE CORONARY ARTERY 07/17/2010  . Stroke ([place]) 07/17/2010  . CEREBROVASCULAR DISEASE 07/17/2010  . GERD 07/17/2010  . Seizure disorder ([place]) 07/17/2010  . SPLENIC INFARCTION 07/05/2009  . EMBOLISM&THROMBOSIS OF  OTHER SPECIFIED ARTERY 07/05/2009  . EARLY SATIETY 07/05/2009  . WEIGHT LOSS, ABNORMAL 07/05/2009  . DIARRHEA 07/05/2009  . LUQ PAIN 07/05/2009  . PERSONAL HISTORY OF COLONIC POLYPS 07/05/2009  . Orthostatic hypotension 05/01/2009  . SYNCOPE 05/01/2009  . CHEST PAIN 05/01/2009  . LOW BACK PAIN, MILD 04/20/2009  . Bipolar 1 disorder, mixed, moderate ([place]) 04/20/2009    Orientation RESPIRATION BLADDER Height & Weight     Self, Time, Situation, Place  Normal Continent Weight: 204 lb 12.9 oz (92.9 kg) Height:  5\' 8"  (172.7 cm)  BEHAVIORAL SYMPTOMS/MOOD NEUROLOGICAL BOWEL NUTRITION STATUS      Continent Diet(Heart healthy)  AMBULATORY STATUS COMMUNICATION OF NEEDS Skin   Limited Assist Verbally Normal                       Personal Care Assistance Level of Assistance  Bathing, Feeding, Dressing Bathing Assistance: Limited assistance Feeding assistance: Independent Dressing Assistance: Limited assistance     Functional Limitations Info  Sight, Hearing, Speech Sight Info: Adequate Hearing Info: Adequate Speech Info: Adequate    SPECIAL CARE FACTORS FREQUENCY  PT (By licensed PT)     PT Frequency: 5x/week              Contractures Contractures Info: Not present    Additional Factors Info  Code Status, Allergies, Psychotropic Code Status Info: Full Code Allergies Info: Meperidine Hcl Psychotropic Info: Xanax, Depakote         Current Medications (05/26/2017):  This is the current hospital active medication list Current Facility-Administered Medications  Medication Dose Route Frequency Provider Last Rate Last Dose  . acetaminophen (TYLENOL) tablet 650 mg  650 mg Oral Q6H PRN  Truett Mainland, DO       Or  . acetaminophen (TYLENOL) suppository 650 mg  650 mg Rectal Q6H PRN Truett Mainland, DO      . albuterol (PROVENTIL) (2.5 MG/3ML) 0.083% nebulizer solution 3 mL  3 mL Inhalation Q6H PRN Truett Mainland, DO      . ALPRAZolam Duanne Moron) tablet 0.5 mg  0.5 mg  Oral QID PRN Truett Mainland, DO   0.5 mg at 05/26/17 0981  . aspirin EC tablet 81 mg  81 mg Oral Daily Truett Mainland, DO   81 mg at 05/26/17 1914  . divalproex (DEPAKOTE) DR tablet 750 mg  750 mg Oral QHS Truett Mainland, DO   750 mg at 05/25/17 2103  . enoxaparin (LOVENOX) injection 40 mg  40 mg Subcutaneous Q24H Truett Mainland, DO   40 mg at 05/26/17 7829  . HYDROcodone-acetaminophen (NORCO/VICODIN) 5-325 MG per tablet 1-2 tablet  1-2 tablet Oral Q4H PRN Truett Mainland, DO   2 tablet at 05/24/17 1550  . ibuprofen (ADVIL,MOTRIN) tablet 600 mg  600 mg Oral Q6H Truett Mainland, DO   600 mg at 05/26/17 1217  . levETIRAcetam (KEPPRA) tablet 500 mg  500 mg Oral BID Sinda Du, MD   500 mg at 05/26/17 5621  . midodrine (PROAMATINE) tablet 5 mg  5 mg Oral TID WC Truett Mainland, DO   5 mg at 05/26/17 1217  . nicotine (NICODERM CQ - dosed in mg/24 hours) patch 21 mg  21 mg Transdermal Daily Sinda Du, MD   21 mg at 05/26/17 3086  . ondansetron (ZOFRAN) tablet 4 mg  4 mg Oral Q6H PRN Truett Mainland, DO       Or  . ondansetron [facility]) injection 4 mg  4 mg Intravenous Q6H PRN Truett Mainland, DO   4 mg at 05/25/17 0124  . pantoprazole (PROTONIX) EC tablet 40 mg  40 mg Oral Daily Truett Mainland, DO   40 mg at 05/26/17 5784     Discharge Medications: Please see discharge summary for a list of discharge medications.  Relevant Imaging Results:  Relevant Lab Results:   Additional Information SS#: [id number]  Ihor Gully, LCSW

## 2017-05-26 NOTE — Progress Notes (Signed)
Subjective: He says he feels okay.  Not quite as much back pain.  He is not having any nausea or vomiting now.  Objective: Vital signs in last 24 hours: Temp:  [98.4 F (36.9 C)-98.9 F (37.2 C)] 98.5 F (36.9 C) (05/20 0458) Pulse Rate:  [55-62] 62 (05/20 0458) Resp:  [15-18] 15 (05/20 0458) BP: (120-128)/(55-71) 120/71 (05/20 0458) SpO2:  [91 %-97 %] 97 % (05/20 0458) Weight change:  Last BM Date: 05/23/17  Intake/Output from previous day: 05/19 0701 - 05/20 0700 In: -  Out: 700 [Urine:700]  PHYSICAL EXAM General appearance: alert, cooperative and no distress Resp: rhonchi bilaterally Cardio: regular rate and rhythm, S1, S2 normal, no murmur, click, rub or gallop GI: soft, non-tender; bowel sounds normal; no masses,  no organomegaly Extremities: extremities normal, atraumatic, no cyanosis or edema  Lab Results:  No results found for this or any previous visit (from the past 48 hour(s)).  ABGS No results for input(s): PHART, PO2ART, TCO2, HCO3 in the last 72 hours.  Invalid input(s): PCO2 CULTURES No results found for this or any previous visit (from the past 240 hour(s)). Studies/Results: No results found.  Medications:  Prior to Admission:  Medications Prior to Admission  Medication Sig Dispense Refill Last Dose  . albuterol (PROVENTIL HFA) 108 (90 BASE) MCG/ACT inhaler Inhale 2 puffs into the lungs every 6 (six) hours as needed for wheezing or shortness of breath.   05/22/2017 at Unknown time  . ALPRAZolam (XANAX) 0.5 MG tablet Take 1 tablet (0.5 mg total) by mouth 4 (four) times daily as needed for anxiety. (Patient taking differently: Take 1 mg by mouth 4 (four) times daily as needed for anxiety. ) 60 tablet 5 05/23/2017 at Unknown time  . aspirin EC 81 MG tablet Take 81 mg by mouth daily.   05/22/2017 at Unknown time  . divalproex (DEPAKOTE) 250 MG DR tablet Take 3 tablets (750 mg total) by mouth 3 (three) times daily. (Patient taking differently: Take 750 mg by  mouth at bedtime. ) 180 tablet 2 05/22/2017 at Unknown time  . midodrine (PROAMATINE) 5 MG tablet Take 1 tablet (5 mg total) by mouth 3 (three) times daily with meals.   05/23/2017 at Unknown time  . pantoprazole (PROTONIX) 40 MG tablet Take 40 mg by mouth daily.   05/23/2017 at Unknown time  . levETIRAcetam (KEPPRA) 500 MG tablet Take 3 tablets (1,500 mg total) by mouth 2 (two) times daily. (Patient not taking: Reported on 05/23/2017) 180 tablet 3 Not Taking at Unknown time  . nitroGLYCERIN (NITROSTAT) 0.4 MG SL tablet Place 0.4 mg under the tongue every 5 (five) minutes as needed for chest pain.    unknown   Scheduled: . aspirin EC  81 mg Oral Daily  . divalproex  750 mg Oral QHS  . enoxaparin (LOVENOX) injection  40 mg Subcutaneous Q24H  . ibuprofen  600 mg Oral Q6H  . levETIRAcetam  500 mg Oral BID  . midodrine  5 mg Oral TID WC  . nicotine  21 mg Transdermal Daily  . pantoprazole  40 mg Oral Daily   Continuous:  CXK:GYJEHUDJSHFWY **OR** acetaminophen, albuterol, ALPRAZolam, HYDROcodone-acetaminophen, ondansetron **OR** ondansetron (ZOFRAN) IV  Assesment: He has acute on chronic lumbar compression fracture.  He has significant weakness.  It has been recommended that he go to skilled care facility for rehabilitation.  He has hypertension which is well controlled.  He has seizure disorder and I have restarted his Keppra  He has COPD which is  stable he is still smoking cigarettes and he is on a patch  He has bipolar disease at baseline stable Active Problems:   Hyperlipidemia   Essential hypertension   Seizure disorder (HCC)   COPD (chronic obstructive pulmonary disease) (HCC)   Atrial fibrillation (HCC)   Lumbar compression fracture, closed, initial encounter ([place])    Plan: Continue current treatments    LOS: 0 days   Zuma Hust L 05/26/2017, 8:56 AM

## 2017-05-27 DIAGNOSIS — A419 Sepsis, unspecified organism: Secondary | ICD-10-CM | POA: Diagnosis not present

## 2017-05-27 DIAGNOSIS — S32000A Wedge compression fracture of unspecified lumbar vertebra, initial encounter for closed fracture: Secondary | ICD-10-CM | POA: Diagnosis not present

## 2017-05-27 DIAGNOSIS — J449 Chronic obstructive pulmonary disease, unspecified: Secondary | ICD-10-CM | POA: Diagnosis not present

## 2017-05-27 LAB — MRSA PCR SCREENING: MRSA by PCR: NEGATIVE

## 2017-05-27 MED ORDER — ONDANSETRON HCL 4 MG PO TABS: 4.0000 mg | ORAL_TABLET | Freq: Four times a day (QID) | ORAL | 0 refills | Status: AC | PRN

## 2017-05-27 MED ORDER — NICOTINE 21 MG/24HR TD PT24: 21.0000 mg | MEDICATED_PATCH | Freq: Every day | TRANSDERMAL | 0 refills | Status: AC

## 2017-05-27 MED ORDER — ALENDRONATE SODIUM 70 MG PO TABS: 70.0000 mg | ORAL_TABLET | ORAL | 11 refills | Status: AC

## 2017-05-27 MED ORDER — ALPRAZOLAM 0.5 MG PO TABS: 0.5000 mg | ORAL_TABLET | Freq: Four times a day (QID) | ORAL | 5 refills | Status: AC | PRN

## 2017-05-27 MED ORDER — MIDODRINE HCL 5 MG PO TABS: 5.0000 mg | ORAL_TABLET | Freq: Three times a day (TID) | ORAL | Status: AC

## 2017-05-27 MED ORDER — ACETAMINOPHEN 325 MG PO TABS: 650.0000 mg | ORAL_TABLET | Freq: Four times a day (QID) | ORAL | Status: AC | PRN

## 2017-05-27 MED ORDER — HYDROCODONE-ACETAMINOPHEN 5-325 MG PO TABS: 1.0000 | ORAL_TABLET | ORAL | 0 refills | Status: AC | PRN

## 2017-05-27 MED ORDER — ALBUTEROL SULFATE (2.5 MG/3ML) 0.083% IN NEBU: 3.0000 mL | INHALATION_SOLUTION | Freq: Four times a day (QID) | RESPIRATORY_TRACT | 12 refills | Status: AC | PRN

## 2017-05-27 MED ORDER — LEVETIRACETAM 500 MG PO TABS: 500.0000 mg | ORAL_TABLET | Freq: Two times a day (BID) | ORAL | Status: AC

## 2017-05-27 MED ORDER — DIVALPROEX SODIUM 250 MG PO DR TAB: 750.0000 mg | DELAYED_RELEASE_TABLET | Freq: Every day | ORAL | Status: AC

## 2017-05-27 MED ORDER — IBUPROFEN 800 MG PO TABS: 800.0000 mg | ORAL_TABLET | Freq: Three times a day (TID) | ORAL | 0 refills | Status: AC | PRN

## 2017-05-27 NOTE — Discharge Summary (Signed)
Physician Discharge Summary  Patient ID: LAKSH HINNERS MRN: [id number] DOB/AGE: 07/12/1945 72 y.o. Primary Care Physician:Mikalah Skyles, Percell Miller, MD Admit date: 05/23/2017 Discharge date: 05/27/2017    Discharge Diagnoses:   Active Problems:   Hyperlipidemia   Essential hypertension   Bipolar 1 disorder, mixed, moderate (HCC)   Seizure disorder (HCC)   COPD (chronic obstructive pulmonary disease) (HCC)   Atrial fibrillation (HCC)   Hemiparesis affecting left side as late effect of cerebrovascular accident (CVA) ([place])   Lumbar compression fracture, closed, initial encounter ([place])   Allergies as of 05/27/2017      Reactions   Meperidine Hcl Nausea And Vomiting      Medication List    STOP taking these medications   PROVENTIL HFA 108 (90 Base) MCG/ACT inhaler Generic drug:  albuterol Replaced by:  albuterol (2.5 MG/3ML) 0.083% nebulizer solution     TAKE these medications   acetaminophen 325 MG tablet Commonly known as:  TYLENOL Take 2 tablets (650 mg total) by mouth every 6 (six) hours as needed for mild pain (or Fever >/= 101).   albuterol (2.5 MG/3ML) 0.083% nebulizer solution Commonly known as:  PROVENTIL Inhale 3 mLs into the lungs every 6 (six) hours as needed for wheezing or shortness of breath. Replaces:  PROVENTIL HFA 108 (90 Base) MCG/ACT inhaler   alendronate 70 MG tablet Commonly known as:  FOSAMAX Take 1 tablet (70 mg total) by mouth every 7 (seven) days. Take with a full glass of water on an empty stomach.   ALPRAZolam 0.5 MG tablet Commonly known as:  XANAX Take 1 tablet (0.5 mg total) by mouth 4 (four) times daily as needed for anxiety. What changed:  how much to take   aspirin EC 81 MG tablet Take 81 mg by mouth daily.   divalproex 250 MG DR tablet Commonly known as:  DEPAKOTE Take 3 tablets (750 mg total) by mouth at bedtime.   HYDROcodone-acetaminophen 5-325 MG tablet Commonly known as:  NORCO/VICODIN Take 1-2 tablets by mouth every 4 (four)  hours as needed for moderate pain.   ibuprofen 800 MG tablet Commonly known as:  ADVIL,MOTRIN Take 1 tablet (800 mg total) by mouth every 8 (eight) hours as needed.   levETIRAcetam 500 MG tablet Commonly known as:  KEPPRA Take 1 tablet (500 mg total) by mouth 2 (two) times daily. What changed:  how much to take   midodrine 5 MG tablet Commonly known as:  PROAMATINE Take 1 tablet (5 mg total) by mouth 3 (three) times daily with meals.   nicotine 21 mg/24hr patch Commonly known as:  NICODERM CQ - dosed in mg/24 hours Place 1 patch (21 mg total) onto the skin daily. Start taking on:  05/28/2017   nitroGLYCERIN 0.4 MG SL tablet Commonly known as:  NITROSTAT Place 0.4 mg under the tongue every 5 (five) minutes as needed for chest pain.   ondansetron 4 MG tablet Commonly known as:  ZOFRAN Take 1 tablet (4 mg total) by mouth every 6 (six) hours as needed for nausea.   pantoprazole 40 MG tablet Commonly known as:  PROTONIX Take 40 mg by mouth daily.       Discharged Condition: Improved    Consults: None  Significant Diagnostic Studies: Dg Chest 1 View  Result Date: 05/23/2017 CLINICAL DATA:  Decreased O2 sats, status post fall yesterday, found down. Low back pain and bilateral hip pain. EXAM: CHEST  1 VIEW COMPARISON:  Chest x-rays dated 07/03/2016 and 01/03/2016. FINDINGS: Stable cardiomegaly. Overall cardiomediastinal  silhouette appears stable in size and configuration. Lungs are clear. No pleural effusion or pneumothorax seen. Osseous structures about the chest are unremarkable. IMPRESSION: No active disease.  Stable cardiomegaly. Electronically Signed   By: Franki Cabot M.D.   On: 05/23/2017 17:27   Dg Lumbar Spine Complete  Result Date: 05/23/2017 CLINICAL DATA:  Decreased O2 sats, Lower back pain, bilateral hip pain. Pt tripped and fell yesterday, laid in the floor for a couple hours until found. Pt stated chronic lower back pain with known bulging disc, left sided  deficits from prior stroke. EXAM: LUMBAR SPINE - COMPLETE 4+ VIEW COMPARISON:  Plain film of the lumbar spine dated 01/05/2015. FINDINGS: Stable scoliosis. No evidence of acute vertebral body subluxation. No acute appearing fracture line or displaced fracture fragment. Chronic appearing compression fracture deformities are seen at each level of the lumbar spine, all progressed compared to the previous plain film examination, most significant compression deformity at the L2 vertebral body level where there is now approximately 75% compression anteriorly. Atherosclerotic calcifications seen along the walls of the infrarenal abdominal aorta. Visualized paravertebral soft tissues are otherwise unremarkable. IMPRESSION: 1. No acute appearing osseous fracture or dislocation seen. Stable scoliosis. 2. Compression fracture deformities at each level of the lumbar spine, each of indeterminate age but each most likely chronic based on appearance. I cannot, however, exclude acute on chronic compression fracture at any level given the history of back pain and recent trauma. Consider more definitive characterization with lumbar spine MRI if clinically needed. 3. Aortic atherosclerosis. Electronically Signed   By: Franki Cabot M.D.   On: 05/23/2017 17:34   Dg Hips Bilat W Or Wo Pelvis 3-4 Views  Result Date: 05/23/2017 CLINICAL DATA:  Decreased O2 sats, Lower back pain, bilateral hip pain. Pt tripped and fell yesterday, laid in the floor for a couple hours until found. Pt stated chronic lower back pain with known bulging disc, No prior history of hip injury. EXAM: DG HIP (WITH OR WITHOUT PELVIS) 3-4V BILAT COMPARISON:  None. FINDINGS: Single view of the pelvis and two views of each hip are provided. Osseous alignment is normal. No fracture line or displaced fracture fragment seen. No significant degenerative change at either hip joint. Soft tissues about the pelvis and hips are unremarkable. IMPRESSION: Negative.  Electronically Signed   By: Franki Cabot M.D.   On: 05/23/2017 17:29    Lab Results: Basic Metabolic Panel: No results for input(s): NA, K, CL, CO2, GLUCOSE, BUN, CREATININE, CALCIUM, MG, PHOS in the last 72 hours. Liver Function Tests: No results for input(s): AST, ALT, ALKPHOS, BILITOT, PROT, ALBUMIN in the last 72 hours.   CBC: No results for input(s): WBC, NEUTROABS, HGB, HCT, MCV, PLT in the last 72 hours.  No results found for this or any previous visit (from the past 240 hour(s)).   Hospital Course: This is a 72 year old who fell at home and struck his back.  He came to the emergency department and he was noted to have a compression fracture in the lumbar spine.  This appeared to be acute on chronic.  I discussed possibility of kyphoplasty with him and he did not want to pursue that.  He had PT evaluation and it was felt that he needed to go back to a skilled care facility and that has been arranged.  He has a seizure disorder and had stopped taking his medication for seizures and that has been restarted  Discharge Exam: Blood pressure (!) 145/62, pulse 68, temperature 98.4  F (36.9 C), temperature source Oral, resp. rate 18, height 5\' 8"  (1.727 m), weight 92.9 kg (204 lb 12.9 oz), SpO2 92 %. He is awake and alert.  Chest is clear.  Heart is regular.  Disposition: To skilled care facility for PT/OT speech if needed.  He has a brace to use.    Contact information for after-discharge care    [facility] .   Service:  Skilled Nursing Contact information: [address] [place] [phone]              Signed: Alonza Bogus   05/27/2017, 9:07 AM

## 2017-05-27 NOTE — Progress Notes (Signed)
Physical Therapy Treatment Patient Details Name: Jeffrey Sweeney MRN: [id number] DOB: January 12, 1945 Today's Date: 05/27/2017    History of Present Illness Jeffrey Sweeney is a 72 y.o. male with a history of COPD, coronary artery disease, hypertension, seizure disorder, history of stroke with residual left arm weakness, history of atrial fibrillation after syncope and rib fracture in 2013.  The patient is currently in sinus rhythm.  Patient sustained a fall yesterday while at home: He was ambulating with his 4 wheeled walker to his son in the room and he was turning around when he fell and hit his back on a dresser.  He had back pain immediately, which did not improve overnight.  He came to the hospital for evaluation.  Pain is worsened with movement and improved with rest.  Pain is located in the upper lumbar region and does not radiate.  Pain is quite severe, especially with movement.    PT Comments    Patient able to ambulate 30 feet with RW and min assist to maintain balance with his quick and involuntary Lt extremity movements. Min assist for bed mobility; min assist for transfers largely for balance. Patient requires moderate assistance to don TLSO in supine correctly and will need more education on this. Pt admitted with above diagnosis. Pt currently with functional limitations due to the deficits listed below (see PT Problem List).  Pt will benefit from skilled PT to increase their independence and safety with mobility to allow discharge to the venue listed below.      Follow Up Recommendations  SNF;Supervision/Assistance - 24 hour     Equipment Recommendations  None recommended by PT    Recommendations for Other Services       Precautions / Restrictions Precautions Precautions: Fall Restrictions Weight Bearing Restrictions: No    Mobility  Bed Mobility Overal bed mobility: Needs Assistance Bed Mobility: Rolling;Sidelying to Sit;Sit to Sidelying Rolling: Supervision Sidelying  to sit: Min assist     Sit to sidelying: Min assist General bed mobility comments: flexion synergy of Lt UE and extensor synergy in Lt LE make bed mobility difficult.  Transfers Overall transfer level: Needs assistance Equipment used: Rolling walker (2 wheeled) Transfers: Sit to/from [facility] Sit to Stand: Min guard Stand pivot transfers: Min assist       General transfer comment: very unsteady on feet, requires verbal/tactile cueing for safety due to lifting RW with left UE synergy movement when standing  Ambulation/Gait Ambulation/Gait assistance: Min assist Ambulation Distance (Feet): 30 Feet Assistive device: Rolling walker (2 wheeled) Gait Pattern/deviations: Decreased step length - right;Decreased step length - left;Decreased stance time - left;Decreased stride length;Decreased weight shift to left;Staggering right Gait velocity: decreased but quick body movements due to tonal movements   General Gait Details: Lt LE remains in extension through all phases of gait; quite unsteady, quick movements, min assist largely to maintain balance/safety but he is able to bear all his own weight   Stairs             Wheelchair Mobility    Modified Rankin (Stroke Patients Only)       Balance Overall balance assessment: Needs assistance Sitting-balance support: Feet supported;No upper extremity supported Sitting balance-Leahy Scale: Good Sitting balance - Comments: Lt LE will not maintain foot on floor due to tone   Standing balance support: Bilateral upper extremity supported;During functional activity Standing balance-Leahy Scale: Poor Standing balance comment: poor with RW  Cognition Arousal/Alertness: Awake/alert Behavior During Therapy: WFL for tasks assessed/performed Overall Cognitive Status: Within Functional Limits for tasks assessed                                        Exercises       General Comments        Pertinent Vitals/Pain Pain Assessment: No/denies pain    Home Living                      Prior Function            PT Goals (current goals can now be found in the care plan section) Progress towards PT goals: Progressing toward goals    Frequency    Min 3X/week      PT Plan Current plan remains appropriate    Co-evaluation              AM-PAC PT "6 Clicks" Daily Activity  Outcome Measure  Difficulty turning over in bed (including adjusting bedclothes, sheets and blankets)?: A Little Difficulty moving from lying on back to sitting on the side of the bed? : A Little Difficulty sitting down on and standing up from a chair with arms (e.g., wheelchair, bedside commode, etc,.)?: A Little Help needed moving to and from a bed to chair (including a wheelchair)?: A Little Help needed walking in hospital room?: A Little Help needed climbing 3-5 steps with a railing? : A Lot 6 Click Score: 17    End of Session Equipment Utilized During Treatment: Gait belt Activity Tolerance: Patient tolerated treatment well;Patient limited by fatigue Patient left: with call bell/phone within reach;in bed Nurse Communication: Mobility status PT Visit Diagnosis: Unsteadiness on feet (R26.81);Other abnormalities of gait and mobility (R26.89);Muscle weakness (generalized) (M62.81)     Time: 1300-1330 PT Time Calculation (min) (ACUTE ONLY): 30 min  Charges:  $Gait Training: 8-22 mins $Therapeutic Activity: 8-22 mins                    G Codes:       Asmar Brozek D. Hartnett-Rands, MS, PT Per [place] [phone] 05/27/2017, 1:42 PM

## 2017-05-27 NOTE — Progress Notes (Signed)
Subjective: He says he feels better.  His breathing is okay.  He still has back pain.  We discussed having kyphoplasty procedure and he really is not interested.  Objective: Vital signs in last 24 hours: Temp:  [98.3 F (36.8 C)-98.4 F (36.9 C)] 98.4 F (36.9 C) (05/21 0545) Pulse Rate:  [66-100] 68 (05/21 0545) Resp:  [18] 18 (05/21 0545) BP: (125-145)/(62-71) 145/62 (05/21 0545) SpO2:  [91 %-93 %] 92 % (05/21 0545) Weight change:  Last BM Date: 05/23/17  Intake/Output from previous day: 05/20 0701 - 05/21 0700 In: 960 [P.O.:960] Out: 950 [Urine:950]  PHYSICAL EXAM General appearance: alert, cooperative and no distress Resp: clear to auscultation bilaterally Cardio: regular rate and rhythm, S1, S2 normal, no murmur, click, rub or gallop GI: soft, non-tender; bowel sounds normal; no masses,  no organomegaly Extremities: extremities normal, atraumatic, no cyanosis or edema  Lab Results:  No results found for this or any previous visit (from the past 48 hour(s)).  ABGS No results for input(s): PHART, PO2ART, TCO2, HCO3 in the last 72 hours.  Invalid input(s): PCO2 CULTURES No results found for this or any previous visit (from the past 240 hour(s)). Studies/Results: No results found.  Medications:  Prior to Admission:  Medications Prior to Admission  Medication Sig Dispense Refill Last Dose  . albuterol (PROVENTIL HFA) 108 (90 BASE) MCG/ACT inhaler Inhale 2 puffs into the lungs every 6 (six) hours as needed for wheezing or shortness of breath.   05/22/2017 at Unknown time  . ALPRAZolam (XANAX) 0.5 MG tablet Take 1 tablet (0.5 mg total) by mouth 4 (four) times daily as needed for anxiety. (Patient taking differently: Take 1 mg by mouth 4 (four) times daily as needed for anxiety. ) 60 tablet 5 05/23/2017 at Unknown time  . aspirin EC 81 MG tablet Take 81 mg by mouth daily.   05/22/2017 at Unknown time  . divalproex (DEPAKOTE) 250 MG DR tablet Take 3 tablets (750 mg total) by  mouth 3 (three) times daily. (Patient taking differently: Take 750 mg by mouth at bedtime. ) 180 tablet 2 05/22/2017 at Unknown time  . midodrine (PROAMATINE) 5 MG tablet Take 1 tablet (5 mg total) by mouth 3 (three) times daily with meals.   05/23/2017 at Unknown time  . pantoprazole (PROTONIX) 40 MG tablet Take 40 mg by mouth daily.   05/23/2017 at Unknown time  . levETIRAcetam (KEPPRA) 500 MG tablet Take 3 tablets (1,500 mg total) by mouth 2 (two) times daily. (Patient not taking: Reported on 05/23/2017) 180 tablet 3 Not Taking at Unknown time  . nitroGLYCERIN (NITROSTAT) 0.4 MG SL tablet Place 0.4 mg under the tongue every 5 (five) minutes as needed for chest pain.    unknown   Scheduled: . aspirin EC  81 mg Oral Daily  . divalproex  750 mg Oral QHS  . enoxaparin (LOVENOX) injection  40 mg Subcutaneous Q24H  . ibuprofen  600 mg Oral Q6H  . levETIRAcetam  500 mg Oral BID  . midodrine  5 mg Oral TID WC  . nicotine  21 mg Transdermal Daily  . pantoprazole  40 mg Oral Daily   Continuous:  [id number] **OR** acetaminophen, albuterol, ALPRAZolam, HYDROcodone-acetaminophen, ondansetron **OR** ondansetron (ZOFRAN) IV  Assesment: He has lumbar compression fracture.  Plans are for him to go to skilled care facility.  He does not want kyphoplasty.  He has COPD which is stable  He has seizure disorder which is stable  He has bipolar disease which is  stable on medication Active Problems:   Hyperlipidemia   Essential hypertension   Seizure disorder (HCC)   COPD (chronic obstructive pulmonary disease) (HCC)   Atrial fibrillation (HCC)   Lumbar compression fracture, closed, initial encounter ([place])    Plan: For transfer to skilled care facility when bed available    LOS: 0 days   Salisha Bardsley L 05/27/2017, 8:56 AM

## 2017-05-28 DIAGNOSIS — R4182 Altered mental status, unspecified: Secondary | ICD-10-CM | POA: Diagnosis not present

## 2017-05-28 DIAGNOSIS — J449 Chronic obstructive pulmonary disease, unspecified: Secondary | ICD-10-CM | POA: Diagnosis not present

## 2017-05-28 DIAGNOSIS — A419 Sepsis, unspecified organism: Secondary | ICD-10-CM | POA: Diagnosis not present

## 2017-05-28 DIAGNOSIS — Z79899 Other long term (current) drug therapy: Secondary | ICD-10-CM | POA: Diagnosis not present

## 2017-05-28 DIAGNOSIS — M6281 Muscle weakness (generalized): Secondary | ICD-10-CM | POA: Diagnosis not present

## 2017-05-28 DIAGNOSIS — M545 Low back pain: Secondary | ICD-10-CM | POA: Diagnosis not present

## 2017-05-28 DIAGNOSIS — I69354 Hemiplegia and hemiparesis following cerebral infarction affecting left non-dominant side: Secondary | ICD-10-CM | POA: Diagnosis not present

## 2017-05-28 DIAGNOSIS — I69352 Hemiplegia and hemiparesis following cerebral infarction affecting left dominant side: Secondary | ICD-10-CM | POA: Diagnosis not present

## 2017-05-28 DIAGNOSIS — I2510 Atherosclerotic heart disease of native coronary artery without angina pectoris: Secondary | ICD-10-CM | POA: Diagnosis not present

## 2017-05-28 DIAGNOSIS — R279 Unspecified lack of coordination: Secondary | ICD-10-CM | POA: Diagnosis not present

## 2017-05-28 DIAGNOSIS — I700 Atherosclerosis of aorta: Secondary | ICD-10-CM | POA: Diagnosis not present

## 2017-05-28 DIAGNOSIS — M8000XD Age-related osteoporosis with current pathological fracture, unspecified site, subsequent encounter for fracture with routine healing: Secondary | ICD-10-CM | POA: Diagnosis not present

## 2017-05-28 DIAGNOSIS — K210 Gastro-esophageal reflux disease with esophagitis: Secondary | ICD-10-CM | POA: Diagnosis not present

## 2017-05-28 DIAGNOSIS — E7800 Pure hypercholesterolemia, unspecified: Secondary | ICD-10-CM | POA: Diagnosis not present

## 2017-05-28 DIAGNOSIS — G40901 Epilepsy, unspecified, not intractable, with status epilepticus: Secondary | ICD-10-CM | POA: Diagnosis not present

## 2017-05-28 DIAGNOSIS — R2681 Unsteadiness on feet: Secondary | ICD-10-CM | POA: Diagnosis not present

## 2017-05-28 DIAGNOSIS — I480 Paroxysmal atrial fibrillation: Secondary | ICD-10-CM | POA: Diagnosis not present

## 2017-05-28 DIAGNOSIS — Z8673 Personal history of transient ischemic attack (TIA), and cerebral infarction without residual deficits: Secondary | ICD-10-CM | POA: Diagnosis not present

## 2017-05-28 DIAGNOSIS — I4891 Unspecified atrial fibrillation: Secondary | ICD-10-CM | POA: Diagnosis not present

## 2017-05-28 DIAGNOSIS — Z7401 Bed confinement status: Secondary | ICD-10-CM | POA: Diagnosis not present

## 2017-05-28 DIAGNOSIS — F17210 Nicotine dependence, cigarettes, uncomplicated: Secondary | ICD-10-CM | POA: Diagnosis not present

## 2017-05-28 DIAGNOSIS — M4856XD Collapsed vertebra, not elsewhere classified, lumbar region, subsequent encounter for fracture with routine healing: Secondary | ICD-10-CM | POA: Diagnosis not present

## 2017-05-28 DIAGNOSIS — F419 Anxiety disorder, unspecified: Secondary | ICD-10-CM | POA: Diagnosis not present

## 2017-05-28 DIAGNOSIS — I10 Essential (primary) hypertension: Secondary | ICD-10-CM | POA: Diagnosis not present

## 2017-05-28 DIAGNOSIS — Z7982 Long term (current) use of aspirin: Secondary | ICD-10-CM | POA: Diagnosis not present

## 2017-05-28 DIAGNOSIS — K219 Gastro-esophageal reflux disease without esophagitis: Secondary | ICD-10-CM | POA: Diagnosis not present

## 2017-05-28 DIAGNOSIS — J441 Chronic obstructive pulmonary disease with (acute) exacerbation: Secondary | ICD-10-CM | POA: Diagnosis not present

## 2017-05-28 DIAGNOSIS — R0902 Hypoxemia: Secondary | ICD-10-CM | POA: Diagnosis not present

## 2017-05-28 DIAGNOSIS — I472 Ventricular tachycardia: Secondary | ICD-10-CM | POA: Diagnosis not present

## 2017-05-28 DIAGNOSIS — S32000A Wedge compression fracture of unspecified lumbar vertebra, initial encounter for closed fracture: Secondary | ICD-10-CM | POA: Diagnosis not present

## 2017-05-28 DIAGNOSIS — R5381 Other malaise: Secondary | ICD-10-CM | POA: Diagnosis not present

## 2017-05-28 DIAGNOSIS — R569 Unspecified convulsions: Secondary | ICD-10-CM | POA: Diagnosis not present

## 2017-05-28 DIAGNOSIS — G40909 Epilepsy, unspecified, not intractable, without status epilepticus: Secondary | ICD-10-CM | POA: Diagnosis not present

## 2017-05-28 DIAGNOSIS — R7989 Other specified abnormal findings of blood chemistry: Secondary | ICD-10-CM | POA: Diagnosis not present

## 2017-05-28 DIAGNOSIS — G40209 Localization-related (focal) (partial) symptomatic epilepsy and epileptic syndromes with complex partial seizures, not intractable, without status epilepticus: Secondary | ICD-10-CM | POA: Diagnosis not present

## 2017-05-28 NOTE — Clinical Social Work Placement (Signed)
   CLINICAL SOCIAL WORK PLACEMENT  NOTE  Date:  05/28/2017  Patient Details  Name: Jeffrey Sweeney MRN: [id number] Date of Birth: Apr 19, 1945  Clinical Social Work is seeking post-discharge placement for this patient at the [place] level of care (*[name] will initial, date and re-position this form in  chart as items are completed):  Yes   Patient/family provided with [place] Work Department's list of facilities offering this level of care within the geographic area requested by the patient (or if unable, by the patient's family).  Yes   Patient/family informed of their freedom to choose among providers that offer the needed level of care, that participate in Medicare, Medicaid or managed care program needed by the patient, have an available bed and are willing to accept the patient.  Yes   Patient/family informed of [facility]'s ownership interest in [facility] and [facility], as well as of the fact that they are under no obligation to receive care at these facilities.  PASRR submitted to EDS on 05/26/17     PASRR number received on 05/27/17     Existing PASRR number confirmed on       FL2 transmitted to all facilities in geographic area requested by pt/family on 05/26/17     FL2 transmitted to all facilities within larger geographic area on       Patient informed that his/her managed care company has contracts with or will negotiate with certain facilities, including the following:        Yes   Patient/family informed of bed offers received.  Patient chooses bed at [facility]     Physician recommends and patient chooses bed at      Patient to be transferred to [facility] on 05/28/17.  Patient to be transferred to facility by [facility]     Patient family notified on 05/28/17 of transfer.  Name of family member notified:  attempted call to sister, [name], mailbox was full.      PHYSICIAN       Additional Comment:    Facility notified. Discharge clinicals sent. LCSW signing off.   _______________________________________________ Ihor Gully, LCSW 05/28/2017, 3:15 PM

## 2017-05-28 NOTE — Progress Notes (Signed)
Pt discharged via ambulance. NAD. Family speaking with pt at time of discharge via phone stated his voice "sounded funny". Assessed pt and had [facility] representative assess pt. Per both RN's pt sounded the same from previous assessments. Pt states he feels like he has "some gunk in my throat". Pt denies any abnormality. Denies needs. Pt states "I feel fine". NAD.

## 2017-05-28 NOTE — Clinical Social Work Note (Addendum)
Late entry for 05/27/17:  Patient remains hospitalized as we await for facility to receive insurance authorization.  Patient's PASARR no. Is [id number] E 05/27/17-06-26-17.     Nolen Lindamood, Clydene Pugh, LCSW

## 2017-05-28 NOTE — Progress Notes (Signed)
Subjective: He says he feels okay.  He still has back pain.  He is wearing his brace now.  No other new complaints.  No seizures.  No chest pain.  No shortness of breath  Objective: Vital signs in last 24 hours: Temp:  [97.3 F (36.3 C)-98.3 F (36.8 C)] 97.3 F (36.3 C) (05/22 0604) Pulse Rate:  [53-103] 62 (05/22 0604) Resp:  [20] 20 (05/22 0604) BP: (117-130)/(65-88) 129/88 (05/22 0604) SpO2:  [90 %-93 %] 93 % (05/22 0604) Weight change:  Last BM Date: 05/23/17  Intake/Output from previous day: 05/21 0701 - 05/22 0700 In: 480 [P.O.:480] Out: 2600 [Urine:2600]  PHYSICAL EXAM General appearance: alert, cooperative and no distress Resp: clear to auscultation bilaterally Cardio: regular rate and rhythm, S1, S2 normal, no murmur, click, rub or gallop GI: soft, non-tender; bowel sounds normal; no masses,  no organomegaly Extremities: extremities normal, atraumatic, no cyanosis or edema  Lab Results:  Results for orders placed or performed during the hospital encounter of 05/23/17 (from the past 48 hour(s))  MRSA PCR Screening     Status: None   Collection Time: 05/27/17  8:55 AM  Result Value Ref Range   MRSA by PCR NEGATIVE NEGATIVE    Comment:        The GeneXpert MRSA Assay (FDA approved for NASAL specimens only), is one component of a comprehensive MRSA colonization surveillance program. It is not intended to diagnose MRSA infection nor to guide or monitor treatment for MRSA infections. Performed at [facility], [address] [place], [place] [address]     ABGS No results for input(s): PHART, PO2ART, TCO2, HCO3 in the last 72 hours.  Invalid input(s): PCO2 CULTURES Recent Results (from the past 240 hour(s))  MRSA PCR Screening     Status: None   Collection Time: 05/27/17  8:55 AM  Result Value Ref Range Status   MRSA by PCR NEGATIVE NEGATIVE Final    Comment:        The GeneXpert MRSA Assay (FDA approved for NASAL specimens only), is one component of  a comprehensive MRSA colonization surveillance program. It is not intended to diagnose MRSA infection nor to guide or monitor treatment for MRSA infections. Performed at [facility], [address], [place], [place] [address]    Studies/Results: No results found.  Medications:  Prior to Admission:  Medications Prior to Admission  Medication Sig Dispense Refill Last Dose  . albuterol (PROVENTIL HFA) 108 (90 BASE) MCG/ACT inhaler Inhale 2 puffs into the lungs every 6 (six) hours as needed for wheezing or shortness of breath.   05/22/2017 at Unknown time  . aspirin EC 81 MG tablet Take 81 mg by mouth daily.   05/22/2017 at Unknown time  . divalproex (DEPAKOTE) 250 MG DR tablet Take 3 tablets (750 mg total) by mouth 3 (three) times daily. (Patient taking differently: Take 750 mg by mouth at bedtime. ) 180 tablet 2 05/22/2017 at Unknown time  . midodrine (PROAMATINE) 5 MG tablet Take 1 tablet (5 mg total) by mouth 3 (three) times daily with meals.   05/23/2017 at Unknown time  . pantoprazole (PROTONIX) 40 MG tablet Take 40 mg by mouth daily.   05/23/2017 at Unknown time  . [DISCONTINUED] ALPRAZolam (XANAX) 0.5 MG tablet Take 1 tablet (0.5 mg total) by mouth 4 (four) times daily as needed for anxiety. (Patient taking differently: Take 1 mg by mouth 4 (four) times daily as needed for anxiety. ) 60 tablet 5 05/23/2017 at Unknown time  .  levETIRAcetam (KEPPRA) 500 MG tablet Take 3 tablets (1,500 mg total) by mouth 2 (two) times daily. (Patient not taking: Reported on 05/23/2017) 180 tablet 3 Not Taking at Unknown time  . nitroGLYCERIN (NITROSTAT) 0.4 MG SL tablet Place 0.4 mg under the tongue every 5 (five) minutes as needed for chest pain.    unknown   Scheduled: . aspirin EC  81 mg Oral Daily  . divalproex  750 mg Oral QHS  . enoxaparin (LOVENOX) injection  40 mg Subcutaneous Q24H  . ibuprofen  600 mg Oral Q6H  . levETIRAcetam  500 mg Oral BID  . midodrine  5 mg Oral TID WC  . nicotine  21 mg  Transdermal Daily  . pantoprazole  40 mg Oral Daily   Continuous:  [id number] **OR** acetaminophen, albuterol, ALPRAZolam, HYDROcodone-acetaminophen, ondansetron **OR** ondansetron (ZOFRAN) IV  Assesment: He was admitted with compression fracture of the lumbar spine.  He is improving and he is using a TLSO brace.  Plans are for him to go to skilled care facility for rehabilitation.  He has presumed osteoporosis and will have bone density as an outpatient.  I have started him on alendronate  He has COPD with ongoing cigarette smoking and he is on a nicotine patch.  He is not complaining of shortness of breath  He has coronary disease and has nitroglycerin available but he is not having any chest pain  He had previous stroke that is left him with left hemiparesis stable  He has bipolar disease and he is on divalproex for that.  This seems about the same  He has seizure disorder and somehow had taken himself off his Keppra but he is back on it now with no seizures  He has had significant trouble with orthostatic hypotension and he is on midodrine for that Active Problems:   Hyperlipidemia   Essential hypertension   Bipolar 1 disorder, mixed, moderate (HCC)   Seizure disorder (HCC)   COPD (chronic obstructive pulmonary disease) (HCC)   Atrial fibrillation ([place])   Hemiparesis affecting left side as late effect of cerebrovascular accident (CVA) ([place])   Lumbar compression fracture, closed, initial encounter ([place])    Plan: Continue treatments.  For transfer to skilled care facility when approved    LOS: 0 days   Chairty Toman L 05/28/2017, 8:17 AM

## 2017-05-28 NOTE — Plan of Care (Signed)
Pt at risk for falls. Bed alarm on. Pt aware of need to call Rn/tech before getting up. Pt able to return demonstrate how to use call bell.

## 2017-05-28 NOTE — Progress Notes (Signed)
Attempted x5 to call report. No answer. Spoke to [place] at [facility], placed on hold then asked to call back. Provided nurse with phone number to call back when she was ready for report. Awaiting call back at this time.

## 2017-05-28 NOTE — Progress Notes (Signed)
Called report to [facility] at [facility]. Questions asked and answered. Pt ready to go. NAD.

## 2017-06-01 DIAGNOSIS — M8000XD Age-related osteoporosis with current pathological fracture, unspecified site, subsequent encounter for fracture with routine healing: Secondary | ICD-10-CM | POA: Diagnosis not present

## 2017-06-01 DIAGNOSIS — I4891 Unspecified atrial fibrillation: Secondary | ICD-10-CM | POA: Diagnosis not present

## 2017-06-01 DIAGNOSIS — M4856XD Collapsed vertebra, not elsewhere classified, lumbar region, subsequent encounter for fracture with routine healing: Secondary | ICD-10-CM | POA: Diagnosis not present

## 2017-06-01 DIAGNOSIS — G40909 Epilepsy, unspecified, not intractable, without status epilepticus: Secondary | ICD-10-CM | POA: Diagnosis not present

## 2017-06-02 DIAGNOSIS — R1312 Dysphagia, oropharyngeal phase: Secondary | ICD-10-CM | POA: Diagnosis not present

## 2017-06-02 DIAGNOSIS — J449 Chronic obstructive pulmonary disease, unspecified: Secondary | ICD-10-CM | POA: Diagnosis not present

## 2017-06-02 DIAGNOSIS — M6281 Muscle weakness (generalized): Secondary | ICD-10-CM | POA: Diagnosis not present

## 2017-06-02 DIAGNOSIS — I69354 Hemiplegia and hemiparesis following cerebral infarction affecting left non-dominant side: Secondary | ICD-10-CM | POA: Diagnosis not present

## 2017-06-02 DIAGNOSIS — F419 Anxiety disorder, unspecified: Secondary | ICD-10-CM | POA: Diagnosis not present

## 2017-06-02 DIAGNOSIS — R279 Unspecified lack of coordination: Secondary | ICD-10-CM | POA: Diagnosis not present

## 2017-06-02 DIAGNOSIS — I4891 Unspecified atrial fibrillation: Secondary | ICD-10-CM | POA: Diagnosis not present

## 2017-06-02 DIAGNOSIS — Z7982 Long term (current) use of aspirin: Secondary | ICD-10-CM | POA: Diagnosis not present

## 2017-06-02 DIAGNOSIS — G40901 Epilepsy, unspecified, not intractable, with status epilepticus: Secondary | ICD-10-CM | POA: Diagnosis not present

## 2017-06-02 DIAGNOSIS — M4856XD Collapsed vertebra, not elsewhere classified, lumbar region, subsequent encounter for fracture with routine healing: Secondary | ICD-10-CM | POA: Diagnosis not present

## 2017-06-02 DIAGNOSIS — G40209 Localization-related (focal) (partial) symptomatic epilepsy and epileptic syndromes with complex partial seizures, not intractable, without status epilepticus: Secondary | ICD-10-CM | POA: Diagnosis not present

## 2017-06-02 DIAGNOSIS — R569 Unspecified convulsions: Secondary | ICD-10-CM | POA: Diagnosis not present

## 2017-06-02 DIAGNOSIS — M8000XD Age-related osteoporosis with current pathological fracture, unspecified site, subsequent encounter for fracture with routine healing: Secondary | ICD-10-CM | POA: Diagnosis not present

## 2017-06-02 DIAGNOSIS — I69352 Hemiplegia and hemiparesis following cerebral infarction affecting left dominant side: Secondary | ICD-10-CM | POA: Diagnosis not present

## 2017-06-02 DIAGNOSIS — K210 Gastro-esophageal reflux disease with esophagitis: Secondary | ICD-10-CM | POA: Diagnosis not present

## 2017-06-02 DIAGNOSIS — R0902 Hypoxemia: Secondary | ICD-10-CM | POA: Diagnosis not present

## 2017-06-02 DIAGNOSIS — R269 Unspecified abnormalities of gait and mobility: Secondary | ICD-10-CM | POA: Diagnosis not present

## 2017-06-02 DIAGNOSIS — I700 Atherosclerosis of aorta: Secondary | ICD-10-CM | POA: Diagnosis not present

## 2017-06-02 DIAGNOSIS — R7989 Other specified abnormal findings of blood chemistry: Secondary | ICD-10-CM | POA: Diagnosis not present

## 2017-06-02 DIAGNOSIS — I10 Essential (primary) hypertension: Secondary | ICD-10-CM | POA: Diagnosis not present

## 2017-06-02 DIAGNOSIS — Z7401 Bed confinement status: Secondary | ICD-10-CM | POA: Diagnosis not present

## 2017-06-02 DIAGNOSIS — Z79899 Other long term (current) drug therapy: Secondary | ICD-10-CM | POA: Diagnosis not present

## 2017-06-02 DIAGNOSIS — I472 Ventricular tachycardia: Secondary | ICD-10-CM | POA: Diagnosis not present

## 2017-06-02 DIAGNOSIS — R4182 Altered mental status, unspecified: Secondary | ICD-10-CM | POA: Diagnosis not present

## 2017-06-02 DIAGNOSIS — G40909 Epilepsy, unspecified, not intractable, without status epilepticus: Secondary | ICD-10-CM | POA: Diagnosis not present

## 2017-06-02 DIAGNOSIS — R5381 Other malaise: Secondary | ICD-10-CM | POA: Diagnosis not present

## 2017-06-02 DIAGNOSIS — J441 Chronic obstructive pulmonary disease with (acute) exacerbation: Secondary | ICD-10-CM | POA: Diagnosis not present

## 2017-06-02 DIAGNOSIS — K219 Gastro-esophageal reflux disease without esophagitis: Secondary | ICD-10-CM | POA: Diagnosis not present

## 2017-06-02 DIAGNOSIS — R278 Other lack of coordination: Secondary | ICD-10-CM | POA: Diagnosis not present

## 2017-06-02 DIAGNOSIS — R41841 Cognitive communication deficit: Secondary | ICD-10-CM | POA: Diagnosis not present

## 2017-06-04 DIAGNOSIS — R41841 Cognitive communication deficit: Secondary | ICD-10-CM | POA: Diagnosis not present

## 2017-06-04 DIAGNOSIS — J449 Chronic obstructive pulmonary disease, unspecified: Secondary | ICD-10-CM | POA: Diagnosis not present

## 2017-06-04 DIAGNOSIS — M4856XD Collapsed vertebra, not elsewhere classified, lumbar region, subsequent encounter for fracture with routine healing: Secondary | ICD-10-CM | POA: Diagnosis not present

## 2017-06-04 DIAGNOSIS — I2510 Atherosclerotic heart disease of native coronary artery without angina pectoris: Secondary | ICD-10-CM | POA: Diagnosis not present

## 2017-06-04 DIAGNOSIS — M6281 Muscle weakness (generalized): Secondary | ICD-10-CM | POA: Diagnosis not present

## 2017-06-04 DIAGNOSIS — B779 Ascariasis, unspecified: Secondary | ICD-10-CM | POA: Diagnosis not present

## 2017-06-04 DIAGNOSIS — I951 Orthostatic hypotension: Secondary | ICD-10-CM | POA: Diagnosis not present

## 2017-06-04 DIAGNOSIS — R5381 Other malaise: Secondary | ICD-10-CM | POA: Diagnosis not present

## 2017-06-04 DIAGNOSIS — I69352 Hemiplegia and hemiparesis following cerebral infarction affecting left dominant side: Secondary | ICD-10-CM | POA: Diagnosis not present

## 2017-06-04 DIAGNOSIS — I4891 Unspecified atrial fibrillation: Secondary | ICD-10-CM | POA: Diagnosis not present

## 2017-06-04 DIAGNOSIS — G40901 Epilepsy, unspecified, not intractable, with status epilepticus: Secondary | ICD-10-CM | POA: Diagnosis not present

## 2017-06-04 DIAGNOSIS — R269 Unspecified abnormalities of gait and mobility: Secondary | ICD-10-CM | POA: Diagnosis not present

## 2017-06-04 DIAGNOSIS — R262 Difficulty in walking, not elsewhere classified: Secondary | ICD-10-CM | POA: Diagnosis not present

## 2017-06-04 DIAGNOSIS — R279 Unspecified lack of coordination: Secondary | ICD-10-CM | POA: Diagnosis not present

## 2017-06-04 DIAGNOSIS — S32010D Wedge compression fracture of first lumbar vertebra, subsequent encounter for fracture with routine healing: Secondary | ICD-10-CM | POA: Diagnosis not present

## 2017-06-04 DIAGNOSIS — R293 Abnormal posture: Secondary | ICD-10-CM | POA: Diagnosis not present

## 2017-06-04 DIAGNOSIS — K219 Gastro-esophageal reflux disease without esophagitis: Secondary | ICD-10-CM | POA: Diagnosis not present

## 2017-06-04 DIAGNOSIS — R55 Syncope and collapse: Secondary | ICD-10-CM | POA: Diagnosis not present

## 2017-06-04 DIAGNOSIS — I10 Essential (primary) hypertension: Secondary | ICD-10-CM | POA: Diagnosis not present

## 2017-06-04 DIAGNOSIS — J441 Chronic obstructive pulmonary disease with (acute) exacerbation: Secondary | ICD-10-CM | POA: Diagnosis not present

## 2017-06-04 DIAGNOSIS — G40909 Epilepsy, unspecified, not intractable, without status epilepticus: Secondary | ICD-10-CM | POA: Diagnosis not present

## 2017-06-04 DIAGNOSIS — Z7401 Bed confinement status: Secondary | ICD-10-CM | POA: Diagnosis not present

## 2017-06-04 DIAGNOSIS — R1312 Dysphagia, oropharyngeal phase: Secondary | ICD-10-CM | POA: Diagnosis not present

## 2017-06-04 DIAGNOSIS — I259 Chronic ischemic heart disease, unspecified: Secondary | ICD-10-CM | POA: Diagnosis not present

## 2017-06-04 DIAGNOSIS — K210 Gastro-esophageal reflux disease with esophagitis: Secondary | ICD-10-CM | POA: Diagnosis not present

## 2017-06-04 DIAGNOSIS — M8000XD Age-related osteoporosis with current pathological fracture, unspecified site, subsequent encounter for fracture with routine healing: Secondary | ICD-10-CM | POA: Diagnosis not present

## 2017-06-04 DIAGNOSIS — R278 Other lack of coordination: Secondary | ICD-10-CM | POA: Diagnosis not present

## 2017-06-04 DIAGNOSIS — R0902 Hypoxemia: Secondary | ICD-10-CM | POA: Diagnosis not present

## 2017-06-04 DIAGNOSIS — F419 Anxiety disorder, unspecified: Secondary | ICD-10-CM | POA: Diagnosis not present

## 2017-06-13 DIAGNOSIS — S32010D Wedge compression fracture of first lumbar vertebra, subsequent encounter for fracture with routine healing: Secondary | ICD-10-CM | POA: Diagnosis not present

## 2017-06-13 DIAGNOSIS — G40909 Epilepsy, unspecified, not intractable, without status epilepticus: Secondary | ICD-10-CM | POA: Diagnosis not present

## 2017-06-13 DIAGNOSIS — M8000XD Age-related osteoporosis with current pathological fracture, unspecified site, subsequent encounter for fracture with routine healing: Secondary | ICD-10-CM | POA: Diagnosis not present

## 2017-06-18 ENCOUNTER — Other Ambulatory Visit: Payer: Self-pay | Admitting: Licensed Clinical Social Worker

## 2017-06-18 NOTE — Patient Outreach (Signed)
[facility]) Care Management  [facility] Social Work  06/18/2017  Jeffrey Sweeney May 26, 1945 [id number]  Subjective:    Objective:   Encounter Medications:  Outpatient Encounter Medications as of 06/18/2017  Medication Sig  . acetaminophen (TYLENOL) 325 MG tablet Take 2 tablets (650 mg total) by mouth every 6 (six) hours as needed for mild pain (or Fever >/= 101).  Marland Kitchen albuterol (PROVENTIL) (2.5 MG/3ML) 0.083% nebulizer solution Inhale 3 mLs into the lungs every 6 (six) hours as needed for wheezing or shortness of breath.  Marland Kitchen alendronate (FOSAMAX) 70 MG tablet Take 1 tablet (70 mg total) by mouth every 7 (seven) days. Take with a full glass of water on an empty stomach.  . ALPRAZolam (XANAX) 0.5 MG tablet Take 1 tablet (0.5 mg total) by mouth 4 (four) times daily as needed for anxiety.  Marland Kitchen aspirin EC 81 MG tablet Take 81 mg by mouth daily.  . divalproex (DEPAKOTE) 250 MG DR tablet Take 3 tablets (750 mg total) by mouth at bedtime.  Marland Kitchen HYDROcodone-acetaminophen (NORCO/VICODIN) 5-325 MG tablet Take 1-2 tablets by mouth every 4 (four) hours as needed for moderate pain.  Marland Kitchen ibuprofen (ADVIL,MOTRIN) 800 MG tablet Take 1 tablet (800 mg total) by mouth every 8 (eight) hours as needed.  . levETIRAcetam (KEPPRA) 500 MG tablet Take 1 tablet (500 mg total) by mouth 2 (two) times daily.  . midodrine (PROAMATINE) 5 MG tablet Take 1 tablet (5 mg total) by mouth 3 (three) times daily with meals.  . nicotine (NICODERM CQ - DOSED IN MG/24 HOURS) 21 mg/24hr patch Place 1 patch (21 mg total) onto the skin daily.  . nitroGLYCERIN (NITROSTAT) 0.4 MG SL tablet Place 0.4 mg under the tongue every 5 (five) minutes as needed for chest pain.   Marland Kitchen ondansetron (ZOFRAN) 4 MG tablet Take 1 tablet (4 mg total) by mouth every 6 (six) hours as needed for nausea.  . pantoprazole (PROTONIX) 40 MG tablet Take 40 mg by mouth daily.   No facility-administered encounter medications on file as of 06/18/2017.      Functional Status:  In your present state of health, do you have any difficulty performing the following activities: 05/23/2017 07/04/2016  Hearing? N N  Vision? N N  Difficulty concentrating or making decisions? N Y  Walking or climbing stairs? Y Y  Dressing or bathing? Y Y  Doing errands, shopping? Y N  Some recent data might be hidden    Fall/Depression Screening:  PHQ 2/9 Scores 06/01/2015 05/25/2015 04/26/2015 03/20/2015 03/01/2015 02/06/2015 02/06/2015  PHQ - 2 Score '2 2 2 '$ 0 0 3 3  PHQ- 9 Score '7 7 7 '$ - - 8 8    Assessment:   [name] traveled to [place] on 06/18/17 to visit client. [name] met with client on 06/18/17 at therapy gym to speak with client. [name] spoke with client about his current services received. Client receives nursing care, physical therapy support. occupational therapy support, and speech therapy support.  Client was in wheelchair during visit. He was wearing a back brace during visit.  Client said he hopes to be at nursing facility a few weeks to receive care and then hopes to return home with supports in place.  [name] spoke with several representatives of therapy department regarding client status. Representative said client was walking short distance but that client was a fall risk. Client needs moderate assistance in standing.  Representative of therapy department said client has some memory recall issues.  Client reported that he has two relatives that reside with him at his home in the community.  [name] gave client [facility] [name] card and asked [name] to call [name] as needed to talk about social work needs of client. [name] thanked [name] for allowing [name] to visit him at facility on 06/18/17.  [name] also met with [name], facility social worker, on 06/18/17 and spoke with [name] about client needs and discharge plans. [name] also said that client was at risk for falls and if he went home he would need care in the home to prevent client from falling. [name]  said that discharge planning meeting had been held recently for client. She said client has [employer]. She said that if [employer] declines further nursing home coverage for client, that she thought his family would appeal this [facility] decision.  [name] thanked [place] for this information regarding client.  Plan:   [name] to call client in 2 weeks to assess discharge plans for client at that time.   [name].Tor Tsuda MSW, LCSW Licensed Clinical Social Worker [facility] Care Management [phone]

## 2017-06-24 DIAGNOSIS — B779 Ascariasis, unspecified: Secondary | ICD-10-CM | POA: Diagnosis not present

## 2017-06-27 ENCOUNTER — Other Ambulatory Visit: Payer: Self-pay | Admitting: Licensed Clinical Social Worker

## 2017-06-27 NOTE — Patient Outreach (Signed)
Assessment:  [name] called [facility] facility on 06/27/17 and spoke with representative about client status.  [name] was informed by [name], facility social worker, that client planned to [place] form that nursing facility on 06/28/17 and planned to return home after discharge from facility on 06/28/17.  Following discharge from nursing home, client is scheduled to receive home health services with [facility].  Client is scheduled to receive home health physical therapy, home health occucpational therapy, and home health  nursing services.  Client is wearing a back brace as prescribed  Client has received nursing services at facility. Client has also received physical therapy services and occupational therapy services at nursing facility. [name] Theadore Nan spoke on 06/27/17 with representative of [place] [facility]  [name] informed representative of [facility] that [name] planned to close case to my services for client on 06/27/17 and that Theadore Nan would send order for [name] to begin working with client in the home environment. Client resides in [place], [place]. [facility] representative agreed to this plan.  Of note, [facility] representative has noted that client may have some family dysfunction or disagreement occasionally in home environment.     Plan:  [name] Theadore Nan is closing his social work services to client on 06/27/17.  [name] Theadore Nan to send order for [name] to work with client in the home environment [place], [place]).  [name] Theadore Nan to send order for [facility] to work with client for Transition of Care calls.   [name].Joshiah Traynham MSW, LCSW Licensed Clinical Social Worker [facility] Care Management [phone]

## 2017-06-29 DIAGNOSIS — I4891 Unspecified atrial fibrillation: Secondary | ICD-10-CM | POA: Diagnosis not present

## 2017-06-29 DIAGNOSIS — I69354 Hemiplegia and hemiparesis following cerebral infarction affecting left non-dominant side: Secondary | ICD-10-CM | POA: Diagnosis not present

## 2017-06-29 DIAGNOSIS — I2510 Atherosclerotic heart disease of native coronary artery without angina pectoris: Secondary | ICD-10-CM | POA: Diagnosis not present

## 2017-06-29 DIAGNOSIS — F319 Bipolar disorder, unspecified: Secondary | ICD-10-CM | POA: Diagnosis not present

## 2017-06-29 DIAGNOSIS — G3184 Mild cognitive impairment, so stated: Secondary | ICD-10-CM | POA: Diagnosis not present

## 2017-06-29 DIAGNOSIS — I10 Essential (primary) hypertension: Secondary | ICD-10-CM | POA: Diagnosis not present

## 2017-06-29 DIAGNOSIS — J449 Chronic obstructive pulmonary disease, unspecified: Secondary | ICD-10-CM | POA: Diagnosis not present

## 2017-06-29 DIAGNOSIS — G40909 Epilepsy, unspecified, not intractable, without status epilepticus: Secondary | ICD-10-CM | POA: Diagnosis not present

## 2017-06-29 DIAGNOSIS — S32010D Wedge compression fracture of first lumbar vertebra, subsequent encounter for fracture with routine healing: Secondary | ICD-10-CM | POA: Diagnosis not present

## 2017-07-01 ENCOUNTER — Ambulatory Visit: Payer: Self-pay | Admitting: Licensed Clinical Social Worker

## 2017-07-01 DIAGNOSIS — I69354 Hemiplegia and hemiparesis following cerebral infarction affecting left non-dominant side: Secondary | ICD-10-CM | POA: Diagnosis not present

## 2017-07-01 DIAGNOSIS — I10 Essential (primary) hypertension: Secondary | ICD-10-CM | POA: Diagnosis not present

## 2017-07-01 DIAGNOSIS — I4891 Unspecified atrial fibrillation: Secondary | ICD-10-CM | POA: Diagnosis not present

## 2017-07-01 DIAGNOSIS — G40909 Epilepsy, unspecified, not intractable, without status epilepticus: Secondary | ICD-10-CM | POA: Diagnosis not present

## 2017-07-01 DIAGNOSIS — J449 Chronic obstructive pulmonary disease, unspecified: Secondary | ICD-10-CM | POA: Diagnosis not present

## 2017-07-01 DIAGNOSIS — G3184 Mild cognitive impairment, so stated: Secondary | ICD-10-CM | POA: Diagnosis not present

## 2017-07-01 DIAGNOSIS — F319 Bipolar disorder, unspecified: Secondary | ICD-10-CM | POA: Diagnosis not present

## 2017-07-01 DIAGNOSIS — S32010D Wedge compression fracture of first lumbar vertebra, subsequent encounter for fracture with routine healing: Secondary | ICD-10-CM | POA: Diagnosis not present

## 2017-07-01 DIAGNOSIS — I2510 Atherosclerotic heart disease of native coronary artery without angina pectoris: Secondary | ICD-10-CM | POA: Diagnosis not present

## 2017-07-02 DIAGNOSIS — F319 Bipolar disorder, unspecified: Secondary | ICD-10-CM | POA: Diagnosis not present

## 2017-07-02 DIAGNOSIS — G40909 Epilepsy, unspecified, not intractable, without status epilepticus: Secondary | ICD-10-CM | POA: Diagnosis not present

## 2017-07-02 DIAGNOSIS — I4891 Unspecified atrial fibrillation: Secondary | ICD-10-CM | POA: Diagnosis not present

## 2017-07-02 DIAGNOSIS — S32010D Wedge compression fracture of first lumbar vertebra, subsequent encounter for fracture with routine healing: Secondary | ICD-10-CM | POA: Diagnosis not present

## 2017-07-02 DIAGNOSIS — I2510 Atherosclerotic heart disease of native coronary artery without angina pectoris: Secondary | ICD-10-CM | POA: Diagnosis not present

## 2017-07-02 DIAGNOSIS — J449 Chronic obstructive pulmonary disease, unspecified: Secondary | ICD-10-CM | POA: Diagnosis not present

## 2017-07-02 DIAGNOSIS — G3184 Mild cognitive impairment, so stated: Secondary | ICD-10-CM | POA: Diagnosis not present

## 2017-07-02 DIAGNOSIS — I69354 Hemiplegia and hemiparesis following cerebral infarction affecting left non-dominant side: Secondary | ICD-10-CM | POA: Diagnosis not present

## 2017-07-02 DIAGNOSIS — I10 Essential (primary) hypertension: Secondary | ICD-10-CM | POA: Diagnosis not present

## 2017-07-03 DIAGNOSIS — I69354 Hemiplegia and hemiparesis following cerebral infarction affecting left non-dominant side: Secondary | ICD-10-CM | POA: Diagnosis not present

## 2017-07-03 DIAGNOSIS — G40909 Epilepsy, unspecified, not intractable, without status epilepticus: Secondary | ICD-10-CM | POA: Diagnosis not present

## 2017-07-03 DIAGNOSIS — J449 Chronic obstructive pulmonary disease, unspecified: Secondary | ICD-10-CM | POA: Diagnosis not present

## 2017-07-03 DIAGNOSIS — I10 Essential (primary) hypertension: Secondary | ICD-10-CM | POA: Diagnosis not present

## 2017-07-03 DIAGNOSIS — G3184 Mild cognitive impairment, so stated: Secondary | ICD-10-CM | POA: Diagnosis not present

## 2017-07-03 DIAGNOSIS — S32010D Wedge compression fracture of first lumbar vertebra, subsequent encounter for fracture with routine healing: Secondary | ICD-10-CM | POA: Diagnosis not present

## 2017-07-03 DIAGNOSIS — I4891 Unspecified atrial fibrillation: Secondary | ICD-10-CM | POA: Diagnosis not present

## 2017-07-03 DIAGNOSIS — I2510 Atherosclerotic heart disease of native coronary artery without angina pectoris: Secondary | ICD-10-CM | POA: Diagnosis not present

## 2017-07-03 DIAGNOSIS — F319 Bipolar disorder, unspecified: Secondary | ICD-10-CM | POA: Diagnosis not present

## 2017-07-04 DIAGNOSIS — G40909 Epilepsy, unspecified, not intractable, without status epilepticus: Secondary | ICD-10-CM | POA: Diagnosis not present

## 2017-07-04 DIAGNOSIS — I69354 Hemiplegia and hemiparesis following cerebral infarction affecting left non-dominant side: Secondary | ICD-10-CM | POA: Diagnosis not present

## 2017-07-04 DIAGNOSIS — G3184 Mild cognitive impairment, so stated: Secondary | ICD-10-CM | POA: Diagnosis not present

## 2017-07-04 DIAGNOSIS — I2510 Atherosclerotic heart disease of native coronary artery without angina pectoris: Secondary | ICD-10-CM | POA: Diagnosis not present

## 2017-07-04 DIAGNOSIS — F319 Bipolar disorder, unspecified: Secondary | ICD-10-CM | POA: Diagnosis not present

## 2017-07-04 DIAGNOSIS — S32010D Wedge compression fracture of first lumbar vertebra, subsequent encounter for fracture with routine healing: Secondary | ICD-10-CM | POA: Diagnosis not present

## 2017-07-04 DIAGNOSIS — J449 Chronic obstructive pulmonary disease, unspecified: Secondary | ICD-10-CM | POA: Diagnosis not present

## 2017-07-04 DIAGNOSIS — I10 Essential (primary) hypertension: Secondary | ICD-10-CM | POA: Diagnosis not present

## 2017-07-04 DIAGNOSIS — I4891 Unspecified atrial fibrillation: Secondary | ICD-10-CM | POA: Diagnosis not present

## 2017-07-07 DIAGNOSIS — I69354 Hemiplegia and hemiparesis following cerebral infarction affecting left non-dominant side: Secondary | ICD-10-CM | POA: Diagnosis not present

## 2017-07-07 DIAGNOSIS — G40909 Epilepsy, unspecified, not intractable, without status epilepticus: Secondary | ICD-10-CM | POA: Diagnosis not present

## 2017-07-07 DIAGNOSIS — I2510 Atherosclerotic heart disease of native coronary artery without angina pectoris: Secondary | ICD-10-CM | POA: Diagnosis not present

## 2017-07-07 DIAGNOSIS — G3184 Mild cognitive impairment, so stated: Secondary | ICD-10-CM | POA: Diagnosis not present

## 2017-07-07 DIAGNOSIS — J449 Chronic obstructive pulmonary disease, unspecified: Secondary | ICD-10-CM | POA: Diagnosis not present

## 2017-07-07 DIAGNOSIS — I4891 Unspecified atrial fibrillation: Secondary | ICD-10-CM | POA: Diagnosis not present

## 2017-07-07 DIAGNOSIS — I10 Essential (primary) hypertension: Secondary | ICD-10-CM | POA: Diagnosis not present

## 2017-07-07 DIAGNOSIS — F319 Bipolar disorder, unspecified: Secondary | ICD-10-CM | POA: Diagnosis not present

## 2017-07-07 DIAGNOSIS — S32010D Wedge compression fracture of first lumbar vertebra, subsequent encounter for fracture with routine healing: Secondary | ICD-10-CM | POA: Diagnosis not present

## 2017-07-08 ENCOUNTER — Other Ambulatory Visit: Payer: Self-pay

## 2017-07-08 DIAGNOSIS — I4891 Unspecified atrial fibrillation: Secondary | ICD-10-CM | POA: Diagnosis not present

## 2017-07-08 DIAGNOSIS — G40909 Epilepsy, unspecified, not intractable, without status epilepticus: Secondary | ICD-10-CM | POA: Diagnosis not present

## 2017-07-08 DIAGNOSIS — I2510 Atherosclerotic heart disease of native coronary artery without angina pectoris: Secondary | ICD-10-CM | POA: Diagnosis not present

## 2017-07-08 DIAGNOSIS — I69354 Hemiplegia and hemiparesis following cerebral infarction affecting left non-dominant side: Secondary | ICD-10-CM | POA: Diagnosis not present

## 2017-07-08 DIAGNOSIS — F319 Bipolar disorder, unspecified: Secondary | ICD-10-CM | POA: Diagnosis not present

## 2017-07-08 DIAGNOSIS — J449 Chronic obstructive pulmonary disease, unspecified: Secondary | ICD-10-CM | POA: Diagnosis not present

## 2017-07-08 DIAGNOSIS — I10 Essential (primary) hypertension: Secondary | ICD-10-CM | POA: Diagnosis not present

## 2017-07-08 DIAGNOSIS — G3184 Mild cognitive impairment, so stated: Secondary | ICD-10-CM | POA: Diagnosis not present

## 2017-07-08 DIAGNOSIS — S32010D Wedge compression fracture of first lumbar vertebra, subsequent encounter for fracture with routine healing: Secondary | ICD-10-CM | POA: Diagnosis not present

## 2017-07-08 NOTE — Patient Outreach (Signed)
[facility]) Care Management   07/08/2017  Jeffrey Sweeney [birth year] [id number]  Jeffrey Sweeney is an 72 y.o. male  Subjective:  Initial home visit complete. Jeffrey Sweeney was alert, oriented and completing JAARS therapy at RN CM's arrival.  Objective:   Review of Systems  Constitutional: Negative.   Eyes: Negative.   Respiratory: Positive for cough. Negative for sputum production.   Cardiovascular: Negative for chest pain and palpitations.  Musculoskeletal: Positive for joint pain.       Member reported chronic joint pain.  Skin: Negative.     Physical Exam  Constitutional: He is oriented to person, place, and time.  Cardiovascular: Normal rate.  Respiratory: Effort normal and breath sounds normal.  GI: Soft.  Neurological: He is alert and oriented to person, place, and time.  Skin: Skin is warm and dry.  Psychiatric: He has a normal mood and affect. His behavior is normal. Judgment and thought content normal.    Encounter Medications:   Outpatient Encounter Medications as of 07/08/2017  Medication Sig  . acetaminophen (TYLENOL) 325 MG tablet Take 2 tablets (650 mg total) by mouth every 6 (six) hours as needed for mild pain (or Fever >/= 101).  Marland Kitchen albuterol (PROVENTIL) (2.5 MG/3ML) 0.083% nebulizer solution Inhale 3 mLs into the lungs every 6 (six) hours as needed for wheezing or shortness of breath.  Marland Kitchen alendronate (FOSAMAX) 70 MG tablet Take 1 tablet (70 mg total) by mouth every 7 (seven) days. Take with a full glass of water on an empty stomach.  . ALPRAZolam (XANAX) 0.5 MG tablet Take 1 tablet (0.5 mg total) by mouth 4 (four) times daily as needed for anxiety.  Marland Kitchen aspirin EC 81 MG tablet Take 81 mg by mouth daily.  . divalproex (DEPAKOTE) 250 MG DR tablet Take 3 tablets (750 mg total) by mouth at bedtime.  Marland Kitchen HYDROcodone-acetaminophen (NORCO/VICODIN) 5-325 MG tablet Take 1-2 tablets by mouth every 4 (four) hours as needed for moderate pain.  Marland Kitchen ibuprofen  (ADVIL,MOTRIN) 800 MG tablet Take 1 tablet (800 mg total) by mouth every 8 (eight) hours as needed.  . levETIRAcetam (KEPPRA) 500 MG tablet Take 1 tablet (500 mg total) by mouth 2 (two) times daily.  . midodrine (PROAMATINE) 5 MG tablet Take 1 tablet (5 mg total) by mouth 3 (three) times daily with meals.  . nicotine (NICODERM CQ - DOSED IN MG/24 HOURS) 21 mg/24hr patch Place 1 patch (21 mg total) onto the skin daily.  . nitroGLYCERIN (NITROSTAT) 0.4 MG SL tablet Place 0.4 mg under the tongue every 5 (five) minutes as needed for chest pain.   Marland Kitchen ondansetron (ZOFRAN) 4 MG tablet Take 1 tablet (4 mg total) by mouth every 6 (six) hours as needed for nausea.  . pantoprazole (PROTONIX) 40 MG tablet Take 40 mg by mouth daily.   No facility-administered encounter medications on file as of 07/08/2017.     Functional Status:   In your present state of health, do you have any difficulty performing the following activities: 05/23/2017  Hearing? N  Vision? N  Difficulty concentrating or making decisions? N  Walking or climbing stairs? Y  Dressing or bathing? Y  Doing errands, shopping? Y  Some recent data might be hidden    Fall/Depression Screening:    Fall Risk  06/01/2015 05/25/2015 04/26/2015  Falls in the past year? Yes Yes Yes  Number falls in past yr: 2 or more 2 or more 2 or more  Injury with Fall?  Yes Yes Yes  Comment - - -  Risk Factor Category  High Fall Risk High Fall Risk High Fall Risk  Risk for fall due to : History of fall(s);Impaired balance/gait History of fall(s);Impaired balance/gait History of fall(s);Impaired balance/gait  Follow up Falls prevention discussed Falls prevention discussed Falls prevention discussed   PHQ 2/9 Scores 06/01/2015 05/25/2015 04/26/2015 03/20/2015 03/01/2015 02/06/2015 02/06/2015  PHQ - 2 Score '2 2 2 '$ 0 0 3 3  PHQ- 9 Score '7 7 7 '$ - - 8 8    Assessment:   RN CM met with Jeffrey Sweeney for initial assessment. Member up in wheelchair and wearing back brace provided  by home health therapist. Jeffrey Sweeney denied falls since returning home and reported his son and various family members were available to help him in the home. Member's primary concern during this visit was obtaining a Hemi Walker. Reported that [place] therapists was working to obtain the equipment. He reported compliance with prescribed medications and stated his sister prepares his pill box. Member denied need for [facility] pharmacist at this time.  Member reported appointment with Primary MD was cancelled and will be rescheduled by his sister. Will follow up with Dr. Luan Pulling on 08/05/27. Discussed outreach from [facility] for transportation assistance. Jeffrey Sweeney reported family members were available to assist with appointment scheduling and transportation. He agreed to notify RN CM if referral for transportation assistance is needed.  Jeffrey Sweeney reported he was on the waiting list for the Meals on Wheels program. RN CM discussed available community resources and provided member with information for local food distribution centers.   Member denied urgent concerns and agreed to continued outreach. RN CM contact number and 24 hour Nurse Line provided. Member advised to contact RN CM with questions or concerns as needed.  THN CM Care Plan Problem One     Most Recent Value  Care Plan Problem One  Risk for Readmission  Role Documenting the Problem One  Care Management [place] for Problem One  Active  THN Long Term Goal   Patient will not have a hospitalization over the next 60 days.  THN Long Term Goal Start Date  07/08/17  Interventions for Problem One Long Term Goal  RN CM reviewed signs and symptoms of COPD exacerbation. Reviewed indications for notifying MD.  THN CM Short Term Goal #1   Over the next 30 days patient will attend follow up with MD.  [facility] CM Short Term Goal #1 Start Date  07/08/17  Interventions for Short Term Goal #1  Discussed importance of attending MD follow  ups. Discussed transportation availability.  THN CM Short Term Goal #2   Over the next 30 days patient will take all medications as prescribed.  THN CM Short Term Goal #2 Start Date  07/08/17  Interventions for Short Term Goal #2  Reviewed medications and discussed importance of medication compliance. [Medications prepared by [employer] sister.]    [facility] CM Care Plan Problem Two     Most Recent Value  Care Plan Problem Two  High Risk for Falls  Role Documenting the Problem Two  Care Management [place] for Problem Two  Active  Interventions for Problem Two Long Term Goal   RN CM discussed safety and fall precautions.  THN Long Term Goal  Over the next 60 days patient will not experience fall related injuries.  THN Long Term Goal Start Date  07/08/17  [facility] CM Short Term Goal #1  Over the next 30 days patient will wear back brace as recommended by Home Health Therapist.  [facility] CM Short Term Goal #1 Start Date  07/08/17  Interventions for Short Term Goal #2   Discussed importance of  wearing device as prescribed by therapist.  [facility] CM Short Term Goal #2   Over the next 30 days patient will use walker when transferring in and out of wheelchair.  THN CM Short Term Goal #2 Start Date  07/08/17  Interventions for Short Term Goal #2  Discussed fall prevention measures.         PLAN RN CM will follow up with member on next week.   [place] [phone]

## 2017-07-09 DIAGNOSIS — I10 Essential (primary) hypertension: Secondary | ICD-10-CM | POA: Diagnosis not present

## 2017-07-09 DIAGNOSIS — J449 Chronic obstructive pulmonary disease, unspecified: Secondary | ICD-10-CM | POA: Diagnosis not present

## 2017-07-09 DIAGNOSIS — S32010D Wedge compression fracture of first lumbar vertebra, subsequent encounter for fracture with routine healing: Secondary | ICD-10-CM | POA: Diagnosis not present

## 2017-07-09 DIAGNOSIS — G40909 Epilepsy, unspecified, not intractable, without status epilepticus: Secondary | ICD-10-CM | POA: Diagnosis not present

## 2017-07-09 DIAGNOSIS — F319 Bipolar disorder, unspecified: Secondary | ICD-10-CM | POA: Diagnosis not present

## 2017-07-09 DIAGNOSIS — I69354 Hemiplegia and hemiparesis following cerebral infarction affecting left non-dominant side: Secondary | ICD-10-CM | POA: Diagnosis not present

## 2017-07-09 DIAGNOSIS — G3184 Mild cognitive impairment, so stated: Secondary | ICD-10-CM | POA: Diagnosis not present

## 2017-07-09 DIAGNOSIS — I2510 Atherosclerotic heart disease of native coronary artery without angina pectoris: Secondary | ICD-10-CM | POA: Diagnosis not present

## 2017-07-09 DIAGNOSIS — I4891 Unspecified atrial fibrillation: Secondary | ICD-10-CM | POA: Diagnosis not present

## 2017-07-10 DIAGNOSIS — I10 Essential (primary) hypertension: Secondary | ICD-10-CM | POA: Diagnosis not present

## 2017-07-10 DIAGNOSIS — I2510 Atherosclerotic heart disease of native coronary artery without angina pectoris: Secondary | ICD-10-CM | POA: Diagnosis not present

## 2017-07-10 DIAGNOSIS — I69354 Hemiplegia and hemiparesis following cerebral infarction affecting left non-dominant side: Secondary | ICD-10-CM | POA: Diagnosis not present

## 2017-07-10 DIAGNOSIS — I4891 Unspecified atrial fibrillation: Secondary | ICD-10-CM | POA: Diagnosis not present

## 2017-07-10 DIAGNOSIS — G3184 Mild cognitive impairment, so stated: Secondary | ICD-10-CM | POA: Diagnosis not present

## 2017-07-10 DIAGNOSIS — J449 Chronic obstructive pulmonary disease, unspecified: Secondary | ICD-10-CM | POA: Diagnosis not present

## 2017-07-10 DIAGNOSIS — F319 Bipolar disorder, unspecified: Secondary | ICD-10-CM | POA: Diagnosis not present

## 2017-07-10 DIAGNOSIS — S32010D Wedge compression fracture of first lumbar vertebra, subsequent encounter for fracture with routine healing: Secondary | ICD-10-CM | POA: Diagnosis not present

## 2017-07-10 DIAGNOSIS — G40909 Epilepsy, unspecified, not intractable, without status epilepticus: Secondary | ICD-10-CM | POA: Diagnosis not present

## 2017-07-11 DIAGNOSIS — F319 Bipolar disorder, unspecified: Secondary | ICD-10-CM | POA: Diagnosis not present

## 2017-07-11 DIAGNOSIS — I2510 Atherosclerotic heart disease of native coronary artery without angina pectoris: Secondary | ICD-10-CM | POA: Diagnosis not present

## 2017-07-11 DIAGNOSIS — J449 Chronic obstructive pulmonary disease, unspecified: Secondary | ICD-10-CM | POA: Diagnosis not present

## 2017-07-11 DIAGNOSIS — I4891 Unspecified atrial fibrillation: Secondary | ICD-10-CM | POA: Diagnosis not present

## 2017-07-11 DIAGNOSIS — G3184 Mild cognitive impairment, so stated: Secondary | ICD-10-CM | POA: Diagnosis not present

## 2017-07-11 DIAGNOSIS — G40909 Epilepsy, unspecified, not intractable, without status epilepticus: Secondary | ICD-10-CM | POA: Diagnosis not present

## 2017-07-11 DIAGNOSIS — S32010D Wedge compression fracture of first lumbar vertebra, subsequent encounter for fracture with routine healing: Secondary | ICD-10-CM | POA: Diagnosis not present

## 2017-07-11 DIAGNOSIS — I69354 Hemiplegia and hemiparesis following cerebral infarction affecting left non-dominant side: Secondary | ICD-10-CM | POA: Diagnosis not present

## 2017-07-11 DIAGNOSIS — I10 Essential (primary) hypertension: Secondary | ICD-10-CM | POA: Diagnosis not present

## 2017-07-15 ENCOUNTER — Other Ambulatory Visit: Payer: Self-pay | Admitting: *Deleted

## 2017-07-15 DIAGNOSIS — S32010D Wedge compression fracture of first lumbar vertebra, subsequent encounter for fracture with routine healing: Secondary | ICD-10-CM | POA: Diagnosis not present

## 2017-07-15 DIAGNOSIS — G3184 Mild cognitive impairment, so stated: Secondary | ICD-10-CM | POA: Diagnosis not present

## 2017-07-15 DIAGNOSIS — I4891 Unspecified atrial fibrillation: Secondary | ICD-10-CM | POA: Diagnosis not present

## 2017-07-15 DIAGNOSIS — I69354 Hemiplegia and hemiparesis following cerebral infarction affecting left non-dominant side: Secondary | ICD-10-CM | POA: Diagnosis not present

## 2017-07-15 DIAGNOSIS — I10 Essential (primary) hypertension: Secondary | ICD-10-CM | POA: Diagnosis not present

## 2017-07-15 DIAGNOSIS — F319 Bipolar disorder, unspecified: Secondary | ICD-10-CM | POA: Diagnosis not present

## 2017-07-15 DIAGNOSIS — I2510 Atherosclerotic heart disease of native coronary artery without angina pectoris: Secondary | ICD-10-CM | POA: Diagnosis not present

## 2017-07-15 DIAGNOSIS — J449 Chronic obstructive pulmonary disease, unspecified: Secondary | ICD-10-CM | POA: Diagnosis not present

## 2017-07-15 DIAGNOSIS — G40909 Epilepsy, unspecified, not intractable, without status epilepticus: Secondary | ICD-10-CM | POA: Diagnosis not present

## 2017-07-15 NOTE — Patient Outreach (Signed)
[name]) Care Management  07/15/2017  Jeffrey Sweeney [birth year] [id number]   [place] had received referral from [place] as follow-up from discharge from [facility] SNF on 6/22 to return home with family. Patient is also being followed by [place] in the "[facility]" program (HHPT, [place]). Per [facility] rep, patient's home environment may experience some family dysfunction, occasional disagreements of family members with patient. [name] called & spoke with patient to follow-up on discharge from SNF, patient reports that he has been doing well at home and that his son, Jeffrey Sweeney has been staying with him. Patient reports no issues with his son staying with him and no disagreements. Patient reports that he had an appointment coming up at the end of July with Dr. Luan Pulling and his sister plans to take him to his appointment. Patient reports no social work needs at this time. [name] encouraged patient to keep [name] phone number and call if any needs came up.   [name] will close case at this time.    Raynaldo Opitz, LCSW [facility]  Clinical Social Worker cell #: [phone]

## 2017-07-16 DIAGNOSIS — G3184 Mild cognitive impairment, so stated: Secondary | ICD-10-CM | POA: Diagnosis not present

## 2017-07-16 DIAGNOSIS — F319 Bipolar disorder, unspecified: Secondary | ICD-10-CM | POA: Diagnosis not present

## 2017-07-16 DIAGNOSIS — I4891 Unspecified atrial fibrillation: Secondary | ICD-10-CM | POA: Diagnosis not present

## 2017-07-16 DIAGNOSIS — I69354 Hemiplegia and hemiparesis following cerebral infarction affecting left non-dominant side: Secondary | ICD-10-CM | POA: Diagnosis not present

## 2017-07-16 DIAGNOSIS — S32010D Wedge compression fracture of first lumbar vertebra, subsequent encounter for fracture with routine healing: Secondary | ICD-10-CM | POA: Diagnosis not present

## 2017-07-16 DIAGNOSIS — I2510 Atherosclerotic heart disease of native coronary artery without angina pectoris: Secondary | ICD-10-CM | POA: Diagnosis not present

## 2017-07-16 DIAGNOSIS — J449 Chronic obstructive pulmonary disease, unspecified: Secondary | ICD-10-CM | POA: Diagnosis not present

## 2017-07-16 DIAGNOSIS — I10 Essential (primary) hypertension: Secondary | ICD-10-CM | POA: Diagnosis not present

## 2017-07-16 DIAGNOSIS — G40909 Epilepsy, unspecified, not intractable, without status epilepticus: Secondary | ICD-10-CM | POA: Diagnosis not present

## 2017-07-18 ENCOUNTER — Other Ambulatory Visit: Payer: Self-pay

## 2017-07-18 DIAGNOSIS — I4891 Unspecified atrial fibrillation: Secondary | ICD-10-CM | POA: Diagnosis not present

## 2017-07-18 DIAGNOSIS — I2510 Atherosclerotic heart disease of native coronary artery without angina pectoris: Secondary | ICD-10-CM | POA: Diagnosis not present

## 2017-07-18 DIAGNOSIS — S32010D Wedge compression fracture of first lumbar vertebra, subsequent encounter for fracture with routine healing: Secondary | ICD-10-CM | POA: Diagnosis not present

## 2017-07-18 DIAGNOSIS — F319 Bipolar disorder, unspecified: Secondary | ICD-10-CM | POA: Diagnosis not present

## 2017-07-18 DIAGNOSIS — I69354 Hemiplegia and hemiparesis following cerebral infarction affecting left non-dominant side: Secondary | ICD-10-CM | POA: Diagnosis not present

## 2017-07-18 DIAGNOSIS — G3184 Mild cognitive impairment, so stated: Secondary | ICD-10-CM | POA: Diagnosis not present

## 2017-07-18 DIAGNOSIS — I10 Essential (primary) hypertension: Secondary | ICD-10-CM | POA: Diagnosis not present

## 2017-07-18 DIAGNOSIS — G40909 Epilepsy, unspecified, not intractable, without status epilepticus: Secondary | ICD-10-CM | POA: Diagnosis not present

## 2017-07-18 DIAGNOSIS — J449 Chronic obstructive pulmonary disease, unspecified: Secondary | ICD-10-CM | POA: Diagnosis not present

## 2017-07-18 NOTE — Patient Outreach (Signed)
[facility]) Care Management  07/18/2017  JOURNEE KOHEN 01-11-1945 [id number]   RN CM contacted Mr. Brosnahan for weekly outreach. Member unavailable. Spoke with family member Financial planner. Requested call back on next week to determine Mr. Carneiro availability for follow up visit.   PLAN RN CM will follow up on next week.   [place] [phone]

## 2017-07-21 ENCOUNTER — Other Ambulatory Visit: Payer: Self-pay

## 2017-07-21 DIAGNOSIS — G3184 Mild cognitive impairment, so stated: Secondary | ICD-10-CM | POA: Diagnosis not present

## 2017-07-21 DIAGNOSIS — I69354 Hemiplegia and hemiparesis following cerebral infarction affecting left non-dominant side: Secondary | ICD-10-CM | POA: Diagnosis not present

## 2017-07-21 DIAGNOSIS — J449 Chronic obstructive pulmonary disease, unspecified: Secondary | ICD-10-CM | POA: Diagnosis not present

## 2017-07-21 DIAGNOSIS — S32010D Wedge compression fracture of first lumbar vertebra, subsequent encounter for fracture with routine healing: Secondary | ICD-10-CM | POA: Diagnosis not present

## 2017-07-21 DIAGNOSIS — I10 Essential (primary) hypertension: Secondary | ICD-10-CM | POA: Diagnosis not present

## 2017-07-21 DIAGNOSIS — I2510 Atherosclerotic heart disease of native coronary artery without angina pectoris: Secondary | ICD-10-CM | POA: Diagnosis not present

## 2017-07-21 DIAGNOSIS — I4891 Unspecified atrial fibrillation: Secondary | ICD-10-CM | POA: Diagnosis not present

## 2017-07-21 DIAGNOSIS — G40909 Epilepsy, unspecified, not intractable, without status epilepticus: Secondary | ICD-10-CM | POA: Diagnosis not present

## 2017-07-21 DIAGNOSIS — F319 Bipolar disorder, unspecified: Secondary | ICD-10-CM | POA: Diagnosis not present

## 2017-07-21 NOTE — Patient Outreach (Signed)
[facility]) Care Management  07/21/2017  JAYMZ TRAYWICK 14-Mar-1945 [id number]    RN CM placed follow up call to confirm Mr. Sanfilippo availability for a home visit. Home visit scheduled for 07/23/17.   PLAN RN CM will complete visit on 07/23/17.    [place] [phone]

## 2017-07-22 DIAGNOSIS — I69354 Hemiplegia and hemiparesis following cerebral infarction affecting left non-dominant side: Secondary | ICD-10-CM | POA: Diagnosis not present

## 2017-07-22 DIAGNOSIS — G40909 Epilepsy, unspecified, not intractable, without status epilepticus: Secondary | ICD-10-CM | POA: Diagnosis not present

## 2017-07-22 DIAGNOSIS — I10 Essential (primary) hypertension: Secondary | ICD-10-CM | POA: Diagnosis not present

## 2017-07-22 DIAGNOSIS — S32010D Wedge compression fracture of first lumbar vertebra, subsequent encounter for fracture with routine healing: Secondary | ICD-10-CM | POA: Diagnosis not present

## 2017-07-22 DIAGNOSIS — F319 Bipolar disorder, unspecified: Secondary | ICD-10-CM | POA: Diagnosis not present

## 2017-07-22 DIAGNOSIS — I2510 Atherosclerotic heart disease of native coronary artery without angina pectoris: Secondary | ICD-10-CM | POA: Diagnosis not present

## 2017-07-22 DIAGNOSIS — I4891 Unspecified atrial fibrillation: Secondary | ICD-10-CM | POA: Diagnosis not present

## 2017-07-22 DIAGNOSIS — G3184 Mild cognitive impairment, so stated: Secondary | ICD-10-CM | POA: Diagnosis not present

## 2017-07-22 DIAGNOSIS — J449 Chronic obstructive pulmonary disease, unspecified: Secondary | ICD-10-CM | POA: Diagnosis not present

## 2017-07-23 DIAGNOSIS — I4891 Unspecified atrial fibrillation: Secondary | ICD-10-CM | POA: Diagnosis not present

## 2017-07-23 DIAGNOSIS — I2510 Atherosclerotic heart disease of native coronary artery without angina pectoris: Secondary | ICD-10-CM | POA: Diagnosis not present

## 2017-07-23 DIAGNOSIS — F319 Bipolar disorder, unspecified: Secondary | ICD-10-CM | POA: Diagnosis not present

## 2017-07-23 DIAGNOSIS — S32010D Wedge compression fracture of first lumbar vertebra, subsequent encounter for fracture with routine healing: Secondary | ICD-10-CM | POA: Diagnosis not present

## 2017-07-23 DIAGNOSIS — G3184 Mild cognitive impairment, so stated: Secondary | ICD-10-CM | POA: Diagnosis not present

## 2017-07-23 DIAGNOSIS — G40909 Epilepsy, unspecified, not intractable, without status epilepticus: Secondary | ICD-10-CM | POA: Diagnosis not present

## 2017-07-23 DIAGNOSIS — I69354 Hemiplegia and hemiparesis following cerebral infarction affecting left non-dominant side: Secondary | ICD-10-CM | POA: Diagnosis not present

## 2017-07-23 DIAGNOSIS — J449 Chronic obstructive pulmonary disease, unspecified: Secondary | ICD-10-CM | POA: Diagnosis not present

## 2017-07-23 DIAGNOSIS — I10 Essential (primary) hypertension: Secondary | ICD-10-CM | POA: Diagnosis not present

## 2017-07-25 DIAGNOSIS — S32010D Wedge compression fracture of first lumbar vertebra, subsequent encounter for fracture with routine healing: Secondary | ICD-10-CM | POA: Diagnosis not present

## 2017-07-25 DIAGNOSIS — J449 Chronic obstructive pulmonary disease, unspecified: Secondary | ICD-10-CM | POA: Diagnosis not present

## 2017-07-25 DIAGNOSIS — G3184 Mild cognitive impairment, so stated: Secondary | ICD-10-CM | POA: Diagnosis not present

## 2017-07-25 DIAGNOSIS — I69354 Hemiplegia and hemiparesis following cerebral infarction affecting left non-dominant side: Secondary | ICD-10-CM | POA: Diagnosis not present

## 2017-07-25 DIAGNOSIS — I10 Essential (primary) hypertension: Secondary | ICD-10-CM | POA: Diagnosis not present

## 2017-07-25 DIAGNOSIS — G40909 Epilepsy, unspecified, not intractable, without status epilepticus: Secondary | ICD-10-CM | POA: Diagnosis not present

## 2017-07-25 DIAGNOSIS — I2510 Atherosclerotic heart disease of native coronary artery without angina pectoris: Secondary | ICD-10-CM | POA: Diagnosis not present

## 2017-07-25 DIAGNOSIS — F319 Bipolar disorder, unspecified: Secondary | ICD-10-CM | POA: Diagnosis not present

## 2017-07-25 DIAGNOSIS — I4891 Unspecified atrial fibrillation: Secondary | ICD-10-CM | POA: Diagnosis not present

## 2017-07-28 DIAGNOSIS — R293 Abnormal posture: Secondary | ICD-10-CM | POA: Diagnosis not present

## 2017-07-28 DIAGNOSIS — I69352 Hemiplegia and hemiparesis following cerebral infarction affecting left dominant side: Secondary | ICD-10-CM | POA: Diagnosis not present

## 2017-07-28 DIAGNOSIS — I951 Orthostatic hypotension: Secondary | ICD-10-CM | POA: Diagnosis not present

## 2017-07-28 DIAGNOSIS — I259 Chronic ischemic heart disease, unspecified: Secondary | ICD-10-CM | POA: Diagnosis not present

## 2017-07-28 DIAGNOSIS — R55 Syncope and collapse: Secondary | ICD-10-CM | POA: Diagnosis not present

## 2017-07-28 DIAGNOSIS — R269 Unspecified abnormalities of gait and mobility: Secondary | ICD-10-CM | POA: Diagnosis not present

## 2017-07-28 DIAGNOSIS — I2510 Atherosclerotic heart disease of native coronary artery without angina pectoris: Secondary | ICD-10-CM | POA: Diagnosis not present

## 2017-07-28 DIAGNOSIS — R262 Difficulty in walking, not elsewhere classified: Secondary | ICD-10-CM | POA: Diagnosis not present

## 2017-07-28 DIAGNOSIS — M6281 Muscle weakness (generalized): Secondary | ICD-10-CM | POA: Diagnosis not present

## 2017-08-02 DIAGNOSIS — F319 Bipolar disorder, unspecified: Secondary | ICD-10-CM | POA: Diagnosis not present

## 2017-08-02 DIAGNOSIS — I2510 Atherosclerotic heart disease of native coronary artery without angina pectoris: Secondary | ICD-10-CM | POA: Diagnosis not present

## 2017-08-02 DIAGNOSIS — I10 Essential (primary) hypertension: Secondary | ICD-10-CM | POA: Diagnosis not present

## 2017-08-02 DIAGNOSIS — I69354 Hemiplegia and hemiparesis following cerebral infarction affecting left non-dominant side: Secondary | ICD-10-CM | POA: Diagnosis not present

## 2017-08-02 DIAGNOSIS — I4891 Unspecified atrial fibrillation: Secondary | ICD-10-CM | POA: Diagnosis not present

## 2017-08-02 DIAGNOSIS — G40909 Epilepsy, unspecified, not intractable, without status epilepticus: Secondary | ICD-10-CM | POA: Diagnosis not present

## 2017-08-02 DIAGNOSIS — G3184 Mild cognitive impairment, so stated: Secondary | ICD-10-CM | POA: Diagnosis not present

## 2017-08-02 DIAGNOSIS — J449 Chronic obstructive pulmonary disease, unspecified: Secondary | ICD-10-CM | POA: Diagnosis not present

## 2017-08-02 DIAGNOSIS — S32010D Wedge compression fracture of first lumbar vertebra, subsequent encounter for fracture with routine healing: Secondary | ICD-10-CM | POA: Diagnosis not present

## 2017-08-04 ENCOUNTER — Other Ambulatory Visit: Payer: Self-pay

## 2017-08-04 NOTE — Patient Outreach (Signed)
[facility]) Care Management  08/04/2017  AHAD COLARUSSO 03-20-1945 [id number]    RN CM contacted Mr. Unrein for follow up outreach. Unable to reach member at listed number. Left HIPAA compliant voice message requesting a return call.  PLAN RN CM will attempt to contact member's daughter. RN CM will attempt to contact member within 3-4 business days.    [place] [phone]

## 2017-08-08 ENCOUNTER — Other Ambulatory Visit: Payer: Self-pay

## 2017-08-08 NOTE — Patient Outreach (Signed)
[facility]) Care Management  08/08/2017  Jeffrey Sweeney [birth year] [id number]   RN CM attempted to reach patient to complete TOC. Unable to reach patient since contact with family member on 07/21/17. Left HIPAA compliant voice message requesting a return call.  PLAN  RN CM will attempt to contact member in 3-4 business days.   [place] [phone]

## 2017-08-09 ENCOUNTER — Encounter (HOSPITAL_COMMUNITY): Payer: Self-pay | Admitting: *Deleted

## 2017-08-09 ENCOUNTER — Other Ambulatory Visit: Payer: Self-pay

## 2017-08-09 ENCOUNTER — Emergency Department (HOSPITAL_COMMUNITY)
Admission: EM | Admit: 2017-08-09 | Discharge: 2017-09-07 | Disposition: E | Payer: Medicare HMO | Attending: Emergency Medicine | Admitting: Emergency Medicine

## 2017-08-09 DIAGNOSIS — I10 Essential (primary) hypertension: Secondary | ICD-10-CM | POA: Insufficient documentation

## 2017-08-09 DIAGNOSIS — Z79899 Other long term (current) drug therapy: Secondary | ICD-10-CM | POA: Insufficient documentation

## 2017-08-09 DIAGNOSIS — Z7982 Long term (current) use of aspirin: Secondary | ICD-10-CM | POA: Insufficient documentation

## 2017-08-09 DIAGNOSIS — I469 Cardiac arrest, cause unspecified: Secondary | ICD-10-CM | POA: Insufficient documentation

## 2017-08-09 DIAGNOSIS — F17210 Nicotine dependence, cigarettes, uncomplicated: Secondary | ICD-10-CM | POA: Diagnosis not present

## 2017-08-09 DIAGNOSIS — I499 Cardiac arrhythmia, unspecified: Secondary | ICD-10-CM | POA: Diagnosis not present

## 2017-08-09 DIAGNOSIS — J449 Chronic obstructive pulmonary disease, unspecified: Secondary | ICD-10-CM | POA: Insufficient documentation

## 2017-08-09 DIAGNOSIS — I2510 Atherosclerotic heart disease of native coronary artery without angina pectoris: Secondary | ICD-10-CM | POA: Insufficient documentation

## 2017-08-09 DIAGNOSIS — R404 Transient alteration of awareness: Secondary | ICD-10-CM | POA: Diagnosis not present

## 2017-08-09 DIAGNOSIS — R0689 Other abnormalities of breathing: Secondary | ICD-10-CM | POA: Diagnosis not present

## 2017-08-09 DIAGNOSIS — I4510 Unspecified right bundle-branch block: Secondary | ICD-10-CM | POA: Diagnosis not present

## 2017-08-09 DIAGNOSIS — R4020 Unspecified coma: Secondary | ICD-10-CM | POA: Diagnosis not present

## 2017-08-18 ENCOUNTER — Other Ambulatory Visit: Payer: Self-pay

## 2017-09-07 NOTE — ED Notes (Signed)
AC spoke with ME on call, [facility]. Pt has significant history and is followed by PCP whom he has seen recently and is willing to sign the death certificate. Pt is not an ME case.

## 2017-09-07 NOTE — ED Triage Notes (Signed)
Ems called out to pt's residence after pt became unresponsive. Son advised ems that pt has been feeling like he had pneumonia for the past few days, son was assisting pt to the shower when pt became unresponsive, upon ems arrival pt cpr in progress, pt was given a total of 6 1mg  epi by ems with return of pulses 3 different times, upon arrival to er pt asystole, cpr in progress, no cardiac activity noted by ultrasound per Dr Melina Copa at bedside, time of death was 19:05

## 2017-09-07 NOTE — ED Provider Notes (Signed)
[facility] EMERGENCY DEPARTMENT Provider Note   CSN: [id number] Arrival date & time: August 30, 2017  1909     History   Chief Complaint No chief complaint on file.   HPI Jeffrey Sweeney is a 72 y.o. male.  Level 5 caveat secondary to unresponsive.  History is via EMS.  Patient reportedly did not feel well for the last few days.  EMS was called for an unresponsive episode after the patient collapsed in front of his son.  When EMS got there fire was already starting CPR.  His initial rhythm was PEA.  He underwent about 35 minutes of CPR during transfer.  He was intubated with a Edison Pace.  He intermittently had return of spontaneous circulation with epi but each time lost pulses again fairly quickly.   Cardiac Arrest  Witnessed by:  Family member Incident location:  Home Time since incident:  45 minutes Condition upon EMS arrival:  Unresponsive Pulse:  Absent Initial cardiac rhythm per EMS:  PEA Treatments prior to arrival:  ACLS protocol, intubation and vascular access Medications given prior to ED:  Epinephrine and atropine IV access type:  Intraosseous Airway:  Combitube Rhythm on admission to ED:  Paced Associated symptoms: no weakness   Risk factors: COPD and heart problem     Past Medical History:  Diagnosis Date  . AF (atrial fibrillation) ([place])   . Bipolar disorder ([place])   . CAD (coronary artery disease)   . COPD (chronic obstructive pulmonary disease) ([place])   . Depression   . Headache   . Heart disease   . Hypertension   . Seizures ([place])   . Stroke [facility])    left arm weakness    Patient Active Problem List   Diagnosis Date Noted  . Lumbar compression fracture, closed, initial encounter ([place]) 05/23/2017  . Hemiparesis affecting left side as late effect of cerebrovascular accident (CVA) ([place]) 07/08/2016  . Personal history of stroke with current residual effects 07/08/2016  . Seizure ([place]) 07/04/2016  . Status epilepticus ([place]) 01/09/2016  . Osteoporosis 01/07/2015    . Compression fracture of lumbar spine, non-traumatic ([place]) 01/07/2015  . COPD exacerbation ([place]) 01/05/2015  . Falls 01/05/2015  . Hypokalemia 08/14/2011  . DVT (deep venous thrombosis) ([place]) 08/14/2011  . Atrial fibrillation ([place]) 08/13/2011  . Rib fractures 08/07/2011  . Respiratory distress 08/07/2011  . COPD (chronic obstructive pulmonary disease) ([place]) 08/07/2011  . Atelectasis 08/07/2011  . H/O chest tube placement 08/07/2011  . Hemothorax 08/07/2011  . Hyperlipidemia 07/17/2010  . ANXIETY DEPRESSION 07/17/2010  . Essential hypertension 07/17/2010  . Coronary atherosclerosis 07/17/2010  . CORONARY ATHEROSCLEROSIS NATIVE CORONARY ARTERY 07/17/2010  . Stroke ([place]) 07/17/2010  . CEREBROVASCULAR DISEASE 07/17/2010  . GERD 07/17/2010  . Seizure disorder ([place]) 07/17/2010  . SPLENIC INFARCTION 07/05/2009  . EMBOLISM&THROMBOSIS OF OTHER SPECIFIED ARTERY 07/05/2009  . EARLY SATIETY 07/05/2009  . WEIGHT LOSS, ABNORMAL 07/05/2009  . DIARRHEA 07/05/2009  . LUQ PAIN 07/05/2009  . PERSONAL HISTORY OF COLONIC POLYPS 07/05/2009  . Orthostatic hypotension 05/01/2009  . SYNCOPE 05/01/2009  . CHEST PAIN 05/01/2009  . LOW BACK PAIN, MILD 04/20/2009  . Bipolar 1 disorder, mixed, moderate ([place]) 04/20/2009    Past Surgical History:  Procedure Laterality Date  . ANKLE FRACTURE SURGERY          Home Medications    Prior to Admission medications   Medication Sig Start Date End Date Taking? Authorizing Provider  acetaminophen (TYLENOL) 325 MG tablet Take 2 tablets (650 mg total) by  mouth every 6 (six) hours as needed for mild pain (or Fever >/= 101). 05/27/17   Sinda Du, MD  albuterol (PROVENTIL) (2.5 MG/3ML) 0.083% nebulizer solution Inhale 3 mLs into the lungs every 6 (six) hours as needed for wheezing or shortness of breath. 05/27/17   Sinda Du, MD  alendronate (FOSAMAX) 70 MG tablet Take 1 tablet (70 mg total) by mouth every 7 (seven) days. Take with a full glass of  water on an empty stomach. 05/27/17 05/27/18  Sinda Du, MD  ALPRAZolam Duanne Moron) 0.5 MG tablet Take 1 tablet (0.5 mg total) by mouth 4 (four) times daily as needed for anxiety. 05/27/17   Sinda Du, MD  aspirin EC 81 MG tablet Take 81 mg by mouth daily.    [provider]  divalproex (DEPAKOTE) 250 MG DR tablet Take 3 tablets (750 mg total) by mouth at bedtime. 05/27/17   Sinda Du, MD  HYDROcodone-acetaminophen (NORCO/VICODIN) 5-325 MG tablet Take 1-2 tablets by mouth every 4 (four) hours as needed for moderate pain. 05/27/17   Sinda Du, MD  ibuprofen (ADVIL,MOTRIN) 800 MG tablet Take 1 tablet (800 mg total) by mouth every 8 (eight) hours as needed. 05/27/17   Sinda Du, MD  levETIRAcetam (KEPPRA) 500 MG tablet Take 1 tablet (500 mg total) by mouth 2 (two) times daily. 05/27/17   Sinda Du, MD  midodrine (PROAMATINE) 5 MG tablet Take 1 tablet (5 mg total) by mouth 3 (three) times daily with meals. 05/27/17   Sinda Du, MD  nicotine (NICODERM CQ - DOSED IN MG/24 HOURS) 21 mg/24hr patch Place 1 patch (21 mg total) onto the skin daily. 05/28/17   Sinda Du, MD  nitroGLYCERIN (NITROSTAT) 0.4 MG SL tablet Place 0.4 mg under the tongue every 5 (five) minutes as needed for chest pain.     [provider]  ondansetron (ZOFRAN) 4 MG tablet Take 1 tablet (4 mg total) by mouth every 6 (six) hours as needed for nausea. 05/27/17   Sinda Du, MD  pantoprazole (PROTONIX) 40 MG tablet Take 40 mg by mouth daily.    [provider]    Family History Family History  Problem Relation Age of Onset  . Cancer Mother   . Cancer Father     Social History Social History   Tobacco Use  . Smoking status: Current Every Day Smoker    Packs/day: 0.50    Years: 50.00    Pack years: 25.00    Types: Cigarettes  . Smokeless tobacco: Never Used  Substance Use Topics  . Alcohol use: No  . Drug use: No     Allergies   Meperidine hcl   Review  of Systems Review of Systems  Unable to perform ROS: Patient unresponsive  Neurological: Negative for weakness.     Physical Exam Updated Vital Signs There were no vitals taken for this visit.  Physical Exam  Constitutional: He appears well-developed and well-nourished.  HENT:  Head: Normocephalic and atraumatic.  Patient orally intubated with a Edison Pace.  Bagging easily.  Eyes:  Pupils are fixed and dilated.  Neck:  Trach midline.  No crepitus.  Cardiovascular:  No heart sounds and no peripheral pulses.  Pulmonary/Chest:  Equal breath sounds by ambu  Abdominal: Soft. He exhibits no mass.  Genitourinary: Penis normal.  Musculoskeletal: Normal range of motion. He exhibits no deformity.  Neurological:  Patient is unresponsive.  No spontaneous movement no spontaneous respirations.  No withdrawal to pain.  Skin:  Patient skin is cool and  pale.  Dry.  Nursing note and vitals reviewed.    ED Treatments / Results  Labs (all labs ordered are listed, but only abnormal results are displayed) Labs Reviewed - No data to display  EKG None  Radiology No results found.  Procedures Procedures (including critical care time)  Medications Ordered in ED Medications - No data to display   Initial Impression / Assessment and Plan / ED Course  I have reviewed the triage vital signs and the nursing notes.  Pertinent labs & imaging results that were available during my care of the patient were reviewed by me and considered in my medical decision making (see chart for details).  Clinical Course as of Aug 10 1041  Sat Aug 09, 2017  1913 Patient arrives in the ED and active CPR in progress.  On moving over to the main stretcher he has been bagging easily.  His last epi was just prior to arrival.  I did a bedside cardiac ultrasound and there was no cardiac activity.  No pericardial effusion.  Images not saved.  As the patient had already been receiving ACLS interventions for 35 minutes I  felt that any further interventions would be futile.  Patient was called expired at 7:05 PM.  Awaiting family.   [MB]  1939 As the patient had a sudden collapse at home and no signs of trauma I do not feel this would be an ME case.  I placed a call into the patient's primary care doctor or other coverage.  Family members are starting to show up now so briefly inform them but will have a more formal discussion with him as they are here as a group.   [MB]  2018 Discussed with Dr. Luan Pulling primary care doctor who states he would sign the death certificate   [MB]    Clinical Course User Index [MB] Hayden Rasmussen, MD     EMERGENCY DEPARTMENT [place] CARDIAC EXAM "Study: Limited Ultrasound of the Heart and Pericardium"  INDICATIONS:Cardiac arrest Multiple views of the heart and pericardium were obtained in real-time with a multi-frequency probe.  PERFORMED EX:BMWUXL IMAGES ARCHIVED?: No LIMITATIONS:  Emergent procedure VIEWS USED: Parasternal short axis INTERPRETATION: Cardiac activity absent, no pericardial effusion   Final Clinical Impressions(s) / ED Diagnoses   Final diagnoses:  Cardiac arrest [facility])    ED Discharge Orders    None       Hayden Rasmussen, MD 08/10/17 1046

## 2017-09-07 NOTE — ED Notes (Signed)
[place] [facility] contacted, spoke Jeffrey Sweeney who advised that pt would be tissue donor only,  Reference number [phone]

## 2017-09-07 DEATH — deceased

## 2018-11-15 IMAGING — CR DG CHEST 1V PORT
1 series · 1 of 1 positions shown · non-contrast
Comparison: 01/10/2015 CXR

CLINICAL DATA: Patient found on floor.  Nonverbal and confused.

EXAM:
PORTABLE CHEST 1 VIEW

[portable]
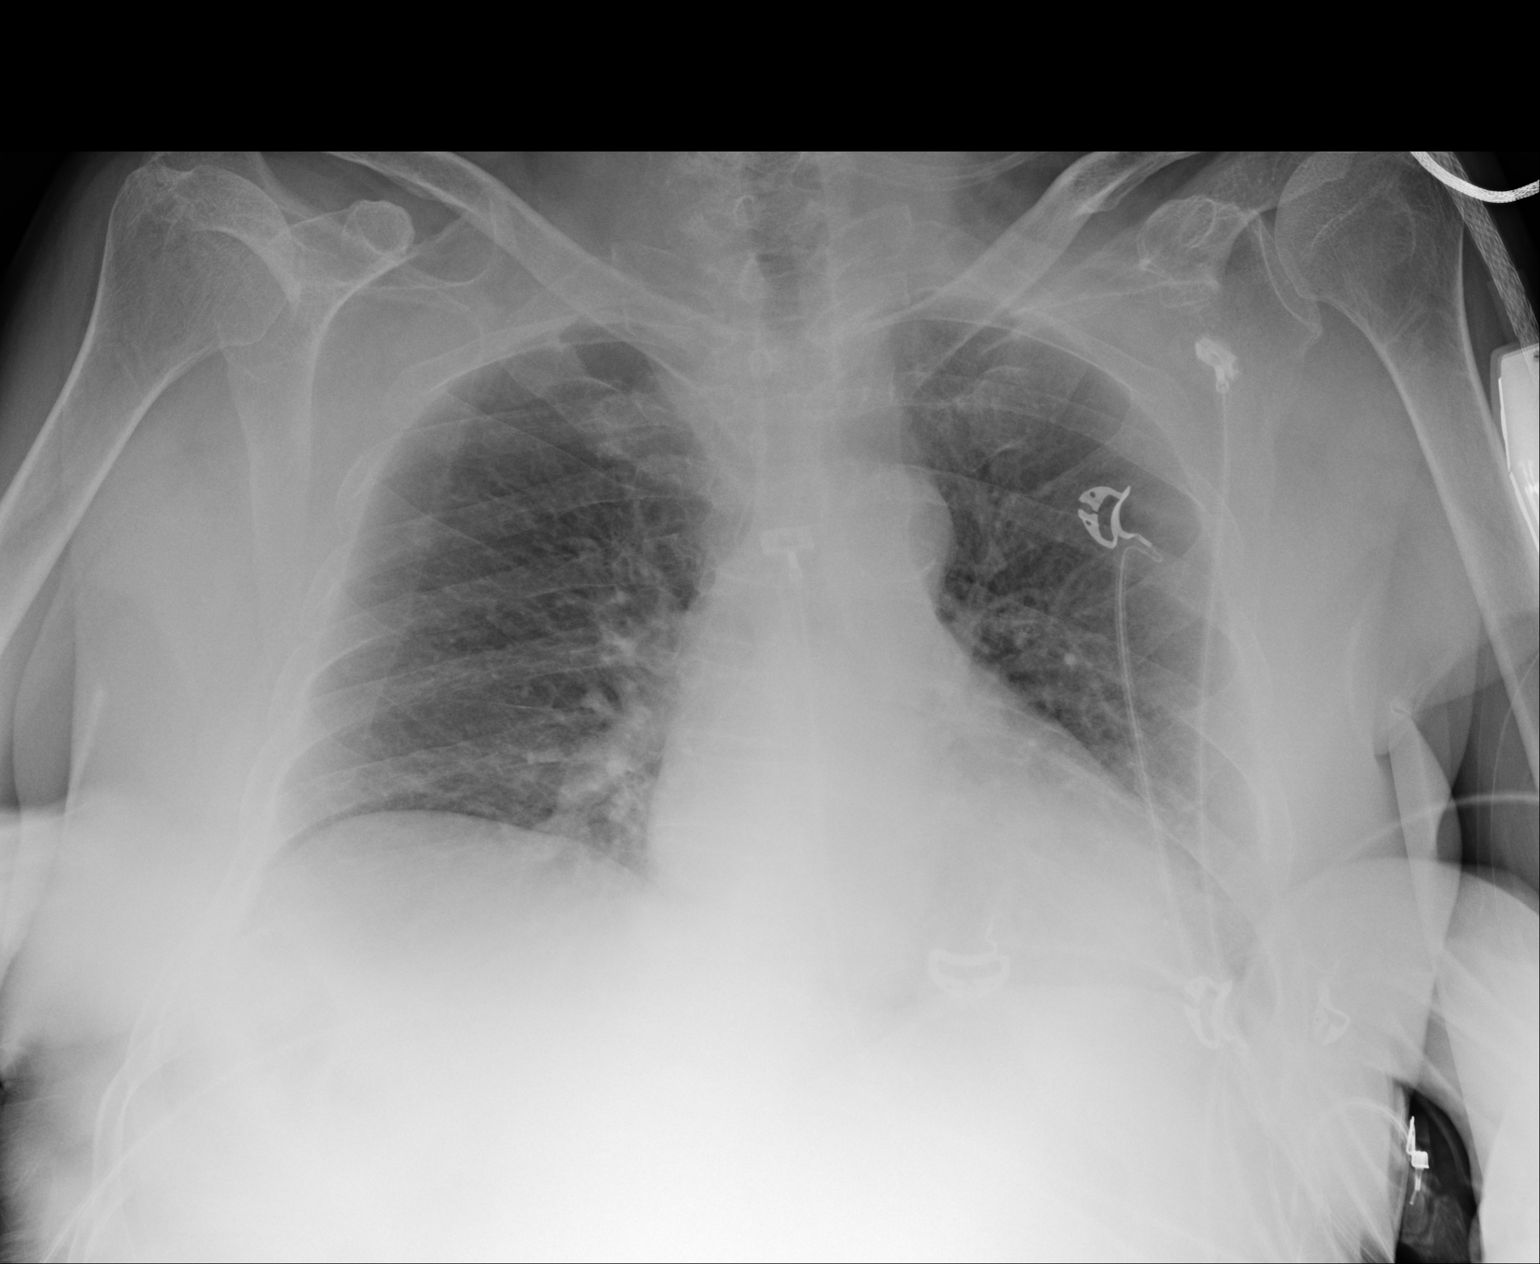

[1 of 1 positions shown; findings below may reference images not displayed]

FINDINGS: Stable borderline cardiomegaly with aortic atherosclerosis. Slightly
low lung volumes without pneumonic consolidation or CHF. No effusion
or pneumothorax. No suspicious osseous abnormality.
IMPRESSION: Stable borderline cardiomegaly with aortic atherosclerosis. No acute
pulmonary disease.

## 2018-11-16 IMAGING — MR MR HEAD W/O CM
7 of 9 series · 33 of 48 positions shown · non-contrast
Comparison: CT head 01/03/2016.  Most recent MR brain 01/11/2015.

CLINICAL DATA: Unresponsive. Altered mental status. Found down by
son, nonverbal and confused. History of stroke, hypertension, and
seizures. LEFT-sided weakness is reported.

EXAM:
MRI HEAD WITHOUT CONTRAST
TECHNIQUE: Multiplanar, multiecho pulse sequences of the brain and surrounding
structures were obtained without intravenous contrast.

[Series 3: DWI · axial · 3.0mm · 0.71mm/px · z∈[-54,+104]mm · 8 of 55 slices shown (1 of 4)]
[im 1/55]
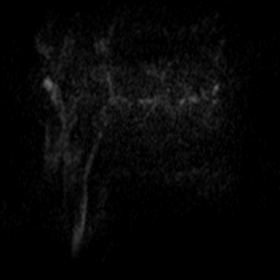
[im 8/55]
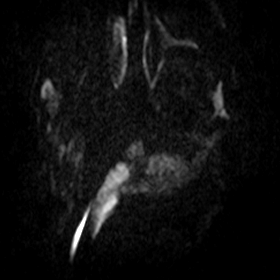
[im 16/55]
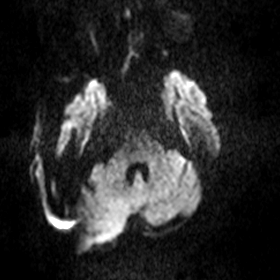
[im 24/55]
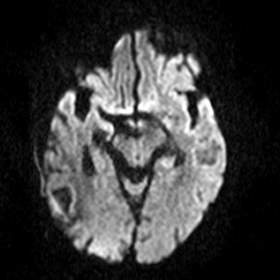
[im 31/55]
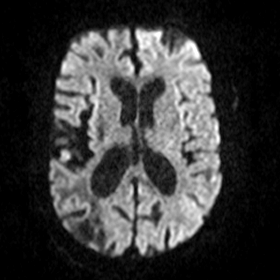
[im 39/55]
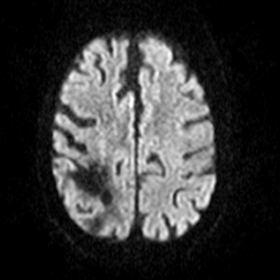
[im 47/55]
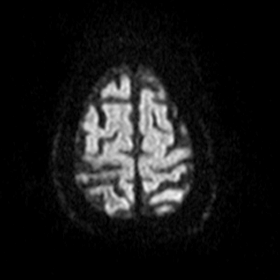
[im 55/55]
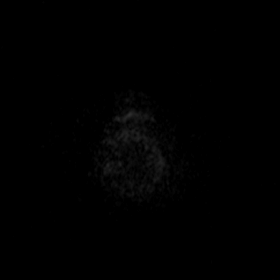

[Series 4: DWI · axial · 3.0mm · 0.71mm/px · z∈[-54,+104]mm · 7 of 54 slices shown (2 of 4)]
[im 1/54]
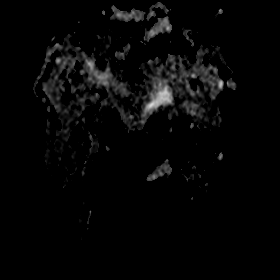
[im 9/54]
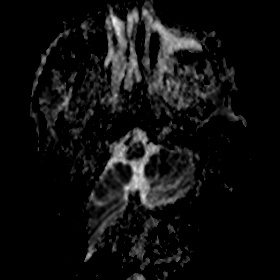
[im 18/54]
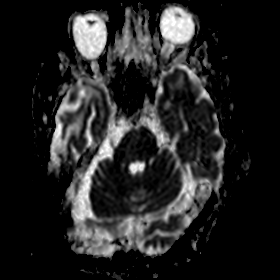
[im 27/54]
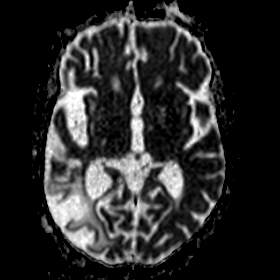
[im 36/54]
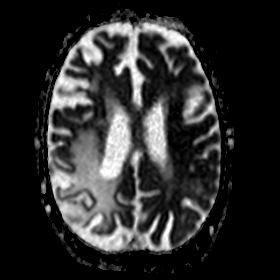
[im 45/54]
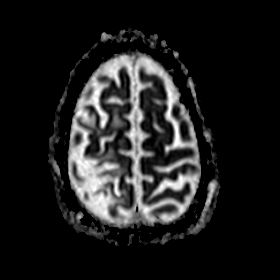
[im 54/54]
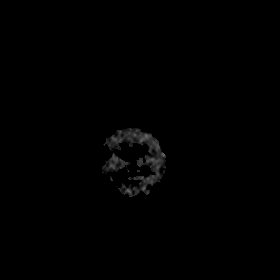

[Series 5: DWI · coronal · 5.0mm · 0.46mm/px · 5 of 36 slices shown (3 of 4)]
[im 1/36]
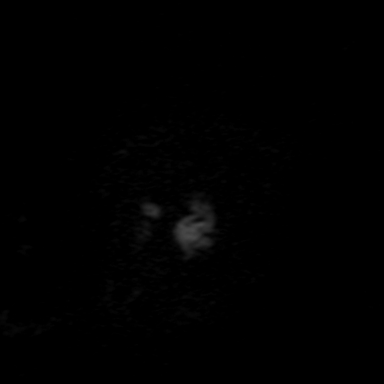
[im 9/36]
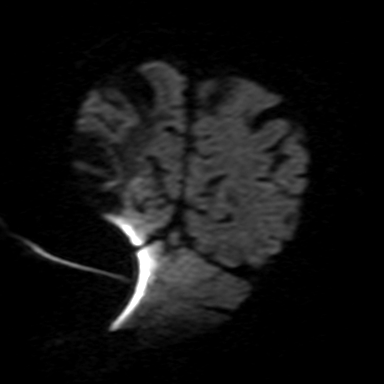
[im 18/36]
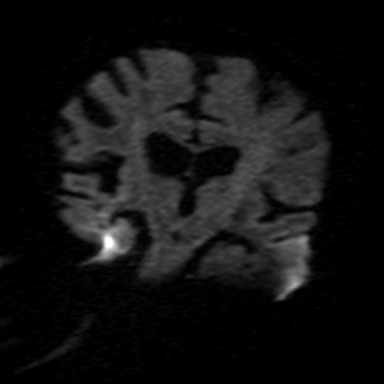
[im 27/36]
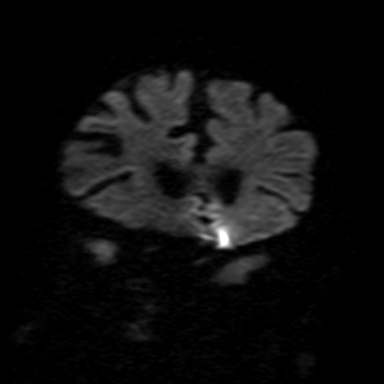
[im 36/36]
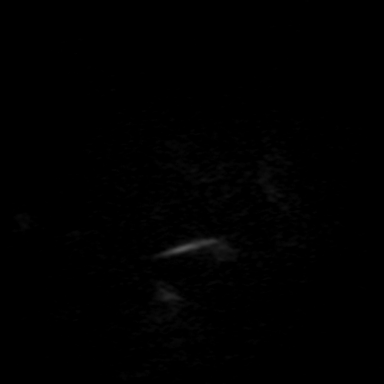

[Series 6: DWI · coronal · 5.0mm · 0.46mm/px · 5 of 36 slices shown (4 of 4)]
[im 1/36]
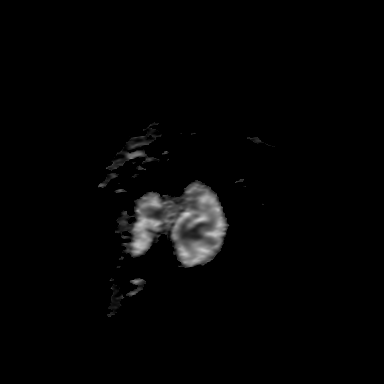
[im 9/36]
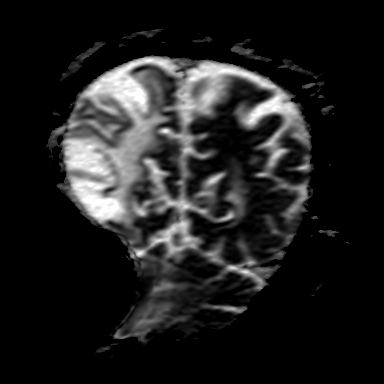
[im 18/36]
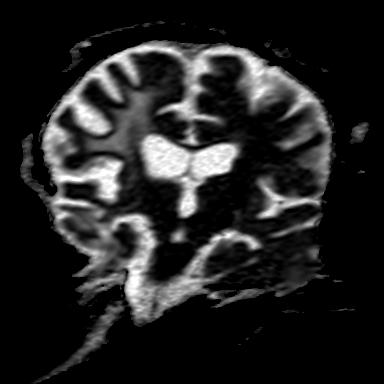
[im 27/36]
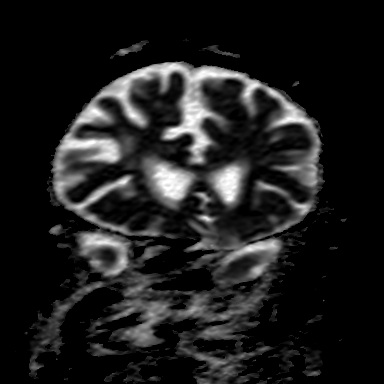
[im 36/36]
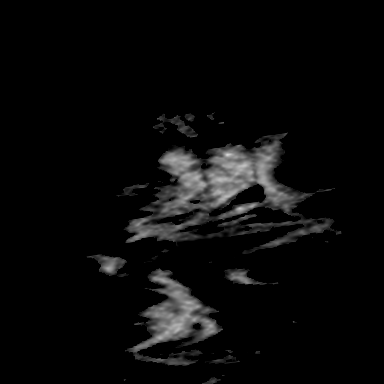

[Series 7: T1 · sagittal · 5.0mm · 0.44mm/px · 2 of 21 slices shown]
[im 1/21]
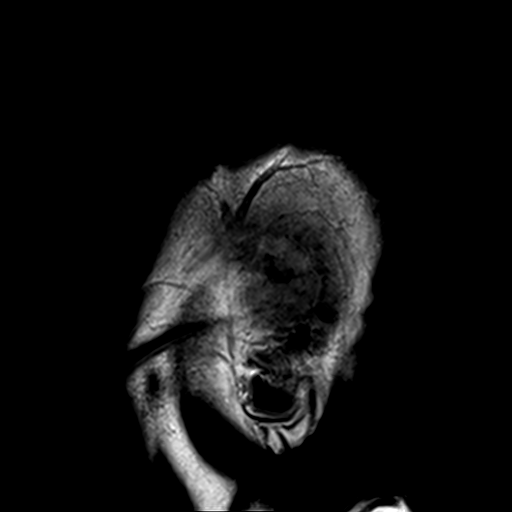
[im 11/21]
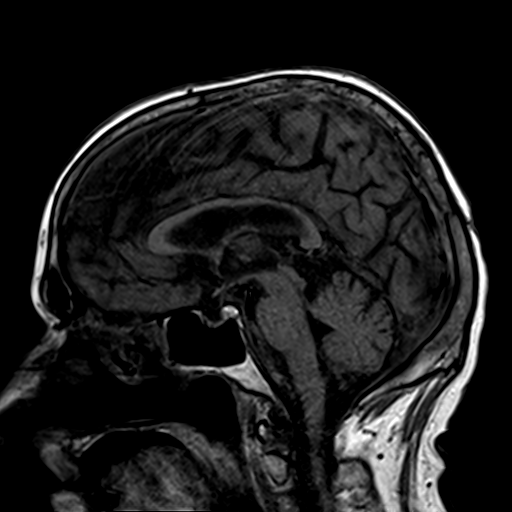

[Series 8: T2 · axial · 5.0mm · 0.50mm/px · z∈[-42,+97]mm · 3 of 23 slices shown]
[im 1/23]
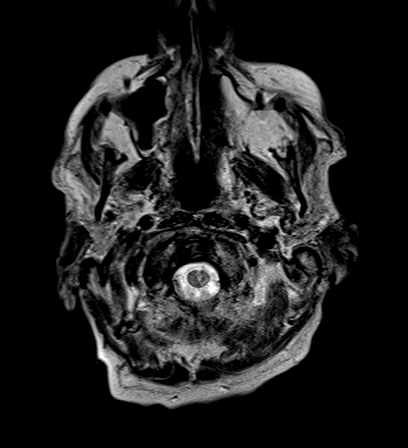
[im 12/23]
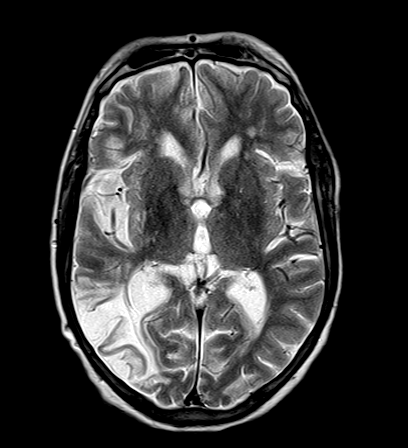
[im 23/23]
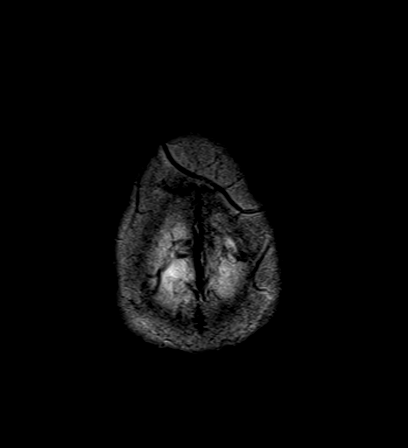

[Series 9: FLAIR · axial · 5.0mm · 0.36mm/px · z∈[-40,+100]mm · 3 of 23 slices shown]
[im 1/23]
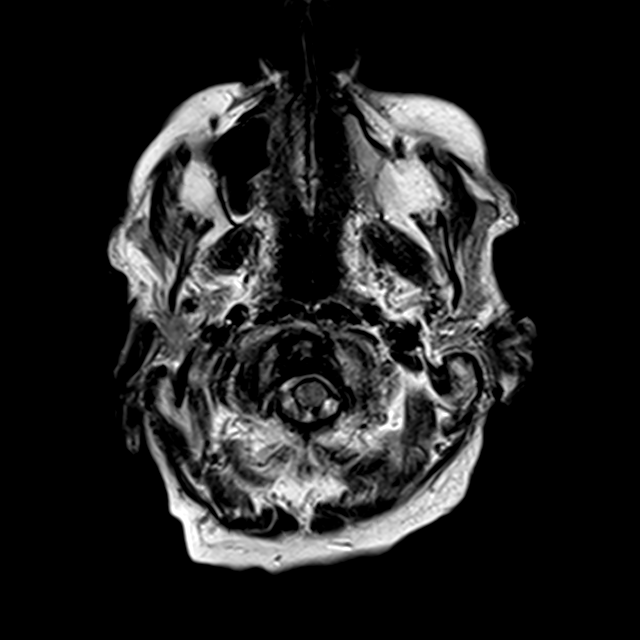
[im 12/23]
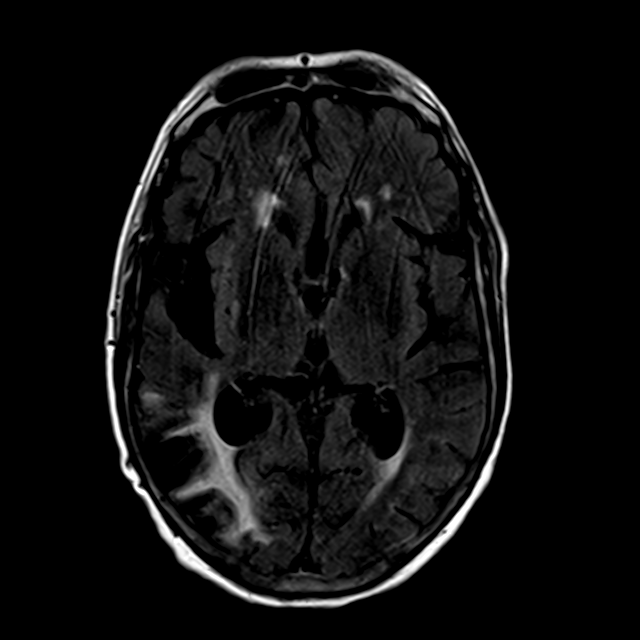
[im 23/23]
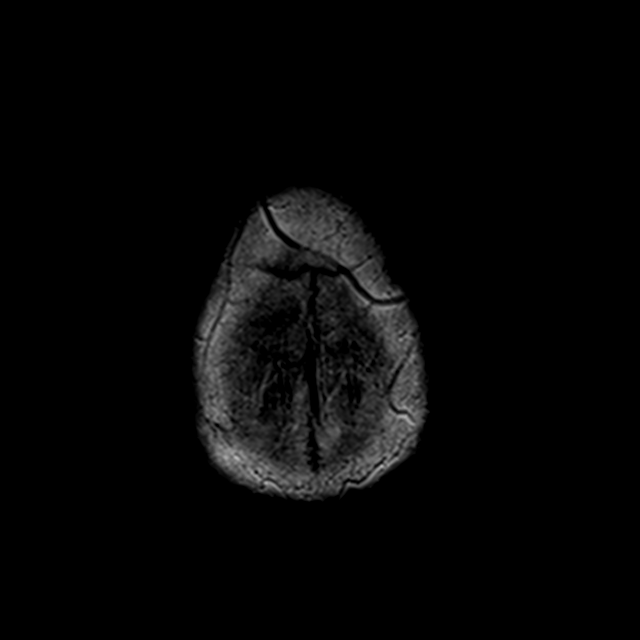

[33 of 48 positions shown; findings below may reference images not displayed]

FINDINGS: Motion and susceptibility artifact limits diagnostic accuracy. Small
or subtle lesions could be overlooked.

Brain: No evidence for acute infarction, hemorrhage, mass lesion, or
extra-axial fluid. Generalized atrophy. Chronic microvascular
ischemic change. Large remote RIGHT MCA territory infarct, affecting
the frontal, parietal, and temporal lobes as well as the insula and
basal ganglia. Small chronic LEFT cerebellar infarcts. Hydrocephalus
ex vacuo.

Vascular: Flow voids are maintained. Chronic hemorrhage accompanies
the old RIGHT-sided infarct, with hemosiderin standing of the gyri
as well as deep within the area of encephalomalacia. This localized
superficial siderosis could contribute to focal or generalized
seizures.

Skull and upper cervical spine: Normal marrow signal.

Sinuses/Orbits: Chronically inspissated secretions and atelectasis
of the LEFT maxillary sinus. Mild mucosal thickening elsewhere.

Other: None.
IMPRESSION: Markedly motion degraded study demonstrating no acute intracranial
findings. Specifically, no acute RIGHT hemisphere infarct or
gyriform restricted diffusion.

Remote RIGHT hemisphere infarct with chronic hemorrhage and
superficial siderosis is the dominant finding. In the appropriate
setting, LEFT-sided weakness could result from postictal phenomenon.

## 2019-05-16 IMAGING — DX DG CHEST 1V
1 series · 1 of 1 positions shown · non-contrast
Comparison: 01/03/2016

CLINICAL DATA: Seizure

EXAM:
CHEST 1 VIEW

[chest ap]
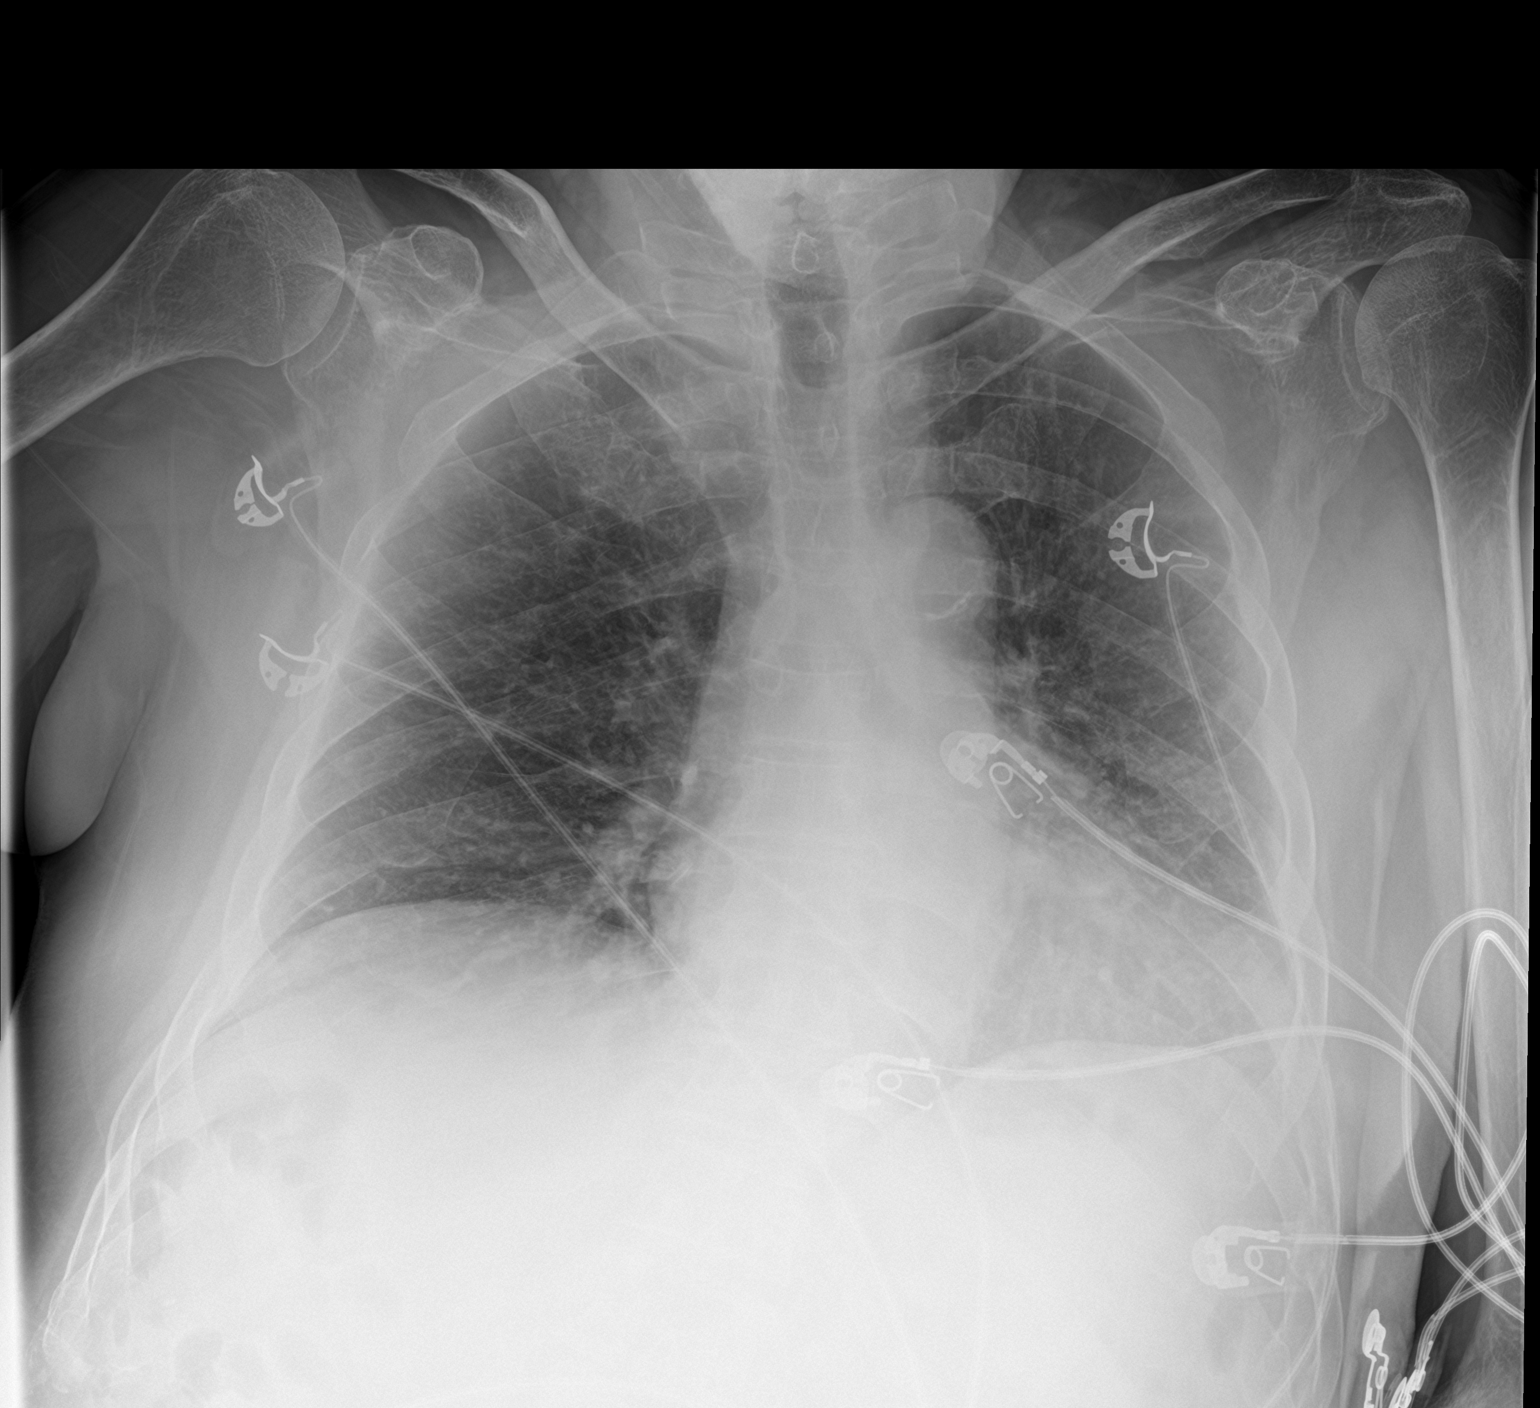

[1 of 1 positions shown; findings below may reference images not displayed]

FINDINGS: Borderline to mild cardiomegaly. No edema. No pleural effusion or
focal pulmonary infiltrate. Aortic atherosclerosis. No pneumothorax.
IMPRESSION: Borderline to mild cardiomegaly.  No edema or infiltrate
